# Patient Record
Sex: Female | Born: 1935 | Race: White | Hispanic: No | Smoking: Former smoker
Health system: Southern US, Community
[De-identification: ages and names within clinical notes are randomized; demographics above are authoritative.]

## PROBLEM LIST (undated history)

## (undated) DIAGNOSIS — E559 Vitamin D deficiency, unspecified: Secondary | ICD-10-CM

## (undated) DIAGNOSIS — I739 Peripheral vascular disease, unspecified: Secondary | ICD-10-CM

## (undated) DIAGNOSIS — R32 Unspecified urinary incontinence: Secondary | ICD-10-CM

## (undated) DIAGNOSIS — I350 Nonrheumatic aortic (valve) stenosis: Secondary | ICD-10-CM

## (undated) DIAGNOSIS — R233 Spontaneous ecchymoses: Secondary | ICD-10-CM

## (undated) DIAGNOSIS — I6529 Occlusion and stenosis of unspecified carotid artery: Secondary | ICD-10-CM

## (undated) DIAGNOSIS — E039 Hypothyroidism, unspecified: Secondary | ICD-10-CM

## (undated) DIAGNOSIS — C44311 Basal cell carcinoma of skin of nose: Secondary | ICD-10-CM

## (undated) DIAGNOSIS — I714 Abdominal aortic aneurysm, without rupture, unspecified: Secondary | ICD-10-CM

## (undated) DIAGNOSIS — K08109 Complete loss of teeth, unspecified cause, unspecified class: Secondary | ICD-10-CM

## (undated) DIAGNOSIS — K439 Ventral hernia without obstruction or gangrene: Secondary | ICD-10-CM

## (undated) DIAGNOSIS — R238 Other skin changes: Secondary | ICD-10-CM

## (undated) DIAGNOSIS — I219 Acute myocardial infarction, unspecified: Secondary | ICD-10-CM

## (undated) DIAGNOSIS — I701 Atherosclerosis of renal artery: Secondary | ICD-10-CM

## (undated) DIAGNOSIS — H269 Unspecified cataract: Secondary | ICD-10-CM

## (undated) DIAGNOSIS — Z972 Presence of dental prosthetic device (complete) (partial): Secondary | ICD-10-CM

## (undated) DIAGNOSIS — K219 Gastro-esophageal reflux disease without esophagitis: Secondary | ICD-10-CM

## (undated) DIAGNOSIS — E785 Hyperlipidemia, unspecified: Secondary | ICD-10-CM

## (undated) DIAGNOSIS — F329 Major depressive disorder, single episode, unspecified: Secondary | ICD-10-CM

## (undated) DIAGNOSIS — I2510 Atherosclerotic heart disease of native coronary artery without angina pectoris: Secondary | ICD-10-CM

## (undated) DIAGNOSIS — N189 Chronic kidney disease, unspecified: Secondary | ICD-10-CM

## (undated) DIAGNOSIS — I10 Essential (primary) hypertension: Secondary | ICD-10-CM

## (undated) HISTORY — DX: Unspecified urinary incontinence: R32

## (undated) HISTORY — DX: Gastro-esophageal reflux disease without esophagitis: K21.9

## (undated) HISTORY — DX: Essential (primary) hypertension: I10

## (undated) HISTORY — DX: Presence of dental prosthetic device (complete) (partial): K08.109

## (undated) HISTORY — DX: Other skin changes: R23.8

## (undated) HISTORY — DX: Abdominal aortic aneurysm, without rupture: I71.4

## (undated) HISTORY — DX: Vitamin D deficiency, unspecified: E55.9

## (undated) HISTORY — DX: Ventral hernia without obstruction or gangrene: K43.9

## (undated) HISTORY — DX: Peripheral vascular disease, unspecified: I73.9

## (undated) HISTORY — PX: BREAST SURGERY: SHX581

## (undated) HISTORY — DX: Chronic kidney disease, unspecified: N18.9

## (undated) HISTORY — PX: ANGIOPLASTY: SHX39

## (undated) HISTORY — DX: Atherosclerosis of renal artery: I70.1

## (undated) HISTORY — DX: Acute myocardial infarction, unspecified: I21.9

## (undated) HISTORY — DX: Major depressive disorder, single episode, unspecified: F32.9

## (undated) HISTORY — PX: ILIAC ARTERY STENT: SHX1786

## (undated) HISTORY — PX: CORONARY STENT PLACEMENT: SHX1402

## (undated) HISTORY — DX: Hyperlipidemia, unspecified: E78.5

## (undated) HISTORY — DX: Nonrheumatic aortic (valve) stenosis: I35.0

## (undated) HISTORY — DX: Hypothyroidism, unspecified: E03.9

## (undated) HISTORY — DX: Spontaneous ecchymoses: R23.3

## (undated) HISTORY — DX: Unspecified cataract: H26.9

## (undated) HISTORY — DX: Atherosclerotic heart disease of native coronary artery without angina pectoris: I25.10

## (undated) HISTORY — DX: Basal cell carcinoma of skin of nose: C44.311

## (undated) HISTORY — DX: Abdominal aortic aneurysm, without rupture, unspecified: I71.40

## (undated) HISTORY — PX: ABDOMINAL AORTIC ANEURYSM REPAIR: SUR1152

## (undated) HISTORY — PX: CATARACT EXTRACTION, BILATERAL: SHX1313

## (undated) HISTORY — DX: Presence of dental prosthetic device (complete) (partial): Z97.2

## (undated) HISTORY — DX: Occlusion and stenosis of unspecified carotid artery: I65.29

---
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
## 1985-07-12 HISTORY — PX: CHOLECYSTECTOMY: SHX55

## 1994-07-12 HISTORY — PX: OTHER SURGICAL HISTORY: SHX169

## 1995-07-13 HISTORY — PX: HERNIA REPAIR: SHX51

## 2004-07-12 DIAGNOSIS — F32A Depression, unspecified: Secondary | ICD-10-CM

## 2004-07-12 HISTORY — DX: Depression, unspecified: F32.A

## 2005-04-11 HISTORY — PX: OTHER SURGICAL HISTORY: SHX169

## 2005-04-28 ENCOUNTER — Emergency Department (HOSPITAL_COMMUNITY): Admission: EM | Admit: 2005-04-28 | Discharge: 2005-04-28 | Payer: Self-pay | Admitting: Emergency Medicine

## 2005-05-04 ENCOUNTER — Ambulatory Visit: Payer: Self-pay | Admitting: Internal Medicine

## 2005-05-05 ENCOUNTER — Ambulatory Visit: Payer: Self-pay | Admitting: Family Medicine

## 2005-05-05 ENCOUNTER — Inpatient Hospital Stay (HOSPITAL_COMMUNITY): Admission: EM | Admit: 2005-05-05 | Discharge: 2005-05-12 | Payer: Self-pay | Admitting: Emergency Medicine

## 2005-05-06 ENCOUNTER — Encounter (INDEPENDENT_AMBULATORY_CARE_PROVIDER_SITE_OTHER): Payer: Self-pay | Admitting: *Deleted

## 2005-05-18 ENCOUNTER — Ambulatory Visit: Payer: Self-pay | Admitting: Gastroenterology

## 2005-05-19 ENCOUNTER — Ambulatory Visit: Payer: Self-pay | Admitting: Gastroenterology

## 2005-05-19 ENCOUNTER — Encounter (INDEPENDENT_AMBULATORY_CARE_PROVIDER_SITE_OTHER): Payer: Self-pay | Admitting: *Deleted

## 2005-05-19 DIAGNOSIS — K559 Vascular disorder of intestine, unspecified: Secondary | ICD-10-CM | POA: Insufficient documentation

## 2005-05-28 ENCOUNTER — Ambulatory Visit: Payer: Self-pay | Admitting: Family Medicine

## 2005-07-01 HISTORY — PX: COLONOSCOPY W/ ENDOSCOPIC US: SHX1379

## 2005-07-01 HISTORY — PX: CHOLECYSTECTOMY: SHX55

## 2005-07-07 HISTORY — PX: COLONOSCOPY: SHX174

## 2005-07-07 HISTORY — PX: UPPER GASTROINTESTINAL ENDOSCOPY: SHX188

## 2005-07-12 DIAGNOSIS — I219 Acute myocardial infarction, unspecified: Secondary | ICD-10-CM

## 2005-07-12 HISTORY — DX: Acute myocardial infarction, unspecified: I21.9

## 2005-07-12 HISTORY — PX: CORONARY STENT PLACEMENT: SHX1402

## 2005-07-26 ENCOUNTER — Ambulatory Visit: Payer: Self-pay | Admitting: Cardiology

## 2005-07-27 ENCOUNTER — Inpatient Hospital Stay (HOSPITAL_COMMUNITY): Admission: EM | Admit: 2005-07-27 | Discharge: 2005-07-31 | Payer: Self-pay | Admitting: Emergency Medicine

## 2005-07-27 ENCOUNTER — Ambulatory Visit: Payer: Self-pay | Admitting: Sports Medicine

## 2005-08-13 ENCOUNTER — Ambulatory Visit: Payer: Self-pay | Admitting: Cardiology

## 2005-08-13 ENCOUNTER — Ambulatory Visit: Payer: Self-pay | Admitting: Family Medicine

## 2005-08-13 HISTORY — PX: OTHER SURGICAL HISTORY: SHX169

## 2005-09-07 ENCOUNTER — Encounter: Payer: Self-pay | Admitting: Cardiology

## 2005-09-09 ENCOUNTER — Encounter: Payer: Self-pay | Admitting: Cardiology

## 2005-10-10 ENCOUNTER — Encounter: Payer: Self-pay | Admitting: Cardiology

## 2005-10-21 ENCOUNTER — Ambulatory Visit: Payer: Self-pay | Admitting: Family Medicine

## 2005-11-05 ENCOUNTER — Encounter: Payer: Self-pay | Admitting: Cardiology

## 2005-11-05 ENCOUNTER — Ambulatory Visit: Payer: Self-pay

## 2005-11-09 ENCOUNTER — Encounter: Payer: Self-pay | Admitting: Cardiology

## 2005-11-12 ENCOUNTER — Ambulatory Visit: Payer: Self-pay | Admitting: Family Medicine

## 2005-11-15 ENCOUNTER — Ambulatory Visit: Payer: Self-pay | Admitting: Internal Medicine

## 2005-11-23 ENCOUNTER — Ambulatory Visit: Payer: Self-pay | Admitting: Sports Medicine

## 2005-11-23 ENCOUNTER — Encounter: Admission: RE | Admit: 2005-11-23 | Discharge: 2005-11-23 | Payer: Self-pay | Admitting: Sports Medicine

## 2005-12-08 ENCOUNTER — Ambulatory Visit: Payer: Self-pay | Admitting: Family Medicine

## 2005-12-10 ENCOUNTER — Encounter: Payer: Self-pay | Admitting: Cardiology

## 2005-12-23 ENCOUNTER — Ambulatory Visit: Payer: Self-pay | Admitting: Family Medicine

## 2006-01-09 HISTORY — PX: OTHER SURGICAL HISTORY: SHX169

## 2006-01-25 ENCOUNTER — Inpatient Hospital Stay (HOSPITAL_COMMUNITY): Admission: EM | Admit: 2006-01-25 | Discharge: 2006-01-28 | Payer: Self-pay | Admitting: Emergency Medicine

## 2006-01-25 ENCOUNTER — Ambulatory Visit: Payer: Self-pay | Admitting: Family Medicine

## 2006-02-02 ENCOUNTER — Ambulatory Visit: Payer: Self-pay | Admitting: Family Medicine

## 2006-03-15 ENCOUNTER — Ambulatory Visit: Payer: Self-pay | Admitting: Family Medicine

## 2006-04-13 ENCOUNTER — Ambulatory Visit: Payer: Self-pay | Admitting: Internal Medicine

## 2006-05-24 ENCOUNTER — Ambulatory Visit: Payer: Self-pay

## 2006-06-08 ENCOUNTER — Ambulatory Visit: Payer: Self-pay | Admitting: Family Medicine

## 2006-07-06 ENCOUNTER — Ambulatory Visit: Payer: Self-pay | Admitting: Internal Medicine

## 2006-10-12 ENCOUNTER — Ambulatory Visit: Payer: Self-pay | Admitting: Internal Medicine

## 2006-10-12 ENCOUNTER — Encounter: Payer: Self-pay | Admitting: Cardiology

## 2006-10-12 ENCOUNTER — Ambulatory Visit: Payer: Self-pay

## 2006-10-12 LAB — CONVERTED CEMR LAB
Albumin: 3.8 g/dL (ref 3.5–5.2)
Alkaline Phosphatase: 75 units/L (ref 39–117)
BUN: 26 mg/dL — ABNORMAL HIGH (ref 6–23)
CO2: 29 meq/L (ref 19–32)
Chloride: 110 meq/L (ref 96–112)
Cholesterol: 251 mg/dL (ref 0–200)
Creatinine, Ser: 1.4 mg/dL — ABNORMAL HIGH (ref 0.4–1.2)
Direct LDL: 63.8 mg/dL
GFR calc Af Amer: 47 mL/min
Glucose, Bld: 94 mg/dL (ref 70–99)
HDL: 46.9 mg/dL (ref >39.0)
Potassium: 5.3 meq/L — ABNORMAL HIGH (ref 3.5–5.1)
Sodium: 144 meq/L (ref 135–145)
Total Bilirubin: 0.9 mg/dL (ref 0.3–1.2)
Total Protein: 6.6 g/dL (ref 6.0–8.3)
VLDL: 63 mg/dL — ABNORMAL HIGH (ref 0–40)

## 2006-10-20 ENCOUNTER — Ambulatory Visit: Payer: Self-pay | Admitting: Family Medicine

## 2006-10-20 LAB — CONVERTED CEMR LAB
BUN: 26 mg/dL — ABNORMAL HIGH (ref 6–23)
CO2: 30 meq/L (ref 19–32)
Calcium: 9.6 mg/dL (ref 8.4–10.5)
Chloride: 109 meq/L (ref 96–112)
Creatinine, Ser: 1.3 mg/dL — ABNORMAL HIGH (ref 0.4–1.2)
GFR calc Af Amer: 51 mL/min
GFR calc non Af Amer: 42 mL/min
Glucose, Bld: 135 mg/dL — ABNORMAL HIGH (ref 70–99)
Potassium: 4.9 meq/L (ref 3.5–5.1)
Sodium: 144 meq/L (ref 135–145)

## 2006-11-29 ENCOUNTER — Encounter (INDEPENDENT_AMBULATORY_CARE_PROVIDER_SITE_OTHER): Payer: Self-pay | Admitting: *Deleted

## 2007-01-09 ENCOUNTER — Telehealth: Payer: Self-pay | Admitting: Family Medicine

## 2007-02-17 ENCOUNTER — Encounter: Payer: Self-pay | Admitting: Family Medicine

## 2007-02-17 DIAGNOSIS — E785 Hyperlipidemia, unspecified: Secondary | ICD-10-CM | POA: Insufficient documentation

## 2007-02-17 DIAGNOSIS — N183 Chronic kidney disease, stage 3 (moderate): Secondary | ICD-10-CM

## 2007-02-17 DIAGNOSIS — I10 Essential (primary) hypertension: Secondary | ICD-10-CM

## 2007-02-17 DIAGNOSIS — E039 Hypothyroidism, unspecified: Secondary | ICD-10-CM

## 2007-02-17 DIAGNOSIS — I739 Peripheral vascular disease, unspecified: Secondary | ICD-10-CM

## 2007-02-17 DIAGNOSIS — K219 Gastro-esophageal reflux disease without esophagitis: Secondary | ICD-10-CM

## 2007-02-20 ENCOUNTER — Ambulatory Visit: Payer: Self-pay | Admitting: Family Medicine

## 2007-02-20 DIAGNOSIS — R159 Full incontinence of feces: Secondary | ICD-10-CM | POA: Insufficient documentation

## 2007-02-20 DIAGNOSIS — N812 Incomplete uterovaginal prolapse: Secondary | ICD-10-CM

## 2007-02-21 LAB — CONVERTED CEMR LAB
ALT: 16 U/L
AST: 18 U/L
Albumin: 3.8 g/dL
Alkaline Phosphatase: 65 U/L
BUN: 26 mg/dL — ABNORMAL HIGH
Bilirubin, Direct: 0.1 mg/dL
CO2: 28 meq/L
Calcium: 9.4 mg/dL
Chloride: 106 meq/L
Creatinine, Ser: 1.4 mg/dL — ABNORMAL HIGH
GFR calc Af Amer: 47 mL/min
GFR calc non Af Amer: 39 mL/min
Glucose, Bld: 102 mg/dL — ABNORMAL HIGH
Potassium: 4.7 meq/L
Sodium: 141 meq/L
TSH: 0.92 u[IU]/mL
Total Bilirubin: 1.1 mg/dL
Total Protein: 6.7 g/dL

## 2007-03-06 ENCOUNTER — Encounter: Payer: Self-pay | Admitting: Family Medicine

## 2007-04-10 ENCOUNTER — Telehealth: Payer: Self-pay | Admitting: Family Medicine

## 2007-05-22 ENCOUNTER — Ambulatory Visit: Payer: Self-pay | Admitting: Family Medicine

## 2007-05-23 ENCOUNTER — Ambulatory Visit: Payer: Self-pay

## 2007-06-14 ENCOUNTER — Encounter: Payer: Self-pay | Admitting: Family Medicine

## 2007-06-23 ENCOUNTER — Ambulatory Visit: Payer: Self-pay | Admitting: Internal Medicine

## 2007-08-18 ENCOUNTER — Ambulatory Visit: Payer: Self-pay

## 2007-08-24 ENCOUNTER — Encounter: Payer: Self-pay | Admitting: Family Medicine

## 2007-08-25 ENCOUNTER — Ambulatory Visit: Payer: Self-pay | Admitting: Family Medicine

## 2007-08-28 LAB — CONVERTED CEMR LAB
ALT: 16 units/L (ref 0–35)
AST: 18 units/L (ref 0–37)
Albumin: 3.7 g/dL (ref 3.5–5.2)
Alkaline Phosphatase: 66 units/L (ref 39–117)
BUN: 24 mg/dL — ABNORMAL HIGH (ref 6–23)
Bilirubin, Direct: 0.1 mg/dL (ref 0.0–0.3)
CO2: 30 meq/L (ref 19–32)
Calcium: 9.5 mg/dL (ref 8.4–10.5)
Chloride: 107 meq/L (ref 96–112)
Direct LDL: 71.8 mg/dL
GFR calc Af Amer: 56 mL/min
GFR calc non Af Amer: 46 mL/min
Glucose, Bld: 99 mg/dL (ref 70–99)
HDL: 46.1 mg/dL (ref >39.0)
Hemoglobin: 13.2 g/dL (ref 12.0–15.0)
Phosphorus: 3.5 mg/dL (ref 2.3–4.6)
Potassium: 4.2 meq/L (ref 3.5–5.1)
Sodium: 143 meq/L (ref 135–145)
Total Bilirubin: 0.9 mg/dL (ref 0.3–1.2)
Total CHOL/HDL Ratio: 6
Total Protein: 6.2 g/dL (ref 6.0–8.3)
Triglycerides: 278 mg/dL (ref 0–149)
VLDL: 56 mg/dL — ABNORMAL HIGH (ref 0–40)

## 2007-11-22 ENCOUNTER — Ambulatory Visit: Payer: Self-pay

## 2007-12-20 ENCOUNTER — Ambulatory Visit: Payer: Self-pay | Admitting: Family Medicine

## 2007-12-20 DIAGNOSIS — M549 Dorsalgia, unspecified: Secondary | ICD-10-CM | POA: Insufficient documentation

## 2007-12-20 LAB — CONVERTED CEMR LAB
Bilirubin Urine: NEGATIVE
Glucose, Urine, Semiquant: NEGATIVE
Ketones, urine, test strip: NEGATIVE
Nitrite: NEGATIVE
Specific Gravity, Urine: 1.02
Urobilinogen, UA: 0.2
pH: 5

## 2007-12-21 ENCOUNTER — Encounter: Payer: Self-pay | Admitting: Family Medicine

## 2007-12-25 ENCOUNTER — Telehealth: Payer: Self-pay | Admitting: Family Medicine

## 2008-01-16 ENCOUNTER — Ambulatory Visit: Payer: Self-pay | Admitting: Family Medicine

## 2008-01-16 DIAGNOSIS — R1031 Right lower quadrant pain: Secondary | ICD-10-CM

## 2008-01-17 ENCOUNTER — Encounter: Payer: Self-pay | Admitting: Family Medicine

## 2008-01-19 ENCOUNTER — Encounter: Payer: Self-pay | Admitting: Family Medicine

## 2008-01-19 LAB — CONVERTED CEMR LAB
BUN: 24 mg/dL — ABNORMAL HIGH (ref 6–23)
Basophils Absolute: 0.1 10*3/uL (ref 0.0–0.1)
CO2: 28 meq/L (ref 19–32)
Chloride: 104 meq/L (ref 96–112)
Eosinophils Absolute: 0.1 10*3/uL (ref 0.0–0.7)
Eosinophils Relative: 1.8 % (ref 0.0–5.0)
GFR calc Af Amer: 51 mL/min
GFR calc non Af Amer: 42 mL/min
Lymphocytes Relative: 23.2 % (ref 12.0–46.0)
MCHC: 35.1 g/dL (ref 30.0–36.0)
MCV: 87.2 fL (ref 78.0–100.0)
Monocytes Absolute: 0.5 10*3/uL (ref 0.1–1.0)
Neutrophils Relative %: 66.6 % (ref 43.0–77.0)
Platelets: 146 10*3/uL — ABNORMAL LOW (ref 150–400)
Potassium: 5 meq/L (ref 3.5–5.1)
RDW: 13.9 % (ref 11.5–14.6)
Sodium: 140 meq/L (ref 135–145)

## 2008-01-25 ENCOUNTER — Telehealth: Payer: Self-pay | Admitting: Family Medicine

## 2008-01-31 ENCOUNTER — Encounter: Payer: Self-pay | Admitting: Family Medicine

## 2008-02-01 DIAGNOSIS — J984 Other disorders of lung: Secondary | ICD-10-CM | POA: Insufficient documentation

## 2008-02-02 ENCOUNTER — Ambulatory Visit: Payer: Self-pay | Admitting: Family Medicine

## 2008-02-15 ENCOUNTER — Encounter: Payer: Self-pay | Admitting: Family Medicine

## 2008-02-16 ENCOUNTER — Telehealth: Payer: Self-pay | Admitting: Family Medicine

## 2008-03-12 ENCOUNTER — Encounter: Payer: Self-pay | Admitting: Family Medicine

## 2008-03-13 ENCOUNTER — Telehealth: Payer: Self-pay | Admitting: Family Medicine

## 2008-03-29 ENCOUNTER — Encounter: Payer: Self-pay | Admitting: Family Medicine

## 2008-04-18 ENCOUNTER — Telehealth: Payer: Self-pay | Admitting: Family Medicine

## 2008-05-16 ENCOUNTER — Ambulatory Visit: Payer: Self-pay | Admitting: Family Medicine

## 2008-05-16 DIAGNOSIS — K439 Ventral hernia without obstruction or gangrene: Secondary | ICD-10-CM | POA: Insufficient documentation

## 2008-05-31 ENCOUNTER — Ambulatory Visit: Payer: Self-pay | Admitting: Cardiology

## 2008-06-03 ENCOUNTER — Ambulatory Visit: Payer: Self-pay | Admitting: Internal Medicine

## 2008-06-03 ENCOUNTER — Ambulatory Visit: Payer: Self-pay | Admitting: Cardiology

## 2008-06-11 ENCOUNTER — Ambulatory Visit (HOSPITAL_COMMUNITY): Admission: RE | Admit: 2008-06-11 | Discharge: 2008-06-11 | Payer: Self-pay | Admitting: Internal Medicine

## 2008-06-11 ENCOUNTER — Ambulatory Visit: Payer: Self-pay | Admitting: Internal Medicine

## 2008-06-17 ENCOUNTER — Ambulatory Visit: Payer: Self-pay | Admitting: Family Medicine

## 2008-06-18 ENCOUNTER — Encounter: Payer: Self-pay | Admitting: Family Medicine

## 2008-06-18 LAB — CONVERTED CEMR LAB
ALT: 18 units/L (ref 0–35)
AST: 20 units/L (ref 0–37)
Albumin: 3.8 g/dL (ref 3.5–5.2)
Alkaline Phosphatase: 61 units/L (ref 39–117)
BUN: 16 mg/dL (ref 6–23)
Chloride: 107 meq/L (ref 96–112)
Cholesterol: 285 mg/dL (ref 0–200)
Creatinine, Ser: 1.5 mg/dL — ABNORMAL HIGH (ref 0.4–1.2)
Direct LDL: 93.2 mg/dL
GFR calc Af Amer: 43 mL/min
GFR calc non Af Amer: 36 mL/min
Glucose, Bld: 104 mg/dL — ABNORMAL HIGH (ref 70–99)
HDL: 42.7 mg/dL (ref >39.0)
Sodium: 142 meq/L (ref 135–145)
Total Bilirubin: 0.9 mg/dL (ref 0.3–1.2)
Total CHOL/HDL Ratio: 6.7
Total Protein: 6.8 g/dL (ref 6.0–8.3)
Triglycerides: 437 mg/dL (ref 0–149)
VLDL: 87 mg/dL — ABNORMAL HIGH (ref 0–40)

## 2008-06-21 ENCOUNTER — Ambulatory Visit: Payer: Self-pay | Admitting: Family Medicine

## 2008-06-21 DIAGNOSIS — I2510 Atherosclerotic heart disease of native coronary artery without angina pectoris: Secondary | ICD-10-CM

## 2008-06-21 DIAGNOSIS — R1013 Epigastric pain: Secondary | ICD-10-CM

## 2008-06-21 LAB — CONVERTED CEMR LAB
Cholesterol, target level: 200 mg/dL
HDL goal, serum: 40 mg/dL
LDL Goal: 100 mg/dL

## 2008-06-21 LAB — HM COLONOSCOPY

## 2008-06-24 LAB — CONVERTED CEMR LAB: Lipase: 33 units/L (ref 11.0–59.0)

## 2008-06-25 ENCOUNTER — Ambulatory Visit: Payer: Self-pay

## 2008-06-25 ENCOUNTER — Encounter: Payer: Self-pay | Admitting: Cardiology

## 2008-06-27 ENCOUNTER — Ambulatory Visit: Payer: Self-pay | Admitting: Internal Medicine

## 2008-07-08 ENCOUNTER — Encounter: Payer: Self-pay | Admitting: Family Medicine

## 2008-07-24 ENCOUNTER — Ambulatory Visit: Payer: Self-pay | Admitting: Family Medicine

## 2008-07-24 DIAGNOSIS — M545 Low back pain: Secondary | ICD-10-CM

## 2008-09-24 ENCOUNTER — Encounter (INDEPENDENT_AMBULATORY_CARE_PROVIDER_SITE_OTHER): Payer: Self-pay | Admitting: *Deleted

## 2008-09-24 DIAGNOSIS — I714 Abdominal aortic aneurysm, without rupture: Secondary | ICD-10-CM

## 2008-09-25 ENCOUNTER — Encounter: Payer: Self-pay | Admitting: Internal Medicine

## 2008-09-25 ENCOUNTER — Ambulatory Visit: Payer: Self-pay | Admitting: Internal Medicine

## 2008-09-25 DIAGNOSIS — I350 Nonrheumatic aortic (valve) stenosis: Secondary | ICD-10-CM

## 2008-09-27 ENCOUNTER — Encounter: Payer: Self-pay | Admitting: Family Medicine

## 2008-10-07 ENCOUNTER — Telehealth: Payer: Self-pay | Admitting: Family Medicine

## 2008-10-23 ENCOUNTER — Encounter: Payer: Self-pay | Admitting: Internal Medicine

## 2008-10-23 ENCOUNTER — Encounter: Payer: Self-pay | Admitting: Family Medicine

## 2008-10-31 ENCOUNTER — Encounter: Payer: Self-pay | Admitting: Internal Medicine

## 2008-11-01 ENCOUNTER — Ambulatory Visit: Payer: Self-pay | Admitting: Internal Medicine

## 2008-11-01 DIAGNOSIS — I6529 Occlusion and stenosis of unspecified carotid artery: Secondary | ICD-10-CM | POA: Insufficient documentation

## 2008-11-09 ENCOUNTER — Encounter: Payer: Self-pay | Admitting: Internal Medicine

## 2008-12-10 ENCOUNTER — Encounter: Payer: Self-pay | Admitting: Internal Medicine

## 2008-12-10 ENCOUNTER — Ambulatory Visit: Payer: Self-pay

## 2008-12-11 ENCOUNTER — Encounter: Payer: Self-pay | Admitting: Family Medicine

## 2008-12-30 ENCOUNTER — Encounter: Payer: Self-pay | Admitting: Internal Medicine

## 2008-12-30 ENCOUNTER — Ambulatory Visit: Payer: Self-pay | Admitting: Internal Medicine

## 2008-12-31 ENCOUNTER — Ambulatory Visit: Payer: Self-pay | Admitting: Family Medicine

## 2008-12-31 LAB — CONVERTED CEMR LAB
Bacteria, UA: 0
Bilirubin Urine: NEGATIVE
Glucose, Urine, Semiquant: NEGATIVE
Ketones, urine, test strip: NEGATIVE
Nitrite: NEGATIVE
Protein, U semiquant: 30
Urobilinogen, UA: 0.2
pH: 6

## 2009-01-03 ENCOUNTER — Telehealth: Payer: Self-pay | Admitting: Internal Medicine

## 2009-01-03 ENCOUNTER — Inpatient Hospital Stay (HOSPITAL_COMMUNITY): Admission: EM | Admit: 2009-01-03 | Discharge: 2009-01-07 | Payer: Self-pay | Admitting: Emergency Medicine

## 2009-01-03 ENCOUNTER — Ambulatory Visit: Payer: Self-pay | Admitting: Cardiology

## 2009-01-03 ENCOUNTER — Encounter (INDEPENDENT_AMBULATORY_CARE_PROVIDER_SITE_OTHER): Payer: Self-pay | Admitting: *Deleted

## 2009-01-05 ENCOUNTER — Ambulatory Visit: Payer: Self-pay | Admitting: Internal Medicine

## 2009-01-06 ENCOUNTER — Encounter: Payer: Self-pay | Admitting: Internal Medicine

## 2009-01-07 ENCOUNTER — Encounter (INDEPENDENT_AMBULATORY_CARE_PROVIDER_SITE_OTHER): Payer: Self-pay | Admitting: *Deleted

## 2009-01-07 ENCOUNTER — Telehealth: Payer: Self-pay | Admitting: Internal Medicine

## 2009-01-08 ENCOUNTER — Telehealth: Payer: Self-pay | Admitting: Gastroenterology

## 2009-01-10 ENCOUNTER — Ambulatory Visit: Payer: Self-pay | Admitting: Internal Medicine

## 2009-01-10 ENCOUNTER — Encounter: Payer: Self-pay | Admitting: Internal Medicine

## 2009-01-16 ENCOUNTER — Ambulatory Visit: Payer: Self-pay | Admitting: Gastroenterology

## 2009-01-27 ENCOUNTER — Ambulatory Visit: Payer: Self-pay | Admitting: Family Medicine

## 2009-01-28 ENCOUNTER — Ambulatory Visit: Payer: Self-pay | Admitting: Family Medicine

## 2009-01-30 LAB — CONVERTED CEMR LAB
Cholesterol: 203 mg/dL — ABNORMAL HIGH (ref 0–200)
Direct LDL: 56.4 mg/dL
HDL: 38.7 mg/dL — ABNORMAL LOW (ref >39.00)
Total CHOL/HDL Ratio: 5
Triglycerides: 300 mg/dL — ABNORMAL HIGH (ref 0.0–149.0)
VLDL: 60 mg/dL — ABNORMAL HIGH (ref 0.0–40.0)

## 2009-02-13 ENCOUNTER — Ambulatory Visit: Payer: Self-pay | Admitting: Internal Medicine

## 2009-03-12 ENCOUNTER — Telehealth: Payer: Self-pay | Admitting: Family Medicine

## 2009-04-08 ENCOUNTER — Ambulatory Visit: Payer: Self-pay | Admitting: Family Medicine

## 2009-04-09 ENCOUNTER — Encounter: Payer: Self-pay | Admitting: Family Medicine

## 2009-05-06 ENCOUNTER — Encounter: Payer: Self-pay | Admitting: Family Medicine

## 2009-05-19 ENCOUNTER — Encounter: Payer: Self-pay | Admitting: Family Medicine

## 2009-05-19 ENCOUNTER — Encounter: Payer: Self-pay | Admitting: Internal Medicine

## 2009-06-11 ENCOUNTER — Ambulatory Visit: Payer: Self-pay | Admitting: Internal Medicine

## 2009-06-13 ENCOUNTER — Encounter: Payer: Self-pay | Admitting: Internal Medicine

## 2009-06-23 ENCOUNTER — Encounter: Payer: Self-pay | Admitting: Family Medicine

## 2009-06-24 ENCOUNTER — Telehealth: Payer: Self-pay | Admitting: Family Medicine

## 2009-06-24 ENCOUNTER — Ambulatory Visit: Payer: Self-pay | Admitting: Family Medicine

## 2009-06-26 LAB — CONVERTED CEMR LAB
ALT: 18 units/L (ref 0–35)
AST: 19 units/L (ref 0–37)
Albumin: 3.7 g/dL (ref 3.5–5.2)
Alkaline Phosphatase: 57 units/L (ref 39–117)
BUN: 21 mg/dL (ref 6–23)
CO2: 29 meq/L (ref 19–32)
Calcium: 9.7 mg/dL (ref 8.4–10.5)
Chloride: 106 meq/L (ref 96–112)
Cholesterol: 195 mg/dL (ref 0–200)
Creatinine, Ser: 1.4 mg/dL — ABNORMAL HIGH (ref 0.4–1.2)
Direct LDL: 57.1 mg/dL
GFR calc non Af Amer: 38.49 mL/min (ref >60)
Glucose, Bld: 94 mg/dL (ref 70–99)
Potassium: 5 meq/L (ref 3.5–5.1)
TSH: 2.52 microintl units/mL (ref 0.35–5.50)
Total Bilirubin: 0.8 mg/dL (ref 0.3–1.2)
Total CHOL/HDL Ratio: 5
Total Protein: 6.3 g/dL (ref 6.0–8.3)
Triglycerides: 277 mg/dL — ABNORMAL HIGH (ref 0.0–149.0)
VLDL: 55.4 mg/dL — ABNORMAL HIGH (ref 0.0–40.0)

## 2009-07-02 ENCOUNTER — Encounter: Payer: Self-pay | Admitting: Family Medicine

## 2009-07-07 ENCOUNTER — Encounter: Payer: Self-pay | Admitting: Family Medicine

## 2009-07-08 ENCOUNTER — Telehealth: Payer: Self-pay | Admitting: Internal Medicine

## 2009-07-08 ENCOUNTER — Ambulatory Visit: Payer: Self-pay | Admitting: Family Medicine

## 2009-07-09 ENCOUNTER — Encounter: Payer: Self-pay | Admitting: Family Medicine

## 2009-07-10 ENCOUNTER — Encounter: Payer: Self-pay | Admitting: Family Medicine

## 2009-07-29 ENCOUNTER — Ambulatory Visit: Payer: Self-pay | Admitting: Family Medicine

## 2009-10-07 ENCOUNTER — Ambulatory Visit: Payer: Self-pay | Admitting: Family Medicine

## 2009-10-10 LAB — CONVERTED CEMR LAB
Cholesterol: 175 mg/dL (ref 0–200)
Direct LDL: 50.5 mg/dL
HDL: 48.7 mg/dL (ref >39.00)
Total CHOL/HDL Ratio: 4
Triglycerides: 211 mg/dL — ABNORMAL HIGH (ref 0.0–149.0)
VLDL: 42.2 mg/dL — ABNORMAL HIGH (ref 0.0–40.0)

## 2009-10-16 ENCOUNTER — Encounter: Payer: Self-pay | Admitting: Internal Medicine

## 2009-10-16 ENCOUNTER — Ambulatory Visit: Payer: Self-pay

## 2009-11-21 ENCOUNTER — Encounter: Payer: Self-pay | Admitting: Family Medicine

## 2009-12-14 ENCOUNTER — Emergency Department (HOSPITAL_COMMUNITY): Admission: EM | Admit: 2009-12-14 | Discharge: 2009-12-14 | Payer: Self-pay | Admitting: Emergency Medicine

## 2009-12-15 ENCOUNTER — Telehealth: Payer: Self-pay | Admitting: Family Medicine

## 2009-12-19 ENCOUNTER — Ambulatory Visit: Payer: Self-pay | Admitting: Family Medicine

## 2009-12-23 ENCOUNTER — Ambulatory Visit: Payer: Self-pay | Admitting: Family Medicine

## 2010-02-18 ENCOUNTER — Encounter: Payer: Self-pay | Admitting: Family Medicine

## 2010-03-04 ENCOUNTER — Ambulatory Visit: Payer: Self-pay | Admitting: Internal Medicine

## 2010-04-13 ENCOUNTER — Encounter: Payer: Self-pay | Admitting: Gastroenterology

## 2010-04-21 ENCOUNTER — Encounter: Payer: Self-pay | Admitting: Internal Medicine

## 2010-04-21 ENCOUNTER — Ambulatory Visit: Payer: Self-pay

## 2010-04-28 ENCOUNTER — Encounter: Payer: Self-pay | Admitting: Internal Medicine

## 2010-05-01 ENCOUNTER — Ambulatory Visit: Payer: Self-pay | Admitting: Internal Medicine

## 2010-05-27 ENCOUNTER — Encounter: Payer: Self-pay | Admitting: Family Medicine

## 2010-06-12 ENCOUNTER — Ambulatory Visit: Payer: Self-pay | Admitting: Internal Medicine

## 2010-06-12 DIAGNOSIS — R0602 Shortness of breath: Secondary | ICD-10-CM | POA: Insufficient documentation

## 2010-06-22 ENCOUNTER — Encounter: Payer: Self-pay | Admitting: Internal Medicine

## 2010-06-23 ENCOUNTER — Telehealth: Payer: Self-pay | Admitting: Family Medicine

## 2010-06-29 ENCOUNTER — Ambulatory Visit: Payer: Self-pay | Admitting: Family Medicine

## 2010-06-29 ENCOUNTER — Encounter: Payer: Self-pay | Admitting: Family Medicine

## 2010-07-01 ENCOUNTER — Encounter: Payer: Self-pay | Admitting: Internal Medicine

## 2010-07-03 ENCOUNTER — Ambulatory Visit: Payer: Self-pay | Admitting: Family Medicine

## 2010-07-07 ENCOUNTER — Telehealth: Payer: Self-pay | Admitting: Internal Medicine

## 2010-07-07 LAB — CONVERTED CEMR LAB
AST: 19 units/L (ref 0–37)
Albumin: 3.8 g/dL (ref 3.5–5.2)
Alkaline Phosphatase: 58 units/L (ref 39–117)
Calcium: 9.5 mg/dL (ref 8.4–10.5)
Direct LDL: 104.7 mg/dL
Eosinophils Relative: 1.9 % (ref 0.0–5.0)
GFR calc non Af Amer: 25.73 mL/min — ABNORMAL LOW (ref >60.00)
HCT: 39.4 % (ref 36.0–46.0)
HDL: 47 mg/dL (ref >39.00)
Hemoglobin: 13.3 g/dL (ref 12.0–15.0)
Lymphs Abs: 2.9 10*3/uL (ref 0.7–4.0)
MCV: 89.2 fL (ref 78.0–100.0)
Monocytes Absolute: 0.6 10*3/uL (ref 0.1–1.0)
Monocytes Relative: 7.8 % (ref 3.0–12.0)
Neutro Abs: 4.2 10*3/uL (ref 1.4–7.7)
Platelets: 176 10*3/uL (ref 150.0–400.0)
Potassium: 4.8 meq/L (ref 3.5–5.1)
Sodium: 140 meq/L (ref 135–145)
TSH: 15.42 microintl units/mL — ABNORMAL HIGH (ref 0.35–5.50)
Total Bilirubin: 0.8 mg/dL (ref 0.3–1.2)
Total CHOL/HDL Ratio: 6
VLDL: 52.4 mg/dL — ABNORMAL HIGH (ref 0.0–40.0)
WBC: 7.9 10*3/uL (ref 4.5–10.5)

## 2010-07-09 ENCOUNTER — Encounter (INDEPENDENT_AMBULATORY_CARE_PROVIDER_SITE_OTHER): Payer: Self-pay | Admitting: *Deleted

## 2010-07-10 ENCOUNTER — Ambulatory Visit: Payer: Self-pay | Admitting: Family Medicine

## 2010-07-10 DIAGNOSIS — N179 Acute kidney failure, unspecified: Secondary | ICD-10-CM

## 2010-07-10 DIAGNOSIS — G4700 Insomnia, unspecified: Secondary | ICD-10-CM

## 2010-07-14 LAB — CONVERTED CEMR LAB
Creatinine, Ser: 1.6 mg/dL — ABNORMAL HIGH (ref 0.4–1.2)
GFR calc non Af Amer: 33.14 mL/min — ABNORMAL LOW (ref >60.00)
Glucose, Bld: 99 mg/dL (ref 70–99)
Phosphorus: 2.8 mg/dL (ref 2.3–4.6)
Potassium: 5.6 meq/L — ABNORMAL HIGH (ref 3.5–5.1)
Sodium: 141 meq/L (ref 135–145)

## 2010-07-16 ENCOUNTER — Other Ambulatory Visit: Payer: Self-pay | Admitting: Family Medicine

## 2010-07-16 ENCOUNTER — Ambulatory Visit
Admission: RE | Admit: 2010-07-16 | Discharge: 2010-07-16 | Payer: Self-pay | Source: Home / Self Care | Attending: Family Medicine | Admitting: Family Medicine

## 2010-07-17 LAB — BASIC METABOLIC PANEL
BUN: 23 mg/dL (ref 6–23)
CO2: 31 mEq/L (ref 19–32)
Calcium: 9.3 mg/dL (ref 8.4–10.5)
Chloride: 105 mEq/L (ref 96–112)
Creatinine, Ser: 1.5 mg/dL — ABNORMAL HIGH (ref 0.4–1.2)
GFR: 36 mL/min — ABNORMAL LOW (ref >60.00)
Glucose, Bld: 81 mg/dL (ref 70–99)
Potassium: 4.4 mEq/L (ref 3.5–5.1)
Sodium: 144 mEq/L (ref 135–145)

## 2010-07-28 ENCOUNTER — Encounter (INDEPENDENT_AMBULATORY_CARE_PROVIDER_SITE_OTHER): Payer: Self-pay | Admitting: *Deleted

## 2010-07-28 ENCOUNTER — Encounter: Payer: Self-pay | Admitting: Family Medicine

## 2010-07-28 LAB — CONVERTED CEMR LAB
ALT: 13 units/L
Ferritin: 64 ng/mL
Platelets: 191 10*3/uL
Saturation Ratios: 23 %
WBC: 76 10*3/uL

## 2010-07-31 ENCOUNTER — Ambulatory Visit
Admission: RE | Admit: 2010-07-31 | Discharge: 2010-07-31 | Payer: Self-pay | Source: Home / Self Care | Attending: Internal Medicine | Admitting: Internal Medicine

## 2010-08-07 ENCOUNTER — Ambulatory Visit
Admission: RE | Admit: 2010-08-07 | Discharge: 2010-08-07 | Payer: Self-pay | Source: Home / Self Care | Attending: Family Medicine | Admitting: Family Medicine

## 2010-08-07 ENCOUNTER — Other Ambulatory Visit: Payer: Self-pay | Admitting: Family Medicine

## 2010-08-07 LAB — TSH: TSH: 0.31 u[IU]/mL — ABNORMAL LOW (ref 0.35–5.50)

## 2010-08-11 NOTE — Progress Notes (Signed)
Summary: Head congestion, deep cough...  Phone Note Call from Patient   Summary of Call: Daughter [name]) called. Pt has a deep cough and head congestion. Not able to come into the office . Pt went to the ER on Sunday because of a fall, she is now in a leg brace and having problems getting around.  Called back [name], Pharmacy=[facility].Daine Gip  December 15, 2009 9:35 AM  Initial call taken by: Daine Gip,  December 15, 2009 9:35 AM  Follow-up for Phone Call        Discuss cold care  Rec plain mucinex over the counter, chloraseptic spray. Expect for symptoms to last 7-10 days with some cough symptoms lasting after this. Follow-up by: Hannah Beat MD,  December 15, 2009 9:40 AM  Additional Follow-up for Phone Call Additional follow up Details #1::        Patients daughter says that patient has a very deep cough and it she is scared that she is going to get Pneumonia not moving around as much.[name] CMA     Additional Follow-up for Phone Call Additional follow up Details #2::    It is possible - I cannot diagnose pneumonia over the phone and cannot call in antibiotics. f/u with Dr. Ermalene Searing tomorrow. Follow-up by: Hannah Beat MD,  December 15, 2009 10:17 AM  Additional Follow-up for Phone Call Additional follow up Details #3:: Details for Additional Follow-up Action Taken: daugther notified.[name] CMA  Additional Follow-up by: [name] CMA Duncan Dull),  December 15, 2009 10:23 AM

## 2010-08-11 NOTE — Letter (Signed)
Summary: [employer], [facility] at [address]   [place], [place] [address]   Phone: [phone]  Fax: [phone]      April 28, 2010 MRN: [id number]   [facility] [address] [place], [place]  [address]   Dear Ms. Lei,  Your test ordered by Selena Batten has been reviewed by your physician (or physician assistant) and was found to be normal or stable. Your physician (or physician assistant) felt no changes were needed at this time.  __x__ Echocardiogram  ____ Cardiac Stress Test  ____ Lab Work  ____ Peripheral vascular study of arms, legs or neck  ____ CT scan or X-ray  ____ Lung or Breathing test  ____ Other:   Thank you.   Benedict Needy, RN    Arvilla Meres, MD, F.A.C.C

## 2010-08-11 NOTE — Letter (Signed)
Summary: [facility]   Imported By: Maryln Gottron 06/08/2010 12:47:27  _____________________________________________________________________  External Attachment:    Type:   Image     Comment:   External Document

## 2010-08-11 NOTE — Assessment & Plan Note (Signed)
Summary: COUGH, CONGESTION   Vital Signs:  Patient profile:   77 year old female Height:      66 inches Weight:      173.2 pounds BMI:     28.06 O2 Sat:      96 % on Room air Temp:     97.9 degrees F oral Pulse rate:   72 / minute Pulse rhythm:   regular Resp:     16 per minute BP sitting:   130 / 80  (left arm) Cuff size:   regular  Vitals Entered By: [name] CMA (AAMA) (December 19, 2009 3:08 PM)  O2 Flow:  Room air  History of Present Illness: Chief complaint cough,congestion  Recent  ER vist for fall, tripped over neighboors  dog. Injured right knee..X-rays negative. Told ligamentous injury/bone bruise by ORTHO Dr. Devonne Doughty. Place in brace, wear  in not resolving in 2 weeks. Slowly improving but limited in mobility.  Scrathy throat, nasal discharge. Cough, productive white cream, congestion x 1 week. Minimal improvement. No fever.  Some SOB, no wheeze. Has been around exterminator recently. No ear pain, no face pain.  Using OTC mucinex..breasking it up some.  Using prednisone as needed for gout..using as needed.     Problems Prior to Update: 1)  Senile Osteoporosis  (ICD-733.01) 2)  Ischemic Colitis  (ICD-557.9) 3)  Abdominal Pain, Epigastric  (ICD-789.06) 4)  Uti  (ICD-599.0) 5)  Carotid Artery Stenosis  (ICD-433.10) 6)  Aortic Stenosis/ Insufficiency, Non-rheumatic  (ICD-424.1) 7)  Abdominal Aortic Aneurysm  (ICD-441.4) 8)  Low Back Pain, Mild  (ICD-724.2) 9)  Cad  (ICD-414.00) 10)  Special Screening For Osteoporosis  (ICD-V82.81) 11)  Epigastric Pain  (ICD-789.06) 12)  Ventral Hernia  (ICD-553.20) 13)  Pulmonary Nodule, Right Middle Lobe  (ICD-518.89) 14)  Rlq Pain  (ICD-789.03) 15)  Back Pain, Acute  (ICD-724.5) 16)  Symptom, Incontinence, Feces  (ICD-787.6) 17)  Cystocele With Incomplete Uterine Prolapse  (ICD-618.2) 18)  Renal Insufficiency  (ICD-588.9) 19)  Peripheral Vascular Disease  (ICD-443.9) 20)  Hypothyroidism  (ICD-244.9) 21)   Hypertension  (ICD-401.9) 22)  Hyperlipidemia  (ICD-272.4) 23)  Gerd  (ICD-530.81)  Current Medications (verified): 1)  Plavix 75 Mg Tabs (Clopidogrel Bisulfate) .... Take 1 Tablet By Mouth Once A Day 2)  Pantoprazole Sodium 40 Mg Tbec (Pantoprazole Sodium) .Marland Kitchen.. 1 Tab By Mouth Daily 3)  Lipitor 80 Mg  Tabs (Atorvastatin Calcium) .... Take 1 Tablet By Mouth Once A Day 4)  Aspirin Ec 81 Mg Tbec (Aspirin) .Marland Kitchen.. 1 Tablet By Mouth Once A Day 5)  Synthroid 100 Mcg Tabs (Levothyroxine Sodium) .... Take 1 Tablet By Mouth Once A Day 6)  Metoprolol Tartrate 25 Mg Tabs (Metoprolol Tartrate) .... Take 1/2 Tablet By Mouth Twice A Day 7)  Caltrate 600 1500 Mg Tabs (Calcium Carbonate) .... Take 1 Tablet By Mouth Once A Day 8)  Lisinopril 10 Mg  Tabs (Lisinopril) .... Take 1 Tablet By Mouth Once A Day 9)  Isosorbide Mononitrate Cr 30 Mg Xr24h-Tab (Isosorbide Mononitrate) .... Take Two Tablets By Mouth Daily 10)  Voltaren 1 % Gel (Diclofenac Sodium) .... As Needed 11)  Prednisone 5 Mg Tabs (Prednisone) .... One Tablet Daily For 1 Month 12)  Uloric 40 Mg Tabs (Febuxostat) .... Once Tablet Daily For 2 Weeks  Allergies: 1)  ! Codeine 2)  ! Sulfa 3)  ! Morphine Sulfate Cr (Morphine Sulfate) 4)  ! Naproxen (Naproxen) 5)  ! * Fish Oil  Past History:  Past  medical, surgical, family and social histories (including risk factors) reviewed, and no changes noted (except as noted below).  Past Medical History: Reviewed history from 06/11/2009 and no changes required.  1. CAD     a. s/p NSTEMI 1/07 with BMS to OM     b. cath 12/09 due to positive Myoview           -LM ok. LAD 40%.  LCX 80-90% mid. RCA chronically occluded with R->L collats  2. Moderate aortic stenosis     a. Echo 12/09. Moderate AS mean 19. AVA 0.88  3. AAA, recurrent     a. s/p repair. now 3.8 cm     b. h/o ischemic bowel 2006  4. PAD     a. s/p iliac stents  5. Carotid artery stenosis       a. R 60-79% L 0-39%  (6/10) - stable  6.  Renal artery stenosis     a. atrophic L kidney. moderate blockage on R  7. HTN  8. Hyperlipidemia     a. increased LFTs on Vytorin. Changed to Lipitor  9. Mild CRI (Cr 1.3)    10. Hyperparathyroidism 11. Basal cell CA on nose 1980s 12. Hypothyroidism 13. GERD 14. Depression with death of spouse 2006 15. Moderate-sized ventral hernia  Past Surgical History: Reviewed history from 02/17/2007 and no changes required. AAA repair 1995 - 07/01/2005, angioplasty  2005 - 07/01/2005, Appendectomy - 07/01/2005, b iliac stents 2005 - 07/01/2005, cholecystectomy - 07/01/2005, colonoscopy: colonic erosions, ulcers proctitis - 07/07/2005, endoscopy nml - 07/07/2005, NSTEMI 1/07 s/p stent in circumflex - 08/13/2005, [place] l kidney diffuse atrophy - 07/01/2005 1987    Gallbladder 1996    Breast biopsy 1954    Appendectomy 1943    Tonsillectomy 1997    Abd. hernia repair 2005     Stent placement, both legs - PVD in arteries 04/2005 Hosp. ? abd. prob. (mesenteric ischemia) 07/2005   MI  - stent placed 01/2006   Hosp for reflux (chest pain)  Family History: Reviewed history from 01/16/2009 and no changes required. Father: Died 2 MI, throat CA Mother: Died 72, MI Siblings: 1 brother, 2 sisters No MI <55 No FH of Colon Cancer: Breast Cancer: Sister   Social History: Reviewed history from 02/17/2007 and no changes required. husband deceased May 16, 2023 from multiple myeloma, lives with daughter in [place], no tob (former smoker) no ETOH, no IVDU Alcohol use-no Drug use-no  Review of Systems General:  Denies fatigue and fever. CV:  Denies chest pain or discomfort. Resp:  Complains of shortness of breath. GI:  Denies abdominal pain. GU:  Denies dysuria.  Physical Exam  General:  Elderly female in NAD  Ears:  External ear exam shows no significant lesions or deformities.  Otoscopic examination reveals clear canals, tympanic membranes are intact bilaterally without bulging, retraction, inflammation or  discharge. Hearing is grossly normal bilaterally. Nose:  nasal dischargemucosal pallor.   Mouth:  Oral mucosa and oropharynx without lesions or exudates.  Teeth in good repair. Neck:  no carotid bruit or thyromegaly no cervical or supraclavicular lymphadenopathy  Lungs:  diffuse rhonichi, no wheeze, good airmovement moist harsh cough.  Heart:  Normal rate and regular rhythm. S1 and S2 normal without gallop, murmur, click, rub or other extra sounds. Pulses:  R and L posterior tibial pulses are full and equal bilaterally  Extremities:  no edema   Impression & Recommendations:  Problem # 1:  PNEUMONIA (ICD-486) Treat with doxycycline. No clear need for hospitalization.Marland Kitchenas  no hypoxia, tolerating by mouth. Currently living with sister who is able to keep an eye on her. Close follow up. Go to ER if severe SOB.  Her updated medication list for this problem includes:    Doxycycline Hyclate 100 Mg Tabs (Doxycycline hyclate) .Marland Kitchen... 1 tab by mouth two times a day x 10 days  Complete Medication List: 1)  Plavix 75 Mg Tabs (Clopidogrel bisulfate) .... Take 1 tablet by mouth once a day 2)  Pantoprazole Sodium 40 Mg Tbec (Pantoprazole sodium) .Marland Kitchen.. 1 tab by mouth daily 3)  Lipitor 80 Mg Tabs (Atorvastatin calcium) .... Take 1 tablet by mouth once a day 4)  Aspirin Ec 81 Mg Tbec (Aspirin) .Marland Kitchen.. 1 tablet by mouth once a day 5)  Synthroid 100 Mcg Tabs (Levothyroxine sodium) .... Take 1 tablet by mouth once a day 6)  Metoprolol Tartrate 25 Mg Tabs (Metoprolol tartrate) .... Take 1/2 tablet by mouth twice a day 7)  Caltrate 600 1500 Mg Tabs (Calcium carbonate) .... Take 1 tablet by mouth once a day 8)  Lisinopril 10 Mg Tabs (Lisinopril) .... Take 1 tablet by mouth once a day 9)  Isosorbide Mononitrate Cr 30 Mg Xr24h-tab (Isosorbide mononitrate) .... Take two tablets by mouth daily 10)  Voltaren 1 % Gel (Diclofenac sodium) .... As needed 11)  Prednisone 5 Mg Tabs (Prednisone) .... One tablet daily for 1  month 12)  Uloric 40 Mg Tabs (Febuxostat) .... Once tablet daily for 2 weeks 13)  Doxycycline Hyclate 100 Mg Tabs (Doxycycline hyclate) .Marland Kitchen.. 1 tab by mouth two times a day x 10 days  Patient Instructions: 1)  Move appt for next week closer to Tuesday or Wed.  2)   Push fluids, water. 3)   Call if increasing shortnees of breath, no timproving in 48-72 hours. Go to ER if severe shortness of breath.  Prescriptions: DOXYCYCLINE HYCLATE 100 MG TABS (DOXYCYCLINE HYCLATE) 1 tab by mouth two times a day x 10 days  #20 x 0   Entered and Authorized by:   Kerby Nora MD   Signed by:   Kerby Nora MD on 12/19/2009   Method used:   [facility]        [id number]       [address]       [place], [place]  [address]       Ph: [phone] or [phone]       Fax: [phone]   RxID:   [id number]   Current Allergies (reviewed today): ! CODEINE ! SULFA ! MORPHINE SULFATE CR (MORPHINE SULFATE) ! NAPROXEN (NAPROXEN) ! * FISH OIL

## 2010-08-11 NOTE — Assessment & Plan Note (Signed)
Summary: DISCUSS BONE DENSITY RESULTS/ lb   Vital Signs:  Patient profile:   77 year old female Height:      66 inches Weight:      171.0 pounds BMI:     27.70 Temp:     97.7 degrees F oral Pulse rate:   72 / minute Pulse rhythm:   regular BP sitting:   100 / 68  (left arm) Cuff size:   regular  Vitals Entered By: [name] CMA Duncan Dull) (July 29, 2009 11:05 AM)  History of Present Illness: Chief complaint discuss bone density test  Fosamax in past caused GI discomfort and GERD severe. She is very hesitant about any new med given her one kidney.    Takes caltrate daily.  ? was supposed to restart ergocalciferol per last [place] recs...never did? 05/2009 vit D low.   Problems Prior to Update: 1)  Senile Osteoporosis  (ICD-733.01) 2)  Ischemic Colitis  (ICD-557.9) 3)  Abdominal Pain, Epigastric  (ICD-789.06) 4)  Uti  (ICD-599.0) 5)  Carotid Artery Stenosis  (ICD-433.10) 6)  Aortic Stenosis/ Insufficiency, Non-rheumatic  (ICD-424.1) 7)  Abdominal Aortic Aneurysm  (ICD-441.4) 8)  Low Back Pain, Mild  (ICD-724.2) 9)  Cad  (ICD-414.00) 10)  Special Screening For Osteoporosis  (ICD-V82.81) 11)  Epigastric Pain  (ICD-789.06) 12)  Ventral Hernia  (ICD-553.20) 13)  Pulmonary Nodule, Right Middle Lobe  (ICD-518.89) 14)  Rlq Pain  (ICD-789.03) 15)  Back Pain, Acute  (ICD-724.5) 16)  Symptom, Incontinence, Feces  (ICD-787.6) 17)  Cystocele With Incomplete Uterine Prolapse  (ICD-618.2) 18)  Renal Insufficiency  (ICD-588.9) 19)  Peripheral Vascular Disease  (ICD-443.9) 20)  Hypothyroidism  (ICD-244.9) 21)  Hypertension  (ICD-401.9) 22)  Hyperlipidemia  (ICD-272.4) 23)  Gerd  (ICD-530.81)  Current Medications (verified): 1)  Plavix 75 Mg Tabs (Clopidogrel Bisulfate) .... Take 1 Tablet By Mouth Once A Day 2)  Pantoprazole Sodium 40 Mg Tbec (Pantoprazole Sodium) .Marland Kitchen.. 1 Tab By Mouth Daily 3)  Lipitor 80 Mg  Tabs (Atorvastatin Calcium) .... Take 1 Tablet By Mouth Once A  Day 4)  Aspirin Ec 81 Mg Tbec (Aspirin) .Marland Kitchen.. 1 Tablet By Mouth Once A Day 5)  Synthroid 100 Mcg Tabs (Levothyroxine Sodium) .... Take 1 Tablet By Mouth Once A Day 6)  Metoprolol Tartrate 25 Mg Tabs (Metoprolol Tartrate) .... Take 1/2 Tablet By Mouth Twice A Day 7)  Caltrate 600 1500 Mg Tabs (Calcium Carbonate) .... Take 1 Tablet By Mouth Once A Day 8)  Lisinopril 10 Mg  Tabs (Lisinopril) .... Take 1 Tablet By Mouth Once A Day 9)  Isosorbide Mononitrate Cr 30 Mg Xr24h-Tab (Isosorbide Mononitrate) .... Take Two Tablets By Mouth Daily 10)  Voltaren 1 % Gel (Diclofenac Sodium) .... As Needed 11)  Prednisone 5 Mg Tabs (Prednisone) .... One Tablet Daily For 1 Month 12)  Uloric 40 Mg Tabs (Febuxostat) .... Once Tablet Daily For 2 Weeks  Allergies: 1)  ! Codeine 2)  ! Sulfa 3)  ! Morphine Sulfate Cr (Morphine Sulfate) 4)  ! Naproxen (Naproxen) 5)  ! * Fish Oil  Past History:  Past medical, surgical, family and social histories (including risk factors) reviewed, and no changes noted (except as noted below).  Past Medical History: Reviewed history from 06/11/2009 and no changes required.  1. CAD     a. s/p NSTEMI 1/07 with BMS to OM     b. cath 12/09 due to positive Myoview           -LM ok.  LAD 40%.  LCX 80-90% mid. RCA chronically occluded with R->L collats  2. Moderate aortic stenosis     a. Echo 12/09. Moderate AS mean 19. AVA 0.88  3. AAA, recurrent     a. s/p repair. now 3.8 cm     b. h/o ischemic bowel 2006  4. PAD     a. s/p iliac stents  5. Carotid artery stenosis       a. R 60-79% L 0-39%  (6/10) - stable  6. Renal artery stenosis     a. atrophic L kidney. moderate blockage on R  7. HTN  8. Hyperlipidemia     a. increased LFTs on Vytorin. Changed to Lipitor  9. Mild CRI (Cr 1.3)    10. Hyperparathyroidism 11. Basal cell CA on nose 1980s 12. Hypothyroidism 13. GERD 14. Depression with death of spouse 2006 15. Moderate-sized ventral hernia  Past Surgical  History: Reviewed history from 02/17/2007 and no changes required. AAA repair 1995 - 07/01/2005, angioplasty  2005 - 07/01/2005, Appendectomy - 07/01/2005, b iliac stents 2005 - 07/01/2005, cholecystectomy - 07/01/2005, colonoscopy: colonic erosions, ulcers proctitis - 07/07/2005, endoscopy nml - 07/07/2005, NSTEMI 1/07 s/p stent in circumflex - 08/13/2005, [place] l kidney diffuse atrophy - 07/01/2005 1987    Gallbladder 1996    Breast biopsy 1954    Appendectomy 1943    Tonsillectomy 1997    Abd. hernia repair 2005     Stent placement, both legs - PVD in arteries 04/2005 Hosp. ? abd. prob. (mesenteric ischemia) 07/2005   MI  - stent placed 01/2006   Hosp for reflux (chest pain)  Family History: Reviewed history from 01/16/2009 and no changes required. Father: Died 6 MI, throat CA Mother: Died 54, MI Siblings: 1 brother, 2 sisters No MI <55 No FH of Colon Cancer: Breast Cancer: Sister   Social History: Reviewed history from 02/17/2007 and no changes required. husband deceased 05/15/23 from multiple myeloma, lives with daughter in [place], no tob (former smoker) no ETOH, no IVDU Alcohol use-no Drug use-no  Review of Systems       Gotu in left foot improving General:  Denies fatigue and fever. CV:  Denies chest pain or discomfort. Resp:  Denies shortness of breath.  Physical Exam  General:  Well-developed,well-nourished,in no acute distress; alert,appropriate and cooperative throughout examination Mouth:  MMM Msk:  No deformity or scoliosis noted of thoracic or lumbar spine.     Impression & Recommendations:  Problem # 1:  SENILE OSTEOPOROSIS (ICD-733.01) Progressive. Needs to start ergocalciferol back...will call nephrologist to discuss choice of med... IV boniva vs forteo given kidney issues.  Her updated medication list for this problem includes:    Caltrate 600 1500 Mg Tabs (Calcium carbonate) .Marland Kitchen... Take 1 tablet by mouth once a day  Complete Medication List: 1)  Plavix 75 Mg  Tabs (Clopidogrel bisulfate) .... Take 1 tablet by mouth once a day 2)  Pantoprazole Sodium 40 Mg Tbec (Pantoprazole sodium) .Marland Kitchen.. 1 tab by mouth daily 3)  Lipitor 80 Mg Tabs (Atorvastatin calcium) .... Take 1 tablet by mouth once a day 4)  Aspirin Ec 81 Mg Tbec (Aspirin) .Marland Kitchen.. 1 tablet by mouth once a day 5)  Synthroid 100 Mcg Tabs (Levothyroxine sodium) .... Take 1 tablet by mouth once a day 6)  Metoprolol Tartrate 25 Mg Tabs (Metoprolol tartrate) .... Take 1/2 tablet by mouth twice a day 7)  Caltrate 600 1500 Mg Tabs (Calcium carbonate) .... Take 1 tablet by mouth once a day 8)  Lisinopril 10 Mg Tabs (Lisinopril) .... Take 1 tablet by mouth once a day 9)  Isosorbide Mononitrate Cr 30 Mg Xr24h-tab (Isosorbide mononitrate) .... Take two tablets by mouth daily 10)  Voltaren 1 % Gel (Diclofenac sodium) .... As needed 11)  Prednisone 5 Mg Tabs (Prednisone) .... One tablet daily for 1 month 12)  Uloric 40 Mg Tabs (Febuxostat) .... Once tablet daily for 2 weeks  Current Allergies (reviewed today): ! CODEINE ! SULFA ! MORPHINE SULFATE CR (MORPHINE SULFATE) ! NAPROXEN (NAPROXEN) ! * FISH OIL  Appended Document: DISCUSS BONE DENSITY RESULTS/ lb Discussed osteoporosis treatment with Dr. Tyrone Schimke. He recommended prolia 60 mg 1 inj q 6 months for her. Discussed with pt and her daughter Joyce Gross over phone.  They can look into med on website..[email or web address] for more information. They will let me know if they decide to proceed with this medication.  I also re-emphasized importance of weight bearing exercises and restarting ergocalciferol for vit D deficiency.

## 2010-08-11 NOTE — Assessment & Plan Note (Signed)
Summary: 2 month in [place] office/sl   Visit Type:  Follow-up Referring Provider:  n/a Primary Provider:  Kerby Nora MD  CC:  "Doing well.".  History of Present Illness:  [name] is a delightful 77 year old woman with history of coronary artery disease status post non-ST-elevation myocardial iinfarction in January 2007, treated with bare-metal stenting to the obtuse marginal.  She has a totally occluded right coronary with distal collateralization ad signifcant LCX disease which is being treated medically as it is not easily amenable to intervention. She has normal LV function.  Remainder of PMHx notable for: aortic stenosis, peripheral vascular disease with previous abdominal aortic aneurysm repair and bilateral iliac stenting, carotid artery stenosis, renal artery stenosis with an atrophic left kidney and a moderate blockage on the right, hypertension and hyperlipidemia.   She returns today for routine f/u. Feels very good. Remains active but not exercising regularly. No CP. Occasional dyspnea. No HF. No neruo symptoms. Doing many activities with senior citizens group.   Studies:  1) Carotids 10/11: R 60-79% L 0-39% (stable) 2) Echo 10/11: EF 55-65% Mild AS mean gradient 18 mil-mod AI 3) AAA stable 4.4. cm on CT in 02/2009   Current Medications (verified): 1)  Plavix 75 Mg Tabs (Clopidogrel Bisulfate) .... Take 1 Tablet By Mouth Once A Day 2)  Pantoprazole Sodium 40 Mg Tbec (Pantoprazole Sodium) .Marland Kitchen.. 1 Tab By Mouth Daily 3)  Lipitor 80 Mg  Tabs (Atorvastatin Calcium) .... Take 1 Tablet By Mouth Once A Day 4)  Aspirin Ec 81 Mg Tbec (Aspirin) .Marland Kitchen.. 1 Tablet By Mouth Once A Day 5)  Synthroid 100 Mcg Tabs (Levothyroxine Sodium) .... Take 1 Tablet By Mouth Once A Day 6)  Metoprolol Tartrate 25 Mg Tabs (Metoprolol Tartrate) .... Take 1/2 Tablet By Mouth Twice A Day 7)  Caltrate 600 1500 Mg Tabs (Calcium Carbonate) .... Take 2 Tablet By Mouth Two Times A Day 8)  Lisinopril 10 Mg   Tabs (Lisinopril) .... Take 1 Tablet By Mouth Once A Day 9)  Isosorbide Mononitrate Cr 30 Mg Xr24h-Tab (Isosorbide Mononitrate) .... Take Two Tablets By Mouth Daily 10)  Voltaren 1 % Gel (Diclofenac Sodium) .... As Needed 11)  Prednisone 5 Mg Tabs (Prednisone) .... As Needed For Gout 12)  Uloric 40 Mg Tabs (Febuxostat) .... Take 1 Tablet By Mouth Once A Day  Allergies (verified): 1)  ! Codeine 2)  ! Sulfa 3)  ! Morphine Sulfate Cr (Morphine Sulfate) 4)  ! Naproxen (Naproxen) 5)  ! * Fish Oil  Past History:  Past Medical History: Last updated: 06/11/2009  1. CAD     a. s/p NSTEMI 1/07 with BMS to OM     b. cath 12/09 due to positive Myoview           -LM ok. LAD 40%.  LCX 80-90% mid. RCA chronically occluded with R->L collats  2. Moderate aortic stenosis     a. Echo 12/09. Moderate AS mean 19. AVA 0.88  3. AAA, recurrent     a. s/p repair. now 3.8 cm     b. h/o ischemic bowel 2006  4. PAD     a. s/p iliac stents  5. Carotid artery stenosis       a. R 60-79% L 0-39%  (6/10) - stable  6. Renal artery stenosis     a. atrophic L kidney. moderate blockage on R  7. HTN  8. Hyperlipidemia     a. increased LFTs on Vytorin. Changed to Lipitor  9. Mild CRI (Cr 1.3)    10. Hyperparathyroidism 11. Basal cell CA on nose 1980s 12. Hypothyroidism 13. GERD 14. Depression with death of spouse 2006 15. Moderate-sized ventral hernia  Past Surgical History: Last updated: Feb 19, 2007 AAA repair 1995 - 07/01/2005, angioplasty  2005 - 07/01/2005, Appendectomy - 07/01/2005, b iliac stents 2005 - 07/01/2005, cholecystectomy - 07/01/2005, colonoscopy: colonic erosions, ulcers proctitis - 07/07/2005, endoscopy nml - 07/07/2005, NSTEMI 1/07 s/p stent in circumflex - 08/13/2005, [place] l kidney diffuse atrophy - 07/01/2005 1987    Gallbladder 1996    Breast biopsy 1954    Appendectomy 1943    Tonsillectomy 1997    Abd. hernia repair 2005     Stent placement, both legs - PVD in arteries 04/2005 Hosp. ?  abd. prob. (mesenteric ischemia) 07/2005   MI  - stent placed 01/2006   Hosp for reflux (chest pain)  Family History: Last updated: 01/18/2009 Father: Died 63 MI, throat CA Mother: Died 25, MI Siblings: 1 brother, 2 sisters No MI <55 No FH of Colon Cancer: Breast Cancer: Sister   Social History: Last updated: 02-19-07 husband deceased 04-19-2023 from multiple myeloma, lives with daughter in [place], no tob (former smoker) no ETOH, no IVDU Alcohol use-no Drug use-no  Review of Systems       As per HPI and past medical history; otherwise all systems negative.   Vital Signs:  Patient profile:   77 year old female Height:      66 inches Weight:      176 pounds BMI:     28.51 Pulse rate:   50 / minute BP sitting:   134 / 74  (left arm) Cuff size:   regular  Vitals Entered By: Bishop Dublin, CMA (May 01, 2010 10:27 AM)  Physical Exam  General:  Well appearing. no resp difficulty HEENT: normal Neck: supple. no JVD. Carotids 2+ bilat + radiated bruits. No lymphadenopathy or thryomegaly appreciated. Cor: PMI nondisplaced. Regular rate & rhythm. No rubs, gallops,3/6 AS murmur S2  mildly to moderately diminished. i do not hear AI Lungs: clear Abdomen: soft, nontender, nondistended. small ventral hernia Extremities: no cyanosis, clubbing, rash, edema. gouty nodule L 1st toe Neuro: alert & orientedx3, cranial nerves grossly intact. moves all 4 extremities w/o difficulty. affect plea   Impression & Recommendations:  Problem # 1:  CAD (ICD-414.00) Stable. No evidence of ischemia. Continue current regimen.  Problem # 2:  CAROTID ARTERY STENOSIS (ICD-433.10) Stable for long time. No neuro signs. F/u in 6 months with u/s.  Problem # 3:  AORTIC STENOSIS/ INSUFFICIENCY, NON-RHEUMATIC (ICD-424.1) Mild. Stable and asymptomatic. Due for repeat u/s in 1 year.   Problem # 4:  ABDOMINAL AORTIC ANEURYSM (ICD-441.4) Stable by recent CT. F/yu u/u in 6 months.  Appended Document: Orders  Update    Clinical Lists Changes  Orders: Added new Test order of Carotid Duplex (Carotid Duplex) - Signed Added new Test order of Abdominal Aorta Duplex (Abd Aorta Duplex) - Signed

## 2010-08-11 NOTE — Progress Notes (Signed)
Summary: call a nurse  Phone Note From Other Clinic   Caller: call a nurse Summary of Call: [facility] Triage Call Report Triage Record Num: [id number] Operator: Karenann Cai Patient Name: [name] Call Date & Time: 12/14/2009 7:31:24AM Patient Phone: [phone] PCP: Kerby Nora Patient Gender: Female PCP Fax : Patient DOB: 06/30/1928 Practice Name: [facility] Reason for Call: Daughter/[name] is calling to report that patient has injured her right leg on 12/13/2009. Patient is in pain and is unable to bare weigh t on her right knee. RN advised Daughter to have patient evaluated at [facility] and follow up with PCP in the am. Protocol(s) Used: Knee Injury Recommended Outcome per Protocol: See ED Immediately Reason for Outcome: Knee is "locked" since injury Care Advice:  ~ Another adult should drive. 12/14/2009 7:38:43AM Page 1 of 1 [id number] Initial call taken by: Lowella Petties CMA,  December 15, 2009 10:07 AM

## 2010-08-11 NOTE — Letter (Signed)
Summary: [facility]   Imported By: Lanelle Bal 07/16/2009 09:37:58  _____________________________________________________________________  External Attachment:    Type:   Image     Comment:   External Document

## 2010-08-11 NOTE — Assessment & Plan Note (Signed)
Summary: ROV   Visit Type:  Follow-up Referring Kowen Kluth:  n/a Primary Aharon Carriere:  Becky Nora MD  CC:  c/o SOB.Marland Kitchen  History of Present Illness: [name] is a delightful 77 year old woman with history of coronary artery disease status post non-ST-elevation myocardial iinfarction in January 2007, treated with bare-metal stenting to the obtuse marginal.  She has a totally occluded right coronary with distal collateralization ad signifcant LCX disease which is being treated medically as it is not easily amenable to intervention. She has normal LV function.  Remainder of PMHx notable for: aortic stenosis, peripheral vascular disease with previous abdominal aortic aneurysm repair and bilateral iliac stenting, carotid artery stenosis, renal artery stenosis with an atrophic left kidney and a moderate blockage on the right, hypertension and hyperlipidemia.   She returns today for routine f/u. Feels ok but over past week or two has had some more dyspnea. No CP. Stomach feels full and feels like it hard to get food down. Recently started prednisone due to sore L foot. No orthopnea or PND. No frank edema. Very depressed at this time of year as she remembers her husband's death.   Studies:  1) Carotids 10/11: R 60-79% L 0-39% (stable) 2) Echo 10/11: EF 55-65% Mild AS mean gradient 18 mil-mod AI 3) AAA stable 4.4. cm on CT in 02/2009   Preventive Screening-Counseling & Management  Alcohol-Tobacco     Smoking Status: never  Current Medications (verified): 1)  Plavix 75 Mg Tabs (Clopidogrel Bisulfate) .... Take 1 Tablet By Mouth Once A Day 2)  Pantoprazole Sodium 40 Mg Tbec (Pantoprazole Sodium) .Marland Kitchen.. 1 Tab By Mouth Daily 3)  Lipitor 80 Mg  Tabs (Atorvastatin Calcium) .... Take 1 Tablet By Mouth Once A Day 4)  Aspirin Ec 81 Mg Tbec (Aspirin) .Marland Kitchen.. 1 Tablet By Mouth Once A Day 5)  Synthroid 100 Mcg Tabs (Levothyroxine Sodium) .... Take 1 Tablet By Mouth Once A Day 6)  Metoprolol Tartrate 25 Mg Tabs  (Metoprolol Tartrate) .... Take 1/2 Tablet By Mouth Twice A Day 7)  Caltrate 600 1500 Mg Tabs (Calcium Carbonate) .... Take 2 Tablet By Mouth Two Times A Day 8)  Lisinopril 10 Mg  Tabs (Lisinopril) .... Take 1 Tablet By Mouth Once A Day 9)  Isosorbide Mononitrate Cr 30 Mg Xr24h-Tab (Isosorbide Mononitrate) .... Take Two Tablets By Mouth Daily 10)  Voltaren 1 % Gel (Diclofenac Sodium) .... As Needed 11)  Prednisone 5 Mg Tabs (Prednisone) .... As Needed For Gout 12)  Uloric 40 Mg Tabs (Febuxostat) .... Take 1 Tablet By Mouth Once A Day  Allergies (verified): 1)  ! Codeine 2)  ! Sulfa 3)  ! Morphine Sulfate Cr (Morphine Sulfate) 4)  ! Naproxen (Naproxen) 5)  ! * Fish Oil  Past History:  Past Medical History: Last updated: 06/11/2009  1. CAD     a. s/p NSTEMI 1/07 with BMS to OM     b. cath 12/09 due to positive Myoview           -LM ok. LAD 40%.  LCX 80-90% mid. RCA chronically occluded with R->L collats  2. Moderate aortic stenosis     a. Echo 12/09. Moderate AS mean 19. AVA 0.88  3. AAA, recurrent     a. s/p repair. now 3.8 cm     b. h/o ischemic bowel 2006  4. PAD     a. s/p iliac stents  5. Carotid artery stenosis       a. R 60-79% L 0-39%  (  6/10) - stable  6. Renal artery stenosis     a. atrophic L kidney. moderate blockage on R  7. HTN  8. Hyperlipidemia     a. increased LFTs on Vytorin. Changed to Lipitor  9. Mild CRI (Cr 1.3)    10. Hyperparathyroidism 11. Basal cell CA on nose 1980s 12. Hypothyroidism 13. GERD 14. Depression with death of spouse 2006 15. Moderate-sized ventral hernia  Past Surgical History: Last updated: 02/17/2007 AAA repair 1995 - 07/01/2005, angioplasty  2005 - 07/01/2005, Appendectomy - 07/01/2005, b iliac stents 2005 - 07/01/2005, cholecystectomy - 07/01/2005, colonoscopy: colonic erosions, ulcers proctitis - 07/07/2005, endoscopy nml - 07/07/2005, NSTEMI 1/07 s/p stent in circumflex - 08/13/2005, [place] l kidney diffuse atrophy - 07/01/2005 1987     Gallbladder 1996    Breast biopsy 1954    Appendectomy 1943    Tonsillectomy 1997    Abd. hernia repair 2005     Stent placement, both legs - PVD in arteries 04/2005 Hosp. ? abd. prob. (mesenteric ischemia) 07/2005   MI  - stent placed 01/2006   Hosp for reflux (chest pain)  Family History: Last updated: Jan 18, 2009 Father: Died 15 MI, throat CA Mother: Died 82, MI Siblings: 1 brother, 2 sisters No MI <55 No FH of Colon Cancer: Breast Cancer: Sister   Social History: Last updated: 2010-06-14 husband deceased 2023/04/19 from multiple myeloma, lives with daughter in [place], no tob (former smoker) no ETOH, no IVDU Alcohol use-no Drug use-no Tobacco Use - No.   Risk Factors: Smoking Status: never (14-Jun-2010)  Social History: husband deceased Apr 19, 2023 from multiple myeloma, lives with daughter in [place], no tob (former smoker) no ETOH, no IVDU Alcohol use-no Drug use-no Tobacco Use - No.  Smoking Status:  never  Review of Systems       As per HPI and past medical history; otherwise all systems negative.   Vital Signs:  Patient profile:   77 year old female Height:      66 inches Weight:      180 pounds BMI:     29.16 Pulse rate:   65 / minute BP sitting:   142 / 82  (left arm) Cuff size:   regular  Vitals Entered By: Lysbeth Galas CMA 06/14/10 2:34 PM)  Physical Exam  General:  Tearful. no resp difficulty HEENT: normal Neck: supple. JVP 6-7 Carotids 2+ bilat + radiated bruits. No lymphadenopathy or thryomegaly appreciated. Cor: PMI nondisplaced. Regular rate & rhythm. No rubs, gallops,3/6 AS murmur S2  mildly to moderately diminished. i do not hear AI Lungs: clear Abdomen: soft, nontender, nondistended. small ventral hernia Extremities: no cyanosis, clubbing, rash, edema. gouty nodule L 1st toe Neuro: alert & orientedx3, cranial nerves grossly intact. moves all 4 extremities w/o difficulty. affect plea   Impression & Recommendations:  Problem # 1:  DYSPNEA  (ICD-786.05) I suspect she may have a little volume overload in the setting of prednisone use as her weight is up a bit too. Will use lasix 60md/kcl 20 per day for 2 days and then as needed to see if this helps.   Problem # 2:  CAD (ICD-414.00) Stable. No evidence of ischemia. Continue current regimen.  Problem # 3:  AORTIC STENOSIS/ INSUFFICIENCY, NON-RHEUMATIC (ICD-424.1)  Stable. Continue to follow with regular echos.   Her updated medication list for this problem includes:    Metoprolol Tartrate 25 Mg Tabs (Metoprolol tartrate) .Marland Kitchen... Take 1/2 tablet by mouth twice a day    Lisinopril 10  Mg Tabs (Lisinopril) .Marland Kitchen... Take 1 tablet by mouth once a day    Isosorbide Mononitrate Cr 30 Mg Xr24h-tab (Isosorbide mononitrate) .Marland Kitchen... Take two tablets by mouth daily    Furosemide 20 Mg Tabs (Furosemide) .Marland Kitchen... Take one tablet by mouth x 2 days. then as needed for increased swelling.  Patient Instructions: 1)  Your physician recommends that you schedule a follow-up appointment in: 1 month 2)  Your physician has recommended you make the following change in your medication: Start Furosemide 20mg  for 2 days then as needed for increased swelling. Start Potassium Chloride 20 meq for 2 days then as needed along with Furosemide.  Prescriptions: POTASSIUM CHLORIDE CRYS CR 20 MEQ CR-TABS (POTASSIUM CHLORIDE CRYS CR) Take one tablet by mouth x 2 days. Then as needed with Furosemide.  #20 x 0   Entered by:   Cloyde Reams RN   Authorized by:   Dolores Patty, MD, [facility]   Signed by:   Cloyde Reams RN on 06/12/2010   Method used:   Electronically to        [facility] [phone]* (retail)       [address] [place], [place]  [address]       Ph: [phone] or [phone]       Fax: [phone]   RxID:   [id number] FUROSEMIDE 20 MG TABS (FUROSEMIDE) Take one tablet by mouth x 2 days. Then as needed for increased swelling.  #20 x 0   Entered by:   Cloyde Reams RN    Authorized by:   Dolores Patty, MD, [facility]   Signed by:   Cloyde Reams RN on 06/12/2010   Method used:   Electronically to        [facility] [phone]* (retail)       [address] [place], [place]  [address]       Ph: [phone] or [phone]       Fax: [phone]   RxID:   [id number]

## 2010-08-11 NOTE — Assessment & Plan Note (Signed)
Summary: [facility]   Visit Type:  Follow-up Referring Provider:  n/a Primary Provider:  Kerby Nora MD  CC:  no complaints.  History of Present Illness:  [name] is a delightful 77 year old woman with history of coronary artery disease status post non-ST-elevation myocardial iinfarction in January 2007, treated with bare-metal stenting to the obtuse marginal.  She has a totally occluded right coronary with distal collateralization ad signifcant LCX disease which is being treated medically as it is not easily amenable to intervention. She has normal LV function.  Remainder of PMHx notable for: aortic stenosis, peripheral vascular disease with previous abdominal aortic aneurysm repair and bilateral iliac stenting, carotid artery stenosis, renal artery stenosis with an atrophic left kidney and a moderate blockage on the right, hypertension and hyperlipidemia.   She returns today for routine f/u. Recently had CT which showed stable lung nodule. AAA mildly increased from 4.2 to 4.4. cm  Is doing great. No chest pain or dyspnea. Finished cardiac rehab but didn't sign up for maintenance program. Continues to walk on her own.  No orthopnea, PND or edema. No focal neuro symptoms. Main complaint is continued gout pain in L great toe. Follwoing with Dr. Dareen Piano.   Current Medications (verified): 1)  Plavix 75 Mg Tabs (Clopidogrel Bisulfate) .... Take 1 Tablet By Mouth Once A Day 2)  Pantoprazole Sodium 40 Mg Tbec (Pantoprazole Sodium) .Marland Kitchen.. 1 Tab By Mouth Daily 3)  Lipitor 80 Mg  Tabs (Atorvastatin Calcium) .... Take 1 Tablet By Mouth Once A Day 4)  Aspirin Ec 81 Mg Tbec (Aspirin) .Marland Kitchen.. 1 Tablet By Mouth Once A Day 5)  Synthroid 100 Mcg Tabs (Levothyroxine Sodium) .... Take 1 Tablet By Mouth Once A Day 6)  Metoprolol Tartrate 25 Mg Tabs (Metoprolol Tartrate) .... Take 1/2 Tablet By Mouth Twice A Day 7)  Caltrate 600 1500 Mg Tabs (Calcium Carbonate) .... Take 2 Tablet By Mouth Two Times A  Day 8)  Lisinopril 10 Mg  Tabs (Lisinopril) .... Take 1 Tablet By Mouth Once A Day 9)  Isosorbide Mononitrate Cr 30 Mg Xr24h-Tab (Isosorbide Mononitrate) .... Take Two Tablets By Mouth Daily 10)  Voltaren 1 % Gel (Diclofenac Sodium) .... As Needed 11)  Prednisone 5 Mg Tabs (Prednisone) .... As Needed For Gout 12)  Uloric 40 Mg Tabs (Febuxostat) .... Take 1 Tablet By Mouth Once A Day  Allergies (verified): 1)  ! Codeine 2)  ! Sulfa 3)  ! Morphine Sulfate Cr (Morphine Sulfate) 4)  ! Naproxen (Naproxen) 5)  ! * Fish Oil  Review of Systems       As per HPI and past medical history; otherwise all systems negative.   Vital Signs:  Patient profile:   77 year old female Height:      66 inches Weight:      177 pounds BMI:     28.67 Pulse rate:   49 / minute BP sitting:   128 / 68  (left arm) Cuff size:   regular  Vitals Entered By: Hardin Negus, RMA (March 04, 2010 12:04 PM)  Physical Exam  General:  Well appearing. no resp difficulty HEENT: normal Neck: supple. no JVD. Carotids 2+ bilat + radiated bruits. No lymphadenopathy or thryomegaly appreciated. Cor: PMI nondisplaced. Regular rate & rhythm. No rubs, gallops,3/6 AS murmur S2  mildly to moderately diminished Lungs: clear Abdomen: soft, nontender, nondistended. small ventral hernia Extremities: no cyanosis, clubbing, rash, edema. gouty nodule L 1st toe Neuro: alert & orientedx3, cranial nerves grossly intact.  moves all 4 extremities w/o difficulty. affect plea   Impression & Recommendations:  Problem # 1:  CAD (ICD-414.00) Stable. No evidence of ischemia. Continue current regimen.  Problem # 2:  AORTIC STENOSIS/ INSUFFICIENCY, NON-RHEUMATIC (ICD-424.1) Asymptomatic. Due for repeat u/s in 2 months.  Problem # 3:  CAROTID ARTERY STENOSIS (ICD-433.10) Need to follow closely as RICA fairly tight. no symptoms. Due for repeat u/s in 2 months.  Problem # 4:  PERIPHERAL VASCULAR DISEASE (ICD-443.9) AAA mildly enlarged but  not significantly so. F/u with u/s in 6 months.   Other Orders: EKG w/ Interpretation (93000) Echocardiogram (Echo) Carotid Duplex (Carotid Duplex)  Patient Instructions: 1)  Your physician has requested that you have a carotid duplex. This test is an ultrasound of the carotid arteries in your neck. It looks at blood flow through these arteries that supply the brain with blood. Allow one hour for this exam. There are no restrictions or special instructions.  Needs in 2 months in [place] office. 2)  Your physician has requested that you have an echocardiogram.  Echocardiography is a painless test that uses sound waves to create images of your heart. It provides your doctor with information about the size and shape of your heart and how well your heart's chambers and valves are working.  This procedure takes approximately one hour. There are no restrictions for this procedure.  Needs in 2 months in [place] office. 3)  Your physician wants you to follow-up in:  6 months.  You will receive a reminder letter in the mail two months in advance. If you don't receive a letter, please call our office to schedule the follow-up appointment.

## 2010-08-11 NOTE — Letter (Signed)
Summary: Colonoscopy-Changed to Office Visit Letter  [facility]  [address] [place], [place] [address]   Phone: [phone]  Fax: [phone]      April 13, 2010 MRN: [id number]   [facility] [address] [place], [place]  [address]   Dear Ms. Strauss,   According to our records, it is time for you to schedule a Colonoscopy. However, after reviewing your medical record, I feel that an office visit would be most appropriate to more completely evaluate you and determine your need for a repeat procedure.  Please call [phone] (option #2) at your convenience to schedule an office visit. If you have any questions, concerns, or feel that this letter is in error, we would appreciate your call.   Sincerely,  Barbette Hair. Arlyce Dice, M.D.  [facility] [phone]

## 2010-08-11 NOTE — Letter (Signed)
Summary: [facility]   Imported By: Lanelle Bal 12/10/2009 09:37:00  _____________________________________________________________________  External Attachment:    Type:   Image     Comment:   External Document

## 2010-08-11 NOTE — Assessment & Plan Note (Signed)
Summary: F/U/DLO   Vital Signs:  Patient profile:   77 year old female Height:      66 inches Weight:      171.8 pounds BMI:     27.83 O2 Sat:      97 % on Room air Temp:     97.7 degrees F oral Pulse rate:   72 / minute Pulse rhythm:   regular Resp:     18 per minute BP sitting:   90 / 58  (left arm) Cuff size:   regular  Vitals Entered By: [name] CMA (AAMA) (December 23, 2009 2:25 PM)  O2 Flow:  Room air  History of Present Illness: Chief complaint follow up  Seen 4 days ago Dx with likely pneumonia... treated with doxycycline 100 mg two times a day.  Sincce then feels like improved SOB, less cough. No fever. Feels 10 % better.   No N/V.Marland Kitchenexcept took one tab of doxy on empty stomach....threw up once.   No lightheadedness, no dizzyness.    Problems Prior to Update: 1)  Pneumonia  (ICD-486) 2)  Senile Osteoporosis  (ICD-733.01) 3)  Ischemic Colitis  (ICD-557.9) 4)  Abdominal Pain, Epigastric  (ICD-789.06) 5)  Uti  (ICD-599.0) 6)  Carotid Artery Stenosis  (ICD-433.10) 7)  Aortic Stenosis/ Insufficiency, Non-rheumatic  (ICD-424.1) 8)  Abdominal Aortic Aneurysm  (ICD-441.4) 9)  Low Back Pain, Mild  (ICD-724.2) 10)  Cad  (ICD-414.00) 11)  Special Screening For Osteoporosis  (ICD-V82.81) 12)  Epigastric Pain  (ICD-789.06) 13)  Ventral Hernia  (ICD-553.20) 14)  Pulmonary Nodule, Right Middle Lobe  (ICD-518.89) 15)  Rlq Pain  (ICD-789.03) 16)  Back Pain, Acute  (ICD-724.5) 17)  Symptom, Incontinence, Feces  (ICD-787.6) 18)  Cystocele With Incomplete Uterine Prolapse  (ICD-618.2) 19)  Renal Insufficiency  (ICD-588.9) 20)  Peripheral Vascular Disease  (ICD-443.9) 21)  Hypothyroidism  (ICD-244.9) 22)  Hypertension  (ICD-401.9) 23)  Hyperlipidemia  (ICD-272.4) 24)  Gerd  (ICD-530.81)  Current Medications (verified): 1)  Plavix 75 Mg Tabs (Clopidogrel Bisulfate) .... Take 1 Tablet By Mouth Once A Day 2)  Pantoprazole Sodium 40 Mg Tbec (Pantoprazole Sodium) .Marland Kitchen.. 1 Tab  By Mouth Daily 3)  Lipitor 80 Mg  Tabs (Atorvastatin Calcium) .... Take 1 Tablet By Mouth Once A Day 4)  Aspirin Ec 81 Mg Tbec (Aspirin) .Marland Kitchen.. 1 Tablet By Mouth Once A Day 5)  Synthroid 100 Mcg Tabs (Levothyroxine Sodium) .... Take 1 Tablet By Mouth Once A Day 6)  Metoprolol Tartrate 25 Mg Tabs (Metoprolol Tartrate) .... Take 1/2 Tablet By Mouth Twice A Day 7)  Caltrate 600 1500 Mg Tabs (Calcium Carbonate) .... Take 1 Tablet By Mouth Once A Day 8)  Lisinopril 10 Mg  Tabs (Lisinopril) .... Take 1 Tablet By Mouth Once A Day 9)  Isosorbide Mononitrate Cr 30 Mg Xr24h-Tab (Isosorbide Mononitrate) .... Take Two Tablets By Mouth Daily 10)  Voltaren 1 % Gel (Diclofenac Sodium) .... As Needed 11)  Prednisone 5 Mg Tabs (Prednisone) .... One Tablet Daily For 1 Month 12)  Uloric 40 Mg Tabs (Febuxostat) .... Once Tablet Daily For 2 Weeks 13)  Doxycycline Hyclate 100 Mg Tabs (Doxycycline Hyclate) .Marland Kitchen.. 1 Tab By Mouth Two Times A Day X 10 Days  Allergies: 1)  ! Codeine 2)  ! Sulfa 3)  ! Morphine Sulfate Cr (Morphine Sulfate) 4)  ! Naproxen (Naproxen) 5)  ! * Fish Oil  Past History:  Past medical, surgical, family and social histories (including risk factors) reviewed, and no  changes noted (except as noted below).  Past Medical History: Reviewed history from 06/11/2009 and no changes required.  1. CAD     a. s/p NSTEMI 1/07 with BMS to OM     b. cath 12/09 due to positive Myoview           -LM ok. LAD 40%.  LCX 80-90% mid. RCA chronically occluded with R->L collats  2. Moderate aortic stenosis     a. Echo 12/09. Moderate AS mean 19. AVA 0.88  3. AAA, recurrent     a. s/p repair. now 3.8 cm     b. h/o ischemic bowel 2006  4. PAD     a. s/p iliac stents  5. Carotid artery stenosis       a. R 60-79% L 0-39%  (6/10) - stable  6. Renal artery stenosis     a. atrophic L kidney. moderate blockage on R  7. HTN  8. Hyperlipidemia     a. increased LFTs on Vytorin. Changed to Lipitor  9. Mild CRI (Cr  1.3)    10. Hyperparathyroidism 11. Basal cell CA on nose 1980s 12. Hypothyroidism 13. GERD 14. Depression with death of spouse 2006 15. Moderate-sized ventral hernia  Past Surgical History: Reviewed history from 02/17/2007 and no changes required. AAA repair 1995 - 07/01/2005, angioplasty  2005 - 07/01/2005, Appendectomy - 07/01/2005, b iliac stents 2005 - 07/01/2005, cholecystectomy - 07/01/2005, colonoscopy: colonic erosions, ulcers proctitis - 07/07/2005, endoscopy nml - 07/07/2005, NSTEMI 1/07 s/p stent in circumflex - 08/13/2005, [place] l kidney diffuse atrophy - 07/01/2005 1987    Gallbladder 1996    Breast biopsy 1954    Appendectomy 1943    Tonsillectomy 1997    Abd. hernia repair 2005     Stent placement, both legs - PVD in arteries 04/2005 Hosp. ? abd. prob. (mesenteric ischemia) 07/2005   MI  - stent placed 01/2006   Hosp for reflux (chest pain)  Family History: Reviewed history from 01/16/2009 and no changes required. Father: Died 61 MI, throat CA Mother: Died 90, MI Siblings: 1 brother, 2 sisters No MI <55 No FH of Colon Cancer: Breast Cancer: Sister   Social History: Reviewed history from 02/17/2007 and no changes required. husband deceased May 12, 2023 from multiple myeloma, lives with daughter in [place], no tob (former smoker) no ETOH, no IVDU Alcohol use-no Drug use-no  Physical Exam  General:  Well-developed,well-nourished,in no acute distress; alert,appropriate and cooperative throughout examination Eyes:  No corneal or conjunctival inflammation noted. EOMI. Perrla. Funduscopic exam benign, without hemorrhages, exudates or papilledema. Vision grossly normal. Ears:  clear fluid B TMs Nose:  External nasal examination shows no deformity or inflammation. Nasal mucosa are pink and moist without lesions or exudates. Mouth:  Oral mucosa and oropharynx without lesions or exudates.  Teeth in good repair. Neck:  no carotid bruit or thyromegaly no cervical or supraclavicular  lymphadenopathy  Lungs:  Normal respiratory effort, chest expands symmetrically. Lungs are clear to auscultation, no crackles or wheezes.  MUCh improved from last OV.  Heart:  Normal rate and regular rhythm. S1 and S2 normal without gallop, murmur, click, rub or other extra sounds. Pulses:  R and L posterior tibial pulses are full and equal bilaterally  Extremities:  no edema   Impression & Recommendations:  Problem # 1:  PNEUMONIA (ICD-486) CAP. Improving on doxycycline. Push fluids, rest. COmplete full antibiotic course. Call if not resolved or not continuing to improve.  Her updated medication list for this problem includes:  Doxycycline Hyclate 100 Mg Tabs (Doxycycline hyclate) .Marland Kitchen... 1 tab by mouth two times a day x 10 days  Complete Medication List: 1)  Plavix 75 Mg Tabs (Clopidogrel bisulfate) .... Take 1 tablet by mouth once a day 2)  Pantoprazole Sodium 40 Mg Tbec (Pantoprazole sodium) .Marland Kitchen.. 1 tab by mouth daily 3)  Lipitor 80 Mg Tabs (Atorvastatin calcium) .... Take 1 tablet by mouth once a day 4)  Aspirin Ec 81 Mg Tbec (Aspirin) .Marland Kitchen.. 1 tablet by mouth once a day 5)  Synthroid 100 Mcg Tabs (Levothyroxine sodium) .... Take 1 tablet by mouth once a day 6)  Metoprolol Tartrate 25 Mg Tabs (Metoprolol tartrate) .... Take 1/2 tablet by mouth twice a day 7)  Caltrate 600 1500 Mg Tabs (Calcium carbonate) .... Take 1 tablet by mouth once a day 8)  Lisinopril 10 Mg Tabs (Lisinopril) .... Take 1 tablet by mouth once a day 9)  Isosorbide Mononitrate Cr 30 Mg Xr24h-tab (Isosorbide mononitrate) .... Take two tablets by mouth daily 10)  Voltaren 1 % Gel (Diclofenac sodium) .... As needed 11)  Prednisone 5 Mg Tabs (Prednisone) .... One tablet daily for 1 month 12)  Uloric 40 Mg Tabs (Febuxostat) .... Once tablet daily for 2 weeks 13)  Doxycycline Hyclate 100 Mg Tabs (Doxycycline hyclate) .Marland Kitchen.. 1 tab by mouth two times a day x 10 days  Patient Instructions: 1)  Continue current doxycycline and  complete 10 day course. 2)   Call if not continuing to improve or symptoms resolved at end of antibiotics. 3)   Go to ER if Shortness of breath.  4)  Follow up in 6 months for CPX, with labs prior CMET, lipids, CBC TSH Dx 272.0 , 244.9  Current Allergies (reviewed today): ! CODEINE ! SULFA ! MORPHINE SULFATE CR (MORPHINE SULFATE) ! NAPROXEN (NAPROXEN) ! * FISH OIL

## 2010-08-13 ENCOUNTER — Encounter: Payer: Self-pay | Admitting: Family Medicine

## 2010-08-13 ENCOUNTER — Encounter (INDEPENDENT_AMBULATORY_CARE_PROVIDER_SITE_OTHER): Payer: Self-pay | Admitting: *Deleted

## 2010-08-13 NOTE — Letter (Signed)
Summary: [facility]  [facility]   Imported By: Lanelle Bal 06/29/2010 13:13:01  _____________________________________________________________________  External Attachment:    Type:   Image     Comment:   External Document  Appended Document: [facility] Please fax them a note that it is ok to proceed with surgery using a local block without any further cardiac work-up. thanks  Appended Document: [facility] Letter faxed to [facility] 07/01/10 /MES

## 2010-08-13 NOTE — Letter (Signed)
Summary: Results Follow up Letter  [place] at [facility]  [address] [place], [place] [address]   Phone: [phone]  Fax: [phone]    07/09/2010 MRN: [id number]     Laportia Bonadonna [address] [place], [place]  [address]    Dear Ms. Ator,  The following are the results of your recent test(s):  Test         Result    Pap Smear:        Normal __x___  Not Normal _____ Comments:Repeat in 1 year ______________________________________________________ Cholesterol: LDL(Bad cholesterol):         Your goal is less than:         HDL (Good cholesterol):       Your goal is more than: Comments:  ______________________________________________________ Mammogram:        Normal _____  Not Normal _____ Comments:  ___________________________________________________________________ Hemoccult:        Normal _____  Not normal _______ Comments:    _____________________________________________________________________ Other Tests:    We routinely do not discuss normal results over the telephone.  If you desire a copy of the results, or you have any questions about this information we can discuss them at your next office visit.   Sincerely,  Kerby Nora MD

## 2010-08-13 NOTE — Assessment & Plan Note (Signed)
Summary: flu shot/alc  Nurse Visit   Allergies: 1)  ! Codeine 2)  ! Sulfa 3)  ! Morphine Sulfate Cr (Morphine Sulfate) 4)  ! Naproxen (Naproxen) 5)  ! * Fish Oil  Orders Added: 1)  Flu Vaccine 19yrs + MEDICARE PATIENTS [Q2039] 2)  Administration Flu vaccine - MCR [G0008]  Flu Vaccine Consent Questions     Do you have a history of severe allergic reactions to this vaccine? no    Any prior history of allergic reactions to egg and/or gelatin? no    Do you have a sensitivity to the preservative Thimersol? no    Do you have a past history of Guillan-Barre Syndrome? no    Do you currently have an acute febrile illness? no    Have you ever had a severe reaction to latex? no    Vaccine information given and explained to patient? yes    Are you currently pregnant? no    Lot Number:[id number]   Exp Date:01/09/2011   Site Given  Left Deltoid IM

## 2010-08-13 NOTE — Progress Notes (Signed)
Summary: SOB/Full stomach  Phone Note Call from Patient Call back at Home Phone [phone]   Caller: Patient Call For: nurse Summary of Call: Pt c/o fullness in top of stomach and chest. Doesn't feel like she can breathe and feels like she needs to burp. pt does have a hernia and pt seems to think it from this. Please advise. Initial call taken by: Lysbeth Galas CMA,  July 07, 2010 8:36 AM  Follow-up for Phone Call        Spoke to pt, she has had s+s of "fullness in stomach, feeling of need to burp, and incr SOB" for about 2 weeks and this has progressed. Reviewed meds with pt, no changes. She takes pantoprazole once daily for GERD. Pt had same symptoms 1 month prior at ov with Dr. Gala Romney and pt was on prednisone for gout, and had wt gain of 6 lbs, she was tx with lasix for 2 days and she states this helped s+s but not completely. Pt has been off Prednisone 1 month. Pt denies any wt gain at this time, she has lost 2lbs. Pt has a ventral hernia, and is s/p AAA repair in 90's. Please advise. Follow-up by: Lanny Hurst RN,  July 07, 2010 10:16 AM  Additional Follow-up for Phone Call Additional follow up Details #1::        Called pt to f/u with symptoms, she states it has backed off some, but is still bothering her. Pt states she will stay with her son tonight, notified pt if SOB incr and/or chest pain develops to go to ER. Also advised pt to take Maalox/Pepcid or something otc for reflux to see if resolves symptoms in the meantime. Otherwise pt will call office in am if symptoms not resolved and we will have her come in for nurse visit/EKG. Additional Follow-up by: Lanny Hurst RN,  July 07, 2010 4:52 PM    Additional Follow-up for Phone Call Additional follow up Details #2::    Pt called this am, she reports symptoms have subsided after taking 2 doses of Maalox last night. She still has slight SOB but states this is her norm. Pt states she "ate bad over holidays" and feels this  is related to her previous s+s. She has f/u appt with Dr. Ermalene Searing this Caleen Essex 07/10/10 and f/u with [name] 07/31/10. Pt will call office if any changes occur before appts. Follow-up by: Lanny Hurst RN,  July 08, 2010 8:31 AM

## 2010-08-13 NOTE — Letter (Signed)
Summary: [facility]   Imported By: Maryln Gottron 08/05/2010 12:49:07  _____________________________________________________________________  External Attachment:    Type:   Image     Comment:   External Document

## 2010-08-13 NOTE — Letter (Signed)
Summary: Clearance Letter  [facility]ologist at [facility] [address]   [place], [place] [address]   Phone: [phone]  Fax: [phone]    July 01, 2010  Re:     [name] Address:   [address]     [place], [place]  [address] DOB:     01-08-1928 MRN:     [id number]   Dear Dr. Leonides Grills  [facility]    It is ok to proceed with surgery using a local block without any further cardiac work-up.        Sincerely,  Arvilla Meres, MD

## 2010-08-13 NOTE — Letter (Signed)
Summary: [facility] Office Visit Note   [facility] Office Visit Note   Imported By: Roderic Ovens 07/07/2010 16:14:56  _____________________________________________________________________  External Attachment:    Type:   Image     Comment:   External Document

## 2010-08-13 NOTE — Assessment & Plan Note (Signed)
Summary: CPX/DLO   Vital Signs:  Patient profile:   77 year old female Height:      66 inches Weight:      177.50 pounds BMI:     28.75 Temp:     98.2 degrees F oral Pulse rate:   64 / minute Pulse rhythm:   regular BP sitting:   100 / 70  (left arm) Cuff size:   regular  Vitals Entered By: [name] CMA Duncan Dull) (July 10, 2010 11:15 AM) CC: Hypertension Management, Lipid Management  Vision Screening:      Vision Comments: patient wears glasses   Vision Entered By: [name] CMA Duncan Dull) (July 10, 2010 11:31 AM) 40db HL: Left  Right  Audiometry Comment: Patient can hear whispher from across the room    History of Present Illness: Chief complaint Annual wellness visit  I have personally reviewed the Medicare Annual Wellness questionnaire and have noted 1.   The patient's medical and social history 2.   Their use of alcohol, tobacco or illicit drugs 3.   Their current medications and supplements 4.   The patient's functional ability including ADL's, fall risks, home safety risks and hearing or visual             impairment. 5.   Diet and physical activities 6.   Evidence for depression or mood disorders The patients weight, height, BMI and visual acuity have been recorded in the chart I have made referrals, counseling and provided education to the patient based review of the above and I have provided the pt with a written personalized care plan for preventive services.  Has been having some problems with breathing in last few months. Feels pressure from abdomen where ventral hernia is... intermittant....after meals.anytime during the day. Feels like she cannot take a deep breath.  Feels like she needs to burp constantly.  Points to fullness, mild pain in epigastrum aafter eating. Took some maalox... has helped significantly. Has been drinking a lot of soda. On pantoprazole for reflux.  GI is Arlyce Dice but has not seen in years.   When saw Dr. Derl Barrow  l2/2... given lasix/ KCL x 2 days for volume overload from using prednisone. Swelling improved. eeling of pressure in stomach improved only temporarily.  Denies any PND or orthopnea.  Gout.. using prednisone as needed flares... has planned surgery for tophi removal... Dr. Lonell Face. ONly take two tab in early December.   ARF on CRF.. changes.Marland Kitchen only took lasix 2 days in early 06/2010..not on regularly. 1/17 ..has appt with Dr. Tyrone Schimke.  Hypothyroid.Marland Kitchen not well controlled..will increase thyrpid med.   Hypertension History:      She complains of dyspnea with exertion, but denies headache, chest pain, palpitations, and syncope.        Positive major cardiovascular risk factors include female age 28 years old or older, hyperlipidemia, and hypertension.  Negative major cardiovascular risk factors include non-tobacco-user status.        Positive history for target organ damage include ASHD (either angina/prior MI/prior CABG), peripheral vascular disease, and renal insufficiency.    Lipid Management History:      Positive NCEP/ATP III risk factors include female age 30 years old or older, hypertension, ASHD (either angina/prior MI/prior CABG), peripheral vascular disease, and aortic aneurysm.  Negative NCEP/ATP III risk factors include non-tobacco-user status.        Comments: trig high.. cannot tolerate fish oil. Not interested in fenofibrate.    Preventive Screening-Counseling & Management  Caffeine-Diet-Exercise  Diet Comments: moderate     Diet Counseling: to improve diet; diet is suboptimal     Type of exercise: walking      Times/week: <3     Exercise Counseling: to improve exercise regimen  Problems Prior to Update: 1)  Other Screening Mammogram  (ICD-V76.12) 2)  Dyspnea  (ICD-786.05) 3)  Dyspnea  (ICD-786.05) 4)  Pneumonia  (ICD-486) 5)  Senile Osteoporosis  (ICD-733.01) 6)  Ischemic Colitis  (ICD-557.9) 7)  Abdominal Pain, Epigastric  (ICD-789.06) 8)  Uti   (ICD-599.0) 9)  Carotid Artery Stenosis  (ICD-433.10) 10)  Aortic Stenosis/ Insufficiency, Non-rheumatic  (ICD-424.1) 11)  Abdominal Aortic Aneurysm  (ICD-441.4) 12)  Low Back Pain, Mild  (ICD-724.2) 13)  Cad  (ICD-414.00) 14)  Special Screening For Osteoporosis  (ICD-V82.81) 15)  Epigastric Pain  (ICD-789.06) 16)  Ventral Hernia  (ICD-553.20) 17)  Pulmonary Nodule, Right Middle Lobe  (ICD-518.89) 18)  Rlq Pain  (ICD-789.03) 19)  Back Pain, Acute  (ICD-724.5) 20)  Symptom, Incontinence, Feces  (ICD-787.6) 21)  Cystocele With Incomplete Uterine Prolapse  (ICD-618.2) 22)  Renal Insufficiency  (ICD-588.9) 23)  Peripheral Vascular Disease  (ICD-443.9) 24)  Hypothyroidism  (ICD-244.9) 25)  Hypertension  (ICD-401.9) 26)  Hyperlipidemia  (ICD-272.4) 27)  Gerd  (ICD-530.81)  Current Medications (verified): 1)  Plavix 75 Mg Tabs (Clopidogrel Bisulfate) .... Take 1 Tablet By Mouth Once A Day 2)  Nexium 40 Mg Cpdr (Esomeprazole Magnesium) .... Take 1 Tablet By Mouth Once A Day 3)  Lipitor 80 Mg  Tabs (Atorvastatin Calcium) .... Take 1 Tablet By Mouth Once A Day 4)  Aspirin Ec 81 Mg Tbec (Aspirin) .Marland Kitchen.. 1 Tablet By Mouth Once A Day 5)  Synthroid 112 Mcg Tabs (Levothyroxine Sodium) .Marland Kitchen.. 1 Tab By Mouth Daily 6)  Metoprolol Tartrate 25 Mg Tabs (Metoprolol Tartrate) .... Take 1/2 Tablet By Mouth Twice A Day 7)  Caltrate 600 1500 Mg Tabs (Calcium Carbonate) .... Take 2 Tablet By Mouth Two Times A Day 8)  Lisinopril 10 Mg  Tabs (Lisinopril) .... Take 1 Tablet By Mouth Once A Day 9)  Isosorbide Mononitrate Cr 30 Mg Xr24h-Tab (Isosorbide Mononitrate) .... Take Two Tablets By Mouth Daily 10)  Prednisone 5 Mg Tabs (Prednisone) .... As Needed For Gout 11)  Uloric 40 Mg Tabs (Febuxostat) .... Take 1 Tablet By Mouth Once A Day 12)  Maalox Regular Strength 200-200-20 Mg/52ml Susp (Alum & Mag Hydroxide-Simeth) .... As Needed For Indigestion  Allergies: 1)  ! Codeine 2)  ! Sulfa 3)  ! Morphine Sulfate Cr  (Morphine Sulfate) 4)  ! Naproxen (Naproxen) 5)  ! * Fish Oil  Past History:  Past medical, surgical, family and social histories (including risk factors) reviewed, and no changes noted (except as noted below).  Past Medical History: Reviewed history from 06/11/2009 and no changes required.  1. CAD     a. s/p NSTEMI 1/07 with BMS to OM     b. cath 12/09 due to positive Myoview           -LM ok. LAD 40%.  LCX 80-90% mid. RCA chronically occluded with R->L collats  2. Moderate aortic stenosis     a. Echo 12/09. Moderate AS mean 19. AVA 0.88  3. AAA, recurrent     a. s/p repair. now 3.8 cm     b. h/o ischemic bowel 2006  4. PAD     a. s/p iliac stents  5. Carotid artery stenosis       a. R  60-79% L 0-39%  (6/10) - stable  6. Renal artery stenosis     a. atrophic L kidney. moderate blockage on R  7. HTN  8. Hyperlipidemia     a. increased LFTs on Vytorin. Changed to Lipitor  9. Mild CRI (Cr 1.3)    10. Hyperparathyroidism 11. Basal cell CA on nose 1980s 12. Hypothyroidism 13. GERD 14. Depression with death of spouse 2006 15. Moderate-sized ventral hernia  Past Surgical History: Reviewed history from 02/17/2007 and no changes required. AAA repair 1995 - 07/01/2005, angioplasty  2005 - 07/01/2005, Appendectomy - 07/01/2005, b iliac stents 2005 - 07/01/2005, cholecystectomy - 07/01/2005, colonoscopy: colonic erosions, ulcers proctitis - 07/07/2005, endoscopy nml - 07/07/2005, NSTEMI 1/07 s/p stent in circumflex - 08/13/2005, [place] l kidney diffuse atrophy - 07/01/2005 1987    Gallbladder 1996    Breast biopsy 1954    Appendectomy 1943    Tonsillectomy 1997    Abd. hernia repair 2005     Stent placement, both legs - PVD in arteries 04/2005 Hosp. ? abd. prob. (mesenteric ischemia) 07/2005   MI  - stent placed 01/2006   Hosp for reflux (chest pain)  Family History: Reviewed history from 01/16/2009 and no changes required. Father: Died 24 MI, throat CA Mother: Died 24, MI Siblings:  1 brother, 2 sisters No MI <55 No FH of Colon Cancer: Breast Cancer: Sister   Social History: Reviewed history from 06/12/2010 and no changes required. husband deceased 2023/05/09 from multiple myeloma, lives with daughter in [place], no tob (former smoker) no ETOH, no IVDU Alcohol use-no Drug use-no Tobacco Use - No.   Review of Systems General:  Complains of fatigue; denies fever; tires easliy and poor sleep.. CV:  Denies chest pain or discomfort. Resp:  Denies cough, sputum productive, and wheezing. GI:  Denies bloody stools, constipation, and diarrhea. Psych:  Denies anxiety and depression.  Physical Exam  General:  Well-developed,well-nourished,in no acute distress; alert,appropriate and cooperative throughout examination Eyes:  No corneal or conjunctival inflammation noted. EOMI. Perrla. Funduscopic exam benign, without hemorrhages, exudates or papilledema. Vision grossly normal. Ears:  External ear exam shows no significant lesions or deformities.  Otoscopic examination reveals clear canals, tympanic membranes are intact bilaterally without bulging, retraction, inflammation or discharge. Hearing is grossly normal bilaterally. Nose:  External nasal examination shows no deformity or inflammation. Nasal mucosa are pink and moist without lesions or exudates. Mouth:  Oral mucosa and oropharynx without lesions or exudates.  Teeth in good repair. Neck:  no carotid bruit or thyromegaly no cervical or supraclavicular lymphadenopathy  Lungs:  Normal respiratory effort, chest expands symmetrically. Lungs are clear to auscultation, no crackles or wheezes. Heart:  Normal rate and regular rhythm. S1 and S2 normal without gallop,3/6 AS murmur, no  click, rub or other extra sounds. Abdomen:  Bowel sounds positive,abdomen soft and non-tender without masses, organomegaly , large vental hernia.Marland Kitchenno incarceration Msk:  No deformity or scoliosis noted of thoracic or lumbar spine.   Pulses:  R and L posterior  tibial pulses are full and equal bilaterally  Extremities:  no edema Skin:  Intact without suspicious lesions or rashes Psych:  Cognition and judgment appear intact. Alert and cooperative with normal attention span and concentration. No apparent delusions, illusions, hallucinations   Impression & Recommendations:  Problem # 1:  Preventive Health Care (ICD-V70.0) The patient's preventative maintenance and recommended screening tests for an annual wellness exam were reviewed in full today. Brought up to date unless services declined.  Counselled on the  importance of diet, exercise, and its role in overall health and mortality. The patient's FH and SH was reviewed, including their home life, tobacco status, and drug and alcohol status.     Problem # 2:  HYPERTENSION (ICD-401.9)  Well controlled. Continue current medication.  The following medications were removed from the medication list:    Furosemide 20 Mg Tabs (Furosemide) .Marland Kitchen... Take one tablet by mouth x 2 days. then as needed for increased swelling. Her updated medication list for this problem includes:    Metoprolol Tartrate 25 Mg Tabs (Metoprolol tartrate) .Marland Kitchen... Take 1/2 tablet by mouth twice a day    Lisinopril 10 Mg Tabs (Lisinopril) .Marland Kitchen... Take 1 tablet by mouth once a day  BP today: 100/70 Prior BP: 142/82 (06/12/2010)  Prior 10 Yr Risk Heart Disease: N/A (06/21/2008)  Labs Reviewed: K+: 4.8 (07/03/2010) Creat: : 2.0 (07/03/2010)   Chol: 276 (07/03/2010)   HDL: 47.00 (07/03/2010)   LDL: DEL (06/17/2008)   TG: 262.0 (07/03/2010)  Problem # 3:  GERD (ICD-530.81) ? causing burping, pressure in epigastrum and difficulty taking deep breaths. Maalox helped symtpoms. Change pantoprazole back to nexium... samples given. MAy also be due to ventral hernia.   No clear cardiac or pulmonary source on exam.  If not improving will need further eval of these areas.  Inadequate control of thyroid may also be causing some feeling of  difficulty filling lungs. See #6  Her updated medication list for this problem includes:    Nexium 40 Mg Cpdr (Esomeprazole magnesium) .Marland Kitchen... Take 1 tablet by mouth once a day    Maalox Regular Strength 200-200-20 Mg/30ml Susp (Alum & mag hydroxide-simeth) .Marland Kitchen... As needed for indigestion  Problem # 4:  HYPERLIPIDEMIA (ICD-272.4) Almost at goal <100. Contonue current meds. Encouraged exercise, weight loss, healthy eating habits.  Her updated medication list for this problem includes:    Lipitor 80 Mg Tabs (Atorvastatin calcium) .Marland Kitchen... Take 1 tablet by mouth once a day  Labs Reviewed: SGOT: 19 (07/03/2010)   SGPT: 16 (07/03/2010)  Lipid Goals: Chol Goal: 200 (06/21/2008)   HDL Goal: 40 (06/21/2008)   LDL Goal: 100 (06/21/2008)   TG Goal: 150 (06/21/2008)  Prior 10 Yr Risk Heart Disease: N/A (06/21/2008)   HDL:47.00 (07/03/2010), 48.70 (10/07/2009)  LDL:DEL (06/17/2008), DEL (08/25/2007)  Chol:276 (07/03/2010), 175 (10/07/2009)  Trig:262.0 (07/03/2010), 211.0 (10/07/2009)  Problem # 5:  RENAL FAILURE, ACUTE (ICD-584.9) No celar cause.. reevaluate creatinine today.  Orders: TLB-Renal Function Panel (80069-RENAL)  Problem # 6:  HYPOTHYROIDISM (ICD-244.9) Undertreated.. may be causing sensation of dyspnea/fatigue. Increase dose and recheck in 4-6 weeks.  Her updated medication list for this problem includes:    Synthroid 112 Mcg Tabs (Levothyroxine sodium) .Marland Kitchen... 1 tab by mouth daily TSH: 15.42 (07/03/2010 8:33:42 AM)   Complete Medication List: 1)  Plavix 75 Mg Tabs (Clopidogrel bisulfate) .... Take 1 tablet by mouth once a day 2)  Nexium 40 Mg Cpdr (Esomeprazole magnesium) .... Take 1 tablet by mouth once a day 3)  Lipitor 80 Mg Tabs (Atorvastatin calcium) .... Take 1 tablet by mouth once a day 4)  Aspirin Ec 81 Mg Tbec (Aspirin) .Marland Kitchen.. 1 tablet by mouth once a day 5)  Synthroid 112 Mcg Tabs (Levothyroxine sodium) .Marland Kitchen.. 1 tab by mouth daily 6)  Metoprolol Tartrate 25 Mg Tabs (Metoprolol  tartrate) .... Take 1/2 tablet by mouth twice a day 7)  Caltrate 600 1500 Mg Tabs (Calcium carbonate) .... Take 2 tablet by mouth two times a day 8)  Lisinopril 10 Mg Tabs (Lisinopril) .... Take 1 tablet by mouth once a day 9)  Isosorbide Mononitrate Cr 30 Mg Xr24h-tab (Isosorbide mononitrate) .... Take two tablets by mouth daily 10)  Prednisone 5 Mg Tabs (Prednisone) .... As needed for gout 11)  Uloric 40 Mg Tabs (Febuxostat) .... Take 1 tablet by mouth once a day 12)  Maalox Regular Strength 200-200-20 Mg/15ml Susp (Alum & mag hydroxide-simeth) .... As needed for indigestion  Other Orders: Medicare -1st Annual Wellness Visit [phone])  Hypertension Assessment/Plan:      The patient's hypertensive risk group is category C: Target organ damage and/or diabetes.  Today's blood pressure is 100/70.  Her blood pressure goal is < 140/90.  Lipid Assessment/Plan:      Based on NCEP/ATP III, the patient's risk factor category is "history of coronary disease, peripheral vascular disease, cerebrovascular disease, or aortic aneurysm".  The patient's lipid goals are as follows: Total cholesterol goal is 200; LDL cholesterol goal is 100; HDL cholesterol goal is 40; Triglyceride goal is 150.  Her LDL cholesterol goal has been met.    Patient Instructions: 1)  I have provided you with a copy of your personalized plan for preventive services. Please take the time to review along with your updated medication list.  2)  Melatonin 3 mg at bedtime for sleep. 3)   Limti napping during the day. 4)  Increase synthroid to 112 micrograms daily. 5)   Return for TSH Dx 244.9 in 4 weeks. 6)  Stop pantoprazole.. try nexium instead.  7)  Call if abdominal pain/fullness not improving.  8)  Please schedule a follow-up appointment in 6 months  routine follow up or earlier if needed.  Prescriptions: SYNTHROID 112 MCG TABS (LEVOTHYROXINE SODIUM) 1 tab by mouth daily  #30 x 0   Entered and Authorized by:   Kerby Nora MD    Signed by:   Kerby Nora MD on 07/10/2010   Method used:   Electronically to        [facility] [phone]* (retail)       [address] [place], [place]  [address]       Ph: [phone] or [phone]       Fax: [phone]   RxID:   [id number]    Orders Added: 1)  TLB-Renal Function Panel [80069-RENAL] 2)  Medicare -1st Annual Wellness Visit [G0438] 3)  Est. Patient Level III [03474]    Current Allergies (reviewed today): ! CODEINE ! SULFA ! MORPHINE SULFATE CR (MORPHINE SULFATE) ! NAPROXEN (NAPROXEN) ! * FISH OIL  Last Flu Vaccine:  Fluvax 3+ (07/03/2010 8:09:16 AM) Flu Vaccine Next Due:  1 yr   Appended Document: CPX/DLO In addition prednisone use for gout may be worsening GERD/gastritis.  Limit use as able.

## 2010-08-13 NOTE — Progress Notes (Signed)
Summary: order for mammogram  Phone Note Call from Patient Call back at Home Phone [phone]   Caller: Patient Call For: Kerby Nora MD Summary of Call: Patient says that she is due for a mammogram. She is asking for an order for this. She wants to go to the [facility]. Patient would like late morning appt.  Initial call taken by: Melody Comas,  June 23, 2010 9:27 AM  New Problems: OTHER SCREENING MAMMOGRAM (ICD-V76.12)   New Problems: OTHER SCREENING MAMMOGRAM (ICD-V76.12)

## 2010-08-13 NOTE — Letter (Signed)
Summary: MEDICARE ANNUAL WELLNESS VISIT QUESTIONNAIRE  MEDICARE ANNUAL WELLNESS VISIT QUESTIONNAIRE   Imported By: Carin Primrose 07/10/2010 15:49:19  _____________________________________________________________________  External Attachment:    Type:   Image     Comment:   External Document

## 2010-08-19 NOTE — Miscellaneous (Signed)
  Clinical Lists Changes  Observations: Added new observation of PLATELETK/UL: 191 K/uL (07/28/2010 9:25) Added new observation of RDW: 15.4 % (07/28/2010 9:25) Added new observation of MCV: 87 fL (07/28/2010 9:25) Added new observation of HCT: 38.0 % (07/28/2010 9:25) Added new observation of HGB: 12.8 g/dL (04/54/0981 1:91) Added new observation of RBC M/UL: 4.35 M/uL (07/28/2010 9:25) Added new observation of WBC COUNT: 8.1 10*3/microliter (07/28/2010 9:25)

## 2010-08-19 NOTE — Miscellaneous (Signed)
  Clinical Lists Changes  Observations: Added new observation of SGPT (ALT): 13 units/L (07/28/2010 9:31) Added new observation of SGOT (AST): 15 units/L (07/28/2010 9:31) Added new observation of CREATININE: 1.27 mg/dL (16/04/9603 5:40) Added new observation of BUN: 29 mg/dL (98/05/9146 8:29) Added new observation of FERRITIN: 64 ng/mL (07/28/2010 9:31) Added new observation of IRON SATUR %: 23 % (07/28/2010 9:31) Added new observation of IRON: 76 mcg/dL (56/21/3086 5:78) Added new observation of WBC COUNT: 76 10*3/microliter (07/28/2010 9:31)

## 2010-08-19 NOTE — Assessment & Plan Note (Signed)
Summary: F1M/AMD   Visit Type:  Follow-up Referring Provider:  n/a Primary Provider:  Kerby Nora MD  CC:  "Doing well" denies chest pain, SOB, and and palpitations..  History of Present Illness: [name] is a delightful 77 year old woman with history of coronary artery disease status post non-ST-elevation myocardial iinfarction in January 2007, treated with bare-metal stenting to the obtuse marginal.  She has a totally occluded right coronary with distal collateralization ad signifcant LCX disease which is being treated medically as it is not easily amenable to intervention. She has normal LV function.  Remainder of PMHx notable for: aortic stenosis, peripheral vascular disease with previous abdominal aortic aneurysm repair and bilateral iliac stenting, carotid artery stenosis, renal artery stenosis with an atrophic left kidney and a moderate blockage on the right, hypertension and hyperlipidemia.   She returns today for routine f/u. At her last visit we added lasik and kcl. Cr went up to 2.0 and potassium 5.6 so it was stopped. Now she feels great. No CP. Continues with chronic DOE. Not exercising regularly any more. No orthopnea, PND or edema.   1) Carotids 10/11: R 60-79% L 0-39% (stable) 2) Echo 10/11: EF 55-65% Mild AS mean gradient 18 mil-mod AI 3) AAA stable 4.4. cm on CT in 02/2009   Current Medications (verified): 1)  Plavix 75 Mg Tabs (Clopidogrel Bisulfate) .... Take 1 Tablet By Mouth Once A Day 2)  Nexium 40 Mg Cpdr (Esomeprazole Magnesium) .... Take 1 Tablet By Mouth Once A Day 3)  Lipitor 80 Mg  Tabs (Atorvastatin Calcium) .... Take 1 Tablet By Mouth Once A Day 4)  Aspirin Ec 81 Mg Tbec (Aspirin) .Marland Kitchen.. 1 Tablet By Mouth Once A Day 5)  Synthroid 112 Mcg Tabs (Levothyroxine Sodium) .Marland Kitchen.. 1 Tab By Mouth Daily 6)  Metoprolol Tartrate 25 Mg Tabs (Metoprolol Tartrate) .... Take 1/2 Tablet By Mouth Twice A Day 7)  Caltrate 600 1500 Mg Tabs (Calcium Carbonate) .... Take 2 Tablet  By Mouth Two Times A Day 8)  Lisinopril 10 Mg  Tabs (Lisinopril) .... Take 1 Tablet By Mouth Once A Day 9)  Isosorbide Mononitrate Cr 30 Mg Xr24h-Tab (Isosorbide Mononitrate) .... Take Two Tablets By Mouth Daily 10)  Prednisone 5 Mg Tabs (Prednisone) .... As Needed For Gout 11)  Uloric 40 Mg Tabs (Febuxostat) .... Take 1 Tablet By Mouth Once A Day 12)  Maalox Regular Strength 200-200-20 Mg/69ml Susp (Alum & Mag Hydroxide-Simeth) .... As Needed For Indigestion 13)  Kalexate  Powd (Sodium Polystyrene Sulfonate) .Marland Kitchen.. 15 Gm By Mouth Daily X 2 Days 14)  Colcrys 0.6 Mg Tabs (Colchicine) .Marland Kitchen.. 1 Tablet Daily For 30 Days  Allergies (verified): 1)  ! Codeine 2)  ! Sulfa 3)  ! Morphine Sulfate Cr (Morphine Sulfate) 4)  ! Naproxen (Naproxen) 5)  ! * Fish Oil  Past History:  Past Medical History: Last updated: 06/11/2009  1. CAD     a. s/p NSTEMI 1/07 with BMS to OM     b. cath 12/09 due to positive Myoview           -LM ok. LAD 40%.  LCX 80-90% mid. RCA chronically occluded with R->L collats  2. Moderate aortic stenosis     a. Echo 12/09. Moderate AS mean 19. AVA 0.88  3. AAA, recurrent     a. s/p repair. now 3.8 cm     b. h/o ischemic bowel 2006  4. PAD     a. s/p iliac stents  5. Carotid artery  stenosis       a. R 60-79% L 0-39%  (6/10) - stable  6. Renal artery stenosis     a. atrophic L kidney. moderate blockage on R  7. HTN  8. Hyperlipidemia     a. increased LFTs on Vytorin. Changed to Lipitor  9. Mild CRI (Cr 1.3)    10. Hyperparathyroidism 11. Basal cell CA on nose 1980s 12. Hypothyroidism 13. GERD 14. Depression with death of spouse 2006 15. Moderate-sized ventral hernia  Past Surgical History: Last updated: 02/17/2007 AAA repair 1995 - 07/01/2005, angioplasty  2005 - 07/01/2005, Appendectomy - 07/01/2005, b iliac stents 2005 - 07/01/2005, cholecystectomy - 07/01/2005, colonoscopy: colonic erosions, ulcers proctitis - 07/07/2005, endoscopy nml - 07/07/2005, NSTEMI 1/07 s/p  stent in circumflex - 08/13/2005, [place] l kidney diffuse atrophy - 07/01/2005 1987    Gallbladder 1996    Breast biopsy 1954    Appendectomy 1943    Tonsillectomy 1997    Abd. hernia repair 2005     Stent placement, both legs - PVD in arteries 04/2005 Hosp. ? abd. prob. (mesenteric ischemia) 07/2005   MI  - stent placed 01/2006   Hosp for reflux (chest pain)  Family History: Last updated: 2009/01/21 Father: Died 39 MI, throat CA Mother: Died 33, MI Siblings: 1 brother, 2 sisters No MI <55 No FH of Colon Cancer: Breast Cancer: Sister   Social History: Last updated: 06-17-10 husband deceased 04-22-2023 from multiple myeloma, lives with daughter in [place], no tob (former smoker) no ETOH, no IVDU Alcohol use-no Drug use-no Tobacco Use - No.   Risk Factors: Diet: moderate (07/10/2010)  Risk Factors: Smoking Status: never (06/17/10)  Vital Signs:  Patient profile:   77 year old female Height:      66 inches Weight:      176.25 pounds BMI:     28.55 Pulse rate:   60 / minute BP sitting:   108 / 68  (left arm) Cuff size:   regular  Vitals Entered By: Lysbeth Galas CMA (July 31, 2010 2:58 PM)  Physical Exam  General:  no resp difficulty HEENT: normal Neck: supple. JVP flat Carotids 2+ bilat + radiated bruits. No lymphadenopathy or thryomegaly appreciated. Cor: PMI nondisplaced. Regular rate & rhythm. No rubs, gallops,3/6 AS murmur S2  mildly to moderately diminished. i do not hear AI Lungs: clear Abdomen: soft, nontender, nondistended. small ventral hernia Extremities: no cyanosis, clubbing, rash, edema. gouty nodule L 1st toe Neuro: alert & orientedx3, cranial nerves grossly intact. moves all 4 extremities w/o difficulty. affect pleasant   Impression & Recommendations:  Problem # 1:  CAD (ICD-414.00) Stable. No evidence of ischemia. Continue current regimen.  Problem # 2:  AORTIC STENOSIS/ INSUFFICIENCY, NON-RHEUMATIC (ICD-424.1) Stable. Continue to follow with regular  echos.   Problem # 3:  CAROTID ARTERY STENOSIS (ICD-433.10) Stable for long time. No neuro signs. F/u in 4 months with u/s.

## 2010-08-20 ENCOUNTER — Encounter: Payer: Self-pay | Admitting: Family Medicine

## 2010-08-21 ENCOUNTER — Telehealth: Payer: Self-pay | Admitting: Internal Medicine

## 2010-09-02 NOTE — Progress Notes (Signed)
Summary: Stopping Plavix for surgery  Phone Note Call from Patient Call back at [phone]   Caller: Self Call For: Bradshaw Minihan Summary of Call: Pt is having surgery on her foot for gout.  Was told to d/c Plavix for 5 days prior to the surgery.  Pt wants to make sure that this is okay.  The surgery is scheduled for 2/24. Initial call taken by: Harlon Flor,  August 21, 2010 9:31 AM  Follow-up for Phone Call        notified patient of whom is doing the surgery; Dr. Leonides Grills with [facility] orthopedics please call his office @ [phone] and fax # is  [phone]. Follow-up by: Bishop Dublin, CMA,  August 21, 2010 2:59 PM  Additional Follow-up for Phone Call Additional follow up Details #1::        Pt is calling regarding this message. States that she has not heard anything yet. Additional Follow-up by: Harlon Flor,  August 24, 2010 8:41 AM    Additional Follow-up for Phone Call Additional follow up Details #2::    ok to stop, if needed. can they operate on toe while on Plavix? Dolores Patty, MD, [facility]  August 24, 2010 10:12 AM   Additional Follow-up for Phone Call Additional follow up Details #3:: Details for Additional Follow-up Action Taken: Called Dr. Ashok Norris office left msg with nurse, they will call back today. Lanny Hurst RN  August 24, 2010 11:38 AM  Dr. Ashok Norris RN states that they cannot do surgery on her foot without stopping Plavix 5 days. Notified that if this is needed Dr. Gala Romney states ok to hold. Pt notified of this as well. Additional Follow-up by: Lanny Hurst RN,  August 24, 2010 5:19 PM

## 2010-09-02 NOTE — Letter (Signed)
Summary: Request for Surgical Clearance/[facility]  Request for Surgical Clearance/ Orthopaedics   Imported By: Maryln Gottron 08/25/2010 15:07:52  _____________________________________________________________________  External Attachment:    Type:   Image     Comment:   External Document

## 2010-10-19 LAB — HEPATIC FUNCTION PANEL
ALT: 13 U/L (ref 0–35)
ALT: 20 U/L (ref 0–35)
ALT: 9 U/L (ref 0–35)
AST: 21 U/L (ref 0–37)
AST: 23 U/L (ref 0–37)
AST: 28 U/L (ref 0–37)
Albumin: 3.4 g/dL — ABNORMAL LOW (ref 3.5–5.2)
Alkaline Phosphatase: 47 U/L (ref 39–117)
Alkaline Phosphatase: 53 U/L (ref 39–117)
Bilirubin, Direct: <0.1 mg/dL (ref 0.0–0.3)
Indirect Bilirubin: 0.9 mg/dL (ref 0.3–0.9)
Total Protein: 6.5 g/dL (ref 6.0–8.3)
Total Protein: 6.7 g/dL (ref 6.0–8.3)

## 2010-10-19 LAB — CARDIAC PANEL(CRET KIN+CKTOT+MB+TROPI)
CK, MB: 3.1 ng/mL (ref 0.3–4.0)
Relative Index: 2.1 (ref 0.0–2.5)
Total CK: 151 U/L (ref 7–177)
Total CK: 160 U/L (ref 7–177)
Troponin I: 0.05 ng/mL (ref 0.00–0.06)

## 2010-10-19 LAB — PROTIME-INR
INR: 1.1 (ref 0.00–1.49)
Prothrombin Time: 14.8 seconds (ref 11.6–15.2)

## 2010-10-19 LAB — COMPREHENSIVE METABOLIC PANEL
AST: 22 U/L (ref 0–37)
Albumin: 3.5 g/dL (ref 3.5–5.2)
Chloride: 106 mEq/L (ref 96–112)
Creatinine, Ser: 1.63 mg/dL — ABNORMAL HIGH (ref 0.4–1.2)
GFR calc Af Amer: 37 mL/min — ABNORMAL LOW (ref >60)
Total Bilirubin: 0.9 mg/dL (ref 0.3–1.2)
Total Protein: 6.5 g/dL (ref 6.0–8.3)

## 2010-10-19 LAB — URINALYSIS, ROUTINE W REFLEX MICROSCOPIC
Bilirubin Urine: NEGATIVE
Bilirubin Urine: NEGATIVE
Glucose, UA: NEGATIVE mg/dL
Glucose, UA: NEGATIVE mg/dL
Hgb urine dipstick: NEGATIVE
Ketones, ur: NEGATIVE mg/dL
Ketones, ur: NEGATIVE mg/dL
Nitrite: NEGATIVE
Protein, ur: NEGATIVE mg/dL
Specific Gravity, Urine: 1.011 (ref 1.005–1.030)
Urobilinogen, UA: 0.2 mg/dL (ref 0.0–1.0)
pH: 5.5 (ref 5.0–8.0)
pH: 5.5 (ref 5.0–8.0)

## 2010-10-19 LAB — BASIC METABOLIC PANEL
BUN: 28 mg/dL — ABNORMAL HIGH (ref 6–23)
BUN: 31 mg/dL — ABNORMAL HIGH (ref 6–23)
CO2: 20 mEq/L (ref 19–32)
CO2: 24 mEq/L (ref 19–32)
Chloride: 108 mEq/L (ref 96–112)
Glucose, Bld: 102 mg/dL — ABNORMAL HIGH (ref 70–99)
Potassium: 3.7 mEq/L (ref 3.5–5.1)

## 2010-10-19 LAB — CK TOTAL AND CKMB (NOT AT ARMC): Relative Index: 2 (ref 0.0–2.5)

## 2010-10-19 LAB — CBC
HCT: 36.2 % (ref 36.0–46.0)
Hemoglobin: 12.4 g/dL (ref 12.0–15.0)
Hemoglobin: 12.7 g/dL (ref 12.0–15.0)
MCHC: 34.2 g/dL (ref 30.0–36.0)
MCV: 88.3 fL (ref 78.0–100.0)
MCV: 88.7 fL (ref 78.0–100.0)
MCV: 90 fL (ref 78.0–100.0)
Platelets: 150 10*3/uL (ref 150–400)
Platelets: 155 10*3/uL (ref 150–400)
RBC: 4.02 MIL/uL (ref 3.87–5.11)
RBC: 4.22 MIL/uL (ref 3.87–5.11)
RDW: 14.3 % (ref 11.5–15.5)
WBC: 4.9 10*3/uL (ref 4.0–10.5)
WBC: 5.3 10*3/uL (ref 4.0–10.5)
WBC: 6.4 10*3/uL (ref 4.0–10.5)

## 2010-10-19 LAB — DIFFERENTIAL
Basophils Absolute: 0 K/uL (ref 0.0–0.1)
Basophils Relative: 1 % (ref 0–1)
Eosinophils Absolute: 0.1 10*3/uL (ref 0.0–0.7)
Eosinophils Relative: 1 % (ref 0–5)
Lymphocytes Relative: 19 % (ref 12–46)
Lymphs Abs: 1.2 10*3/uL (ref 0.7–4.0)
Monocytes Absolute: 0.5 K/uL (ref 0.1–1.0)
Monocytes Relative: 7 % (ref 3–12)
Neutro Abs: 4.6 K/uL (ref 1.7–7.7)
Neutrophils Relative %: 72 % (ref 43–77)

## 2010-10-19 LAB — URINE MICROSCOPIC-ADD ON

## 2010-10-19 LAB — BASIC METABOLIC PANEL WITH GFR
Calcium: 9.2 mg/dL (ref 8.4–10.5)
Creatinine, Ser: 1.67 mg/dL — ABNORMAL HIGH (ref 0.4–1.2)
GFR calc Af Amer: 36 mL/min — ABNORMAL LOW (ref >60)
GFR calc non Af Amer: 30 mL/min — ABNORMAL LOW (ref >60)
Glucose, Bld: 134 mg/dL — ABNORMAL HIGH (ref 70–99)
Sodium: 139 meq/L (ref 135–145)

## 2010-10-19 LAB — AMYLASE: Amylase: 125 U/L (ref 27–131)

## 2010-10-19 LAB — APTT: aPTT: 28 s (ref 24–37)

## 2010-10-20 ENCOUNTER — Other Ambulatory Visit: Payer: Self-pay | Admitting: Family Medicine

## 2010-10-20 DIAGNOSIS — E039 Hypothyroidism, unspecified: Secondary | ICD-10-CM

## 2010-10-21 ENCOUNTER — Other Ambulatory Visit (INDEPENDENT_AMBULATORY_CARE_PROVIDER_SITE_OTHER): Payer: 59 | Admitting: Family Medicine

## 2010-10-21 DIAGNOSIS — E039 Hypothyroidism, unspecified: Secondary | ICD-10-CM

## 2010-11-05 ENCOUNTER — Ambulatory Visit (HOSPITAL_COMMUNITY): Payer: Medicare Other | Attending: Nephrology

## 2010-11-05 DIAGNOSIS — D509 Iron deficiency anemia, unspecified: Secondary | ICD-10-CM | POA: Insufficient documentation

## 2010-11-23 ENCOUNTER — Other Ambulatory Visit: Payer: Self-pay | Admitting: Cardiology

## 2010-11-23 DIAGNOSIS — I6529 Occlusion and stenosis of unspecified carotid artery: Secondary | ICD-10-CM

## 2010-11-24 ENCOUNTER — Encounter: Payer: Self-pay | Admitting: Family Medicine

## 2010-11-24 ENCOUNTER — Encounter (INDEPENDENT_AMBULATORY_CARE_PROVIDER_SITE_OTHER): Payer: Medicare Other | Admitting: *Deleted

## 2010-11-24 DIAGNOSIS — I6529 Occlusion and stenosis of unspecified carotid artery: Secondary | ICD-10-CM

## 2010-11-24 NOTE — Assessment & Plan Note (Signed)
[facility]                            [place] OFFICE NOTE   [name], [name]                     MRN:          [id number]  DATE:05/31/2008                            DOB:          14-Jun-1928    PRIMARY CARE PHYSICIAN:  Kerby Nora, MD   HISTORY OF PRESENT ILLNESS:  This is a 77 year old with a history of  coronary artery disease status post PCI, mild-to-moderate aortic  stenosis, and peripheral arterial disease as well as hypertension and  renal artery stenosis, who presents for evaluation of shortness of  breath and chest tightness.  The patient has not been seen in our office  for about a year.  She states that over the last couple of months she  began to notice a sense of fullness after eating.  She has also noted  that it is hard for her to take a deep breath.  She saw Dr. Patsy Lager at  [facility] and she was diagnosed with a probable ventral hernia and it  was thought that this might be contributing to these symptoms.  She has  been referred to Dr. Donell Beers with surgery and may possibly have a ventral  hernia repair.  On talking to me, along with the sense of fullness, she  also describes a history of chronic mild dyspnea on exertion.  She can  walk about 100 to 200 yards before becoming short of breath.  This has  been a pattern for several years for her.  Lately, the new sensation has  been that she cannot catch a deep breath and she attributes this to the  sense of fullness in her upper abdomen.  She does not have any  orthopnea.  She sleeps flat with 1 pillow.  She has no PND.  She does  report chest tightness in her lower chest.  She states that this is  separate from the sense of fullness that she gets with eating.  It is  quite atypical.  She cannot describe really what brings it on.  She  states it can last anywhere from a few minutes to half an hour and it is  not related to exertion.  This happens a couple of times a week, she  says, and has been going on for the last few months as well.  She states  that it feels somewhat different than the pain she had with her non-ST  elevation MI back in 2000.  She has been on an ACE inhibitor and has  been tolerating that without any trouble.  Her blood pressure is quite  good today.  She did have a CT of the abdomen and pelvis without  contrast back in July to evaluate her abdominal aortic aneurysm size.  We do not have a report on that back yet.   PAST MEDICAL HISTORY:  1. Coronary artery disease status post non-ST elevation MI in 2007.      Left heart catheterization at that time showed a 95% OM-1 stenosis      and totally occluded RCA proximally.  There were left-to-right  collaterals.  The patient did receive a bare-metal stent to the      first obtuse marginal.  2. Hypothyroidism.  3. Mild-to-moderate aortic stenosis.  Most recent echocardiogram was      in April 2008, showed an EF of 55%-60%, moderate left ventricular      hypertrophy, and there was restrictive diastolic function.  There      was mild aortic insufficiency.  There was mild-to-moderate aortic      stenosis with a mean gradient of 16 mmHg.  There was a mild mitral      regurgitation.  4. History of abdominal aortic aneurysm status post repair in 1995.      There is, per report, a residual thoracic aortic aneurysm.  5. History of carotid stenosis.  This has been asymptomatic.  In      November 2008, there was a 60%-79% right internal carotid stenosis      and a 0%-39% left internal carotid artery stenosis.  This was      stable compared to prior studies.  6. Renal artery stenosis.  The patient has an atrophic left kidney due      to left renal artery occlusion.  7. Hypertension with probable a renovascular component.  The patient      is tolerating her ACE inhibitor.  8. Chronic kidney disease, most recent creatinine was 1.2 in February      2009.  9. Hypercholesterolemia.  10.Prior smoker.   The patient quit smoking in 2006.  11.Peripheral arterial disease.  The patient does have bilateral      external iliac artery stents that were placed in 2005.   MEDICATIONS:  1. Aspirin 81 mg daily.  2. Lopressor 12.5 mg b.i.d.  3. Nexium 40 mg daily.  4. Plavix 75 mg daily.  5. Synthroid 100 mcg daily.  6. Lipitor 80 mg daily.  7. Lisinopril 10 mg daily.   EKG today shows sinus bradycardia.  It is a normal EKG.   Most recent labs in February 2009, HDL 46, LDL 71.8, triglycerides 278,  and creatinine 1.2.   SOCIAL HISTORY:  The patient is a widow.  She lives near her son.  She  did quit smoking back in 2006.   PHYSICAL EXAMINATION:  VITAL SIGNS:  Blood pressure is 112/78, heart  rate is 57 and regular.  GENERAL:  This is an elderly female in no apparent distress.  NEUROLOGIC:  Alert and oriented x3.  Normal affect.  NECK:  There is no thyromegaly or thyroid nodule.  There is no JVD.  CARDIOVASCULAR:  Heart regular.  S1 and S2.  There is a 3/6 systolic  crescendo-decrescendo murmur at the right upper sternal border.  S2 is  heard clearly.  Carotid upstrokes are difficult to assess.  There is no  peripheral edema.  There are 1+ posterior tibial pulses bilaterally.  ABDOMEN:  Soft.  There is mild epigastric tenderness.  There is a  suggestion of a ventral abdominal hernia.  There are no bowel sounds.  LUNGS:  Clear to auscultation bilaterally.   ASSESSMENT AND PLAN:  This is a 77 year old with a history of coronary  artery disease, aortic stenosis, peripheral arterial disease, and  hypertension with renal artery stenosis as well as chronic kidney  disease, who presents Cardiology Clinic for evaluation of shortness of  breath and atypical chest pain.  1. Coronary artery disease.  The patient did have a bare-metal stent      placed in her first obtuse  marginal back in 2007.  She does, at the      current time, have some atypical chest pain.  She has some      tightness in her  lower chest, it is difficult for her to describe,      but it has been going on for a couple of months.  It is separate      from the sense of abdominal fullness she has been experiencing.      She is concerned about this.  Given these symptoms and the fact      that the patient may be going possibly for a ventral hernia repair      in the future, I think it would be reasonable to obtain a Lexiscan      Myoview to assess for any evidence of ischemia.  I would worry      about in-stent restenosis in her circumflex system.  The patient      will continue on her aspirin, her beta-blocker, and her ACE      inhibitor.  2. Dyspnea on exertion.  The patient does appear to have fairly stable      dyspnea on exertion.  She also has been getting a sense of fullness      in her upper abdomen which has led her to feel like she cannot      catch a deep breath.  This may be due to the effects of her ventral      hernia.  I do think it would be reasonable to repeat her      echocardiogram.  She is due for one anyway for monitoring her      aortic stenosis.  Her aortic stenosis sounds moderate on exam to      me.  3. Carotid artery disease.  The patient is due for a followup carotid      ultrasound.  Given her moderate asymptomatic carotid disease, we      will go ahead and get that done.  4. Hypertension.  The patient's blood pressure is under excellent      control.  She does have a component of renovascular hypertension.      She is tolerating her ACE inhibitor.  Her last creatinine was 1.2.  5. Abdominal aortic aneurysm.  The patient is status post repair in      1995.  She did have a noncontrast CT of her abdomen and pelvis done      in July.  We will try to obtain the results of that study for      followup assessment of her abdominal aorta.  6. Hyperlipidemia.  The patient's LDL and HDL cholesterol were at goal      back in February 2009 with her last check on a current dose of       Lipitor.     Marca Ancona, MD  Electronically Signed    DM/MedQ  DD: 05/31/2008  DT: 06/01/2008  Job #: [id number]   cc:   Almond Lint, MD  Kerby Nora, MD  Juleen China, MD  Terrial Rhodes, M.D.

## 2010-11-24 NOTE — Discharge Summary (Signed)
NAME:  [name], FELBER              ACCOUNT NO.:  [id number]   MEDICAL RECORD NO.:  [id number]          PATIENT TYPE:  INP   LOCATION:  3713                         FACILITY:  [facility]   PHYSICIAN:  Bevelyn Buckles. [name], MDDATE OF BIRTH:  Feb 18, 1928   DATE OF ADMISSION:  01/03/2009  DATE OF DISCHARGE:  01/07/2009                               DISCHARGE SUMMARY   PROCEDURES:  1. Ultrasound of the abdomen complete.  2. CT of the abdomen and pelvis without contrast.  3. Esophagogastroduodenoscopy.   FINAL DISCHARGE DIAGNOSES:  Epigastric pain.   SECONDARY DIAGNOSES:  1. Status post non-ST segment elevation myocardial infarction in      January 2007 with a bare metal stent to the obtuse marginal, right      coronary artery totaled chronically  2. Status post cardiac catheterization in December 2009 with 80%-90%      circumflex lesion, medical therapy recommended.  3. History of aortic stenosis, moderate-to-severe with an ejection      fraction of 60% by echocardiogram, December 2009.  4. Peripheral vascular disease, status post bilateral iliac stents and      renal artery stenosis with an atrophic left kidney and moderate      stenosis to the right kidney.  5. Status post abdominal aortic aneurysm repair.  6. History of carotid artery stenosis.  7. Hypertension.  8. Diverticulosis and diverticulitis.  9. Dyslipidemia.  10.Depression.  11.Hypothyroidism.  12.Chronic kidney disease stage III with BUN of 28, creatinine 1.58,      and GFR of 32 this admission.  13.Remote history of tobacco use.  14.Family history of coronary artery disease (not premature).  15.Allergy or intolerance to CODEINE, MORPHINE, SULFA, and TORADOL.   TIME AT DISCHARGE:  42 minutes.   HOSPITAL COURSE:  [name] is a 77 year old female with known coronary  artery disease.  She had epigastric pain and came to the hospital where  she was admitted for further evaluation and treatment.   Her cardiac enzymes  were negative for MI.  She received some benefit  from pain control medications but her symptoms returned.  There was  concern for a GI source of her symptoms, so a GI consult was called.   She was seen by Dr. Leone Payor and an EGD was recommended.  This was  performed on January 06, 2009, and showed multiple polyps which were  biopsied.  Otherwise, the procedure was normal.  Surgical pathology is  pending at the time of dictation.  As part of her evaluation and amylase  and lipase were checked were negative.  An ultrasound test was performed  on her abdomen which showed status prior cholecystectomy, but the common  bile duct was normal and no stones were noted.  Left kidney was atrophic  and the right kidney was 10 cm with 2 adjacent upper pole renal cyst.  A  CT of the abdomen and pelvis without contrast had been performed which  showed stable aneurysmal dilatation of the distal thoracic aorta and  proximal abdominal aorta at 3.3 cm maximal dimension.  She also had  multiple ventral hernias containing fat.  Her pain has been predictably relieved by GI cocktails.  There is  concern for a functional cause of her symptoms but so far there is no  objective GI findings.  Since her ultrasound is negative, no further  inpatient workup is indicated and she is considered stable for discharge  with outpatient followup.   DISCHARGE INSTRUCTIONS:  Her activity level is to be increased  gradually.  She is to stick to a low-sodium heart-healthy diet.  She is  to follow up with Dr. Gala Romney on July 2, at 10 a.m., with Dr. Kerby Nora as needed.  She is to follow up with Dr. Leone Payor and call for an  appointment.   DISCHARGE MEDICATIONS:  1. Synthroid 100 mcg daily.  2. Metoprolol 25 mg 1/2 tablet b.i.d.  3. Lisinopril 10 mg a day.  4. Lipitor 80 mg a day.  5. Plavix 75 mg a day.  6. Protonix 40 mg a day.  7. Caltrate 600 plus D daily.  8. Aspirin 81 mg daily.  9. Imdur 30 mg daily  10.Omega-3  fish oil 1000 mg t.i.d.  11.Cipro 500 mg daily through January 10, 2009.  12.Maalox 15 mL and Donnatal 5 mL q.6 h. p.r.n.      Theodore Demark, PA-C      Bevelyn Buckles. [name], MD  Electronically Signed    RB/MEDQ  D:  01/07/2009  T:  01/08/2009  Job:  [id number]   cc:   Iva Boop, MD,FACG  Amy Ermalene Searing, MD

## 2010-11-24 NOTE — Cardiovascular Report (Signed)
NAME:  [name], [name]              ACCOUNT NO.:  [id number]   MEDICAL RECORD NO.:  [id number]          PATIENT TYPE:  OIB   LOCATION:  2899                         FACILITY:  [facility]   PHYSICIAN:  Bevelyn Buckles. [name], MDDATE OF BIRTH:  1927-12-07   DATE OF PROCEDURE:  06/11/2008  DATE OF DISCHARGE:  06/11/2008                            CARDIAC CATHETERIZATION   PRIMARY CARE PHYSICIAN:  Kerby Nora, MD   PATIENT IDENTIFICATION:  [name] is a very pleasant 77 year old woman  with a history of coronary artery disease status post percutaneous  intervention on an OM1 branch in 2007, mild-to-moderate aortic stenosis,  peripheral arterial disease, hypertension, and renal insufficiency with  one kidney.  She recently presented to Dr. Gaynelle Adu for evaluation.  She  was complaining of chronic dyspnea as well as some atypical chest pain.  There was some question whether she would have surgery on a ventral  hernia.  So, given her atypical chest pain and possible need for preop  evaluation, she underwent a lexiscan Myoview.  This showed a reversible  defect in the basilar inferolateral and anterolateral walls consistent  with ischemia.  This had come just about 15-20% of her myocardium.  She  was thus referred for catheterization.   PROCEDURE PERFORMED:  Selective coronary angiography.   DESCRIPTION OF THE PROCEDURE:  The risks and indications of  catheterization were explained.  Consent was signed and placed in the  chart.  Prior to catheterization, we hydrated her fairly aggressively.  We put a 5-French arterial sheath in the right femoral artery using a  modified Seldinger technique.  We used a JL-4 to cannulate the left  coronary system and attempted to cannulate the right system with a JR-4.  We did not attempt to cross the aortic valve.  There were no apparent  complications.  Central aortic pressure was 166/84 with a mean of 116.   Left main has minor luminal irregularities.   LAD was  a long vessel wrapping the apex, gave off 2 diagonals.  There  was a 40% proximal lesion and 40% mid lesion and minor luminal  irregularities throughout.   Left circumflex gave off a small ramus branch.  There was a moderate-  sized branching OM branch and distal posterolateral.  Throughout the  proximal AV groove circ, there was fairly tortuous with a hard bend.  There was a 50%-60% tubular stenosis.  In the mid AV groove circumflex,  there was an 80%-90% lesion at the mid AV groove circ at the takeoff of  the OM1.  In the OM1, the stent was widely patent.  There was a superior  branch that was jailed by the OM1 and had a 90%-95% ostial stenosis.   Right coronary artery was totally occluded ostially.  We were unable to  see it even on the flush shots.  There were left-to-right collaterals  filling the distal bed.  This was unchanged from 2007.   ASSESSMENT:  1. Three-vessel coronary artery disease as described above.  2. There is a high-grade lesion in the mid left circumflex, which is      quite tortuous.  It would be difficult to address percutaneously.   PLAN/DISCUSSION:  I have reviewed the films with Dr. Excell Seltzer.  The left  circumflex lesion certainly appears tight and likely corresponds with  her Myoview defect.  However, it is not clear to me that this was  causing her symptoms.  Given the technical challenge, I will try medical therapy for several  weeks by adding Imdur 30 mg a day.  If this fails and she has  progressive symptoms, we will have her follow up with Dr. Excell Seltzer for  possible percutaneous intervention.  She would need admission the night  before for overnight hydration.      Bevelyn Buckles. [name], MD  Electronically Signed     DRB/MEDQ  D:  06/11/2008  T:  06/12/2008  Job:  [id number]   cc:   Kerby Nora, MD

## 2010-11-24 NOTE — Consult Note (Signed)
NAME:  [name], [name]              ACCOUNT NO.:  [id number]   MEDICAL RECORD NO.:  [id number]          PATIENT TYPE:  INP   LOCATION:  3713                         FACILITY:  [facility]   PHYSICIAN:  Iva Boop, MD,FACGDATE OF BIRTH:  April 08, 1928   DATE OF CONSULTATION:  DATE OF DISCHARGE:                                 CONSULTATION   REQUESTING PHYSICIAN:  Hillis Range, MD   PRIMARY CARE PHYSICIAN:  Kerby Nora, MD   REASON FOR CONSULTATION:  Epigastric pain.   ASSESSMENT:  A 77 year old white woman admitted January 03, 2009, with  epigastric pain.  She thought that it was similar to prior angina with  indigestion, though this time she has had radiation into the back,  though it feels deep.  She has ruled out for myocardial infarction and  EKG is unrevealing.  The pain is intermittent and sharp and not  associated with food or movement or exertional triggers.  It responds to  a GI cocktail.  There is no dysphagia, odynophagia, or chronic heartburn  or reflux problem noted.   The response to GI cocktail is very suggestive of a GI cause, ulcer  disease, gastritis, or functional issue.  The back pain raises some  question of difficulty with the spine though it seems to start in  epigastrium and then there is some persistent back pain as well.  She  has had a CT of the abdomen and pelvis, which is reassuring.  Otherwise  not contrast given, but her previously repaired abdominal aortic  aneurysm is without change and there were no other identifiable  problems.  She does have left circumflex tight lesion found 06/2008,  90%, but percutaneous intervention is not really possible due to  anatomy.   RECOMMENDATIONS AND PLAN:  Upper GI endoscopy tomorrow.  My partner, Dr.  Juanda Chance will perform this as she is covering the hospital at that time.  Risks, benefits, indications, and alternatives explained.   HISTORY:  As above.  This lady has epigastric pain and heaviness that  started about  few days ago radiating to the back.  Triggers as described  above or lack thereof.  She has been ruled out for MI and we are asked  to see her for further evaluation as described above.   PAST MEDICAL HISTORY:  Coronary artery disease, NSTEMI January 2007 with  bare metal stent to the obtuse marginal.  December 2009, cardiac cath  due to positive Myoview showed 80-90% mid left circumflex, chronically  occluded right coronary artery with right-to-left collaterals, moderate  aortic stenosis, abdominal aortic aneurysm recurrent with repair and now  at 3.8 cm ischemic bowel/colitis 11/22/04 (Dr. Arlyce Dice), peripheral artery  disease with iliac stents, carotid artery stenosis, renal artery  stenosis, atrophic left kidney, hypertension, dyslipidemia, mild chronic  renal insufficiency, hyperparathyroidism, basal cell carcinoma,  hypothyroidism, GERD as listed, prior depression after her husband died  in 2004-11-22, and ventral hernia (these were seen on CT scan).   PAST SURGICAL HISTORY:  Include, cholecystectomy, ventral hernias, iliac  stents, and abdominal aortic aneurysm repair.   FAMILY HISTORY:  Coronary artery disease in  both parents, sister with  breast cancer, one sibling with pancreatic cancer.   DRUG ALLERGIES:  CODEINE, MORPHINE, SULFA, TORADOL.   MEDICATIONS:  Protonix 40 mg daily, Synthroid 100 mcg daily, metoprolol,  lisinopril, Lipitor, Plavix, Caltrate, aspirin, isosorbide, Cipro was  begun last Tuesday for UTI.   REVIEW OF SYSTEMS:  Recurrent UTIs.  No dyspnea.  There is chronic low  back pain, though she is having some back pain and interscapular pain  with this current problem.  He has some anxiety issues.  All other  systems are negative and are reviewed.   SOCIAL HISTORY:  She is widowed, lives in [place].  Her daughter is  here with her now, prior smoker.   PHYSICAL EXAMINATION:  GENERAL:  Well-developed elderly white woman in  no acute distress.  VITAL SIGNS:   Temperature 97.8, pulse 53, respirations 18, O2 sat 95%  room air.  Blood pressure 107/61.  HEENT:  The eyes are anicteric.  Mouth clear.  Pharynx clear.  NECK:  Supple.  No mass.  LUNGS:  Clear.  HEART:  S1 and S2.  There is a 2/6 systolic murmur heard best at the  left and right upper sternal border.  ABDOMEN:  Soft.  Bowel sounds present.  I detect no tenderness.  No  organomegaly or mass.  Ventral hernia easily reducible.  EXTREMITIES:  Without cyanosis, clubbing, or edema.  LYMPH NODES:  No cervical adenopathy.  NEURO:  She is alert and oriented x3.  Nonfocal.  PSYCHOLOGY:  Perhaps mildly anxious, otherwise appropriate.   LABORATORY DATA:  Radiology Data:  CT as described above.  White count  4.9, hemoglobin 12.7, platelets 144, BUN 28, creatinine 1.58.  LFTs  normal.  UA 7-10 white cells, red cells 0-2.  She has ruled out for MI  x3.   Note, I do not think this is ischemia, though was in the range of  possibilities.  Usually that pain would come with eating and not be so  intermittent or random.  The history is not all that helpful beyond the  fact that the viscous Xylocaine/GI cocktail ulcer leave things  suggesting a GI origin.      Iva Boop, MD,FACG  Electronically Signed     CEG/MEDQ  D:  01/05/2009  T:  01/06/2009  Job:  [id number]   cc:   Kerby Nora, MD

## 2010-11-24 NOTE — Assessment & Plan Note (Signed)
[facility] OFFICE NOTE   RYA, RAUSCH                     MRN:          [id number]  DATE:06/27/2008                            DOB:          1928/06/04    PRIMARY CARE PHYSICIAN:  Kerby Nora, MD   HISTORY:  [name] is a delightful 77 year old woman with history of  coronary artery disease status post non-ST-elevation myocardial  infarction in January 2007, treated with bare-metal stenting to the  obtuse marginal.  She has a totally occluded right coronary with distal  collateralization.  She has normal LV function.  She also has a history  of aortic stenosis, peripheral vascular disease with previous abdominal  aortic aneurysm repair and bilateral iliac stenting, carotid artery  stenosis, renal artery stenosis with an atrophic left kidney and a  moderate blockage on the right.  She also has hypertension and  hyperlipidemia.   She recently underwent cardiac catheterization for recurrent chest pain  that showed reversible moderate size basilar inferolateral and  anterolateral ischemia.  Catheterization showed nonobstructive disease  in the LAD.  The right coronary was chronically occluded.  In the mid AV  groove circumflex at a very tortuous part of the vessel, there was an 80-  90% lesion.  After review with the interventional team, we decided to  try medical therapy and see how she responds.  We have added Imdur to  her regimen.   She returns today for routine followup.  She feels much better.  She  continued to have occasional chest pain with moderate activity, but has  not had any in the last few days, and her breathing is much better.  She  has not had any orthopnea or PND.  She has not had palpitations or  syncope.  She did have a recent echocardiogram that showed an EF of 60%.  Her aortic valve was heavily calcified.  There was moderate-to-severe  aortic stenosis with a mean gradient of 19,  but a calculated valve area  of 0.88 cm squared.  This seems to be mildly progressed from before.   She has also had carotid ultrasound which showed stable disease.  There  was a 60-79% blockage on the right, 0-39% blockage on the left.   CURRENT MEDICATIONS:  1. Aspirin 81.  2. Metoprolol 12.5 b.i.d.  3. Plavix 75 a day.  4. Caltrate.  5. Synthroid 100 mcg a day.  6. Lipitor 80 a day.  7. Lisinopril 10 a day.  8. Imdur 30 a day.  9. Protonix 40 a day.   PHYSICAL EXAMINATION:  GENERAL:  She is a very pleasant elderly woman in  no acute distress.  She ambulates around the clinic without any  respiratory difficulty.  VITAL SIGNS:  Blood pressure is 128/72, heart rate is 55, weight is 180.  HEENT:  Normal except for some mild xanthelasmas.  NECK:  Supple.  There is no JVD.  Carotids are 1+ bilaterally with  bilateral bruits.  There is no lymphadenopathy or thyromegaly.  CARDIAC:  PMI is nondisplaced.  She is regular.  She has  a 3/6 systolic  ejection murmur at the right sternal border.  S2 is crisp, perhaps just  minimally depressed.  There is no gallop.  LUNGS:  Clear.  ABDOMEN:  Soft, nontender, nondistended.  No hepatosplenomegaly.  No  bruits.  No masses.  Good bowel sounds.  EXTREMITIES:  Warm with no cyanosis, clubbing, or edema.  NEUROLOGIC:  Alert and oriented x3.  Cranial nerves II through XII are  intact.  Moves all 4 extremities without difficulty.  Affect is  pleasant.   EKG shows sinus bradycardia at a rate of 55 with mild T-wave flattening  laterally.   ASSESSMENT AND PLAN:  1. Coronary artery disease.  She continues to have mild chronic stable      angina.  She has had improvement with Imdur.  Her stenosis in the      left coronary artery would be difficult to approach percutaneously.      I would like to continue medical therapy.  I will continue current      medications.  Beta-blocker dosing has been limited by bradycardia.      We will refer her for cardiac  rehab.  2. Hypertension, well controlled.  3. Hyperlipidemia.  This is followed by Dr. Ermalene Searing.  Goal LDL is less      than 70.  4. Peripheral vascular disease.  She will be due for repeat carotids      in 6 months to a year.  5. Aortic stenosis.  This has progressed mildly.  We will keep an eye      on this every 6 months to a year as well.   DISPOSITION:  We will see her back in 3 months to see how she is doing.     Bevelyn Buckles. [name], MD  Electronically Signed    DRB/MedQ  DD: 06/27/2008  DT: 06/28/2008  Job #: [id number]

## 2010-11-24 NOTE — Assessment & Plan Note (Signed)
[facility] OFFICE NOTE   [name], [name]                     MRN:          [id number]  DATE:06/23/2007                            DOB:          1927-07-15    PRIMARY CARE PHYSICIAN:  [name], M.D.   NEPHROLOGIST:  [name], M.D.   INTERVAL HISTORY:  [name] is a delightful 77 year old woman with a  history of coronary artery disease, status post non-ST elevation  myocardial infarction in January 2007, treated with bare metal stenting  to the obtuse marginal.  She also has a totally occluded right coronary  with distal collateralization.  Her LV function is normal.   The remainder of the past medical history is notable for:  1. Moderate aortic stenosis.  2. Severe peripheral vascular disease with previous abdominal aortic      aneurysm repair in 1995, with a residual thoracic aneurysm.  3. Status post bilateral iliac stenting in 2005.  4. Carotid stenosis.  5. Renal artery stenosis with an atrophic left kidney and about 0-60%      blockage in the right renal artery.  6. She also has hypertension.  7. Chronic renal insufficiency.  8. Hyperlipidemia.   She returns today for a routine followup.   She says she is doing great.  She denies any chest pain, shortness of  breath.  She has been following with Dr. Arrie Smith for her kidney  function and blood pressure have apparently been quite stable.  She has  not had any problems with heart failure.  She did have a carotid  ultrasound last month which showed 60-79% blockage on the right and 0-  39% blockage on the left.  This was stable from previous.   CURRENT MEDICATIONS:  1. Aspirin 81 a day.  2. Metoprolol 12.5 b.i.d.  3. Nexium 40 a day.  4. Plavix 75 a day.  5. Lipitor 80 mg a day.  6. Lisinopril 10 a day.  7. Synthroid 100 mcg a day.  8. Caltrate.   PHYSICAL EXAMINATION:  GENERAL:  She is an elderly woman in no acute  distress, ambulates around the clinic without any respiratory  difficulty.  VITAL SIGNS:  Blood pressure is 122/84, heart rate is 56, weight is 180.  HEENT:  Normal, except for xanthelasma.  NECK:  Supple.  There is no JVD.  Carotids are 2 plus bilaterally with  bilateral bruits.  There is no lymphadenopathy or thyromegaly.  CARDIAC:  PMI is nondisplaced.  She has a regular rate and rhythm with a  2/6 mid peaking systolic ejection murmur at the right sternal border.  S2 is just minimally depressed.  LUNGS:  Clear.  ABDOMEN:  Soft, nontender, nondistended.  There is no  hepatosplenomegaly.  No bruits.  No masses.  EXTREMITIES:  Warm with no cyanosis, clubbing, or edema. Distal pulses  are mildly diminished.  NEUROLOGIC:  She is alert and oriented x3.  Cranial nerves II-XII are  intact.  She moves all 4 extremities without difficulty.  Affect is  bright.   ASSESSMENT/PLAN:  1. Coronary artery disease, status  post previous myocardial      infarction.  She is doing well.  Continue current therapy.  2. Hyperlipidemia.  This is followed by Dr. Ermalene Smith.  Her Lipitor was      previously titrated.  Goal LDL less than 70.  3. Aortic stenosis.  This is stable.  She is due for a repeat      echocardiogram at the end of next year.  4. Peripheral arterial disease.  We will follow up with an abdominal      ultrasound to make sure her thoracic aneurysm is stable.  Also,      ultrasound of her kidneys to make sure her right renal artery      stenosis is not progressing as she only has one kidney.  5. Hypertension, well controlled.   DISPOSITION:  Overall, things look pretty good.  We will see her back in  9 months for a followup.  We will await the results of her tests.     [name]. [name], MD  Electronically Signed    DRB/MedQ  DD: 06/23/2007  DT: 06/23/2007  Job #: [id number]   cc:   Becky Nora, MD  [name], M.D.

## 2010-11-24 NOTE — H&P (Signed)
NAME:  [name], SCHOENFELDT NO.:  [id number]   MEDICAL RECORD NO.:  [id number]          PATIENT TYPE:  INP   LOCATION:  1829                         FACILITY:  [facility]   PHYSICIAN:  Marca Ancona, MD      DATE OF BIRTH:  [birth year]   DATE OF ADMISSION:  01/03/2009  DATE OF DISCHARGE:                              HISTORY & PHYSICAL   PRIMARY CARDIOLOGIST:  Bevelyn Buckles. [name], MD   PRIMARY CARE PHYSICIAN:  Amy Bledsoe.   [name] is a delightful 77 year old Caucasian female with multiple  medical problems.  She just saw Dr. Gala Romney in the [facility] office  on December 31, 2008, for a followup exam.  [name] was doing well at  that time.  She just finished up outpatient heart track, which is a form  of cardiac rehab at [facility] and had been feeling good.  Her only  complaint had been of some pelvic discomfort and she had seen Dr.  Pattricia Boss and was treated for UTI with Cipro.  In the last few days,  however, she has had some heaviness in the epigastric area that radiates  through to her back.  This is the same symptom she has had in the past  prior to her MI.  She called the office today complaining of the  discomfort as it felt to be worse today and was not brought on by any  change in activity or position.  She was instructed to come to the  emergency room to get further evaluation.  Here in the ER, blood  pressure was well controlled.  She was given two nitroglycerin  sublingual and 50 of fentanyl with resolution of her chest discomfort.  She states compliance with medications.  Her daughter is present with  her and is concerned that this is the same type of symptom she has had  in the past prior to her cardiac events and [name] indeed has  multiple cardiovascular diseases.   PAST MEDICAL HISTORY:  1. Coronary artery disease.      a.     Non-ST elevated MI in January 2007 with placement of a bare-       metal stent to the OM.  The patient was found to have  chronically       occluded RCA with distal collaterals.      b.     Cardiac catheterization in December 2009, which showed 80-       90% mid left circumflex lesion, which was thought to be high risk       for PCI.  Cardiac catheterization was done in the setting of       abnormal stress Myoview.  2. Aortic stenosis.  Most recent echocardiogram showed EF of 60%,      aortic valve heavily calcified, moderate-to-severe aortic stenosis,      this was in December 2009.  3. Peripheral vascular disease.      a.     Bilateral iliac stenting.      b.     Carotid artery stenosis.      c.  Renal artery stenosis with atrophic left kidney and moderate       blockages in the right kidney.      d.     Status post AAA repair, now 3.8 cm, pending repeat CT       angiogram this month actually.  4. Hypertension.  5. Diverticulitis.  6. Dyslipidemia.  7. Depression.  8. Hypothyroidism.  9. Chronic renal insufficiency.  The patient is followed by Dr.      Arrie Aran.   SOCIAL HISTORY:  She lives in [place].  She is a widow.  She has a  very attentive daughter.  The patient is a retired Catering manager.  She quit  smoking in 2007.  Prior to that, she smoked half a pack a day for 50  years.  She just completed heart track cardiac rehab.  Tries to follow a  heart healthy diet.  Denies any drug or herbal medicine use.   FAMILY HISTORY:  Father deceased at age 54 from MI.  He also had a  history of throat cancer.  Mother deceased at age 64 from MI.  Siblings  deceased from breast cancer and pancreatic cancer.   REVIEW OF SYSTEMS:  Positive for epigastric discomfort and some pelvic  discomfort.  All other systems reviewed and negative.   ALLERGIES:  1. CODEINE.  2. MORPHINE.  3. SULFA.  4. TORADOL.   MEDICATIONS:  1. Plavix 75.  2. Protonix 40.  3. Lipitor 80.  4. Aspirin 81.  5. Synthroid 100.  6. Metoprolol 12.5 b.i.d.  7. Vitamin D and calcium.  8. Lisinopril 10 mg daily.  9. Isosorbide  30 mg daily.  10.Fish oil 1000 mg t.i.d.   PHYSICAL EXAMINATION:  VITAL SIGNS:  The patient is afebrile,  temperature 96.7, heart rate 52, respirations 18, blood pressure one  117/75, sating 96% on room air.  GENERAL:  In no acute distress.  HEENT:  Unremarkable.  NECK:  Supple without JVD.  Positive bruits bilaterally.  CARDIOVASCULAR:  S1 and S2.  She has 3/6 mid-peaking systolic murmur.  LUNGS:  Slight bibasilar crackles, otherwise clear to auscultation.  SKIN:  Warm and dry.  ABDOMEN:  Soft and nontender.  No tenderness noted.  Positive bowel  sounds.  EXTREMITIES:  Lower extremities without clubbing, cyanosis, or edema.  NEUROLOGIC:  Alert and oriented x3.   Chest x-ray showing no acute findings.  EKG sinus rhythm at a rate of  61.  Point-of-care enzymes are negative.  INR is 1.1.  Creatinine 1.67,  potassium 3.7.  Hematocrit 36.2.   [name] is with no known coronary artery disease, peripheral arterial  disease, moderate aortic stenosis, and chronic kidney disease presenting  with epigastric discomfort on and off for several days.  Per patient and  daughter, this is her anginal equivalent.  The patient is now free after  nitroglycerin.  We will admit, cycle enzymes, nitroglycerin paste.  We  will reevaluate CT scan as the patient has known aneurysm and is  complaining of pelvic and chest discomfort radiating through to her  back.  Questionable repeat Myoview, we will discuss with Dr.  Gala Romney outpatient stress Myoview.  We will obtain a CT without  contrast, chronic kidney disease, mild bump in creatinine.  Gentle  hydration.  Repeat labs in the morning, also check LFTs and lipase while  here.  Dr. Marca Ancona has been in to examine and assess the patient  and agrees with plan of care.      Marcelino Duster  Bednar, ACNP      Marca Ancona, MD  Electronically Signed    MB/MEDQ  D:  01/03/2009  T:  01/04/2009  Job:  [id number]

## 2010-11-25 ENCOUNTER — Encounter: Payer: Self-pay | Admitting: Internal Medicine

## 2010-11-25 ENCOUNTER — Ambulatory Visit: Payer: Medicare Other | Admitting: Family Medicine

## 2010-11-27 ENCOUNTER — Ambulatory Visit (INDEPENDENT_AMBULATORY_CARE_PROVIDER_SITE_OTHER): Payer: Medicare Other | Admitting: Family Medicine

## 2010-11-27 ENCOUNTER — Encounter: Payer: Self-pay | Admitting: Family Medicine

## 2010-11-27 DIAGNOSIS — F3289 Other specified depressive episodes: Secondary | ICD-10-CM

## 2010-11-27 DIAGNOSIS — E039 Hypothyroidism, unspecified: Secondary | ICD-10-CM

## 2010-11-27 DIAGNOSIS — F320 Major depressive disorder, single episode, mild: Secondary | ICD-10-CM

## 2010-11-27 DIAGNOSIS — R5381 Other malaise: Secondary | ICD-10-CM

## 2010-11-27 DIAGNOSIS — F329 Major depressive disorder, single episode, unspecified: Secondary | ICD-10-CM

## 2010-11-27 DIAGNOSIS — I10 Essential (primary) hypertension: Secondary | ICD-10-CM

## 2010-11-27 DIAGNOSIS — R5383 Other fatigue: Secondary | ICD-10-CM

## 2010-11-27 DIAGNOSIS — R0602 Shortness of breath: Secondary | ICD-10-CM

## 2010-11-27 NOTE — Assessment & Plan Note (Signed)
[facility]                              CARDIOLOGY OFFICE NOTE   [name], [name]                     MRN:          [id number]  DATE:04/13/2006                            DOB:          12-Jan-1928    PRIMARY CARE PHYSICIAN:  Dr. Ermalene Searing at [facility].   PATIENT IDENTIFICATION:  [name] is a very pleasant 77 year old woman who  presents for routine followup.   PROBLEM LIST:  1. Coronary artery disease.      a.     Status post non-ST-elevation myocardial infarction January 2007       with bare-metal stenting to the obtuse marginal.      b.     Residual coronary artery disease including a totally occluded       right coronary artery with distal collateralization.      c.     Normal LV function.  2. Mild to moderate aortic stenosis by echocardiogram, mean gradient of      15.  3. Severe peripheral vascular disease.      a.     Status post abdominal aortic aneurysm repair in 1995 with a       residual lower thoracic aortic aneurysm which is stable.      b.     Status post bilateral external iliac stenting in 2005.      c.     Carotid ultrasound January 2007 with 0-39% blockage bilaterally.  4. Chronic renal insufficiency with atrophic right kidney.  5. History of tobacco use, quit in January 2007.  6. Hypertension.  7. Hyperlipidemia.  8. Hypothyroidism.   CURRENT MEDICATIONS:  1. Aspirin 81.  2. Plavix 75.  3. Metoprolol 12.5 b.i.d.  4. Synthroid 100 mcg a day.  5. Lisinopril 5 mg a day.  6. Lipitor 40 a day.  7. Nexium 40 a day.   ALLERGIES:  CODEINE and SULFA.   INTERVAL HISTORY:  [name] returns today for routine followup.  She is  doing quite well.  She is walking in the mall without any chest pain or  shortness of breath.  She denies claudication.  She did have an admission to  the hospital several months ago for chest and epigastric pain.  This was  thought to be reflux disease.  This has not recurred.   She has remained  abstinent from tobacco.   PHYSICAL EXAMINATION:  GENERAL:  She is well-appearing, no acute distress.  Ambulates around the clinic without any respiratory difficulty.  VITAL SIGNS:  Blood pressure is 118/78, her pulse is 57, her weight is 153.  HEENT:  Sclerae anicteric, EOMI.  There are scattered xanthelasmas.  Mucous  membranes are moist.  NECK:  Supple.  There is no JVD.  Carotids are 2+ bilaterally with soft  bruits bilaterally.  There is no lymphadenopathy or thyromegaly.  CARDIAC:  Has got a regular rate and rhythm, a 2/6 systolic ejection murmur  at the right upper sternal border as well as the left upper sternal border.  S2 is well preserved.  LUNGS:  Clear.  ABDOMEN:  Soft, nontender, nondistended.  There is no hepatosplenomegaly, no  bruits, no masses appreciated.  EXTREMITIES:  Warm with no cyanosis, clubbing or edema.  Dorsalis pedis are  1+ bilaterally.  There is no evidence of ischemia.  NEUROLOGIC:  She is alert and oriented x3.  Cranial nerves II-XII are  intact.  She moves all four extremities without difficulty.   ASSESSMENT AND PLAN:  1. Coronary artery disease.  She is doing quite well without any evidence      of recurrent ischemia.  We will continue her current therapy.      Unfortunately, given her bradycardia, we are unable to titrate up her      beta blocker.  2. Hyperlipidemia.  Her LDL was markedly elevated.  We have switched her      over to Lipitor 40 a day.  We will check her fasting lipids in 3      months.  I have cautioned her that she needs to be much more careful      with her diet.  I suspect we are going to need to increase her Lipitor      to 80 to get her LDL under 70.  3. Aortic stenosis is stable.  She needs a followup echocardiogram in 1      year.  4. Thoracic aortic aneurysm.  Recent CT scan showed that this was stable.      She will need yearly followup on this as well.       Becky Buckles. [name], MD      DRB/MedQ  DD:  04/13/2006  DT:  04/14/2006  Job #:  [id number]   cc:   William A. Leveda Anna, M.D.

## 2010-11-27 NOTE — Discharge Summary (Signed)
[name], [name]              ACCOUNT NO.:  [id number]   MEDICAL RECORD NO.:  [id number]          PATIENT TYPE:  INP   LOCATION:  2038                         FACILITY:  [facility]   PHYSICIAN:  Benn Moulder, M.D.      DATE OF BIRTH:  1928/03/13   DATE OF ADMISSION:  07/26/2005  DATE OF DISCHARGE:  07/31/2005                                 DISCHARGE SUMMARY   CONSULTATIONS:  1.  Arvilla Meres, MD, [facility].  2.  Charlies Constable, MD, [facility].   DISCHARGE DIAGNOSES:  1.  Non-ST-segment elevation myocardial infarction.  2.  Hypertension.  3.  Hyperlipidemia.  4.  Chronic renal insufficiency.  5.  Hypothyroidism.   DISCHARGE MEDICATIONS:  1.  Synthroid 88 mcg p.o. daily.  2.  Vytorin 10/80 one tablet p.o. daily.  3.  Plavix 75 mg p.o. daily.  4.  Aspirin 81 mg p.o. daily.  5.  Lopressor 12.5 mg p.o. q.12 h.  6.  Caltrate 600 mg p.o. daily.  7.  Lexapro 10 mg p.o. daily.  8.  Colace 1 pill b.i.d. p.r.n. constipation.  9.  Milk of magnesia p.r.n. constipation.  10. Metamucil for fiber.   PROCEDURES:  1.  Chest x-ray showed no acute cardiopulmonary findings.  There were 2      vague nodular lung densities.  Recommend follow up with chest CT or      chest x-ray in 3 to 4 weeks.  2.  Cardiac catheterization through right brachial artery on July 27, 2005, showed 70% mid stenosis in the circumflex artery and 95% stenosis      in the marginal branch with irregularities in the left anterior      descending and total occlusion of the right coronary artery.      Unsuccessful attempt at percutaneous transluminal coronary angioplasty      of the lesion due to inability to access across the lesion.  3.  Cardiac catheterization on July 29, 2005, successful percutaneous      coronary artery intervention of the lesion in the circumflex marginal      vessel using a MINI VISION bare metal stent with improvement in the      sentinel narrowing from 95% to 0%.  4.  ECG showed a normal sinus  rhythm with ST depression in leads V2-V4.   LABORATORY DATA:  Point of care enzymes, myoglobin 229 to 201, CK-MB went  from 4.6 to 4.9, troponin went from 2.16 to 2.09.  Urinalysis negative for  nitrite or leukocyte esterase.  TSH elevated at 17.59 and also elevated  again at 26.9.  Electrolyte panel on admission with sodium 138, potassium  4.0, chloride 107, bicarbonate 25, glucose 116, BUN 28, creatinine 1.3,  albumin 3.5.  D-dimer 3.40.  PT 13.5, INR 1.0, PTT 27.  White blood cell  count 11.6, hemoglobin 14.6, hematocrit 39.5, platelet count 169.  Hemoglobin at discharge was 11.7, hematocrit 33.6.  LFTs were within normal  limits.  Amylase was elevated at 178.  Lipase was elevated at 52.  Cardiac  enzymes showed troponin went from 2.32  to 1.26 and remained elevated with  last markers elevated at 1.35 on July 29, 2005.  A lipid profile showed  total cholesterol 136, HDL 52, LDL 69, triglycerides 74.   BRIEF HISTORY OF PRESENT ILLNESS:  The patient is a 77 year old female with  a history of peripheral arterial disease status post abdominal aortic  aneurysm repair in 1985 as well as mild to moderate aortic stenosis with  preserved LV ejection fraction as well as hyperlipidemia, hypertension,  COPD, and hypothyroidism but no history of coronary artery disease who  presented to the emergency department complaining of a 2-day history of  intermittent chest discomfort described similar to indigestion although  radiating to the right arm with episodes lasting up to an hour at a time.  She also felt fatigued and somewhat short of breath.  She was found to have  elevated troponin point of care markers with troponin elevated at 2.16.  ECG  showed lateral ST-segment depression.  She was admitted to family practice  teaching service and admitted to the cardiac care unit.   HOSPITAL COURSE:  1.  Non-ST-segment elevated myocardial infarction.  The patient was admitted      to the cardiac care  unit.  She was started on beta blocker, Lopressor      12.5 mg b.i.d.  She was also started on aspirin 325 mg as well as Plavix      75 mg once daily after a 300-mg load.  The patient was also started on a      heparin drip as well as a nitroglycerin drip which controlled her pain.      She was also started on Protonix for GI prophylaxis.  The patient was      taken to cardiac catheterization on July 27, 2005, which showed 70%      mid stenosis of her circumflex artery and 95% stenosis in the marginal      branch.  Cardiology was unable to place a percutaneous stent.  She was      taken back to cardiac catheterization on July 29, 2005, and this      time, using the right femoral approach, cardiology was able to place a      percutaneous coronary stent.  The patient was chest pain free at the      time of discharge.  She was discharged home on aspirin, Plavix, as well      as a beta blocker.   1.  Hypothyroidism.  The patient's TSH was elevated during this hospital      admission.  The patient's medications have recently been adjusted by her      primary care physician, Dr. Ermalene Searing, and will continue to be adjusted as      an outpatient.   1.  Hyperlipidemia.  The patient's LDL was at goal of less than 70.  The      patient will continue on her Vytorin 10/80 as an outpatient.   1.  Hypertension.  Blood pressure at goal on discharge on Lopressor.   1.  Chronic renal insufficiency.  The patient's creatinine was 1.3 on      admission, and it was 1.3 at discharge.  The patient's creatinine did      not increase with dye load from cardiac catheterization.   1.  Gastrointestinal.  The patient started Protonix for GI prophylaxis.  The      patient had vomiting early in the course as well as an elevated amylase  and lipase.  The patient's vomiting and abdominal pain subsided during      her hospital admission.  Nausea and vomiting were felt secondary to non-      ST-segment elevation  MI.  FOLLOWUP PLANS:  The patient is to follow up with Dr. Ermalene Searing at [facility], phone [phone].  She will call for an appointment.      Benn Moulder, M.D.    MR/MEDQ  D:  10/02/2005  T:  10/05/2005  Job:  [id number]

## 2010-11-27 NOTE — H&P (Signed)
NAME:  [name], [name]              ACCOUNT NO.:  [id number]   MEDICAL RECORD NO.:  [id number]          PATIENT TYPE:  INP   LOCATION:  5152                         FACILITY:  [facility]   PHYSICIAN:  Kerby Nora, MD        DATE OF BIRTH:  1928-02-17   DATE OF ADMISSION:  05/05/2005  DATE OF DISCHARGE:                                HISTORY & PHYSICAL   CHIEF COMPLAINT:  Abdominal pain.   HISTORY OF PRESENT ILLNESS:  [name] is a 77 year old female with recent  UTI, abdominal aneurysm, hypothyroidism, hyperlipidemia, hypertension,  peripheral vascular disease, and anxiety/depression who presents with  diffuse abdominal pain and constipation. Three weeks ago, the patient  presented to her primary care physician at [facility] with diffuse  lower abdominal pressure and intermittent sharp pains radiating to her lower  back. She was diagnosed with a UTI and possible kidney stones. She saw a  urologist and underwent CT scan at that time. The family is unclear of the  results.   Her abdominal pain improved over the course of one week. Then the patient  presented to [facility] on April 28, 2005 with the same pain as  she had previously. She was treated with Macrobid 100 mg x7 days. The pain  improved until today. The pain is constant, diffuse pressure especially in  the lower abdomen with intermittent sharp pain. Today, she was nauseous,  clammy, and had chills with the pain. She vomited clear mucus streaked with  red and called 9-1-1. She has had a 28 pound weight loss over the past one  month with decreased appetite, early satiety and a feeling of bloating with  eating. Her bowel movements were regular one per day until April 27, 2005  when she began to experience extremely small, hard flecked stools. No  diarrhea and no blood in stool. The patient reports fever to 100.0 last  Friday. No other fever noted, although the patient has had chills. She  denies chest pain and  shortness of breath. The patient reports feeling of  organs coming out of the vagina beginning about three weeks ago. She denies  dysuria or vaginal bleeding. She denies confusion and this is corroborated  by her daughter. She does report some dizziness and lightheadedness.   At the [facility] ED today, the patient's pain was controlled with  morphine and Dilaudid IV q.2h. On rectal exam, stool was palpated. A CT scan  showed constipation. A rectal tube was placed and a Fleets enema was given  with no relief.   PAST MEDICAL HISTORY:  1.  Hypertension.  2.  Hypothyroidism.  3.  Hypercholesterolemia.  4.  PVD.  5.  Anxiety/depression.  6.  Hemorrhoids.  7.  History of AAA.  8.  New aortic aneurysm diagnosed in October 2006 of 4 cm.  9.  Chronic renal insufficiency with a GFR of 45%. This is secondary to      peripheral vascular disease.   PAST SURGICAL HISTORY:  1.  AAA repair in 1995.  2.  Umbilical hernia repair in 1995.  3.  Revision of AAA repair in 1999.  4.  Cholecystectomy.  5.  Bilateral leg stents placed secondary to PVD in 2005.   MEDICATIONS:  1.  Synthroid 88 mcg p.o. daily.  2.  Lotensin 10/12.5 mg p.o. daily.  3.  Aspirin 81 mg p.o. daily.  4.  Vytorin 10/80 mg p.o. daily.  5.  Caltrate 600 mg p.o. daily.  6.  Macrobid 100 mg p.o. b.i.d. x7 days. She is on day 6-1/2.  7.  Xanax 0.25 mg q.h.s. p.r.n. anxiety.  8.  Sorbitol x1 day.  9.  Zantac p.r.n.  10. Lexapro prescribed but not taking.   ALLERGIES:  CODEINE causes nausea and SULFA causes nausea.   FAMILY HISTORY:  Father is deceased at age 73. He had a MI and also had  throat cancer. Mother is deceased at 90 of a MI. Sister had pancreatic  cancer and breast cancer. The patient has brothers and sisters with high  cholesterol.   SOCIAL HISTORY:  The patient is a retired Catering manager. She lost her husband  one month ago. She lives alone in her home and drives. She is independent  with her ADL's. Has  been staying with her daughter and son-in-law for three  weeks after her first episode of abdominal pain. She smokes a half pack per  day x50 years. She has no alcohol history.   REVIEW OF SYSTEMS:  CONSTITUTIONAL:  A 28 pound weight loss over the past  month. She had a fever to 100.1. Positive for chills, lightheadedness,  negative for confusion. HEENT:  She is negative for URI symptoms and no  difficulty swallowing. CARDIOVASCULAR:  No chest pain. PULMONARY:  No  shortness of breath, no cough, no wheezing. ABDOMINAL:  Pain, nausea,  vomiting, constipation per HPI. Positive for reflux symptoms. GU:  No  dysuria. MUSCULOSKELETAL:  No joint or muscle pain.   PHYSICAL EXAMINATION:  VITAL SIGNS:  Temperature maximum 97.9, pulse 69 to  91, blood pressure 113 to 125/39 to 57. Respiratory rate 16 to 30. Oxygen  saturation 94-96% on room air.  GENERAL:  Alert and oriented x3. The patient is sleeping and in no apparent  distress.  HEENT:  Pupils are equal, round, and reactive to light and accommodation.  Extraocular movements intact. Moist mucus membranes.  NECK:  No adenopathy. Thyroid within normal limits.  CARDIOVASCULAR:  Regular rate and rhythm. A 3/6 systolic murmur. No edema.  There are 2+ pulses.  LUNGS:  Clear to auscultation bilaterally.  ABDOMEN:  Tinkling bowel sounds in all four quadrants. Tender to palpation  in all four quadrants. Liver within normal limits on scratch test.  GU:  No uterine prolapse noted. No adnexal masses palpated.  RECTAL:  Positive stool in rectal vault. No hemorrhage noted.  EXTREMITIES:  No edema. Strength is 5/5 bilaterally.  NEUROLOGICAL:  Cranial nerves II through XII intact are intact.   LABORATORY DATA:  A CBC showed a white blood count of 15.8, H&H of 15.7 and  47, and a platelet count of 155,000. Sodium was 140, potassium was 3.6,  chloride is 105, bicarbonate is 22, BUN 21, creatinine elevated at 1.5. blood sugars 136, calcium 10.1, total protein  is 6.7, albumin 3.9, AST 23,  ALT 14, alkaline phosphate was 61, and bilirubin was 0.9. The patient had  hemoccult positive stool. The patient had a lipase of 47. A UA showed a  small amount of hemoglobin and a urine microscopy showed 3 to 6 RBC's and  rare bacteria.  CT of abdomen and pelvis showed diffuse colonic distension consistent with  colonic dysmotility/atonia. No evidence of mechanical obstruction. There is  air in the colon wall and a very short segment of the cecum and ascending  colon. There is bilateral renal scarring, mild intrahepatic biliary duct  scarring, 4 cm aortic aneurysm left of diaphragmatic hiatus. There is a  question of a small bladder stone.   ASSESSMENT:  This is a 77 year old female with abdominal pain, ileus, acute  renal failure, and mild dehydration.   PLAN:  1.  Abdominal pain/ileus:  It is unclear if ileus is entire cause of      abdominal pain. Also in the differential diagnosis for abdominal pain      are infectious causes such as appendicitis for diverticulitis, although      the patient has no fever and CT scan was not consistent with abscess or      diverticula. Pancreatitis not likely due to normal lipase. Malignancy is      also in the differential given recent weight loss and heme positive      stool. Mesenteric ischemic could lead to both the pain and ileus and air      in colon wall was noted on CT. On CT, ileus appears to be functional and      not obstructive but there could still be a mass hidden in the colon      causing the ileus. Hypothyroidism is another potential cause of ileus.      Medications are an unlikely cause as she has not been on any medications      that cause ileus and has had no recent course of narcotics. Viral      gastroenteritis or Clostridium difficile are unlikely as the patient has      no diarrhea. We will admit the patient, keep her n.p.o. and control her      pain with morphine 1 mg intravenously q.2h. p.r.n.  and Phenergan 12.5 mg      intravenously q.4-6h. p.r.n. We will check a thyroid-stimulating      hormones and start Dulcolax. We will get a KUB in the morning. Cardiac      cause of her abdominal pain is unlikely but we will check an      electrocardiogram in the morning. We will do serial abdominal exams.  2.  Acute renal failure:  The patient normally has chronic renal failure      with a glomerular filtration rate of about 45% due to a nonfunctional      left kidney secondary to peripheral vascular disease. Creatinine was 1.2      on April 28, 2005 and now it is 1.5. This is possibly due to      dehydration. We will hydrate with fluids at 125 mL/per hour. We will      discontinue her Lotensin and check a BMET in the morning.  3.  Hypothyroidism:  We will continue Synthroid 88 mcg daily for now and      check a thyroid-stimulating hormones.  4.  Hypertension:  Currently well controlled. We will monitor her blood     pressures and discontinue her Lotensin due to her acute renal failure.  5.  Hypercholesterolemia:  Continue Vytorin.  6.  Recent urinary tract infection:  Give final dose of Macrobid.  7.  Hematuria:  There is a questionable bladder stone on CT. Check BMET in      the morning. This is possibly  due to resolving urinary tract infection.      We will follow urinalysis.  8.  Leukocytosis:  Possibly due to resolving urinary tract infection. Follow      complete blood counts if trending up or down. Afebrile so hold      antibiotics for now.  9.  Systolic murmur:  Check 2-D echocardiogram to evaluate possible aortic      stenosis.  10. Depression and anxiety:  Continue Xanax. Start Lexapro on discharge.  11. Hemoccult positive stools concerning for gastrointestinal malignancy:      The patient will need a workup after constipation resolves.     ______________________________  Levander Campion, P.A.    ______________________________  Kerby Nora, MD    JH/MEDQ  D:   05/06/2005  T:  05/06/2005  Job:  [id number]

## 2010-11-27 NOTE — Patient Instructions (Addendum)
Work on exercise and weight loss. Stay active. Call if mood worsening. Consider B12 vitamin 1000 mcg daily. Keep appt as scheduled.

## 2010-11-27 NOTE — Cardiovascular Report (Signed)
NAME:  [name], [name] NO.:  [id number]   MEDICAL RECORD NO.:  [id number]          PATIENT TYPE:  INP   LOCATION:  2920                         FACILITY:  [facility]   PHYSICIAN:  Charlies Constable, M.D. [facility] DATE OF BIRTH:  August 04, 1927   DATE OF PROCEDURE:  07/27/2005  DATE OF DISCHARGE:                              CARDIAC CATHETERIZATION   CLINICAL HISTORY:  Ms. 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  She also is known to have mild to moderate aortic stenosis  and has one kidney.  She presented to the hospital with chest pain and  positive enzymes consistent with a non ST elevation myocardial infarction.  We planned to perform it by the break approach because of the aneurysm and  mural thrombus.   PROCEDURES:  The procedure was performed via the right brachial arteries and  arterial sheath and 6 [place] Castillia catheter number 2.  The left  ventriculogram was performed with a pigtail catheter.  The right coronary  with attempted with a JF-4 catheter, but was unable to be selectively  engaged.  After placement of __________  interventions of the circumflex  marginal vessel.   The vessel was quite tortuous and there was a quite tight lesion in the  marginal branch.  We used an AL-2 6-French guiding catheter with side holes  and we tried multiple wires and we were finally able to cross with a WHISPER  wire with the help of support from an over the wire balloon catheter.  After  crossing the with the WHISPER wire we were able to advance a 2.0 x 9.0 mm  balloon close enough to exchange for a PT moderate support wire in hopes  that we would be able to advance the balloon.  We tried multiple balloons in  attempt to advance around the multiple bands in the circumflex artery and  across the lesion, but were not able to do this.  We used a 1.5 x 12.0 mm  Maverick and a 2.0 x  9.0 mm Maverick.  We finally had to abandon further  attempts.  It was a long procedure but the patient tolerated the procedure  well and left the laboratory in stable condition.   RESULTS:  The left main coronary artery was free of disease.   The left anterior descending artery and __________ artery gave rise to a  large set of perforators and 2 diagonal branches.  Ability was irregular but  there was no major obstruction.   The __________  circumflex artery was quite tortuous and gave rise to a  large marginal branch and posterolateral branch.  Large marginal branch  bifurcated into two sub branches.  There was a 95% stenosis just before the  bifurcation and with some segmental disease extending into the inferior end.  There was also a 70% stenosis just before the deep takeoff in the marginal  branch.   The right coronary was completely occluded proximally although we did not  see this on plush injection.  This distal right coronary consisted of  posterior ascending and small posterior lateral branch which filled by  collaterals from the left coronary system.   The LEFT VENTRICULAR  and coronary artery objection showed good wall motion  with no areas of hypokinesis.  The estimated ejection fraction was 60%.   The left ventricular pressure is 153/6 and the aortic pressure was 142/71.  The peak aortic valve gradient was 11 mmHg.   CONCLUSION:  1.  Non ST elevation myocardial infarction with 70% mid stenosis in the      circumflex artery and 95% stenosis in a marginal branch, irregularities      in the LAD, and total occlusion of the right coronary artery (appears      old) with normal left ventricular function.  2.  Mild aortic stenosis with a peak aortic valve greater than 11 mmHg.  3.  Unsuccessful attempt at PTCA of the lesion in the circumflex marginal      vessel due to inability to access and cross the lesion with the balloon.   RECOMMENDATIONS:  Will plan initial medical  therapy.  We will consideration  to bypass surgery although the patient has multiple other problems.  We may  give consideration to another attempt at PCI from the leg. I will review  this with my colleagues and will decide about therapy over the next 24-48  hours.           ______________________________  Charlies Constable, M.D. [facility]     BB/MEDQ  D:  07/27/2005  T:  07/28/2005  Job:  [id number]   cc:   Arvilla Meres, M.D. [facility]  [facility]  [address]  [place]  [place] [address]   Charlies Constable, M.D. [facility]  [address]  [address]  [place]  [place] [address]   Cardiopulmonary Lab

## 2010-11-27 NOTE — Consult Note (Signed)
NAME:  [name], [name] NO.:  [id number]   MEDICAL RECORD NO.:  [id number]           PATIENT TYPE:   LOCATION:                                 FACILITY:   PHYSICIAN:  Jonelle Sidle, M.D. LHCDATE OF BIRTH:   DATE OF CONSULTATION:  DATE OF DISCHARGE:                                   CONSULTATION   REFERRING PHYSICIAN:  Leighton Roach McDiarmid, M.D.   PRIMARY CARDIOLOGIST:  Arvilla Meres, M.D. [facility]   REASON FOR CONSULTATION:  Chest pain and abnormal cardiac markers.   HISTORY OF PRESENT ILLNESS:  [name] is a 77 year old woman last seen in  the office by Dr. Gala Romney in early November with a history of peripheral  arterial disease, status post aortic abdominal aneurysm repair in 1985,  small lower thoracic aneurysm (3.8 cm) with circumferential mural thrombus  diagnosed in October 2006, episode of possible mesenteric ischemia, mild to  moderate aortic stenosis with preserved left ventricular ejection fraction,  atrophic left kidney with mild renal insufficiency, hyperlipidemia, chronic  obstructive pulmonary disease, hypothyroidism, and no clearly documented  history of coronary artery disease or myocardial infarction.  She denies any  prior stress testing or coronary angiography, and has been managed medically  without any significant symptoms of chest pain or shortness of breath.   She presented to the emergency department complaining of a two-day history  of intermittent chest discomfort described as being similar to indigestion,  although radiating to the right arm with episodes lasting up to an hour at a  time.  She felt fatigued and somewhat short of breath with these symptoms  and ultimately presented to the emergency department.  She was evaluated by  the family practice service and admitted with findings of abnormal troponin  I levels with point of care markers increased to 2.16, although standard  troponin I increased at only 0.6 with a CK-MB of 6.   The patient's  electrocardiogram shows sinus rhythm with lateral ST segment depression  which appears to be dynamic compared to prior tracings.  She is presently  symptom-free with nitroglycerin and heparin drips.  We have been consulted  to evaluate her further.   ALLERGIES:  1.  CODEINE.  2.  SULFA DRUGS.   PRESENT MEDICATIONS:  1.  Heparin and nitroglycerin drips.  2.  Protonix 40 mg IV daily.  3.  Synthroid 88 mcg p.o. daily.  4.  Vytorin 10/80 mg p.o. daily.  5.  Enteric-coated aspirin 81 mg p.o. daily.  6.  Calcium citrate 600 mg p.o. daily.  7.  Lexapro 10 mg p.o. daily.  8.  Xanax 0.25 mg p.o. b.i.d. p.r.n.  9.  She was apparently taking Lotensin/hydrochlorothiazide 5/12.5 mg p.o.      daily, although this was discontinued in December apparently due to      renal insufficiency.   PAST MEDICAL HISTORY:  As outlined in the history of present illness.  The  patient had a hospital stay from October 25 through November 1 most recently  on the family practice service during an episode of severe abdominal pain  with  ultimate findings of mesenteric ischemia.  The patient had a CT scan of  the abdomen which showed aortoiliac ectasia and significant diffuse  atherosclerosis including findings of the small lower thoracic aortic  aneurysm.  Since that time her abdominal discomfort has resolved.   SOCIAL HISTORY:  The patient lives in [place] and is recently widowed.  She is a retired [employer] manager and has a history of one quarter to one-half  pack of tobacco use for 50 years.  There is no significant alcohol use  history.   FAMILY HISTORY:  Significant for heart disease in both parents in their 104s.   REVIEW OF SYSTEMS:  As described in history of present illness.  She has not  had any fever or chills.  She denies cough, hemoptysis, melena,  hematochezia.  She apparently has a basal cell carcinoma on her left  anterior shin that has been biopsied recently.  She has some other   excoriations on her legs as well.  She has had no problems with  palpitations, claudication, lower extremity edema, or syncope.  Systems  otherwise negative.   PHYSICAL EXAMINATION:  VITAL SIGNS:  Blood pressure 120/78, heart rate 60  and regular, respirations nonlabored, the patient is not hypoxic.  GENERAL:  This is a pleasant, elderly woman in no acute distress denying  active chest pain.  NECK:  Soft left carotid bruit.  No hepatomegaly is noted.  LUNGS:  Generally clear with nonlabored breathing at rest.  CARDIAC:  Regular rate and rhythm with a 2-3/6 systolic murmur heard best at  the right base consistent with aortic valve disease.  S2 is preserved.  There is no S3, gallop, or pericardial rub.  ABDOMEN:  Soft and nontender.  No obvious hepatomegaly is noted.  EXTREMITIES:  No pitting edema.  Peripheral pulses are 1+.  MUSCULOSKELETAL:  No kyphosis is noted.  NEUROPSYCHIATRIC:  The patient is alert and oriented x3.  Affect is normal.   LABORATORY DATA:  WBC is 11.6, hemoglobin 14, hematocrit 39.5, platelets  169.  INR 1.  D-dimer 3.40.  Sodium 138, potassium 4, chloride 107, bicarb  25, BUN 28, creatinine 1.3, glucose 116.  Liver function tests are normal.  Amylase 178, lipase 52.  Standard troponin I 0.6 from a peak point of care  troponin I of 2.16.  CK-MB of 6.  Urinalysis shows trace leukocytes, many  squamous epithelial cells, and 0-2 white cells, few bacteria, moderate  blood.   Chest x-ray is pending.   IMPRESSION AND RECOMMENDATIONS:  1.  Recent symptoms concerning for unstable angina with subsequent minor      troponin I elevations and electrocardiographic changes consistent with      non-ST elevation myocardial infarction.  The patient is hemodynamically      stable and symptom-free at this point on IV nitroglycerin and heparin.      History is notable for significant peripheral arterial disease status     post abdominal aortic aneurysm repair in 1995 as well as  bilateral iliac      stents and findings of aortoiliac atherosclerosis by recent CT scan also      revealing a 3.8 cm lower thoracic aneurysm with circumferential mural      thrombus.  She denies any prior history of myocardial infarction or      cardiac testing.  I reviewed the situation with the patient and her      daughter.  Generally, cardiac catheterization would be pursued at this  point for firm diagnosis and assessment for revascularization      strategies.  I discussed the risks and benefits of this with them.  At      this point they would like to consider this further and perhaps discuss      the situation more with Dr. Gala Romney prior to proceeding. She would      have some increased risk given her baseline renal insufficiency and      thoracic aneurysm with thrombus, although these risks would likely not      be prohibitive to considering angiography.  For the timebeing would      continue medical therapy. We can certainly take this patient on our      service as well.  2.  Renal insufficiency, creatinine 1.3.  The patient was apparently taken      off of an ACE inhibitor in December.  We will plan to hydrate at this      point and observe.  Perhaps she has some element of renal artery      stenosis given her significant aortoiliac atherosclerosis as already      known.  3.  Hypertension, reasonably well-controlled.  4.  Hyperlipidemia, on statin therapy.  We will follow up fasting lipid      profile.  5.  Mild elevations of amylase and lipase are of uncertain significance.      Consider a followup abdominal ultrasound.  6.  Elevated D-dimer level of 3.4 is also of uncertain significance.  The      patient is not hypoxic or tachycardic and this may simply be      nonspecific.  The probability of a pulmonary embolus, although possible,      does not seem to be particularly high at this time.  In any event, she      is on heparin and this can be considered further if  warranted.  7.  Further plans to follow.           ______________________________  Jonelle Sidle, M.D. [facility]     SGM/MEDQ  D:  07/27/2005  T:  07/27/2005  Job:  [id number]   cc:   Arvilla Meres, M.D. [facility]  [facility]  [address]  [place]  [place] [address]   Etta Grandchild, M.D.  Fax: [phone]

## 2010-11-27 NOTE — Assessment & Plan Note (Signed)
[facility] OFFICE NOTE   RAYVON, BRANDVOLD                     MRN:          [id number]  DATE:07/06/2006                            DOB:          1928/06/21    PRIMARY CARE PHYSICIAN:  Dr. Kerby Nora   NEPHROLOGIST:  Dr. Lottie Rater   PATIENT IDENTIFICATION:  [name] is a very pleasant 77 year old woman  who presents for routine followup.   PROBLEM:  1. Coronary artery disease.      a.     Status post non ST elevation myocardial infarction January       2007 treated with a bare metal stent to the obtuse marginals.      b.     Residual coronary artery disease including a totally       occluded right coronary artery with distal collateralization.      c.     Normal LV function.  2. Mild to moderate aortic stenosis by echocardiogram, mean grade of      15.  3. Severe peripheral vascular disease.      a.     Status post abdominal aortic aneurysm repair in 1995 with a       residual lower thoracic aortic aneurysm which is stable.      b.     Status post bilateral external iliac stenting in 2005.      c.     Carotid ultrasound January 2007, 0 to 39% blockage       bilaterally.      d.     Renal ultrasound shows atrophic left kidney with the       suggestion of a 0 to a 59% blockage in the right renal artery.  4. Chronic renal insufficiency with atrophic left kidney followed by      Dr. Arrie Aran.  5. History of tobacco use, quit January 2007.  6. Hypertension.  7. Hyperlipidemia, severe.      a.     Most recent cholesterol December 2007.  Total cholesterol       180, triglycerides 142, HDL 48, and LDL 104 which is much       improved.      b.     Hypothyroidism.   CURRENT MEDICATIONS:  1. Aspirin 81.  2. Plavix 75.  3. Metoprolol 12.5 b.i.d.  4. Synthroid 100 mcg a day.  5. Cipro 5 a day.  6. __________  40 a day.  7. Nexium 40 a day.   INTERVAL HISTORY:  [name] returns today for  routine followup.  She  says she is feeling better everyday.  She denies any chest pain, no  shortness of breath.  She has not had any heart failure symptoms.  She  is able to tolerate her medications without any difficulties.  She has  remained abstinent from cigarettes.   PHYSICAL EXAMINATION:  She is an elderly woman who is well-appearing,  ambulates around the clinic without any respiratory difficulty.  Blood pressure is 130/80.  Heart rate of 62.  Her weight is 163.  HEENT:  Sclerae anicteric.  EOMI.  There is scattered xanthelasma.  Mucous membranes are moist.  Oropharynx is clear.  NECK:  Is supple.  No JVD.  Carotids are 2+ bilaterally with soft bruits  bilaterally, radiate off the aortic valve.  There is no lymphadenopathy  or thyromegaly.  CARDIAC:  Has a regular rate and rhythm, 2/6 mid peaking systolic  ejection murmur at the right upper sternal border, radiates across the  precordium.  S2 is well preserved.  LUNGS:  Are clear.  ABDOMEN:  Is soft, nontender, nondistended.  There is no  hepatosplenomegaly, no bruits, no masses appreciated.  EXTREMITIES:  Are warm with no cyanosis, clubbing or edema.  DP pulses  are 1+ bilaterally.  NEUROLOGIC:  She is alert and oriented x3.  Cranial nerves II through  XII are intact.  She moves all 4 extremities without difficulty.  Affect  is bright.   ASSESSMENT AND PLAN:  1. Coronary artery disease.  She is doing well without any evidence of      recurrent ischemia.  Continue current medical therapy.  2. Hyperlipidemia.  Low-density lipoprotein is better but still not at      goal.  We will increase her Lipitor to 80.  3. Aortic stenosis is stable.  We will repeat her echocardiogram in      April.  4. Thoracic aortic aneurysm.  Recent computerized tomography scan and      ultrasound show that this is stable.  She will need yearly follow      up.  5. Renal artery stenosis.  Once again this is stable.  We will need to      keep a  close eye on her right renal artery to make sure it is not      progressing.  Should there be any evidence of progression, we will      refer her to Dr. Samule Ohm.  6. Hypertension.  Blood pressure mildly elevated here, although it has      been well-controlled at home.  We will continue Coreg regimen.  We      can titrate her Lisinopril as needed.     [name]. [name], MD  Electronically Signed    DRB/MedQ  DD: 07/06/2006  DT: 07/06/2006  Job #: [id number]   cc:   Kerby Nora, MD  Terrial Rhodes, M.D.

## 2010-11-27 NOTE — Progress Notes (Signed)
  Subjective:    Patient ID: [name], female    DOB: 04/05/1928, 77 y.o.   MRN: [id number]  HPI  77 year old female presents today with worsening of ventral hernia. She has had this for 2 years, feels that it is getting more prominnent. Making it difficult for her to breathe. Saw Dr. Dwain Sarna..too high risk for surgery.  No shortness of breath at rest.  Hypothyroid, improved last check..she noted improvement but now feels tired a Lab Results  Component Value Date   TSH 0.31* 10/21/2010   But continued fatigue. Overwhelmed with all the following  Has been trying to eat right and exercise.  Has lost 15 lbs in the last 4 months, intentially.  Gout is causing her  Lots of problems. Seeing Dr. Dareen Piano, not responding to treatments initially. Now on doubled uloric..better now.   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 Tyrone Schimke. No improvement in fatigue.  She does note some problems with mood. No problems sleepoing at night.  Trying to stay active with family. Thinking about moving to where sister lives. Not interested in medication.   Review of Systems  Constitutional: Positive for fatigue.  HENT: Negative for ear pain and congestion.   Eyes: Negative for pain.  Respiratory: Positive for shortness of breath. Negative for cough and wheezing.   Cardiovascular: Negative for chest pain, palpitations and leg swelling.  Gastrointestinal: Positive for abdominal distention. Negative for abdominal pain, diarrhea, constipation and blood in stool.  Genitourinary: Negative for dysuria.      Objective:   Physical Exam  Constitutional: Vital signs are normal. She appears well-developed and well-nourished. She is cooperative.  Non-toxic appearance. She does not appear ill. No distress.  HENT:  Head: Normocephalic.  Right Ear: Hearing, tympanic membrane, external ear and ear canal normal. Tympanic membrane is not erythematous, not  retracted and not bulging.  Left Ear: Hearing, tympanic membrane, external ear and ear canal normal. Tympanic membrane is not erythematous, not retracted and not bulging.  Nose: No mucosal edema or rhinorrhea. Right sinus exhibits no maxillary sinus tenderness and no frontal sinus tenderness. Left sinus exhibits no maxillary sinus tenderness and no frontal sinus tenderness.  Mouth/Throat: Uvula is midline, oropharynx is clear and moist and mucous membranes are normal.  Eyes: Conjunctivae, EOM and lids are normal. Pupils are equal, round, and reactive to light. No foreign bodies found.  Neck: Trachea normal and normal range of motion. Neck supple. Carotid bruit is not present. No mass and no thyromegaly present.  Cardiovascular: Normal rate, regular rhythm, S1 normal, S2 normal, normal heart sounds, intact distal pulses and normal pulses.  Exam reveals no gallop and no friction rub.   No murmur heard. Pulmonary/Chest: Effort normal and breath sounds normal. Not tachypneic. No respiratory distress. She has no decreased breath sounds. She has no wheezes. She has no rhonchi. She has no rales.  Abdominal: Soft. Normal appearance and bowel sounds are normal. There is no tenderness.  Neurological: She is alert.  Skin: Skin is warm, dry and intact. No rash noted.  Psychiatric: Her speech is normal and behavior is normal. Judgment and thought content normal. Her mood appears not anxious. Cognition and memory are normal. She does not exhibit a depressed mood.          Assessment & Plan:

## 2010-11-27 NOTE — Cardiovascular Report (Signed)
NAME:  [name], [name]              ACCOUNT NO.:  [id number]   MEDICAL RECORD NO.:  [id number]          PATIENT TYPE:  INP   LOCATION:  2920                         FACILITY:  [facility]   PHYSICIAN:  Charlies Constable, M.D. [facility] DATE OF BIRTH:  Aug 28, 1927   DATE OF PROCEDURE:  07/29/2005  DATE OF DISCHARGE:                              CARDIAC CATHETERIZATION   PROCEDURE PERFORMED:  Percutaneous coronary intervention.   CLINICAL HISTORY:  [name] is 77 years old and was admitted with chest  pains and positive enzymes consistent with a non-ST-elevation myocardial  infarction.  She has had a previous abdominal aneurysm repair, and a CT in  October showed an aneurysm in the low thoracic area with a mural thrombus so  we performed catheterization and attempted PCI two days ago via the brachial  approach.  We were unable to complete the PCI successfully because of marked  tortuosity and inability to pass the balloon down to and across the lesion.  We brought the patient back today for an attempt from below using the right  femoral artery, thinking that we could get better guide support.  The  patient's creatinine was 1.4 and she was given a bicarb load.   PROCEDURE:  The procedure was performed via the right femoral artery using a  7-French CLS-4 guiding catheter with side holes.  We used a PT-2 moderate  support wire and we were able to advance the wire down to the lesion.  With  the help of a 2.0 x 9 mm over-the-wire Maverick balloon, we were able to  steer the wire down into the distal vessel.  We developed a small dissection  in the side branch with the wire before being able to pass the balloon down  to the proper position.  We performed approximately 4 inflations up to 10  atmospheres for 30 seconds with a 2.0 x 9-mm balloon.  We then upgraded to a  2.25 x 12-mm Maverick and performed several inflations up to 8 atmospheres  for 30 seconds, and then we upgraded to a 2.5 x 20-mm Maverick and  performed  2 inflations up to 8 atmospheres for 30 seconds.  The 20-mm balloon went  down fairly well so we felt we could get a stent down, so we chose a 2.25 x  18 mm mini-vision stent.  We deployed this in the lesion with 1 inflation of  14 atmospheres for 30 seconds and a second inflation of 16 atmospheres for  30 seconds.  There was a side branch that was crossed by the stent that was  compromised.  It had TIMI-1 flow, but this was not a very large vessel and  did not cause any pain.  Final diagnostic x-rays were then performed after  removal of the wire.  On the final pictures, we realized that there was a  dissection further up in the vessel at one of the bends.  This did not  appear obstructive and we elected not treat this with further PCI.  The  right femoral artery was closed with Angioseal at the end of the procedure.  CONCLUSION:  Successful percutaneous coronary intervention of the lesion in  the circumflex marginal vessel using a mini-vision bare metal stent with  improvement of sentinel narrowing from 95% to 0%.   DISPOSITION:  The patient is returned to __________for further observation.  In view of the edge tear proximal to the stent, we will plan to continue  Angiomax for about 3 hours.  Hopefully this will heal.  Currently it appears  to be nonobstructed.           ______________________________  Charlies Constable, M.D. [facility]     BB/MEDQ  D:  07/29/2005  T:  07/29/2005  Job:  [id number]   cc:   Arvilla Meres, M.D. [facility]  [facility]  [address]  [place]  [place] [address]   Cardiopulmonary Laboratory

## 2010-11-27 NOTE — Assessment & Plan Note (Signed)
[name]                           [facility] OFFICE NOTE   Becky, Smith                     MRN:          [id number]  DATE:06/08/2006                            DOB:          10-18-1927    CHIEF COMPLAINT:  A 77 year old white female here to establish a new  doctor.   HISTORY OF PRESENT ILLNESS:  [name] is a previous patient of mine at  [facility].  She has transferred her care to [facility] at  [facility].  She states that she has been doing very well.  Her only  complaint since I saw her last in February of 2007 at [facility] is that she was hospitalized for chest pain that was determined  to be reflux.  She has been seeing Dr. Gala Romney, her cardiologist,  regularly.  Her cholesterol is due to be checked by Dr. Gala Romney at the  end of December.  She will forward the results to me.   She also has very well controlled high blood pressure.  She continues to  take lisinopril and metoprolol without side effects.   Her gastroesophageal reflux disease is well controlled on Nexium 40 mg  daily.   Her hypothyroidism was evaluated recently at [facility].  She has continued on levothyroxine 100 mcg daily without side effects.   REVIEW OF SYSTEMS:  Otherwise negative.   PAST MEDICAL HISTORY:  1. 1980 - skin cancer, basal cell carcinoma on nose.  2. Apr 18, 2005- depression after death of spouse.  3. GERD.  4. Hypertension.  5. Hypercholesterolemia.  6. Hypothyroidism.  7. Chronic renal insufficiency secondary to only having right kidney      function; left kidney function thought to be damaged secondary to      AAA.  8. Peripheral vascular disease.   HOSPITALIZATIONS/SURGERIES/PROCEDURES:  1. 1987 - cholecystectomy.  2. 1996 - breast biopsy, negative.  3. 1954 - appendectomy.  4. 1943 - tonsillectomy.  5. 1995 - AAA repair at [facility].  6. 1997 - abdominal hernia repair.  7.  2005 - stent placement in bilateral legs for peripheral vascular      disease.  8. October 2006 - hospitalized for possible mesenteric ischemia.  9. January of 2007 - MI with stent placed.  10.July of 2007 - hospitalized for reflux.   ALLERGIES:  1. CODEINE.  2. SULFA.  3. MORPHINE.  (Causing rash, nausea and vomiting.)   MEDICATIONS:  1. Levothyroxine 100 mcg daily.  2. Nexium 40 mg daily.  3. Plavix 75 mg daily.  4. Tandem Plus 106/1 mg daily.  5. Iron and vitamin D daily.  6. Vitamin D 1.25 mg one per week x4 weeks, then one per month x6      months.  7. Lipitor 40 mg daily.  8. Lisinopril 10 mg one-half tablet daily.  9. Metoprolol 25 mg one-half tablet p.o. b.i.d.  10.Caltrate 600 mg with vitamin D plus daily.  11.EC ASA 81 mg daily.   HEALTH MAINTENANCE:  1. Last mammogram in April of 2007 was negative.  2.  Last colonoscopy in November of 2006 was negative.  3. Recent vaccines - Pneumovax and influenza in November of 2007.   FAMILY HISTORY:  Father deceased at age 21 with MI and throat cancer.  Mother deceased at age 41 with myocardial infarction.  No MI less than  age 40.  She has 1 brother and 2 sisters who are fairly healthy, as far  as she knows.  There is no cancer in the family.   SOCIAL HISTORY:  She is retired and widowed for approximately 1-1/2  years.  She lives on her own.  Her son lives behind her, and there are  multiple family members close by.  She feels safe at home.  She is a  former smoker.  She denies alcohol and drug abuse.   PHYSICAL EXAMINATION:  VITAL SIGNS:  Height not measured.  Weight 166,  blood pressure 122/80, pulse 60, temperature 97.8.  GENERAL:  A healthy-appearing female who appears appropriate for age, in  no apparent distress.  HEENT:  Pupils equal, round and reactive to light and accommodation.  Extraocular muscles intact.  Oropharynx clear.  Tympanic membranes  clear.  Nares clear.  No thyromegaly.  No lymphadenopathy,   supraclavicular or cervical.  PULMONARY:  Clear to auscultation bilaterally.  No wheezes, rales or  rhonchi.  CARDIOVASCULAR:  3/6 systolic murmur heard greatest at right upper  sternal border.  Normal PMI.  There were 2+ peripheral pulses.  No  peripheral edema.  ABDOMEN:  Soft, nontender.  Normoactive bowel sounds.  No  hepatosplenomegaly.  MUSCULOSKELETAL:  Strength 5/5 in upper and lower extremities.  NEUROLOGIC:  Cranial nerves II-XII grossly intact.  Reflexes 2+.  Sensation intact.   ASSESSMENT AND PLAN:  1. Hypertension, stable and well controlled.  Continue on lisinopril      and metoprolol.  2. Hypercholesterolemia.  Will be checked soon by Dr. Gala Romney.  The      patient will continue on 40 mg daily.  3. Gastroesophageal reflux disease, stable.  Continue Nexium 40 mg      daily.  4. Depression, resolved.  5. Hypothyroidism, stable.  Continue on levothyroxine 100 mcg daily.      Will obtain last TSH from the family practice center.  6. Prevention.  At this point in time, she is up to date with      prevention, except for possibly her last tetanus, which is unsure      of.  I will obtain records from the family practice to determine if      anything else is due.  She did state that she had a bone density      scan done in the past.  Her renal doctor does not want her to use      Boniva or Fosamax secondary to the possible kidney adverse      reactions.  She will once a day continue on calcium and vitamin D      as directed by him.     Kerby Nora, MD  Electronically Signed    AB/MedQ  DD: 06/08/2006  DT: 06/09/2006  Job #: [id number]

## 2010-11-27 NOTE — Discharge Summary (Signed)
NAME:  [name], [name]              ACCOUNT NO.:  [id number]   MEDICAL RECORD NO.:  [id number]          PATIENT TYPE:  INP   LOCATION:  3741                         FACILITY:  [facility]   PHYSICIAN:  Santiago Bumpers. Hensel, M.D.DATE OF BIRTH:  1927-08-18   DATE OF ADMISSION:  DATE OF DISCHARGE:  01/28/2006                                 DISCHARGE SUMMARY   DISCHARGE DIAGNOSES:  1.  Gastroesophageal reflux disease.  2.  Urinary tract infection.  3.  Orthostatic hypotension.  4.  Anemia.  5.  Hyperlipidemia.  6.  Hypothyroidism.  7.  Hypertension.  8.  Anxiety.  9.  Coronary artery disease.  10. Abdominal aortic aneurysm.  11. Peripheral vascular disease.   DISCHARGE MEDICATIONS:  1.  Keflex 500 mg twice daily x7 days.  2.  Aspirin 81 mg daily.  3.  Desipramine 50 mg nightly.  4.  Plavix 75 mg daily.  5.  Metoprolol 12.5 mg twice daily.  6.  Synthroid 100 mcg daily.  7.  Zocor 20 mg nightly.  8.  Protonix 40 mg twice daily.  9.  Lisinopril 5 mg daily.  10. Niferex 1 capsule twice daily.  11. Caltrate 600 mg daily.   DISCHARGE INSTRUCTIONS:  The patient is follow up with Dr. Seleta Rhymes at the  [facility] on 02/02/2006 at 10:15 am.   CONSULTATIONS:  None.   PROCEDURE:  CT scan showed a triple-A that was stable with no leakage or  dissection.   HOSPITAL COURSE:  Briefly, [name] is a 77 year old female with a history  of coronary artery disease, status post MI in January 2007, abdominal aortic  aneurysm, hypertension, hyperlipidemia, anxiety, hypothyroidism, and chronic  renal failure who presented with epigastric pain for several days.  Her  daughter brought her to the ED for evaluation.  She denies fever, shortness  of breath, chest pain, dyspnea on exertion, dysuria, frequently, or urgency.  She did admit to nausea and vomiting x1.  She also reports feeling weak upon  standing and sweating upon standing.    1.  Epigastric pain.  The patient gave a  history of increased pain after      eating, for several days.  This actually coincided with the start of      iron therapy.  We suspected that the pain was secondary to GERD      exacerbation as it was relieved by a GI cocktail in the emergency      department.  However, given her history of coronary artery disease and      triple-A, we ruled out cardiac causes by getting cardiac enzymes, which      were negative x3.  A CT scan showed no leakage of her triple-A and no      dissection.  And EKG showed no changes.  Amylase was increased to 210,      but this is nonspecific for pancreatitis and is chronically elevated in      the patient.  We started Protonix 40 mg b.i.d. for a GERD exacerbation      and her symptoms resolved.  We had changed her iron to Niferex to be      more gentle on her stomach.  2.  Cardiovascular disease.  Ruled for cardiac cause of epigastric pain.      She was continued on her Lopressor.  Her Lisinopril was decreased to 5      mg daily.  Continued on her aspirin.  She was continued on her Plavix      and Zocor.  3.  Urinary tract infection.  The patient was recently diagnosed with a UTI      at her nephrologist, Dr. Hadley Pen office, and finished a 5-day      course of Cipro.  In the ED she was found to have a UTI on urinalysis.      It was unclear if this was recurrent versus unsuccessfully treated.  The      patient was on iron and calcium during treatment with Cipro and this can      decrease the effectiveness of Cipro.  We got a urine culture and began      another course of Cipro 250 mg b.i.d.  She also received Rocephin x1 in      case her infection was resistant to Cipro.  Her urine culture showed      greater than 100,000 colonies of gram-negative rods that were sensitive      to Cipro and Keflex.  Keflex was given to finish the course of treatment      for 7 days.  The patient remained asymptomatic.  4.  Orthostatic hypotension.  The patient reported  being weak when she      stands up and would sweat when she would stand up and we suspected that      these were vagal signs.  She was found to be orthostatic by blood      pressure.  We increased her IV fluids and gave her a 500 mL bolus.  We      also decreased her Lisinopril to 5 mg daily and discontinued her      Norvasc, as her blood pressure was within normal limits during the      hospitalization.  Her orthostatic hypotension resolved and her blood      pressure remained within normal limits.  5.  Chronic renal failure.  The patient had a creatinine of 1.5 on      admission.  Her baseline is 1.4.  We suspect that she was dehydrated.      Fluids were started for 24 hours and her creatinine decreased to her      baseline of 1.4.  6.  Hypothyroidism.  Her TSH was within normal limits and Synthroid was      continued at her home dose.  7.  Hypertension.  Her blood pressure was within normal limits during the      hospital stay and tolerated her change in medication.     ______________________________  Levander Campion, M.D.    ______________________________  Santiago Bumpers. Leveda Anna, M.D.    JH/MEDQ  D:  01/28/2006  T:  01/28/2006  Job:  [id number]   cc:   Benn Moulder, M.D.  Fax: [phone]   Arvilla Meres, M.D. [facility]  [employer]  [address]  [place]  [place] [address]   Terrial Rhodes, M.D.  Fax: [phone]

## 2010-11-27 NOTE — Discharge Summary (Signed)
NAME:  [name], LOJA NO.:  [id number]   MEDICAL RECORD NO.:  [id number]          PATIENT TYPE:  INP   LOCATION:  5034                         FACILITY:  [facility]   PHYSICIAN:  Asencion Partridge, M.D.     DATE OF BIRTH:  [birth year]   DATE OF ADMISSION:  05/05/2005  DATE OF DISCHARGE:  05/12/2005                                 DISCHARGE SUMMARY   SERVICE:  Family practice teaching service.   RESIDENT:  Kerby Nora, MD   ATTENDING PHYSICIAN:  Asencion Partridge, M.D.   DISCHARGE DIAGNOSES:  1.  Diverticulitis.  2.  Mesenteric ischemia.  3.  Hypertension.  4.  Hypothyroidism.  5.  Acute renal failure.  6.  Chronic renal insufficiency.  7.  Aortic stenosis.  8.  Thoracic aortic aneurysm.  9.  Hyperlipidemia.  10. Hemoccult-positive stool.  11. Anemia of chronic disease.  12. Bladder stone.  13. Anxiety.  14. Depression.  15. Tobacco abuse.   DISCHARGE MEDICATIONS:  1.  Synthroid 88 mcg 1 p.o. daily.  2.  Aspirin 81 mg 1 p.o. daily.  3.  Vytorin 10/80 one p.o. daily.  4.  Caltrate 600 mg 1 p.o. daily.  5.  Xanax 0.25 one p.o. q.h.s. p.r.n. sleep anxiety.  6.  Lexapro 10 mg 1 p.o. daily.  7.  Lotensin 5/12.5 mg 1 p.o. daily.  8.  Nicoderm CQ patch 14 mg apply one patch daily.  9.  Augmentin 875/125 mg 1 p.o. b.i.d. x4 days.   CONSULTS:  1.  Physical therapy -- no services needed.  2.  Occupational therapy -- no services needed.   HISTORY OF PRESENT ILLNESS:  See dictated H&P for details, but briefly, Ms.  [name] is a 26 history of female with a history of peripheral vascular  disease, hyperlipidemia, chronic renal insufficiency, hypertension, anxiety,  depression, and tobacco abuse, who presented with three weeks of worsening  diffuse abdominal pain and constipation.  The patient reported subjected  fever and chills and a 16-pound weight loss over 4 weeks.  The patient  reported some nausea and 1 episode of vomiting on the day of admission.  She  denied any  confusion, or blood in her stool, but does report  lightheadedness.  At the emergency department her pain was a pain of a 7/10.  A CT scan without contrast showed a diffuse colonic distention consistent  with a functional ileus.  On rectal exam she was found to have a large  amount of stool in the rectal vault.  A rectal tube was placed and an enema  was given in the emergency department without relief.   Her labs on admission include a white blood count of 15.8, an H&H of 15.7/47  and platelets 155.  Sodium 140, potassium 3.6, chloride 105, bicarb 22, BUN  21, creatinine 1.5, calcium 10.1.  Total protein was 6.7, albumin was 3.9,  AST 23, ALT 14, alkaline phosphatase 61, total bilirubin was 0.9, lipase was  47.  She was found to have heme-positive stool and on microscopic urine 3-6  red blood cells.   HOSPITAL COURSE BY PROBLEMS:  Problem #1:  ABDOMINAL PAIN.  The differential  diagnosis for [name]'s abdominal pain included:  Infectious causes such  as appendicitis or diverticulitis, although the patient has no fever on  admission and CT scan was not consistent with an abscess or diverticula,  although the CT scan was not performed with contrast.  The patient did have  an elevated white blood count.  Pancreatitis was not likely given her normal  lipase.  Malignancy was also on the differential given her recent weight  loss and heme-positive stool.  Mesenteric ischemia was also high on the  differential due to both her abdominal pain, ileus, and air seen in the  colon wall on CT scan.  Hypothyroidism was another potential cause of ileus  and we checked a TSH which was within normal limits.  Medications were an  unlikely cause as she had not been on any course of narcotics or medications  that would cause ileus. Viral gastroenteritis or C. difficile were unlikely  as she did not complain of diarrhea.  We admitted the patient and kept her  NPO and controlled her pain with morphine 1 mg  IV q.2 h. p.r.n. and  Phenergan 12.5 mg IV q.4-6 h. p.r.n. We started Dulcolax to relieve her  constipation and to rule out a cardiac cause of the abdominal pain we  checked an EKG which was normal.  Overnight on the first night of  hospitalization the patient spiked a fever to 102.4 making diverticulitis  high on our differential.  Because of the diffuse nature of the abdominal  pain and the patient's history of peripheral vascular disease it is likely  that mesenteric ischemia also played a role in the patient's abdominal pain  and ileus.  We started the patient on IV Zosyn for diverticulitis. We  followed serial KUBs each morning to evaluate her colonic distention and  checked an LDH with the result of 153 and an amylase which was within normal  limits at 57 to follow in case KUBs did not show increasing distention to  rule out perforation.  Dulcolax p.o. did not relieve Ms. Lamour's  constipation so we gave p.r.n. Dulcolax suppositories which resulted in  numerous bowel movements and relief of the constipation.  KUBs showed  decreasing colonic distention over the course of Ms. Pizzo's  hospitalization.  On hospital day #4 we began a clear liquid diet which she  tolerate.  On day #5 we advanced her diet to a regular diet and discontinued  her IV Zosyn and changed her antibiotic to Augmentin 875/125 mg one p.o.  b.i.d. x4 days for a total course of 10 days.  Ms. Medine abdominal pain  improved significantly after a Dulcolax suppository and her antibiotics and  we changed her pain medicines from narcotics to Tylenol 650 mg q.4-6 h.  p.r.n. for pain and her pain was well tolerated on this regimen.  The  patient remained afebrile after her course of antibiotics was started.  On  hospital day #2 her white blood count was noted to be elevated at 20.4 and  this trended down over the remaining hospital course to a discharge value of 8.9.  The patient was discharged with instructions to finish  her course of  antibiotics and to followup for her diverticulitis and mesenteric ischemia  with Dr. Kerby Nora at the [facility] on May 28, 2005.   Problem #2:  ACUTE RENAL FAILURE.  The patient has a history of chronic  renal insufficiency with a GFR of about 45% due to longstanding peripheral  vascular disease.  Her baseline creatinine is 1.2.  On admission her  creatinine had risen to 1.5.  We obtained a urine creatinine and urine  sodium and __________ calculated and it was less than 1 and consistent with  prerenal etiology.  We hydrated the patient aggressively with several fluid  boluses and ran her fluids at 200 mL/h.  After several days her creatinine  dropped back to its baseline at 1.2 and discontinued the fluids when she had  adequate p.o. intake.  Her creatinine remained stable until discharge at a  value of 1.2.   Problem #3:  HYPOTENSION.  On hospital day #2 the patient became hypotensive  with several blood pressures in the 70s/40s.  The patient was asymptomatic  with these blood pressures, but considering her acute renal failure we were  concerned with her having decreased perfusion to her kidneys with these low  blood pressures.  We aggressively hydrated the patient as described above  and her blood pressures increased back to her baseline in the 120s-140s/50s-  60s.  We discharged the patient on a lower dose of her home blood pressure  medicine, Lotensin.  We decreased it from 10/12.5 to 5/12.5 daily.   Problem #4:  AORTIC STENOSIS.  On admission the patient was noted to have a  3/6 systolic ejection murmur.  On 2-D echo the patient was found to have  moderate aortic stenosis with an ejection fraction of 55-65%.  Over the  course of the hospitalization.  The patient remained asymptomatic with no  increased lightheadedness or shortness of breath with exertion.  The patient  is to followup with her cardiologist Dr. Gala Romney at [facility].   Problem #5:  HYPERLIPIDEMIA.  The patient remained on Vytorin 10/80 p.o.  daily.   Problem #6:  HEME-POSITIVE STOOL.  On admission exam the patient was found  to have heme-positive stools.  The patient had a colonoscopy two years ago  with several benign polyps removed. The patient is advised to have a repeat  colonoscopy on discharge as an outpatient to rule out malignancy.  The  patient has an appointment with Dr. Teena Irani. Arlyce Dice with [facility] for  May 18, 2005.   Problem #7:  ANXIETY/DEPRESSION.  The patient has recently lost her husband,  a month ago, and had been neglecting her own health care.  Over the course  of hospitalization the patient became much brighter and more motivated to  care for herself.  She remained on her Xanax 0.25 mg p.o. q.h.s. for anxiety  and was started on Lexapro 10 mg daily. On discharge.   Problem #8:  TOBACCO ABUSE.  The patient expressed interest in smoking cessation and was discharged with Nicoderm 14 mg patch.   DISCHARGE CONDITION:  Good.   DISCHARGE INSTRUCTIONS:  The patient is to follow a heart healthy diet and  complete her course of antibiotics for diverticulitis and mesenteric  ischemia.   FOLLOWUP:  The patient has a followup appointment with Dr. Ermalene Searing at the  [facility] on November 17 at 10 a.m. The patient has  a followup appointment with Dr. Arlyce Dice at [facility] for a colonoscopy on  November 7 at 1 p.m.   Mitchel Honour dictated for Kerby Nora, MD      Kerby Nora, MD    ______________________________  Asencion Partridge, M.D.    AB/MEDQ  D:  05/12/2005  T:  05/13/2005  Job:  [id number]   cc:   [facility] FAX -- Dr. Vanita Ingles [phone]   [facility] FAX --  Dr. Autumn Patty  [phone]   [facility] FAX -- Dr. Asher Muir [phone]

## 2010-12-08 ENCOUNTER — Telehealth: Payer: Self-pay

## 2010-12-08 DIAGNOSIS — I6529 Occlusion and stenosis of unspecified carotid artery: Secondary | ICD-10-CM

## 2010-12-08 NOTE — Telephone Encounter (Signed)
Patient needs a carotid doppler in 6 months per Dr. Gala Romney.

## 2010-12-15 DIAGNOSIS — F320 Major depressive disorder, single episode, mild: Secondary | ICD-10-CM | POA: Insufficient documentation

## 2010-12-15 DIAGNOSIS — R5383 Other fatigue: Secondary | ICD-10-CM | POA: Insufficient documentation

## 2010-12-15 NOTE — Assessment & Plan Note (Signed)
No true shortness of breath, just feel pressure of centrl obesity and difficulty drwing in deep breath. Deconditioned. Encouraged exercise, weight loss as tolerated.

## 2010-12-15 NOTE — Assessment & Plan Note (Signed)
Likely multifactorial. Start b12 supplement. Regular exercise, consider silver sneakers program.

## 2010-12-15 NOTE — Assessment & Plan Note (Signed)
Well controlled. Continue current medication.  

## 2010-12-15 NOTE — Assessment & Plan Note (Signed)
Likely contributing to fatigue. Pt not interested in med to treat. Work on staying active, regular exercise and 8 hours of sleep at night.

## 2010-12-24 ENCOUNTER — Encounter: Payer: Self-pay | Admitting: Internal Medicine

## 2010-12-30 ENCOUNTER — Ambulatory Visit (INDEPENDENT_AMBULATORY_CARE_PROVIDER_SITE_OTHER): Payer: Medicare Other | Admitting: Internal Medicine

## 2010-12-30 ENCOUNTER — Encounter: Payer: Self-pay | Admitting: Internal Medicine

## 2010-12-30 VITALS — BP 119/73 | HR 64 | Ht 67.0 in | Wt 173.0 lb

## 2010-12-30 DIAGNOSIS — K439 Ventral hernia without obstruction or gangrene: Secondary | ICD-10-CM

## 2010-12-30 DIAGNOSIS — I359 Nonrheumatic aortic valve disorder, unspecified: Secondary | ICD-10-CM

## 2010-12-30 DIAGNOSIS — I714 Abdominal aortic aneurysm, without rupture: Secondary | ICD-10-CM

## 2010-12-30 DIAGNOSIS — I2510 Atherosclerotic heart disease of native coronary artery without angina pectoris: Secondary | ICD-10-CM

## 2010-12-30 DIAGNOSIS — I6529 Occlusion and stenosis of unspecified carotid artery: Secondary | ICD-10-CM

## 2010-12-30 NOTE — Assessment & Plan Note (Signed)
Doing well. Will repeat u/s in 6 months.

## 2010-12-30 NOTE — Progress Notes (Signed)
HPI:  [name] is a delightful 77 year old woman with history of coronary artery disease status post non-ST-elevation myocardial iinfarction in January 2007, treated with bare-metal stenting to the obtuse marginal.  She has a totally occluded right coronary with distal collateralization ad signifcant LCX disease which is being treated medically as it is not easily amenable to intervention. She has normal LV function.  Remainder of PMHx notable for: aortic stenosis, peripheral vascular disease with previous abdominal aortic aneurysm repair and bilateral iliac stenting, carotid artery stenosis, renal artery stenosis with an atrophic left kidney and a moderate blockage on the right, hypertension and hyperlipidemia.  She returns today for routine. Now she feels good. Only complaint is large ventral hernia - feels bloated and cuts off her air. No CP. Looking after great grandkids Continues with chronic DOE. Not exercising regularly any more. No orthopnea, PND or edema. No neuro sx.  Following with Dr. Arrie Aran and Dr. Azzie Roup. Was anemic and got 1 u PRBCs. Dr. Dareen Piano treating gout.   1) Carotids 12-09-2022: R 60-79% L 0-39% (stable)  2) Echo 10/11: EF 55-65% Mild AS mean gradient 18 mil-mod AI 3) AAA stable 4.4. cm on CT in 02/2009  ROS: All systems negative except as listed in HPI, PMH and Problem List.  Past Medical History  Diagnosis Date  . Aortic stenosis     Moderate; Echo 12/09. moderate AS mean 19. AVA 0.88  . PAD (peripheral artery disease)     s/p iliac stents  . HTN (hypertension)   . Hyperparathyroidism   . Hypothyroidism   . GERD (gastroesophageal reflux disease)   . Depression Dec 08, 2004    With death of spouse  . Ventral hernia     Moderate sized  . CAD (coronary artery disease)     s/p NSTEMI 1/07 with BMS to OM; cath 12/09 due to positive Myoview, LM ok. LAD 40%. LCX 80-90% mid. RCA chronically occluded with R --> L collats  . AAA (abdominal aortic aneurysm)    Recurrent; s/p repair, now 3.8 cam; h/o ischemic bowel 12/08/04  . Carotid artery stenosis     R 60-79% L 0-39% (6/10) - stable  . Hyperlipidemia     Increased LFTs on Vytorin. Changed to Lipitor.  Marland Kitchen CRI (chronic renal insufficiency)     Mild, Cr 1.3  . Renal artery stenosis     Atrophic L kidney. Moderate blockage on R  . Basal cell carcinoma of nose 1980s    Current Outpatient Prescriptions  Medication Sig Dispense Refill  . aluminum-magnesium hydroxide-simethicone (MAALOX) 200-200-20 MG/5ML SUSP Take 30 mLs by mouth as needed.        Marland Kitchen aspirin 81 MG tablet Take 81 mg by mouth daily.        Marland Kitchen atorvastatin (LIPITOR) 80 MG tablet Take 80 mg by mouth daily.        . Calcium Carbonate (CALTRATE 600) 1500 MG TABS Take 1 tablet by mouth daily.        . clopidogrel (PLAVIX) 75 MG tablet Take 75 mg by mouth daily.        . febuxostat (ULORIC) 40 MG tablet Take 80 mg by mouth daily.        . isosorbide mononitrate (IMDUR) 30 MG 24 hr tablet Take 30 mg by mouth daily.        Marland Kitchen levothyroxine (SYNTHROID, LEVOTHROID) 112 MCG tablet Take 112 mcg by mouth daily.        Marland Kitchen lisinopril (PRINIVIL,ZESTRIL) 10 MG tablet       .  metoprolol tartrate (LOPRESSOR) 25 MG tablet Take 12.5 mg by mouth 2 (two) times daily.        . predniSONE (DELTASONE) 5 MG tablet Take 5 mg by mouth. Prn for gout       . Sodium Polystyrene Sulfonate (KALEXATE PO) Take 15 g by mouth daily.        . colchicine 0.6 MG tablet Take 0.6 mg by mouth daily. For 30 days       . esomeprazole (NEXIUM) 40 MG capsule Take 40 mg by mouth daily before breakfast.           PHYSICAL EXAM: Filed Vitals:   12/30/10 1407  BP: 119/73  Pulse: 64   General: elderly.  no resp difficulty HEENT: normal Neck: supple. JVP flat Carotids 2+ bilat + radiated bruits. No lymphadenopathy or thryomegaly appreciated. Cor: PMI nondisplaced. Regular rate & rhythm. No rubs, gallops,3/6 AS murmur S2  mildly to moderately diminished. i do not hear AI Lungs:  clear Abdomen: soft, nontender, nondistended. small ventral hernia Extremities: no cyanosis, clubbing, rash, edema. gouty nodule L 1st toe Neuro: alert & orientedx3, cranial nerves grossly intact. moves all 4 extremities w/o difficulty. affect pleasant    ECG: Sinus brady 59 No ST-T wave abnormalities.     ASSESSMENT & PLAN:

## 2010-12-30 NOTE — Assessment & Plan Note (Signed)
No evidence of ischemia. Continue current regimen.   

## 2010-12-30 NOTE — Patient Instructions (Signed)
IN 6 MONTHS: Your physician has requested that you have an echocardiogram. Echocardiography is a painless test that uses sound waves to create images of your heart. It provides your doctor with information about the size and shape of your heart and how well your heart's chambers and valves are working. This procedure takes approximately one hour. There are no restrictions for this procedure.  IN 6 MONTHS: Your physician has requested that you have an abdominal aorta duplex. During this test, an ultrasound is used to evaluate the aorta. Allow 30 minutes for this exam. Do not eat after midnight the day before and avoid carbonated beverages  Your physician recommends that you schedule a follow-up appointment in: 6 MONTHS

## 2010-12-30 NOTE — Assessment & Plan Note (Signed)
Stable. Asymptomatic. Due for repeat u/s in 6 months.

## 2010-12-30 NOTE — Assessment & Plan Note (Signed)
Long talk about risks of surgery. Although she would be at moderate to high risk for peri-op complications, I do not think risk is prohibitive and if she is really miserable have encouraged to re-discuss her options with Dr. Dwain Sarna.

## 2010-12-30 NOTE — Assessment & Plan Note (Signed)
Due for repeat u/s in 6 months.

## 2010-12-31 ENCOUNTER — Other Ambulatory Visit: Payer: Self-pay | Admitting: Family Medicine

## 2011-01-08 ENCOUNTER — Encounter: Payer: Self-pay | Admitting: Family Medicine

## 2011-01-08 ENCOUNTER — Ambulatory Visit (INDEPENDENT_AMBULATORY_CARE_PROVIDER_SITE_OTHER): Payer: Medicare Other | Admitting: Family Medicine

## 2011-01-08 DIAGNOSIS — F329 Major depressive disorder, single episode, unspecified: Secondary | ICD-10-CM

## 2011-01-08 DIAGNOSIS — E785 Hyperlipidemia, unspecified: Secondary | ICD-10-CM

## 2011-01-08 DIAGNOSIS — R5381 Other malaise: Secondary | ICD-10-CM

## 2011-01-08 DIAGNOSIS — E039 Hypothyroidism, unspecified: Secondary | ICD-10-CM

## 2011-01-08 DIAGNOSIS — I10 Essential (primary) hypertension: Secondary | ICD-10-CM

## 2011-01-08 DIAGNOSIS — R5383 Other fatigue: Secondary | ICD-10-CM

## 2011-01-08 DIAGNOSIS — F320 Major depressive disorder, single episode, mild: Secondary | ICD-10-CM

## 2011-01-08 MED ORDER — LEVOTHYROXINE SODIUM 112 MCG PO TABS: 112.0000 ug | ORAL_TABLET | Freq: Every day | ORAL | Status: DC

## 2011-01-08 NOTE — Progress Notes (Signed)
  Subjective:    Patient ID: [name], female    DOB: [birth year], 77 y.o.   MRN: [id number]  HPI 77 year old female here for 6 month follow up.  She feels better overall. She has been getting out more. Has changed her outlook on life.  She is less fatigued.  Hypothyroid  Lab Results  Component Value Date   TSH 0.31* 10/21/2010     Review of Systems  Constitutional: Negative for fever and fatigue.  HENT: Negative for ear pain.   Eyes: Negative for pain.  Respiratory: Negative for chest tightness and shortness of breath.   Cardiovascular: Negative for chest pain, palpitations and leg swelling.  Gastrointestinal: Negative for abdominal pain.  Genitourinary: Negative for dysuria.       Objective:   Physical Exam  Constitutional: Vital signs are normal. She appears well-developed and well-nourished. She is cooperative.  Non-toxic appearance. She does not appear ill. No distress.  HENT:  Head: Normocephalic.  Right Ear: Hearing, tympanic membrane, external ear and ear canal normal. Tympanic membrane is not erythematous, not retracted and not bulging.  Left Ear: Hearing, tympanic membrane, external ear and ear canal normal. Tympanic membrane is not erythematous, not retracted and not bulging.  Nose: No mucosal edema or rhinorrhea. Right sinus exhibits no maxillary sinus tenderness and no frontal sinus tenderness. Left sinus exhibits no maxillary sinus tenderness and no frontal sinus tenderness.  Mouth/Throat: Uvula is midline, oropharynx is clear and moist and mucous membranes are normal.  Eyes: Conjunctivae, EOM and lids are normal. Pupils are equal, round, and reactive to light. No foreign bodies found.  Neck: Trachea normal and normal range of motion. Neck supple. Carotid bruit is not present. No mass and no thyromegaly present.  Cardiovascular: Normal rate, regular rhythm, S1 normal, S2 normal, normal heart sounds, intact distal pulses and normal pulses.  Exam reveals no  gallop and no friction rub.   No murmur heard. Pulmonary/Chest: Effort normal and breath sounds normal. Not tachypneic. No respiratory distress. She has no decreased breath sounds. She has no wheezes. She has no rhonchi. She has no rales.  Abdominal: Soft. Normal appearance and bowel sounds are normal. There is no tenderness.  Neurological: She is alert.  Skin: Skin is warm, dry and intact. No rash noted.  Psychiatric: Her speech is normal and behavior is normal. Judgment and thought content normal. Her mood appears not anxious. Cognition and memory are normal. She does not exhibit a depressed mood.          Assessment & Plan:

## 2011-01-08 NOTE — Assessment & Plan Note (Signed)
Improved

## 2011-01-08 NOTE — Assessment & Plan Note (Signed)
Well controlled. Continue current medication.  

## 2011-01-08 NOTE — Assessment & Plan Note (Signed)
I hesitate to lower dose of thyroid medicaiton because on lower dose it was very poorly controlled and shae is only minimally overtreated and feeling better on this dose.

## 2011-01-28 ENCOUNTER — Ambulatory Visit (INDEPENDENT_AMBULATORY_CARE_PROVIDER_SITE_OTHER): Payer: Self-pay | Admitting: General Surgery

## 2011-03-02 ENCOUNTER — Ambulatory Visit (INDEPENDENT_AMBULATORY_CARE_PROVIDER_SITE_OTHER): Payer: Medicare Other | Admitting: General Surgery

## 2011-03-02 ENCOUNTER — Encounter (INDEPENDENT_AMBULATORY_CARE_PROVIDER_SITE_OTHER): Payer: Self-pay | Admitting: General Surgery

## 2011-03-02 VITALS — BP 146/78 | HR 58 | Temp 97.0°F | Ht 67.0 in | Wt 178.1 lb

## 2011-03-02 DIAGNOSIS — K439 Ventral hernia without obstruction or gangrene: Secondary | ICD-10-CM

## 2011-03-02 NOTE — Progress Notes (Signed)
Subjective:     Patient ID: [name], female   DOB: Oct 11, 1927, 77 y.o.   MRN: [id number]  HPI This is an 77 year old female who I initially saw in 2009. She had a ventral hernia at that time. On a CT scan she had an anterior ventral hernia measuring approximately 3 cm in diameter containing mesenteric fat with a second smaller one containing fat as well. At that point in time these were fairly asymptomatic and she has a variety of other medical problems I think were per his to repairing this. She has a history of an open cholecystectomy as well as a AAA repair and incisional hernia repair in for a portion of her prior incision. She is referred back this time to reevaluate her for this hernia. Since I last saw her she has some increased shortness of breath with activity that I don't think is  related to her hernia. She feels like the hernia has mildly increased in size. She feels bloated whenever she eats and eats frequent, small meals. This hernia never hurts and it always goes away when she lies down. Gen. nausea or vomiting or H. room with her bowel movements. She wanted to come in just to see if she should have his operative wound now or if it would be reasonable just to follow this.  Review of Systems     Objective:   Physical Exam  Constitutional: She appears well-developed and well-nourished.  Abdominal: Soft. Bowel sounds are normal. She exhibits no distension and no mass. There is no tenderness. A hernia is present. Hernia confirmed positive in the ventral area.         Assessment:     Reducible incisional ventral hernia    Plan:     The patient and her daughter and I discussed her ventral hernia again today. Her functional status is fair. I've seen Dr. Prescott Smith note and surgery is not prohibitive. I'm still however very concerned with her comorbidities. I'm concerned specifically about taking her to the operating room and certainly change in her quality of life as well as  the possibility of a more serious cardiac complication. Her hernia is fairly stable on my examination and really does not have any symptoms. I told her the standard treatment would be to repair her hernia in the operating room. I think however for her the best plan would be to just continue watching this for now. We discussed there is a possibility of needing an emergency operation and what the morbidity and mortality of that would be. We also discussed an elective repair and with the morbidity and mortality would be of that. I discussed with him a laparoscopic repair with the risks associated with that. After a long discussion all of [place] are in agreement that we should just continue following this for now. I asked her she starts having symptoms or begins having any other problems with this to please call me sooner I think for now we'll just follow this.

## 2011-03-05 LAB — CBC AND DIFFERENTIAL
Hemoglobin: 13.7 g/dL (ref 12.0–16.0)
Neutrophils Absolute: 5 /uL
Platelets: 169 10*3/uL (ref 150–399)
WBC: 7.6 10^3/mL

## 2011-03-05 LAB — BASIC METABOLIC PANEL
BUN: 26 mg/dL — AB (ref 4–21)
Creatinine: 1.3 mg/dL — AB (ref 0.5–1.1)
Creatinine: 1.3 mg/dL — AB (ref 0.5–1.1)

## 2011-03-05 LAB — HEPATIC FUNCTION PANEL
Alkaline Phosphatase: 82 U/L (ref 25–125)
Bilirubin, Total: 0.5 mg/dL

## 2011-03-07 ENCOUNTER — Other Ambulatory Visit: Payer: Self-pay | Admitting: Cardiology

## 2011-04-12 ENCOUNTER — Encounter: Payer: Self-pay | Admitting: Internal Medicine

## 2011-04-16 LAB — CBC
HCT: 38 % (ref 36.0–46.0)
Hemoglobin: 12.7 g/dL (ref 12.0–15.0)
MCHC: 33.3 g/dL (ref 30.0–36.0)
MCV: 90.1 fL (ref 78.0–100.0)
RBC: 4.23 MIL/uL (ref 3.87–5.11)
RDW: 15.4 % (ref 11.5–15.5)

## 2011-04-16 LAB — DIFFERENTIAL
Basophils Absolute: 0 10*3/uL (ref 0.0–0.1)
Basophils Relative: 1 % (ref 0–1)
Eosinophils Absolute: 0.1 10*3/uL (ref 0.0–0.7)
Eosinophils Relative: 2 % (ref 0–5)
Monocytes Absolute: 0.4 10*3/uL (ref 0.1–1.0)
Monocytes Relative: 7 % (ref 3–12)

## 2011-04-16 LAB — BASIC METABOLIC PANEL
CO2: 25 mEq/L (ref 19–32)
Chloride: 110 mEq/L (ref 96–112)
Creatinine, Ser: 1.33 mg/dL — ABNORMAL HIGH (ref 0.4–1.2)
GFR calc Af Amer: 47 mL/min — ABNORMAL LOW (ref >60)

## 2011-04-20 ENCOUNTER — Ambulatory Visit (INDEPENDENT_AMBULATORY_CARE_PROVIDER_SITE_OTHER): Payer: Medicare Other | Admitting: Family Medicine

## 2011-04-20 ENCOUNTER — Encounter: Payer: Self-pay | Admitting: Family Medicine

## 2011-04-20 VITALS — BP 134/80 | HR 60 | Temp 98.4°F | Wt 167.5 lb

## 2011-04-20 DIAGNOSIS — J209 Acute bronchitis, unspecified: Secondary | ICD-10-CM

## 2011-04-20 MED ORDER — BENZONATATE 100 MG PO CAPS: 100.0000 mg | ORAL_CAPSULE | Freq: Three times a day (TID) | ORAL | Status: AC | PRN

## 2011-04-20 MED ORDER — AZITHROMYCIN 250 MG PO TABS: ORAL_TABLET | ORAL | Status: AC

## 2011-04-20 MED ORDER — BENZONATATE 100 MG PO CAPS: 100.0000 mg | ORAL_CAPSULE | Freq: Three times a day (TID) | ORAL | Status: DC | PRN

## 2011-04-20 NOTE — Progress Notes (Signed)
  Subjective:    Patient ID: [name], female    DOB: 01/28/1928, 77 y.o.   MRN: [id number]  HPI  [name], a 77 y.o. female presents today in the office for the following:    Multiple med probs, AS, CAD, 1 kidney, CKD, h/o tobacco abuse. Bad coughing. Started out in her sinuses. Throat has been sore. Coughing a lot. Started about Friday - 5 days. 07/1995 quit smoking - smoked for a long time.   Only 1 kidney.   AF, but generally feels bad. Cough is the worst - nothing helping  The PMH, PSH, Social History, Family History, Medications, and allergies have been reviewed in [facility], and have been updated if relevant.   Review of Systems ROS: GEN: Acute illness details above GI: Tolerating PO intake GU: maintaining adequate hydration and urination Pulm: No SOB Interactive and getting along well at home.  Otherwise, ROS is as per the HPI.     Objective:   Physical Exam   Physical Exam  Blood pressure 134/80, pulse 60, temperature 98.4 F (36.9 C), temperature source Oral, weight 167 lb 8 oz (75.978 kg), SpO2 99.00%.  GEN: A and O x 3. WDWN. NAD.    ENT: Nose clear, ext NML.  No LAD.  No JVD.  TM's clear. Oropharynx clear.  PULM: Normal WOB, no distress. No crackles, wheezes, rhonchi. CV: RRR, no M/G/R, No rubs, No JVD.   ABD: S, NT, ND, + BS. No rebound. No guarding. No HSM.   EXT: warm and well-perfused, No c/c/e. PSYCH: Pleasant and conversant.      Assessment & Plan:   1. Bronchitis, acute  azithromycin (ZITHROMAX Z-PAK) 250 MG tablet, benzonatate (TESSALON) 100 MG capsule, DISCONTINUED: benzonatate (TESSALON) 100 MG capsule    A/P: Acute bronchitis: discussed plan of care. Given length of symptoms and overall history, will treat with ABX in this case. Continue with additional supportive care, cough medications, liquids, sleep, steam / vaporizer.

## 2011-04-20 NOTE — Patient Instructions (Signed)
BRONCHITIS -Viral or baterial infections of the lung. Fever, cough, chest pain, shortness of breath, phlegm production, fatigue are symptoms.  Treatment: 1. Take all medicines 2. Antibiotics  3. Cough suppressant (TESSALON TABLETS)  Fluids and Moisture help: drink lots of fluids Vaporizier or humidifier in room, shower steam --help loosen secretions and sooth breathing passages  Elevate head slightly when trying to sleep.

## 2011-05-14 ENCOUNTER — Other Ambulatory Visit: Payer: Self-pay | Admitting: Internal Medicine

## 2011-05-20 ENCOUNTER — Encounter: Payer: Self-pay | Admitting: Family Medicine

## 2011-06-08 ENCOUNTER — Encounter: Payer: Medicare Other | Admitting: *Deleted

## 2011-06-10 ENCOUNTER — Telehealth: Payer: Self-pay | Admitting: Family Medicine

## 2011-06-10 DIAGNOSIS — N259 Disorder resulting from impaired renal tubular function, unspecified: Secondary | ICD-10-CM

## 2011-06-10 DIAGNOSIS — E785 Hyperlipidemia, unspecified: Secondary | ICD-10-CM

## 2011-06-10 DIAGNOSIS — E039 Hypothyroidism, unspecified: Secondary | ICD-10-CM

## 2011-06-10 NOTE — Telephone Encounter (Signed)
Message copied by Excell Seltzer on Thu Jun 10, 2011  5:32 PM ------      Message from: Alvina Chou      Created: Mon Jun 07, 2011 12:30 PM      Regarding: labs for Friday 11-30       Patient is scheduled for CPX labs, please order future labs, Thanks , Camelia Eng

## 2011-06-11 ENCOUNTER — Other Ambulatory Visit (INDEPENDENT_AMBULATORY_CARE_PROVIDER_SITE_OTHER): Payer: Medicare Other

## 2011-06-11 DIAGNOSIS — N259 Disorder resulting from impaired renal tubular function, unspecified: Secondary | ICD-10-CM

## 2011-06-11 DIAGNOSIS — E785 Hyperlipidemia, unspecified: Secondary | ICD-10-CM

## 2011-06-11 DIAGNOSIS — E039 Hypothyroidism, unspecified: Secondary | ICD-10-CM

## 2011-06-11 LAB — LDL CHOLESTEROL, DIRECT: Direct LDL: 134.5 mg/dL

## 2011-06-11 LAB — COMPREHENSIVE METABOLIC PANEL
ALT: 13 U/L (ref 0–35)
Albumin: 3.7 g/dL (ref 3.5–5.2)
Alkaline Phosphatase: 66 U/L (ref 39–117)
CO2: 26 mEq/L (ref 19–32)
GFR: 43.25 mL/min — ABNORMAL LOW (ref >60.00)
Glucose, Bld: 91 mg/dL (ref 70–99)
Potassium: 4.7 mEq/L (ref 3.5–5.1)
Sodium: 139 mEq/L (ref 135–145)
Total Bilirubin: 0.5 mg/dL (ref 0.3–1.2)
Total Protein: 6.8 g/dL (ref 6.0–8.3)

## 2011-06-11 LAB — LIPID PANEL
HDL: 48.9 mg/dL (ref >39.00)
VLDL: 50.8 mg/dL — ABNORMAL HIGH (ref 0.0–40.0)

## 2011-06-11 LAB — TSH: TSH: 0.38 u[IU]/mL (ref 0.35–5.50)

## 2011-06-17 ENCOUNTER — Encounter: Payer: Self-pay | Admitting: Cardiovascular Disease

## 2011-06-18 ENCOUNTER — Encounter: Payer: Self-pay | Admitting: Family Medicine

## 2011-06-18 ENCOUNTER — Ambulatory Visit (INDEPENDENT_AMBULATORY_CARE_PROVIDER_SITE_OTHER): Payer: Medicare Other | Admitting: Family Medicine

## 2011-06-18 VITALS — BP 110/70 | HR 49 | Temp 97.7°F | Ht 67.0 in | Wt 168.8 lb

## 2011-06-18 DIAGNOSIS — Z0000 Encounter for general adult medical examination without abnormal findings: Secondary | ICD-10-CM

## 2011-06-18 DIAGNOSIS — I10 Essential (primary) hypertension: Secondary | ICD-10-CM

## 2011-06-18 DIAGNOSIS — Z23 Encounter for immunization: Secondary | ICD-10-CM

## 2011-06-18 DIAGNOSIS — E039 Hypothyroidism, unspecified: Secondary | ICD-10-CM

## 2011-06-18 DIAGNOSIS — E785 Hyperlipidemia, unspecified: Secondary | ICD-10-CM

## 2011-06-18 NOTE — Assessment & Plan Note (Signed)
INadequate control.. Worsened since last check despite fish oila ns statin. Will work to get back on track given CAD, and stenosis risk factors.

## 2011-06-18 NOTE — Progress Notes (Signed)
Subjective:    Patient ID: [name], female    DOB: 01-16-1928, 77 y.o.   MRN: [id number]  HPI I have personally reviewed the Medicare Annual Wellness questionnaire and have noted 1. The patient's medical and social history 2. Their use of alcohol, tobacco or illicit drugs 3. Their current medications and supplements 4. The patient's functional ability including ADL's, fall risks, home safety risks and hearing or visual             impairment. 5. Diet and physical activities 6. Evidence for depression or mood disorders The patients weight, height, BMI and visual acuity have been recorded in the chart I have made referrals, counseling and provided education to the patient based review of the above and I have provided the pt with a written personalized care plan for preventive services.  Since last OV.Marland Kitchen She has sold her house.. Moved into townhouse.Iris Pert sister. More active. She is much happier.  CRF: followed by Dr. Tyrone Schimke, appt coming up.   Hypertension:  Well controlled on current meds.  Using medication without problems or lightheadedness: None Chest pain with exertion:None Edema:None Short of breath:stable Average home BPs:occ Other issues:  Exercise: walking daily.  Diet: poor recently due to move.  Elevated Cholesterol: Not sticking to diet well... occ missing lipitor daily.. Forgets given more busy. Using medications without problems:None Muscle aches: None No weight gain.   CAD: Will now be followed by Dr. Mariah Milling.  Has ECHO, carotid dopplers and aortic eval next week.      Review of Systems  Constitutional: Negative for fever, fatigue and unexpected weight change.  HENT: Negative for ear pain, congestion, sore throat, sneezing, trouble swallowing and sinus pressure.   Eyes: Negative for pain and itching.  Respiratory: Negative for cough, shortness of breath and wheezing.   Cardiovascular: Negative for chest pain, palpitations and leg swelling.    Gastrointestinal: Negative for nausea, abdominal pain, diarrhea, constipation and blood in stool.  Genitourinary: Negative for dysuria, hematuria, vaginal discharge, difficulty urinating and menstrual problem.  Skin: Negative for rash.  Neurological: Negative for syncope, weakness, light-headedness, numbness and headaches.  Psychiatric/Behavioral: Negative for confusion and dysphoric mood. The patient is not nervous/anxious.        Objective:   Physical Exam  Constitutional: Vital signs are normal. She appears well-developed and well-nourished. She is cooperative.  Non-toxic appearance. She does not appear ill. No distress.       Elderly female   HENT:  Head: Normocephalic.  Right Ear: Hearing, tympanic membrane, external ear and ear canal normal.  Left Ear: Hearing, tympanic membrane, external ear and ear canal normal.  Nose: Nose normal.  Eyes: Conjunctivae, EOM and lids are normal. Pupils are equal, round, and reactive to light. No foreign bodies found.  Neck: Trachea normal and normal range of motion. Neck supple. Carotid bruit is not present. No mass and no thyromegaly present.  Cardiovascular: Normal rate, regular rhythm, S1 normal, S2 normal and intact distal pulses.  Exam reveals no gallop.   Murmur heard.  Crescendo systolic murmur is present with a grade of 3/6       Greatest at RUSB  Pulmonary/Chest: Effort normal and breath sounds normal. No respiratory distress. She has no wheezes. She has no rhonchi. She has no rales.  Abdominal: Soft. Normal appearance and bowel sounds are normal. She exhibits no distension, no fluid wave, no abdominal bruit and no mass. There is no hepatosplenomegaly. There is no tenderness. There is no rebound, no guarding  and no CVA tenderness. No hernia.  Lymphadenopathy:    She has no cervical adenopathy.    She has no axillary adenopathy.  Neurological: She is alert. She has normal strength. No cranial nerve deficit or sensory deficit.  Skin: Skin  is warm, dry and intact. Rash noted.       Dry flaky patches on legs  Psychiatric: She has a normal mood and affect. Her speech is normal and behavior is normal. Judgment and thought content normal. Her mood appears not anxious. Cognition and memory are normal. She does not exhibit a depressed mood.          Assessment & Plan:  AMW: The patient's preventative maintenance and recommended screening tests for an annual wellness exam were reviewed in full today. Brought up to date unless services declined.  Counselled on the importance of diet, exercise, and its role in overall health and mortality. The patient's FH and SH was reviewed, including their home life, tobacco status, and drug and alcohol status.   Vaccines: flu given. Uptodate with PNA, shingles or  No indication for mammogram. No indication for DVE/pap smear. Colonoscopy: last in 2009.. No bowel issues.. Does not wish to repeat. DXA: last 2010.Marland Kitchen Showed osteopenia in hip and spine... Refuses treatement so does not want to eval.  Taking CA, on no vit D per nephrologist.

## 2011-06-18 NOTE — Assessment & Plan Note (Signed)
Well controlled. Continue current medication.  

## 2011-06-18 NOTE — Patient Instructions (Signed)
On track with healthy eating, and taking medication every day.

## 2011-06-21 ENCOUNTER — Other Ambulatory Visit: Payer: Self-pay | Admitting: Cardiology

## 2011-06-21 DIAGNOSIS — R011 Cardiac murmur, unspecified: Secondary | ICD-10-CM

## 2011-06-22 ENCOUNTER — Encounter (INDEPENDENT_AMBULATORY_CARE_PROVIDER_SITE_OTHER): Payer: Medicare Other | Admitting: *Deleted

## 2011-06-22 ENCOUNTER — Other Ambulatory Visit (INDEPENDENT_AMBULATORY_CARE_PROVIDER_SITE_OTHER): Payer: Medicare Other | Admitting: *Deleted

## 2011-06-22 DIAGNOSIS — R011 Cardiac murmur, unspecified: Secondary | ICD-10-CM

## 2011-06-22 DIAGNOSIS — I714 Abdominal aortic aneurysm, without rupture, unspecified: Secondary | ICD-10-CM

## 2011-06-22 DIAGNOSIS — I6529 Occlusion and stenosis of unspecified carotid artery: Secondary | ICD-10-CM

## 2011-06-22 DIAGNOSIS — I359 Nonrheumatic aortic valve disorder, unspecified: Secondary | ICD-10-CM

## 2011-06-22 DIAGNOSIS — I700 Atherosclerosis of aorta: Secondary | ICD-10-CM

## 2011-06-22 DIAGNOSIS — I723 Aneurysm of iliac artery: Secondary | ICD-10-CM

## 2011-06-29 ENCOUNTER — Ambulatory Visit (INDEPENDENT_AMBULATORY_CARE_PROVIDER_SITE_OTHER): Payer: Medicare Other | Admitting: Cardiovascular Disease

## 2011-06-29 ENCOUNTER — Encounter: Payer: Self-pay | Admitting: Cardiovascular Disease

## 2011-06-29 VITALS — BP 132/68 | HR 54 | Ht 66.0 in | Wt 168.0 lb

## 2011-06-29 DIAGNOSIS — I714 Abdominal aortic aneurysm, without rupture, unspecified: Secondary | ICD-10-CM

## 2011-06-29 DIAGNOSIS — E785 Hyperlipidemia, unspecified: Secondary | ICD-10-CM

## 2011-06-29 DIAGNOSIS — I359 Nonrheumatic aortic valve disorder, unspecified: Secondary | ICD-10-CM

## 2011-06-29 DIAGNOSIS — I10 Essential (primary) hypertension: Secondary | ICD-10-CM

## 2011-06-29 DIAGNOSIS — I6529 Occlusion and stenosis of unspecified carotid artery: Secondary | ICD-10-CM

## 2011-06-29 DIAGNOSIS — I2510 Atherosclerotic heart disease of native coronary artery without angina pectoris: Secondary | ICD-10-CM

## 2011-06-29 NOTE — Assessment & Plan Note (Signed)
Poorly controlled at this time. We have encouraged her to restart her Lipitor.

## 2011-06-29 NOTE — Assessment & Plan Note (Signed)
We will refer her to vascular surgery for carotid evaluation of the right. There has been progression over the past year, currently appears severe stenosis. She is asymptomatic. We have encouraged her to restart her statin.

## 2011-06-29 NOTE — Progress Notes (Signed)
Patient ID: [name], female    DOB: March 26, 1928, 77 y.o.   MRN: [id number]  HPI Comments: [name] is an 77 year old woman with history of coronary artery disease, status post non-ST-elevation myocardial infarction in January 2007, treated with bare-metal stenting to the obtuse marginal.  She has a totally occluded right coronary with distal collateralization ad signifcant LCX disease which is being treated medically as it is not easily amenable to intervention. She has normal LV function, Peripheral vascular disease  with previous abdominal aortic aneurysm repair and bilateral iliac stenting, carotid artery stenosis, renal artery stenosis with an atrophic left kidney and a moderate blockage on the right, hypertension and hyperlipidemia. Also with mild aortic stenosis.   She returns today for routine. Now she feels good.  Recent carotid ultrasound showed progression of the stenosis on the right. Estimate is 80-99%.  Aortic ultrasound showed tortuous ectatic aorta biiliac bypass graft. Maximum dimensions 3.5 x 3.3 cm, dilated left common iliac artery 2 cm Echo 10/11: EF 55-65% Mild AS mean gradient 18 mil-mod AI  She reports that she is not taking her Lipitor Most recent lipids showed total cholesterol are elevated  EKG shows sinus bradycardia with rate 54 beats per minute with no significant ST or T wave changes       Outpatient Encounter Prescriptions as of 06/29/2011  Medication Sig Dispense Refill  . aspirin 81 MG tablet Take 81 mg by mouth daily.        Marland Kitchen atorvastatin (LIPITOR) 80 MG tablet Take 80 mg by mouth daily.        . Calcium Carbonate (CALTRATE 600) 1500 MG TABS Take 1 tablet by mouth daily.        . clopidogrel (PLAVIX) 75 MG tablet TAKE 1 TABLET DAILY  90 tablet  2  . febuxostat (ULORIC) 40 MG tablet Take 80 mg by mouth daily.        . isosorbide mononitrate (IMDUR) 30 MG 24 hr tablet TAKE 2 TABLETS DAILY  180 tablet  1  . levothyroxine (SYNTHROID, LEVOTHROID)  112 MCG tablet Take 1 tablet (112 mcg total) by mouth daily.  90 tablet  3  . lisinopril (PRINIVIL,ZESTRIL) 10 MG tablet Take 10 mg by mouth daily.       . metoprolol tartrate (LOPRESSOR) 25 MG tablet TAKE ONE-HALF (1/2) TABLET TWICE A DAY  90 tablet  1  . DISCONTD: aluminum-magnesium hydroxide-simethicone (MAALOX) 200-200-20 MG/5ML SUSP Take 30 mLs by mouth as needed.          Review of Systems  Constitutional: Negative.   HENT: Negative.   Eyes: Negative.   Respiratory: Negative.   Cardiovascular: Negative.   Gastrointestinal: Negative.        Pain periodically from ventral hernia  Musculoskeletal: Negative.   Skin: Negative.   Neurological: Negative.   Hematological: Negative.   Psychiatric/Behavioral: Negative.   All other systems reviewed and are negative.    BP 132/68  Pulse 54  Ht 5\' 6"  (1.676 m)  Wt 168 lb (76.204 kg)  BMI 27.12 kg/m2  Physical Exam  Nursing note and vitals reviewed. Constitutional: She is oriented to person, place, and time. She appears well-developed and well-nourished.  HENT:  Head: Normocephalic.  Nose: Nose normal.  Mouth/Throat: Oropharynx is clear and moist.  Eyes: Conjunctivae are normal. Pupils are equal, round, and reactive to light.  Neck: Normal range of motion. Neck supple. No JVD present. Carotid bruit is present.  Cardiovascular: Normal rate, regular rhythm, S1 normal, S2  normal and intact distal pulses.  Exam reveals no gallop and no friction rub.   Murmur heard.  Systolic murmur is present with a grade of 2/6  Pulmonary/Chest: Effort normal and breath sounds normal. No respiratory distress. She has no wheezes. She has no rales. She exhibits no tenderness.  Abdominal: Soft. Bowel sounds are normal. She exhibits no distension. There is no tenderness.  Musculoskeletal: Normal range of motion. She exhibits no edema and no tenderness.  Lymphadenopathy:    She has no cervical adenopathy.  Neurological: She is alert and oriented to  person, place, and time. Coordination normal.  Skin: Skin is warm and dry. No rash noted. No erythema.  Psychiatric: She has a normal mood and affect. Her behavior is normal. Judgment and thought content normal.         Assessment and Plan

## 2011-06-29 NOTE — Assessment & Plan Note (Signed)
Mild aortic valve stenosis by echocardiogram last year.

## 2011-06-29 NOTE — Assessment & Plan Note (Signed)
Currently with no symptoms of angina. No further workup at this time. Continue current medication regimen. She will likely not need any preoperative testing prior to carotid surgery

## 2011-06-29 NOTE — Assessment & Plan Note (Signed)
Blood pressure is well controlled on today's visit. No changes made to the medications. 

## 2011-06-29 NOTE — Patient Instructions (Signed)
You are doing well. PLEASE RESTART THE LIPITOR  We will refer you to a surgeon for your carotid on the right.  Please call us if you have new issues that need to be addressed before your next appt.  The office will contact you for a follow up Appt. In 6 months

## 2011-06-29 NOTE — Assessment & Plan Note (Signed)
Aortic aneurysm appears to be 3.5 cm, previous aorto by iliac bypass grafting, patent but ectatic

## 2011-07-09 ENCOUNTER — Other Ambulatory Visit: Payer: Self-pay | Admitting: Internal Medicine

## 2011-07-09 MED ORDER — ATORVASTATIN CALCIUM 80 MG PO TABS: 80.0000 mg | ORAL_TABLET | Freq: Every day | ORAL | Status: DC

## 2011-07-09 NOTE — Telephone Encounter (Signed)
Faxed to pharmacy

## 2011-08-01 ENCOUNTER — Other Ambulatory Visit: Payer: Self-pay | Admitting: Internal Medicine

## 2011-08-10 ENCOUNTER — Encounter: Payer: Self-pay | Admitting: Vascular Surgery

## 2011-08-11 ENCOUNTER — Other Ambulatory Visit: Payer: Medicare Other

## 2011-08-11 ENCOUNTER — Encounter: Payer: Self-pay | Admitting: Vascular Surgery

## 2011-08-11 ENCOUNTER — Ambulatory Visit (INDEPENDENT_AMBULATORY_CARE_PROVIDER_SITE_OTHER): Payer: Medicare Other | Admitting: Vascular Surgery

## 2011-08-11 VITALS — BP 123/70 | HR 58 | Resp 16 | Ht 66.0 in | Wt 170.0 lb

## 2011-08-11 DIAGNOSIS — I6529 Occlusion and stenosis of unspecified carotid artery: Secondary | ICD-10-CM

## 2011-08-11 NOTE — Progress Notes (Signed)
[facility] [place]  Patient name: [name] MRN: [id number] DOB: 08/13/1927 Sex: female  REASON FOR CONSULT: evaluate critical right carotid stenosis. Referred by Dr. Dossie Arbour  HPI: [name] is a 78 y.o. female with a history of carotid disease. She has been followed by Dr. Gala Romney with bilateral carotid stenoses. She has been asymptomatic. She denies any history of stroke, TIAs, expressive or receptive aphasia, or amaurosis fugax.  She has undergone previous abdominal aortic aneurysm repair at [facility]. She's had previous percutaneous intervention of both lower extremities in  for peripheral vascular disease. She previously had claudication in the symptoms have resolved since her intervention.  She does have a history of coronary artery disease and had a myocardial infarction in January of 2007. She was treated with bare-metal stents of the obtuse marginal. She has an occluded right coronary artery and significant left circumflex disease. This is being treated medically as this was not amenable to percutaneous intervention. She has normal LV function. He has a history of mild aortic stenosis.  Also has a history of mild renal insufficiency and is followed by Dr.Coladonato. She is actually scheduled to see him Monday.  Past Medical History  Diagnosis Date  . Aortic stenosis     Moderate; Echo 12/09. moderate AS mean 19. AVA 0.88  . PAD (peripheral artery disease)     s/p iliac stents  . HTN (hypertension)   . Hyperparathyroidism   . Hypothyroidism   . GERD (gastroesophageal reflux disease)   . Depression 2004/12/16    With death of spouse  . Ventral hernia     Moderate sized  . CAD (coronary artery disease)     s/p NSTEMI 1/07 with BMS to OM; cath 12/09 due to positive Myoview, LM ok. LAD 40%. LCX 80-90% mid. RCA chronically occluded with R --> L collats  . AAA (abdominal aortic aneurysm)     Recurrent; s/p repair, now 3.8 cam; h/o ischemic  bowel December 16, 2004  . Carotid artery stenosis     R 60-79% L 0-39% (6/10) - stable  . Hyperlipidemia     Increased LFTs on Vytorin. Changed to Lipitor.  Marland Kitchen CRI (chronic renal insufficiency)     Mild, Cr 1.3  . Renal artery stenosis     Atrophic L kidney. Moderate blockage on R  . Basal cell carcinoma of nose 1980s  . Incontinence   . Full dentures   . Bruises easily   . Myocardial infarction 12/16/05    Family History  Problem Relation Age of Onset  . Heart attack Mother     MI  . Cancer Mother   . Heart disease Mother     heart attack  . Stroke Mother   . Cancer Father     throat  . Heart attack Father     MI  . Throat cancer Father   . Heart disease Father     heart attack   . Stroke Father   . Cancer Sister     breast  . Breast cancer Sister   . Hyperlipidemia Sister   . Colon cancer Neg Hx   . Hyperlipidemia Sister   . Hypertension Sister   . Hyperlipidemia Son     SOCIAL HISTORY: History  Substance Use Topics  . Smoking status: Former Games developer  . Smokeless tobacco: Never Used   Comment: quit in 1995/12/17  . Alcohol Use: No    Allergies  Allergen Reactions  . Codeine  REACTION: Rash, N \\T \ V  . Fish Oil     REACTION: chest pains  . Morphine Sulfate     REACTION: Rash, N \\T \ V  . Naproxen     REACTION: N \\T \ V  . Sulfonamide Derivatives     REACTION: Rash, N \\T \ V  . Tramadol     Current Outpatient Prescriptions  Medication Sig Dispense Refill  . aluminum-magnesium hydroxide-simethicone (MAALOX) 200-200-20 MG/5ML SUSP Take 30 mLs by mouth as needed.      Marland Kitchen aspirin 81 MG tablet Take 81 mg by mouth daily.        Marland Kitchen atorvastatin (LIPITOR) 80 MG tablet Take 1 tablet (80 mg total) by mouth daily.  90 tablet  3  . Calcium Carbonate (CALTRATE 600) 1500 MG TABS Take 1 tablet by mouth daily.        . clopidogrel (PLAVIX) 75 MG tablet TAKE 1 TABLET DAILY  90 tablet  2  . febuxostat (ULORIC) 40 MG tablet Take 80 mg by mouth daily.        . isosorbide mononitrate  (IMDUR) 30 MG 24 hr tablet TAKE 2 TABLETS DAILY  180 tablet  1  . levothyroxine (SYNTHROID, LEVOTHROID) 112 MCG tablet Take 1 tablet (112 mcg total) by mouth daily.  90 tablet  3  . lisinopril (PRINIVIL,ZESTRIL) 10 MG tablet TAKE 1 TABLET DAILY  90 tablet  8  . metoprolol tartrate (LOPRESSOR) 25 MG tablet TAKE ONE-HALF (1/2) TABLET TWICE A DAY  90 tablet  1    REVIEW OF SYSTEMS: Arly.Keller ] denotes positive finding; [  ] denotes negative finding  CARDIOVASCULAR:  [ ]  chest pain   [ ]  chest pressure   [ ]  palpitations   [ ]  orthopnea   Arly.Keller ] dyspnea on exertion- mild   [ ]  claudication   [ ]  rest pain   [ ]  DVT   [ ]  phlebitis PULMONARY:   [ ]  productive cough   [ ]  asthma   [ ]  wheezing NEUROLOGIC:   [ ]  weakness  [ ]  paresthesias  [ ]  aphasia  [ ]  amaurosis  [ ]  dizziness HEMATOLOGIC:   [ ]  bleeding problems   [ ]  clotting disorders MUSCULOSKELETAL:  [ ]  joint pain   [ ]  joint swelling [ ]  leg swelling GASTROINTESTINAL: [ ]   blood in stool  [ ]   hematemesis GENITOURINARY:  [ ]   dysuria  [ ]   hematuria PSYCHIATRIC:  [ ]  history of major depression INTEGUMENTARY:  [ ]  rashes  [ ]  ulcers CONSTITUTIONAL:  [ ]  fever   [ ]  chills  PHYSICAL EXAM: Filed Vitals:   08/11/11 1021 08/11/11 1022  BP: 114/74 123/70  Pulse: 60 58  Resp: 16   Height: 5\' 6"  (1.676 m)   Weight: 170 lb (77.111 kg)   SpO2: 99% 99%   Body mass index is 27.44 kg/(m^2). GENERAL: The patient is a well-nourished female, in no acute distress. The vital signs are documented above. CARDIOVASCULAR: There is a regular rate and rhythm without significant murmur appreciated. She has a right carotid bruit. She has palpable femoral pulses. She has a right dorsalis pedis pulse. I cannot palpate a left dorsalis pedis pulse. PULMONARY: There is good air exchange bilaterally without wheezing or rales. ABDOMEN: Soft and non-tender with normal pitched bowel sounds.  MUSCULOSKELETAL: There are no major deformities or cyanosis. NEUROLOGIC: No  focal weakness or paresthesias are detected. SKIN: There are no ulcers or rashes noted. PSYCHIATRIC: The patient  has a normal affect.  DATA:   Lab Results  Component Value Date   CREATININE 1.3* 06/11/2011   I have reviewed her records from [facility] cardiology's office. Her hyperlipidemia hypertension appear to be under good control. I have reviewed her carotid duplex scan done at the [facility] office which shows a greater than 80% right carotid stenosis. End diastolic velocity is 138 cm/s. There is a 40-59% left carotid stenosis.  MEDICAL ISSUES:  CAROTID ARTERY STENOSIS This patient was referred by Dr. Dossie Arbour with a greater than 80% right carotid stenosis. She is asymptomatic. She has a history of significant coronary artery disease. She had a myocardial infarction in 2007 which was treated with a bare-metal stent of the obtuse marginal. She has an occluded right coronary artery and significant left circumflex disease which is been treated medically as this was not amenable to percutaneous intervention. In addition she has a history of mild aortic stenosis. In the past and has been felt that she is at high risk for general anesthetic. This reason I think she should be considered for carotid stenting. In order to help determine if she is a candidate from an anatomic standpoint, I will order a CT scan of the chest and carotids to evaluate her anatomy. She does have a history of mild renal insufficiency. Her most recent creatinine was 1.3. She is followed by Dr Jeanella Craze. If he feels it is safe to use contrast we'll do the study with contrast otherwise we'll do this without contrast. I will then have her see Dr. Darrick Penna to be evaluated for carotid stenting on the right. If she is not a candidate for carotid stenting then consideration could be given to carotid endarterectomy if it was felt to be reasonably safe from a cardiac standpoint. Her risk of stroke without intervention is approximately 4% per  year.   With respect to her mild chronic renal insufficiency, I have ordered her CT scan of the chest to evaluate her anatomy for possible carotid stenting without contrast. She is scheduled to see Dr. Arrie Aran on Monday. If it is felt it would be safe to use contrast in this study can be done with contrast.  Vianca Bracher S [facility] [place] [facility]: [phone]

## 2011-08-11 NOTE — Assessment & Plan Note (Signed)
This patient was referred by Dr. Dossie Arbour with a greater than 80% right carotid stenosis. She is asymptomatic. She has a history of significant coronary artery disease. She had a myocardial infarction in 2007 which was treated with a bare-metal stent of the obtuse marginal. She has an occluded right coronary artery and significant left circumflex disease which is been treated medically as this was not amenable to percutaneous intervention. In addition she has a history of mild aortic stenosis. In the past and has been felt that she is at high risk for general anesthetic. This reason I think she should be considered for carotid stenting. In order to help determine if she is a candidate from an anatomic standpoint, I will order a CT scan of the chest and carotids to evaluate her anatomy. She does have a history of mild renal insufficiency. Her most recent creatinine was 1.3. She is followed by Dr Jeanella Craze. If he feels it is safe to use contrast we'll do the study with contrast otherwise we'll do this without contrast. I will then have her see Dr. Darrick Penna to be evaluated for carotid stenting on the right. If she is not a candidate for carotid stenting then consideration could be given to carotid endarterectomy if it was felt to be reasonably safe from a cardiac standpoint. Her risk of stroke without intervention is approximately 4% per year.

## 2011-08-13 ENCOUNTER — Other Ambulatory Visit: Payer: Self-pay | Admitting: Cardiology

## 2011-08-18 ENCOUNTER — Other Ambulatory Visit: Payer: Self-pay | Admitting: *Deleted

## 2011-08-18 ENCOUNTER — Other Ambulatory Visit: Payer: Medicare Other

## 2011-08-18 DIAGNOSIS — I6529 Occlusion and stenosis of unspecified carotid artery: Secondary | ICD-10-CM

## 2011-08-23 ENCOUNTER — Ambulatory Visit
Admission: RE | Admit: 2011-08-23 | Discharge: 2011-08-23 | Disposition: A | Discharge status: Home / Self Care | Payer: Medicare Other | Source: Ambulatory Visit | Attending: Vascular Surgery | Admitting: Vascular Surgery

## 2011-08-23 DIAGNOSIS — I6529 Occlusion and stenosis of unspecified carotid artery: Secondary | ICD-10-CM

## 2011-09-01 ENCOUNTER — Encounter: Payer: Self-pay | Admitting: Vascular Surgery

## 2011-09-02 ENCOUNTER — Ambulatory Visit (INDEPENDENT_AMBULATORY_CARE_PROVIDER_SITE_OTHER): Payer: Medicare Other | Admitting: Vascular Surgery

## 2011-09-02 VITALS — BP 101/58 | HR 67 | Resp 16 | Ht 66.0 in | Wt 168.0 lb

## 2011-09-02 DIAGNOSIS — I6529 Occlusion and stenosis of unspecified carotid artery: Secondary | ICD-10-CM

## 2011-09-02 NOTE — Progress Notes (Signed)
[facility] [place] HISTORY AND PHYSICAL   History of Present Illness:  Patient is a 78 y.o. year old female who presents for follow-up evaluation for carotid stenosis and possible stenting.   She has a known high grade right ICA stenosis.  She is on Plavix/Aspirin for antiplatelet therapy.  Her atherosclerotic risk factors remain diabetes, elevated cholesterol, hypertension, coronary artery disease.  These are all currently stable and followed by her primary care physician.  She denies any new neurologic events including amaurosis, numbness, or weakness.  Past Medical History  Diagnosis Date  . Aortic stenosis     Moderate; Echo 12/09. moderate AS mean 19. AVA 0.88  . PAD (peripheral artery disease)     s/p iliac stents  . HTN (hypertension)   . Hyperparathyroidism   . Hypothyroidism   . GERD (gastroesophageal reflux disease)   . Depression January 02, 2005    With death of spouse  . Ventral hernia     Moderate sized  . CAD (coronary artery disease)     s/p NSTEMI 1/07 with BMS to OM; cath 12/09 due to positive Myoview, LM ok. LAD 40%. LCX 80-90% mid. RCA chronically occluded with R --> L collats  . AAA (abdominal aortic aneurysm)     Recurrent; s/p repair, now 3.8 cam; h/o ischemic bowel 2005-01-02  . Carotid artery stenosis     R 60-79% L 0-39% (6/10) - stable  . Hyperlipidemia     Increased LFTs on Vytorin. Changed to Lipitor.  Marland Kitchen CRI (chronic renal insufficiency)     Mild, Cr 1.3  . Renal artery stenosis     Atrophic L kidney. Moderate blockage on R  . Basal cell carcinoma of nose 1980s  . Incontinence   . Full dentures   . Bruises easily   . Myocardial infarction Jan 02, 2006    Past Surgical History  Procedure Date  . Cholecystectomy 07/01/2005  . Breast biospy 1996  . Appendectomy 1954  . Tonsillectomy 1943  . Hernia repair 1997  . Coronary stent placement   . Breast surgery   . Abdominal aortic aneurysm repair 1995, 07/01/2005  . Angioplasty 03-Jan-2004 - 07/01/2005  .  Iliac artery stent 2004/01/03 - 07/01/2005    Stent placement, both legs - PVD in arteries  . Colonoscopy 07/07/2005    Colonic erosions, ulcers proctitis  . Upper gastrointestinal endoscopy 07/07/2005    Nml  . Nstemi 08/13/2005    1/07 s/p stent in circumflex  . Colonoscopy w/ endoscopic [place] 07/01/2005    [place] I kidney diffuse atrophy  . Cholecystectomy 1987  . Hosp 04/2005    ? abd. prob. mesenteric ischemia  . Coronary stent placement 07/2005    MI   . Hosp 01/2006    Hosp for reflux (chest pain)    Review of Systems:  Neurologic: as above Cardiac:denies shortness of breath or chest pain Pulmonary: denies cough or wheeze  Social History History  Substance Use Topics  . Smoking status: Former Games developer  . Smokeless tobacco: Never Used   Comment: quit in 01/03/1996  . Alcohol Use: No    Allergies  Allergies  Allergen Reactions  . Codeine     REACTION: Rash, N \\T \ V  . Fish Oil     REACTION: chest pains  . Morphine Sulfate     REACTION: Rash, N \\T \ V  . Naproxen     REACTION: N \\T \ V  . Sulfonamide Derivatives     REACTION: Rash, N \\T \ V  .  Tramadol      Current Outpatient Prescriptions  Medication Sig Dispense Refill  . aluminum-magnesium hydroxide-simethicone (MAALOX) 200-200-20 MG/5ML SUSP Take 30 mLs by mouth as needed.      Marland Kitchen aspirin 81 MG tablet Take 81 mg by mouth daily.        Marland Kitchen atorvastatin (LIPITOR) 80 MG tablet Take 1 tablet (80 mg total) by mouth daily.  90 tablet  3  . Calcium Carbonate (CALTRATE 600) 1500 MG TABS Take 1 tablet by mouth daily.        . clopidogrel (PLAVIX) 75 MG tablet TAKE 1 TABLET DAILY  90 tablet  2  . febuxostat (ULORIC) 40 MG tablet Take 80 mg by mouth daily.        . isosorbide mononitrate (IMDUR) 30 MG 24 hr tablet TAKE 2 TABLETS DAILY  180 tablet  1  . levothyroxine (SYNTHROID, LEVOTHROID) 112 MCG tablet Take 1 tablet (112 mcg total) by mouth daily.  90 tablet  3  . lisinopril (PRINIVIL,ZESTRIL) 10 MG tablet TAKE 1 TABLET DAILY  90  tablet  8  . metoprolol tartrate (LOPRESSOR) 25 MG tablet TAKE ONE-HALF (1/2) TABLET TWICE A DAY  90 tablet  1    Physical Examination  Filed Vitals:   09/02/11 1433 09/02/11 1435  BP: 94/61 101/58  Pulse: 74 67  Resp: 16   Height: 5\' 6"  (1.676 m)   Weight: 168 lb (76.204 kg)     Body mass index is 27.12 kg/(m^2).  General:  Alert and oriented, no acute distress HEENT: Normal Neck: No bruit or JVD Pulmonary: Clear to auscultation bilaterally Cardiac: Regular Rate and Rhythm without murmur Neurologic: Upper and lower extremity motor 5/5 and symmetric  DATA: The patient had a CT scan of the chest without contrast with reconstruction of the aortic arch. The aortic arch does seem amenable to possible carotid stenting. The level of stenosis cannot be precisely determine since contrast was not given   ASSESSMENT:   Difficult situation, the patient has known coronary disease with a mid LAD lesion that could not previously be stented. She currently does not really experience any symptoms from this. She has been told in the past by Dr. Gala Romney that she would be at high risk for general anesthesia. She also has known renal dysfunction with only a solitary functioning kidney. She would be at risk for contrast nephropathy in placement of a carotid stent. She currently is asymptomatic from her high-grade carotid stenosis. I discussed with the patient and her daughter several options today including the possibility of performing a carotid angiogram and a carotid stenting procedure as a staged procedure to try to avoid contrast nephropathy. We could also admitted to the hospital the night prior for IV hydration. This would lower her risk of contrast nephropathy. I also discussed with him the possibility of an open carotid endarterectomy that would have some myocardial risk associated with it. I also discussed with him continued medical management with aspirin and Plavix especially since she is  asymptomatic. They are going to weigh these options and get back with me if they wish to schedule a carotid angiogram in the near future.   Fabienne Bruns, MD [facility] [place] Office: [phone] Pager: [phone]

## 2011-09-03 ENCOUNTER — Telehealth: Payer: Self-pay | Admitting: Cardiovascular Disease

## 2011-09-03 ENCOUNTER — Telehealth: Payer: Self-pay | Admitting: *Deleted

## 2011-09-03 NOTE — Telephone Encounter (Signed)
If it continues to be low, could hold one of the imdur tabs.

## 2011-09-03 NOTE — Telephone Encounter (Signed)
Pt daughter calling stating that her BP is running lower than normal. She was seen at [facility]. Was seen for her carotid. Wants to know if there is something they need to change with her meds.  101/58 right 94/61 left

## 2011-09-03 NOTE — Telephone Encounter (Signed)
09/03/11--received instructions back from dr Mariah Milling about ms Mairena's BP--CALLED AND lm FOR DAUGHTER TO PHONE ME BACK--NT

## 2011-09-03 NOTE — Telephone Encounter (Signed)
09/03/11--pt's daughter  calling stating mom's  BP  Is down and when they saw dr fields about endarterectomy in future, dr fields was  also concerned about lower BP--advised i would forward message to dr Mariah Milling, but in meantime try to cut lisinopril to 5mg  qd and take lisinopril in am and metoprolol in pm--also advised to keep BP record and if any problems please go to nearest ED--PT'S DAUGHTER AGREES--NT

## 2011-09-03 NOTE — Telephone Encounter (Signed)
09/03/11--again spoke with ms Caetano's daughter and advised about message i received from dr Mariah Milling --may decrease imdur to once daily instead of cutting lisinopril --per daughter, she already set up mom's pill box with the decreased lisinopril and would rather stick with that than try to change again--advised that's OK but after 1 week, if BP not better, please try cutting back on imdur--pt's daughter agrees--nt

## 2011-10-05 ENCOUNTER — Telehealth: Payer: Self-pay | Admitting: *Deleted

## 2011-10-05 NOTE — Telephone Encounter (Signed)
Pt's daughter [name] said they had discussed it and decided to hold off surgery.

## 2011-10-18 ENCOUNTER — Other Ambulatory Visit (HOSPITAL_COMMUNITY): Payer: Self-pay | Admitting: Internal Medicine

## 2011-12-05 ENCOUNTER — Other Ambulatory Visit: Payer: Self-pay | Admitting: Family Medicine

## 2011-12-07 ENCOUNTER — Telehealth: Payer: Self-pay | Admitting: Family Medicine

## 2011-12-07 DIAGNOSIS — E785 Hyperlipidemia, unspecified: Secondary | ICD-10-CM

## 2011-12-07 NOTE — Telephone Encounter (Signed)
Message copied by Excell Seltzer on Tue Dec 07, 2011 10:39 AM ------      Message from: Baldomero Lamy      Created: Wed Dec 01, 2011 10:23 AM      Regarding: 6 mo f/u labsFri 5/31       Please order  future 6 mo f/u labs for pt's upcomming lab appt.      Thanks      Rodney Booze

## 2011-12-10 ENCOUNTER — Other Ambulatory Visit (INDEPENDENT_AMBULATORY_CARE_PROVIDER_SITE_OTHER): Payer: Medicare Other

## 2011-12-10 DIAGNOSIS — E785 Hyperlipidemia, unspecified: Secondary | ICD-10-CM

## 2011-12-10 LAB — COMPREHENSIVE METABOLIC PANEL
ALT: 14 U/L (ref 0–35)
AST: 19 U/L (ref 0–37)
Alkaline Phosphatase: 70 U/L (ref 39–117)
BUN: 31 mg/dL — ABNORMAL HIGH (ref 6–23)
Chloride: 108 mEq/L (ref 96–112)
Creatinine, Ser: 1.4 mg/dL — ABNORMAL HIGH (ref 0.4–1.2)
Total Bilirubin: 0.6 mg/dL (ref 0.3–1.2)

## 2011-12-10 LAB — LIPID PANEL
HDL: 46.8 mg/dL (ref >39.00)
Total CHOL/HDL Ratio: 4
Triglycerides: 232 mg/dL — ABNORMAL HIGH (ref 0.0–149.0)
VLDL: 46.4 mg/dL — ABNORMAL HIGH (ref 0.0–40.0)

## 2011-12-17 ENCOUNTER — Ambulatory Visit: Payer: Medicare Other | Admitting: Family Medicine

## 2011-12-24 ENCOUNTER — Ambulatory Visit: Payer: Medicare Other | Admitting: Family Medicine

## 2011-12-28 ENCOUNTER — Ambulatory Visit (INDEPENDENT_AMBULATORY_CARE_PROVIDER_SITE_OTHER): Payer: Medicare Other | Admitting: Family Medicine

## 2011-12-28 ENCOUNTER — Encounter: Payer: Self-pay | Admitting: Family Medicine

## 2011-12-28 VITALS — BP 130/78 | HR 60 | Temp 98.3°F | Wt 175.0 lb

## 2011-12-28 DIAGNOSIS — E039 Hypothyroidism, unspecified: Secondary | ICD-10-CM

## 2011-12-28 DIAGNOSIS — I6529 Occlusion and stenosis of unspecified carotid artery: Secondary | ICD-10-CM

## 2011-12-28 DIAGNOSIS — E785 Hyperlipidemia, unspecified: Secondary | ICD-10-CM

## 2011-12-28 DIAGNOSIS — F320 Major depressive disorder, single episode, mild: Secondary | ICD-10-CM

## 2011-12-28 DIAGNOSIS — F329 Major depressive disorder, single episode, unspecified: Secondary | ICD-10-CM

## 2011-12-28 DIAGNOSIS — I10 Essential (primary) hypertension: Secondary | ICD-10-CM

## 2011-12-28 NOTE — Patient Instructions (Addendum)
Continue working on regular exercise and healthy eating. Follow up for medicare wellness , subsequent in 6 months.

## 2011-12-28 NOTE — Assessment & Plan Note (Signed)
LDL now at goal taking lipitor daily.  Cannot tolerate fish oil to lower triglycerides. Encouraged exercise, weight loss, healthy eating habits.

## 2011-12-28 NOTE — Assessment & Plan Note (Signed)
Resolved with move to area where there are more people. Living near her sister. Staying active.

## 2011-12-28 NOTE — Assessment & Plan Note (Signed)
Well controlled. Continue current medication.  

## 2011-12-28 NOTE — Progress Notes (Signed)
  Subjective:    Patient ID: [name], female    DOB: 1928-05-11, 78 y.o.   MRN: [id number]  HPI  78 year old female here for 6 month follow up.. Doing well.  CRF: followed by Dr. Tyrone Schimke.   Hypertension: Well controlled on current meds.   On lisinopril and lopressor Using medication without problems or lightheadedness: None  Chest pain with exertion:None  Edema:None  Short of breath: stable  Average home BPs:occ  Other issues:   Exercise: walking daily.  Diet: poor recently due to move.   Elevated Cholesterol:Well controlled now taking liptior every day. Walking daily. Using medications without problems:None  Muscle aches: None  No weight gain.   Gout: on uloric for prevention. Followed by Dr. Dareen Piano.  CAD: Will now be followed by Dr. Mariah Milling.  Has ECHO, carotid dopplers and aortic eval.  Dopplers showed blockage 80 % on right... Saw vascular MD. Plan is to follow it.      Review of Systems  Constitutional: Negative for fever and fatigue.  HENT: Negative for ear pain.   Eyes: Negative for pain.  Respiratory: Negative for chest tightness and shortness of breath.   Cardiovascular: Negative for chest pain, palpitations and leg swelling.  Gastrointestinal: Negative for abdominal pain.  Genitourinary: Negative for dysuria.       Objective:   Physical Exam  Constitutional: Vital signs are normal. She appears well-developed and well-nourished. She is cooperative.  Non-toxic appearance. She does not appear ill. No distress.       Elderly female   HENT:  Head: Normocephalic.  Right Ear: Hearing, tympanic membrane, external ear and ear canal normal.  Left Ear: Hearing, tympanic membrane, external ear and ear canal normal.  Nose: Nose normal.  Eyes: Conjunctivae, EOM and lids are normal. Pupils are equal, round, and reactive to light. No foreign bodies found.  Neck: Trachea normal and normal range of motion. Neck supple. Carotid bruit is not present. No mass  and no thyromegaly present.  Cardiovascular: Normal rate, regular rhythm, S1 normal, S2 normal and intact distal pulses.  Exam reveals no gallop.   Murmur heard.  Crescendo systolic murmur is present with a grade of 3/6       Greatest at RUSB  Pulmonary/Chest: Effort normal and breath sounds normal. No respiratory distress. She has no wheezes. She has no rhonchi. She has no rales.  Abdominal: Soft. Normal appearance and bowel sounds are normal. She exhibits no distension, no fluid wave, no abdominal bruit and no mass. There is no hepatosplenomegaly. There is no tenderness. There is no rebound, no guarding and no CVA tenderness. No hernia.  Lymphadenopathy:    She has no cervical adenopathy.    She has no axillary adenopathy.  Neurological: She is alert. She has normal strength. No cranial nerve deficit or sensory deficit.  Skin: Skin is warm, dry and intact. Rash noted.       Dry flaky patches on legs  Psychiatric: She has a normal mood and affect. Her speech is normal and behavior is normal. Judgment and thought content normal. Her mood appears not anxious. Cognition and memory are normal. She does not exhibit a depressed mood.          Assessment & Plan:

## 2011-12-28 NOTE — Assessment & Plan Note (Signed)
Lab Results  Component Value Date   TSH 0.38 06/11/2011

## 2011-12-30 ENCOUNTER — Encounter: Payer: Self-pay | Admitting: Cardiovascular Disease

## 2011-12-30 ENCOUNTER — Ambulatory Visit (INDEPENDENT_AMBULATORY_CARE_PROVIDER_SITE_OTHER): Payer: Medicare Other | Admitting: Cardiovascular Disease

## 2011-12-30 VITALS — BP 120/64 | HR 57 | Ht 66.0 in | Wt 176.0 lb

## 2011-12-30 DIAGNOSIS — I6529 Occlusion and stenosis of unspecified carotid artery: Secondary | ICD-10-CM

## 2011-12-30 DIAGNOSIS — I739 Peripheral vascular disease, unspecified: Secondary | ICD-10-CM

## 2011-12-30 DIAGNOSIS — N259 Disorder resulting from impaired renal tubular function, unspecified: Secondary | ICD-10-CM

## 2011-12-30 DIAGNOSIS — I2510 Atherosclerotic heart disease of native coronary artery without angina pectoris: Secondary | ICD-10-CM

## 2011-12-30 DIAGNOSIS — E785 Hyperlipidemia, unspecified: Secondary | ICD-10-CM

## 2011-12-30 DIAGNOSIS — I10 Essential (primary) hypertension: Secondary | ICD-10-CM

## 2011-12-30 NOTE — Assessment & Plan Note (Signed)
Currently with no symptoms of angina. No further workup at this time. Continue current medication regimen. 

## 2011-12-30 NOTE — Assessment & Plan Note (Signed)
Continue aggressive cholesterol management. Goal LDL less than 70. We have encouraged aggressive diet and exercise.

## 2011-12-30 NOTE — Patient Instructions (Addendum)
You are doing well. No medication changes were made.  We will schedule you for a carotid ultrasound for stenosis  Please call us if you have new issues that need to be addressed before your next appt.  Your physician wants you to follow-up in: 6 months after carotid in 06/2012. You will receive a reminder letter in the mail two months in advance. If you don't receive a letter, please call our office to schedule the follow-up appointment.

## 2011-12-30 NOTE — Assessment & Plan Note (Signed)
We will repeat carotid ultrasound in closely monitor for any progression. If there is significant progression, she would pursue staged carotid stenting in [place].

## 2011-12-30 NOTE — Progress Notes (Signed)
Patient ID: [name], female    DOB: 02/04/1928, 78 y.o.   MRN: [id number]  HPI Comments: [name] is an 78 year old woman with history of coronary artery disease, status post non-ST-elevation myocardial infarction in January 2007, treated with bare-metal stenting to the obtuse marginal.  She has a totally occluded right coronary with distal collateralization and signifcant LCX disease which is being treated medically as it is not easily amenable to intervention. She has normal LV function, Peripheral vascular disease  with previous abdominal aortic aneurysm repair and bilateral iliac stenting, carotid artery stenosis, renal artery stenosis with an atrophic left kidney and a moderate blockage on the right, hypertension and hyperlipidemia. Also with mild aortic stenosis.  She returns today for routine. She has no new complaints   carotid ultrasound December 2012 showed progression of the stenosis on the right. Estimate is 80-99%.  Aortic ultrasound showed tortuous ectatic aorta biiliac bypass graft. Maximum dimensions 3.5 x 3.3 cm, dilated left common iliac artery 2 cm Recent echocardiogram EF 55-65% Mild to moderate AS   EKG shows sinus bradycardia with rate 57 beats per minute with no significant ST or T wave changes       Outpatient Encounter Prescriptions as of 12/30/2011  Medication Sig Dispense Refill  . aluminum-magnesium hydroxide-simethicone (MAALOX) 200-200-20 MG/5ML SUSP Take 30 mLs by mouth as needed.      Marland Kitchen aspirin 81 MG tablet Take 81 mg by mouth daily.        Marland Kitchen atorvastatin (LIPITOR) 80 MG tablet Take 1 tablet (80 mg total) by mouth daily.  90 tablet  3  . Calcium Carbonate (CALTRATE 600) 1500 MG TABS Take 1 tablet by mouth daily.        . clopidogrel (PLAVIX) 75 MG tablet TAKE 1 TABLET DAILY  90 tablet  2  . febuxostat (ULORIC) 40 MG tablet Take 80 mg by mouth daily.        . isosorbide mononitrate (IMDUR) 30 MG 24 hr tablet TAKE 2 TABLETS DAILY  180 tablet  0  .  levothyroxine (SYNTHROID, LEVOTHROID) 112 MCG tablet TAKE 1 TABLET DAILY  90 tablet  2  . lisinopril (PRINIVIL,ZESTRIL) 10 MG tablet       . metoprolol tartrate (LOPRESSOR) 25 MG tablet TAKE ONE-HALF (1/2) TABLET TWICE A DAY  90 tablet  1   Review of Systems  Constitutional: Negative.   HENT: Negative.   Eyes: Negative.   Respiratory: Negative.   Cardiovascular: Negative.   Gastrointestinal: Negative.        Pain periodically from ventral hernia  Musculoskeletal: Negative.   Skin: Negative.   Neurological: Negative.   Hematological: Negative.   Psychiatric/Behavioral: Negative.   All other systems reviewed and are negative.    BP 120/64  Pulse 57  Ht 5\' 6"  (1.676 m)  Wt 176 lb (79.833 kg)  BMI 28.41 kg/m2  Physical Exam  Nursing note and vitals reviewed. Constitutional: She is oriented to person, place, and time. She appears well-developed and well-nourished.  HENT:  Head: Normocephalic.  Nose: Nose normal.  Mouth/Throat: Oropharynx is clear and moist.  Eyes: Conjunctivae are normal. Pupils are equal, round, and reactive to light.  Neck: Normal range of motion. Neck supple. No JVD present. Carotid bruit is present.  Cardiovascular: Normal rate, regular rhythm, S1 normal, S2 normal and intact distal pulses.  Exam reveals no gallop and no friction rub.   Murmur heard.  Systolic murmur is present with a grade of 2/6  Pulmonary/Chest: Effort  normal and breath sounds normal. No respiratory distress. She has no wheezes. She has no rales. She exhibits no tenderness.  Abdominal: Soft. Bowel sounds are normal. She exhibits no distension. There is no tenderness.  Musculoskeletal: Normal range of motion. She exhibits no edema and no tenderness.  Lymphadenopathy:    She has no cervical adenopathy.  Neurological: She is alert and oriented to person, place, and time. Coordination normal.  Skin: Skin is warm and dry. No rash noted. No erythema.  Psychiatric: She has a normal mood and  affect. Her behavior is normal. Judgment and thought content normal.         Assessment and Plan

## 2011-12-30 NOTE — Assessment & Plan Note (Signed)
Will repeat abdominal aortic ultrasound December 2013, annual basis.

## 2011-12-30 NOTE — Assessment & Plan Note (Signed)
Blood pressure is well controlled on today's visit. No changes made to the medications. 

## 2011-12-30 NOTE — Assessment & Plan Note (Signed)
Stable function, followed by renal service in [place].

## 2012-01-06 ENCOUNTER — Encounter (INDEPENDENT_AMBULATORY_CARE_PROVIDER_SITE_OTHER): Payer: Medicare Other

## 2012-01-06 DIAGNOSIS — R0989 Other specified symptoms and signs involving the circulatory and respiratory systems: Secondary | ICD-10-CM

## 2012-01-06 DIAGNOSIS — I6529 Occlusion and stenosis of unspecified carotid artery: Secondary | ICD-10-CM

## 2012-01-16 ENCOUNTER — Other Ambulatory Visit (HOSPITAL_COMMUNITY): Payer: Self-pay | Admitting: Internal Medicine

## 2012-01-17 ENCOUNTER — Other Ambulatory Visit: Payer: Self-pay

## 2012-01-17 DIAGNOSIS — I6529 Occlusion and stenosis of unspecified carotid artery: Secondary | ICD-10-CM

## 2012-01-18 ENCOUNTER — Other Ambulatory Visit: Payer: Self-pay | Admitting: Family Medicine

## 2012-02-07 ENCOUNTER — Other Ambulatory Visit: Payer: Self-pay | Admitting: Cardiology

## 2012-02-09 ENCOUNTER — Encounter: Payer: Self-pay | Admitting: Vascular Surgery

## 2012-02-10 ENCOUNTER — Encounter: Payer: Self-pay | Admitting: Vascular Surgery

## 2012-02-10 ENCOUNTER — Ambulatory Visit (INDEPENDENT_AMBULATORY_CARE_PROVIDER_SITE_OTHER): Payer: Medicare Other | Admitting: Vascular Surgery

## 2012-02-10 ENCOUNTER — Other Ambulatory Visit (INDEPENDENT_AMBULATORY_CARE_PROVIDER_SITE_OTHER): Payer: Medicare Other | Admitting: *Deleted

## 2012-02-10 VITALS — BP 113/68 | HR 53 | Resp 18 | Ht 66.0 in | Wt 178.4 lb

## 2012-02-10 DIAGNOSIS — I6529 Occlusion and stenosis of unspecified carotid artery: Secondary | ICD-10-CM

## 2012-02-10 NOTE — Patient Instructions (Signed)
Stroke Prevention Some medical conditions and some behaviors are associated with an increased chance of having a stroke. You may prevent a stroke by making healthy choices and managing medical conditions. You can reduce your risk of having a stroke by:  Staying physically active. It is recommended that you get at least 30 minutes of activity on most or all days.   Stop smoking.   Limiting alcohol use. Moderate alcohol use is considered to be: No more than 2 drinks per day for men. No more than 1 drink per day for non-pregnant women.   DIET.  A low fat, low cholesterol, low salt and high fiber diet with servings of fruits and vegetables daily will help .   Managing your cholesterol levels.. Take any prescribed medications to control cholesterol as directed by your caregiver.   Managing your diabetes. Diabetic diet as recommended by the ADA. Take any prescribed medications to control diabetes as directed by your caregiver.   Controlling your high blood pressure (hypertension). It is very important to take any prescribed medications to control hypertension .   Aspirin: Take Aspirin daily as prescribed by your physician. Do not take aspirin with blood thinners unless prescribed by your Physician  You may be on "blood thinners" (anticoagulants) Coumadin, Plavix, Agrennox, Effient. All these medications are helpful in reducing the risk of forming abnormal blood clots. If you have the irregular heart rhythm of atrial fibrillation, you may be on a blood thinner.  Be sure you understand all your medication instructions.   SIGNS OF STROKE: SEEK IMMEDIATE MEDICAL CARE IF: You have sudden weakness or numbness of the face, arm, or leg, especially on 1 side of the body.  You have sudden confusion.  You have trouble speaking (aphasia) or understanding.  You have sudden trouble seeing in 1 or both eyes.  You have sudden trouble walking.  You have dizziness.  You have a loss of balance or coordination.   You have a sudden severe headache with no known cause.    You have new chest pain, angina, or an irregular heartbeat.   ANY OF THESE SYMPTOMS MAY REPRESENT A SERIOUS PROBLEM THAT IS AN EMERGENCY. Do not wait to see if the symptoms will go away. Get medical help right away. Call your local emergency services (911 in [place] DO NOT drive yourself to the hospital. Stroke Prevention Some medical conditions and some behaviors are associated with an increased chance of having a stroke. You may prevent a stroke by making healthy choices and managing medical conditions. You can reduce your risk of having a stroke by:  Staying physically active. It is recommended that you get at least 30 minutes of activity on most or all days.   Not smoking.   Limiting alcohol use. Moderate alcohol use is considered to be:   No more than 2 drinks per day for men.   No more than 1 drink per day for nonpregnant women.   Eating healthy foods. Certain diets may be prescribed to address high blood pressure, high cholesterol, diabetes, or obesity. A diet that includes 5 or more servings of fruits and vegetables a day may reduce the risk of stroke.   Managing your cholesterol levels. A low saturated fat, low trans fat, low cholesterol, and high fiber diet may control cholesterol levels. Take any prescribed medications to control cholesterol as directed by your caregiver.   Managing your diabetes. A controlled carbohydrate, controlled sugar diet is recommended to manage diabetes. Take any prescribed medications to   control diabetes as directed by your caregiver.   Controlling your high blood pressure (hypertension). A low salt (sodium), low saturated fat, low trans fat, and low cholesterol diet is recommended to manage high blood pressure. Take any prescribed medications to control hypertension as directed by your caregiver.   Maintaining a healthy weight. A reduced calorie, low sodium, low saturated fat, low trans fat, low  cholesterol diet is recommended to manage weight.   Stopping drug abuse.   Taking medications as directed by your caregiver. For some individuals, aspirin or "blood thinners" (anticoagulants) are helpful in reducing the risk of forming abnormal blood clots that can lead to stroke. If you have the irregular heart rhythm of atrial fibrillation, you should be on a blood thinner unless there is a good reason you cannot take them. Be sure you understand all your medication instructions.  SEEK IMMEDIATE MEDICAL CARE IF:  You have sudden weakness or numbness of the face, arm, or leg, especially on 1 side of the body.   You have sudden confusion.   You have trouble speaking (aphasia) or understanding.   You have sudden trouble seeing in 1 or both eyes.   You have sudden trouble walking.   You have a loss of balance or coordination.   You have a sudden severe headache with no known cause.    ANY OF THESE SYMPTOMS MAY REPRESENT A SERIOUS PROBLEM THAT IS AN EMERGENCY. Do not wait to see if the symptoms will go away. Get medical help right away. Call your local emergency services (911 in [place] DO NOT drive yourself to the hospital. Document Released: 08/05/2004 Document Re-Released: 12/16/2009 [employer] Patient Information 2011 [employer].  

## 2012-02-10 NOTE — Progress Notes (Addendum)
Established Carotid Patient CC: Severe asymptomatic R ICA stenosis Referring physician: Dr. Mariah Milling  History of Present Illness  [name] is a 78 y.o. female who has known carotid stenosis. She has severe CAD as well as solitary functioning kidney. Pt returns today as right carotid velocities continue to climb. Pt continues to deny symptoms, her cholesterol is now WNL and she is on an exercise program  Patient has not had previous Right CEA.  Patient has Negative history of TIA or stroke symptom.  The patient denies amaurosis fugax or monocular blindness.  The patient  denies facial drooping.  Pt. denies hemiplegia.  The patient denies receptive or expressive aphasia.  Pt. denies weakness; BUE;  Non-Invasive Vascular Imaging CAROTID DUPLEX 02/10/2012   Right ICA 80 - 99 % stenosis - PSV/EDV - 561/259 Left ICA 40 - 59 % stenosis  Previous carotid studies demonstrated: RICA 80 - 99 % stenosis, LICA 40 - 59 % stenosis.  Previous angiogram: No: sec to CRI  Past Medical History  Diagnosis Date  . Aortic stenosis     Moderate; Echo 12/09. moderate AS mean 19. AVA 0.88  . PAD (peripheral artery disease)     s/p iliac stents  . HTN (hypertension)   . Hyperparathyroidism   . Hypothyroidism   . GERD (gastroesophageal reflux disease)   . Depression 01/04/05    With death of spouse  . Ventral hernia     Moderate sized  . CAD (coronary artery disease)     s/p NSTEMI 1/07 with BMS to OM; cath 12/09 due to positive Myoview, LM ok. LAD 40%. LCX 80-90% mid. RCA chronically occluded with R --> L collats  . AAA (abdominal aortic aneurysm)     Recurrent; s/p repair, now 3.8 cam; h/o ischemic bowel 01-04-2005  . Carotid artery stenosis     R 60-79% L 0-39% (6/10) - stable  . Hyperlipidemia     Increased LFTs on Vytorin. Changed to Lipitor.  Marland Kitchen CRI (chronic renal insufficiency)     Mild, Cr 1.3  . Renal artery stenosis     Atrophic L kidney. Moderate blockage on R  . Basal cell carcinoma of nose  1980s  . Incontinence   . Full dentures   . Bruises easily   . Myocardial infarction January 04, 2006    Social History History  Substance Use Topics  . Smoking status: Former Smoker    Quit date: 07/12/2005  . Smokeless tobacco: Never Used   Comment: quit in 1996/01/05  . Alcohol Use: No    Family History Family History  Problem Relation Age of Onset  . Heart attack Mother     MI  . Cancer Mother   . Heart disease Mother     heart attack  . Stroke Mother   . Cancer Father     throat  . Heart attack Father     MI  . Throat cancer Father   . Heart disease Father     heart attack   . Stroke Father   . Cancer Sister     breast  . Breast cancer Sister   . Hyperlipidemia Sister   . Colon cancer Neg Hx   . Hyperlipidemia Sister   . Hypertension Sister   . Hyperlipidemia Son     Review of Systems : [x]  Positive   [ ]  Denies  General:[ ]  Weight loss,  [ ]  Weight gain, [ ]  Loss of appetite, [ ]  Fever, [ ]  chills  Neurologic: [ ]   Dizziness, [ ]  Blackouts, [ ]  Headaches, [ ]  Seizure [ ]  Stroke, [ ]  "Mini stroke", [ ]  Slurred speech, [ ]  Temporary blindness;  [ ] weakness,  Ear/Nose/Throat: [ ]  Change in hearing, [ ]  Nose bleeds, [ ]  Hoarseness  Vascular:[ ]  Pain in legs with walking, [ ]  Pain in feet while lying flat , [ ]   Non-healing ulcer, [ ]  Blood clot in vein,    Pulmonary: [ ]  Home oxygen, [ ]   Productive cough, [ ]  Bronchitis, [ ]  Coughing up blood,  [ ]  Asthma, [ ]  Wheezing  Musculoskeletal:  [ ]  Arthritis, [ ]  Joint pain, [ ]  low back pain  Cardiac: [ ]  Chest pain, [ ]  Shortness of breath when lying flat, [ ]  Shortness of breath with exertion, [ ]  Palpitations, [ ]  Heart murmur, [ ]   Atrial fibrillation  Hematologic:[ ]  Easy Bruising, [ ]  Anemia; [ ]  Hepatitis  Psychiatric: [ ]   Depression, [ ]  Anxiety   Gastrointestinal: [ ]  Black stool, [ ]  Blood in stool, [ ]  Peptic ulcer disease,  [ ]  Gastroesophageal Reflux, [ ]  Trouble swallowing, [ ]  Diarrhea, [ ]   Constipation  Urinary: [ ]  chronic Kidney disease, [ ]  on HD, [ ]  Burning with urination, [ ]  Frequent urination, [ ]  Difficulty urinating;   Skin: [ ]  Rashes, [ ]  Wounds   Allergies  Allergen Reactions  . Codeine     REACTION: Rash, N \\T \ V  . Fish Oil     REACTION: chest pains  . Morphine Sulfate     REACTION: Rash, N \\T \ V  . Naproxen     REACTION: N \\T \ V  . Sulfonamide Derivatives     REACTION: Rash, N \\T \ V  . Tramadol     Current Outpatient Prescriptions  Medication Sig Dispense Refill  . aluminum-magnesium hydroxide-simethicone (MAALOX) 200-200-20 MG/5ML SUSP Take 30 mLs by mouth as needed.      Marland Kitchen aspirin 81 MG tablet Take 81 mg by mouth daily.        Marland Kitchen atorvastatin (LIPITOR) 80 MG tablet Take 1 tablet (80 mg total) by mouth daily.  90 tablet  3  . Calcium Carbonate (CALTRATE 600) 1500 MG TABS Take 1 tablet by mouth daily.        . clopidogrel (PLAVIX) 75 MG tablet TAKE 1 TABLET DAILY  90 tablet  1  . febuxostat (ULORIC) 40 MG tablet Take 80 mg by mouth daily.       . isosorbide mononitrate (IMDUR) 30 MG 24 hr tablet TAKE 2 TABLETS DAILY  180 tablet  1  . levothyroxine (SYNTHROID, LEVOTHROID) 112 MCG tablet TAKE 1 TABLET DAILY  90 tablet  2  . lisinopril (PRINIVIL,ZESTRIL) 10 MG tablet 10 mg. Takes 1/2 tablet twice daily.      . metoprolol tartrate (LOPRESSOR) 25 MG tablet TAKE ONE-HALF (1/2) TABLET TWICE A DAY  90 tablet  2    Physical Examination  Filed Vitals:   02/10/12 1457  BP: 113/68  Pulse: 53  Resp: 18    General: WDWN female in NAD GAIT: normal Eyes: PERRLA Pulmonary:  CTAB, Negative  Rales, Negative rhonchi, & Negative wheezing,  Cardiac: regular Rhythm ,  Positive SEM heard best at RSB  Vascular:     RIGHT LEFT CAROTID BRUITS Negative Negative   Musculoskeletal: good and equal strength in BUE/BLE  Neurologic: A&O X 3; Appropriate Affect ; SENSATION ;normal; MOTOR FUNCTION: normal 5/5 strength in all tested muscle groups Speech is  normal  Assessment: [name] is a 78 y.o. female who presents with:Asymptomatic80 - 99 % Right ICA  stenosis The  ICA stenosis is  unchanged but the velocities are significantly higher from previous exam EDV 199 - 256 Dr. Darrick Penna has had extensive discussion with Pt and her daughter regarding risk of stroke. Pt is not a candidate for an elective Carotid Endarterectomy sec to severe CAD. If pt develops symptoms she may be a candidate for Carotid stent with pre -op admission for hydration to protect her kidney function  Plan: Follow-up in 6 months with Carotid Duplex scan and MD visit She will call if she exhibits any signs of TIA or stroke  Pt was given information regarding stroke symptoms and prevention Continue medication and exercise to reduce cholesterol and triglycerides  Clinic MD: [facility]  History and exam details as above   Pt with high grade asymptomatic right ICA stenosis.  She is very high cardiac risk for open CEA.  She is at risk for contrast nephropathy with carotid stenting.  She is on Plavix/ASA and her cholesterol was less than 200 on her most recent labwork.  Homero Fellers and lengthy discussion held with pt and her daughter regarding CEA, carotid stent, or medical management.  Risk of stroke with this stenosis is on the order of 5 % per year.  Risk of morbidity/mortality/stroke from CEA or carotid stenting probably exceeds this.  We will repeat carotid duplex in 6 months.  If she develops symptoms may need to reconsider possible carotid stent.  Fabienne Bruns, MD [facility] Office: [phone] Pager: [phone]

## 2012-02-24 NOTE — Procedures (Unsigned)
CAROTID DUPLEX EXAM  INDICATION:  Verify critical stenosis found on outside facility's exam. Limited evaluation of the bilateral carotid arteries.  HISTORY: Diabetes: Cardiac:  Yes Hypertension:  Yes Smoking:  Yes Previous Surgery: CV History: Amaurosis Fugax No, Paresthesias No, Hemiparesis No                                      RIGHT                 LEFT Brachial systolic pressure: Brachial Doppler waveforms: Vertebral direction of flow: DUPLEX VELOCITIES (cm/sec) CCA peak systolic                   31                    43 ECA peak systolic ICA peak systolic                   561                   112 ICA end diastolic                   259                   40 PLAQUE MORPHOLOGY:                  Heterogeneous/irregular                 Heterogeneous PLAQUE AMOUNT:                      Severe                Moderate PLAQUE LOCATION:                    ICA/ECA               CCA/ECA/ICA  IMPRESSION: 1. Greater than 80% stenosis of the right ICA.  The lesion begins at     the ICA origin and is approximately 1.8 cm in length, with the     tightest area being at the distal edge. 2. 40 to 59% left ICA stenosis.  ___________________________________________ Janetta Hora Fields, MD  LT/MEDQ  D:  02/11/2012  T:  02/11/2012  Job:  [id number]

## 2012-03-15 ENCOUNTER — Encounter: Payer: Self-pay | Admitting: Gastroenterology

## 2012-05-17 ENCOUNTER — Ambulatory Visit: Payer: Medicare Other

## 2012-06-06 ENCOUNTER — Telehealth: Payer: Self-pay | Admitting: Family Medicine

## 2012-06-06 DIAGNOSIS — E785 Hyperlipidemia, unspecified: Secondary | ICD-10-CM

## 2012-06-06 DIAGNOSIS — M109 Gout, unspecified: Secondary | ICD-10-CM | POA: Insufficient documentation

## 2012-06-06 DIAGNOSIS — M810 Age-related osteoporosis without current pathological fracture: Secondary | ICD-10-CM

## 2012-06-06 DIAGNOSIS — E039 Hypothyroidism, unspecified: Secondary | ICD-10-CM

## 2012-06-06 NOTE — Telephone Encounter (Signed)
Message copied by Excell Seltzer on Tue Jun 06, 2012 11:09 PM ------      Message from: [name]      Created: Thu Jun 01, 2012  1:50 PM      Regarding: Cpx labs 12/4 Wed       Please order  future cpx labs for pt's upcomming lab appt.      Thanks      Rodney Booze

## 2012-06-14 ENCOUNTER — Ambulatory Visit (INDEPENDENT_AMBULATORY_CARE_PROVIDER_SITE_OTHER): Payer: Medicare Other

## 2012-06-14 ENCOUNTER — Other Ambulatory Visit: Payer: Medicare Other

## 2012-06-14 ENCOUNTER — Other Ambulatory Visit (INDEPENDENT_AMBULATORY_CARE_PROVIDER_SITE_OTHER): Payer: Medicare Other

## 2012-06-14 DIAGNOSIS — E039 Hypothyroidism, unspecified: Secondary | ICD-10-CM

## 2012-06-14 DIAGNOSIS — Z23 Encounter for immunization: Secondary | ICD-10-CM

## 2012-06-14 DIAGNOSIS — M109 Gout, unspecified: Secondary | ICD-10-CM

## 2012-06-14 DIAGNOSIS — M810 Age-related osteoporosis without current pathological fracture: Secondary | ICD-10-CM

## 2012-06-14 DIAGNOSIS — E785 Hyperlipidemia, unspecified: Secondary | ICD-10-CM

## 2012-06-14 LAB — LIPID PANEL
HDL: 44 mg/dL (ref >39.00)
Triglycerides: 273 mg/dL — ABNORMAL HIGH (ref 0.0–149.0)
VLDL: 54.6 mg/dL — ABNORMAL HIGH (ref 0.0–40.0)

## 2012-06-14 LAB — COMPREHENSIVE METABOLIC PANEL
Alkaline Phosphatase: 50 U/L (ref 39–117)
BUN: 21 mg/dL (ref 6–23)
Glucose, Bld: 102 mg/dL — ABNORMAL HIGH (ref 70–99)
Sodium: 139 mEq/L (ref 135–145)
Total Bilirubin: 0.7 mg/dL (ref 0.3–1.2)
Total Protein: 6.9 g/dL (ref 6.0–8.3)

## 2012-06-14 LAB — URIC ACID: Uric Acid, Serum: 3.8 mg/dL (ref 2.4–7.0)

## 2012-06-14 LAB — LDL CHOLESTEROL, DIRECT: Direct LDL: 77.9 mg/dL

## 2012-06-14 LAB — TSH: TSH: 5.08 u[IU]/mL (ref 0.35–5.50)

## 2012-06-21 ENCOUNTER — Other Ambulatory Visit: Payer: Self-pay | Admitting: Family Medicine

## 2012-06-23 ENCOUNTER — Ambulatory Visit (INDEPENDENT_AMBULATORY_CARE_PROVIDER_SITE_OTHER): Payer: Medicare Other | Admitting: Family Medicine

## 2012-06-23 ENCOUNTER — Encounter: Payer: Self-pay | Admitting: Family Medicine

## 2012-06-23 VITALS — BP 130/86 | HR 49 | Temp 98.5°F | Ht 66.0 in | Wt 183.5 lb

## 2012-06-23 DIAGNOSIS — Z0000 Encounter for general adult medical examination without abnormal findings: Secondary | ICD-10-CM

## 2012-06-23 DIAGNOSIS — I10 Essential (primary) hypertension: Secondary | ICD-10-CM

## 2012-06-23 DIAGNOSIS — E785 Hyperlipidemia, unspecified: Secondary | ICD-10-CM

## 2012-06-23 DIAGNOSIS — N814 Uterovaginal prolapse, unspecified: Secondary | ICD-10-CM | POA: Insufficient documentation

## 2012-06-23 DIAGNOSIS — E039 Hypothyroidism, unspecified: Secondary | ICD-10-CM

## 2012-06-23 NOTE — Assessment & Plan Note (Signed)
Well controlled. Continue current medication.  

## 2012-06-23 NOTE — Progress Notes (Signed)
HPI  I have personally reviewed the Medicare Annual Wellness questionnaire and have noted  1. The patient's medical and social history  2. Their use of alcohol, tobacco or illicit drugs  3. Their current medications and supplements  4. The patient's functional ability including ADL's, fall risks, home safety risks and hearing or visual  impairment.  5. Diet and physical activities  6. Evidence for depression or mood disorders  The patients weight, height, BMI and visual acuity have been recorded in the chart  I have made referrals, counseling and provided education to the patient based review of the above and I have provided the pt with a written personalized care plan for preventive services.    She has sold her house.. Moved into townhouse.. nearer sister. More active. She is much happier.   CRF: followed by Dr. Tyrone Schimke, last appt 03/2012 reviewed.   Hypertension: Well controlled on current meds.  Using medication without problems or lightheadedness: None , no vision changes Chest pain with exertion:None  Edema:None  Short of breath:stable  Average home BPs:occ  Other issues:  Exercise: walking daily.  Diet: good  Elevated Cholesterol:  LDL almost at goal <70 on lipitor. Trig elevated. Lab Results  Component Value Date   CHOL 221* 06/14/2012   HDL 44.00 06/14/2012   LDLDIRECT 77.9 06/14/2012   TRIG 273.0* 06/14/2012   CHOLHDL 5 06/14/2012  Using medications without problems:None  Muscle aches: None  5 lb weight gain.   Wt Readings from Last 3 Encounters:  06/23/12 183 lb 8 oz (83.235 kg)  02/10/12 178 lb 6.4 oz (80.922 kg)  12/30/11 176 lb (79.833 kg)    Healthy eating. Walking daily at track.  CAD: Will now be followed by Dr. Mariah Milling.   Carotid dopplers 02/2012 critical stenosis: Followed by vascular. Holding on CEA given risks.  Asymptomatic.  Seeing  Dr. Unknown Foley for worsening pelvic organ prolapse.. Has pessary which is helping.  Possibly causing recurrent  UTI. Uncomfortable with prolapse but not pan.  Hypothyroid:  Lab Results  Component Value Date   TSH 5.08 06/14/2012    Review of Systems  Constitutional: Negative for fever, fatigue and unexpected weight change.  HENT: Negative for ear pain, congestion, sore throat, sneezing, trouble swallowing and sinus pressure.  Eyes: Negative for pain and itching.  Respiratory: Negative for cough, shortness of breath and wheezing.  Cardiovascular: Negative for chest pain, palpitations and leg swelling.  Gastrointestinal: Negative for nausea, abdominal pain, diarrhea, constipation and blood in stool.  Genitourinary: Negative for dysuria, hematuria, vaginal discharge, difficulty urinating and menstrual problem.  Skin: Negative for rash.  Neurological: Negative for syncope, weakness, light-headedness, numbness and headaches.  Psychiatric/Behavioral: Negative for confusion and dysphoric mood. The patient is not nervous/anxious.    Objective:   Physical Exam  Constitutional: Vital signs are normal. She appears well-developed and well-nourished. She is cooperative. Non-toxic appearance. She does not appear ill. No distress.  Elderly female  HENT:  Head: Normocephalic.  Right Ear: Hearing, tympanic membrane, external ear and ear canal normal.  Left Ear: Hearing, tympanic membrane, external ear and ear canal normal.  Nose: Nose normal.  Eyes: Conjunctivae, EOM and lids are normal. Pupils are equal, round, and reactive to light. No foreign bodies found.  Neck: Trachea normal and normal range of motion. Neck supple. Carotid bruit is not present. No mass and no thyromegaly present.  Cardiovascular: Normal rate, regular rhythm, S1 normal, S2 normal and intact distal pulses. Exam reveals no gallop.  Murmur heard.  Crescendo systolic murmur is present with a grade of 3/6  Greatest at RUSB  Pulmonary/Chest: Effort normal and breath sounds normal. No respiratory distress. She has no wheezes. She has no  rhonchi. She has no rales.  Abdominal: Soft. Normal appearance and bowel sounds are normal. She exhibits no distension, no fluid wave, no abdominal bruit and no mass. There is no hepatosplenomegaly. There is no tenderness. There is no rebound, no guarding and no CVA tenderness. No hernia.  Lymphadenopathy:  She has no cervical adenopathy.  She has no axillary adenopathy.  Neurological: She is alert. She has normal strength. No cranial nerve deficit or sensory deficit.  Skin: Skin is warm, dry and intact.  Psychiatric: She has a normal mood and affect. Her speech is normal and behavior is normal. Judgment and thought content normal. Her mood appears not anxious. Cognition and memory are normal. She does not exhibit a depressed mood.    Assessment & Plan:   AMW: The patient's preventative maintenance and recommended screening tests for an annual wellness exam were reviewed in full today.  Brought up to date unless services declined.  Counselled on the importance of diet, exercise, and its role in overall health and mortality.  The patient's FH and SH was reviewed, including their home life, tobacco status, and drug and alcohol status.   Vaccines: Flu uptodate . Uptodate with PNA, shingles. No indication for mammogram.  No indication for DVE/pap smear.  Colonoscopy: last in 2009.. No bowel issues.. Does not wish to repeat.  DXA: last 2010.Marland Kitchen Showed osteopenia in hip and spine... Refuses treatement so does not want to eval.  Taking CA, on no vit D per nephrologist.

## 2012-06-23 NOTE — Assessment & Plan Note (Signed)
Lab Results  Component Value Date   TSH 5.08 06/14/2012   Well controlled.

## 2012-06-23 NOTE — Patient Instructions (Addendum)
Continue to stay active. Work on healthy eating and weight loss.  Follow up in 6 months with fasting labs prior.

## 2012-06-23 NOTE — Assessment & Plan Note (Signed)
LDl almost at goal. Trig high. Encouraged exercise, weight loss, healthy eating habits.

## 2012-06-26 ENCOUNTER — Encounter: Payer: Self-pay | Admitting: Cardiovascular Disease

## 2012-06-26 ENCOUNTER — Ambulatory Visit (INDEPENDENT_AMBULATORY_CARE_PROVIDER_SITE_OTHER): Payer: Medicare Other | Admitting: Cardiovascular Disease

## 2012-06-26 ENCOUNTER — Other Ambulatory Visit: Payer: Self-pay

## 2012-06-26 VITALS — BP 112/72 | HR 66 | Ht 66.0 in | Wt 183.5 lb

## 2012-06-26 DIAGNOSIS — E785 Hyperlipidemia, unspecified: Secondary | ICD-10-CM

## 2012-06-26 DIAGNOSIS — I2510 Atherosclerotic heart disease of native coronary artery without angina pectoris: Secondary | ICD-10-CM

## 2012-06-26 DIAGNOSIS — I6529 Occlusion and stenosis of unspecified carotid artery: Secondary | ICD-10-CM

## 2012-06-26 MED ORDER — COLESEVELAM HCL 625 MG PO TABS: 1875.0000 mg | ORAL_TABLET | Freq: Two times a day (BID) | ORAL | Status: DC

## 2012-06-26 NOTE — Assessment & Plan Note (Signed)
She will see Dr. Darrick Penna in several months time with repeat ultrasound at that time. Increase in her diastolic velocities on last ultrasound. Possible candidate for stent.

## 2012-06-26 NOTE — Progress Notes (Signed)
Patient ID: [name], female    DOB: June 12, 1928, 78 y.o.   MRN: [id number]  HPI Comments: [name] is an 78 year old woman with history of coronary artery disease, status post non-ST-elevation myocardial infarction in January 2007, treated with bare-metal stenting to the obtuse marginal,  totally occluded right coronary with distal collateralization and signifcant LCX disease which is being treated medically as it is not easily amenable to intervention. She has normal LV function, Peripheral vascular disease  with previous abdominal aortic aneurysm repair and bilateral iliac stenting, carotid artery stenosis (critical on the right), renal artery stenosis with an atrophic left kidney and a moderate blockage on the right, hypertension and hyperlipidemia. Also with mild to moderate aortic stenosis.  She returns today for routine. She has no new complaints  She has recently sold her house and moved into a townhouse near family. She is very happy, active with no symptoms. She does report having pelvic prolapse. She is taking Lipitor 80 mg daily, has had recent weight gain and has been more "lazy". Cholesterol has increased greater than 200.   carotid ultrasound December 2012 showed progression of the stenosis on the right. Estimate is 80-99%.  She has seen Dr. Darrick Smith with repeat followup and ultrasound in 2014. Plan was made to watch her closely with possible stenting with any symptoms of TIA or stroke   Aortic ultrasound showed tortuous ectatic aorta biiliac bypass graft. Maximum dimensions 3.5 x 3.3 cm, dilated left common iliac artery 2 cm Recent echocardiogram EF 55-65% Mild to moderate AS   EKG shows sinus bradycardia with rate 64 beats per minute with no significant ST or T wave changes      Outpatient Encounter Prescriptions as of 06/26/2012  Medication Sig Dispense Refill  . aluminum-magnesium hydroxide-simethicone (MAALOX) 200-200-20 MG/5ML SUSP Take 30 mLs by mouth as needed.       Marland Kitchen aspirin 81 MG tablet Take 81 mg by mouth daily.        Marland Kitchen atorvastatin (LIPITOR) 80 MG tablet TAKE 1 TABLET DAILY  90 tablet  2  . Calcium Carbonate (CALTRATE 600) 1500 MG TABS Take 1 tablet by mouth daily.        . clopidogrel (PLAVIX) 75 MG tablet TAKE 1 TABLET DAILY  90 tablet  1  . estradiol (ESTRACE VAGINAL) 0.1 MG/GM vaginal cream Place 2 g vaginally 2 (two) times daily. Weekly      . Febuxostat (ULORIC) 80 MG TABS Take 80 mg by mouth daily.      . isosorbide mononitrate (IMDUR) 30 MG 24 hr tablet TAKE 2 TABLETS DAILY  180 tablet  1  . levothyroxine (SYNTHROID, LEVOTHROID) 112 MCG tablet TAKE 1 TABLET DAILY  90 tablet  2  . lisinopril (PRINIVIL,ZESTRIL) 10 MG tablet Take 1/2 tablet daily.      . metoprolol tartrate (LOPRESSOR) 25 MG tablet TAKE ONE-HALF (1/2) TABLET TWICE A DAY  90 tablet  2  . colesevelam (WELCHOL) 625 MG tablet Take 3 tablets (1,875 mg total) by mouth 2 (two) times daily with a meal.  180 tablet  6    Review of Systems  Constitutional: Negative.   HENT: Negative.   Eyes: Negative.   Respiratory: Negative.   Cardiovascular: Negative.   Gastrointestinal: Negative.        Pain periodically from ventral hernia  Musculoskeletal: Negative.   Skin: Negative.   Neurological: Negative.   Hematological: Negative.   Psychiatric/Behavioral: Negative.   All other systems reviewed and are negative.  BP 112/72  Pulse 66  Ht 5\' 6"  (1.676 m)  Wt 183 lb 8 oz (83.235 kg)  BMI 29.62 kg/m2  Physical Exam  Nursing note and vitals reviewed. Constitutional: She is oriented to person, place, and time. She appears well-developed and well-nourished.  HENT:  Head: Normocephalic.  Nose: Nose normal.  Mouth/Throat: Oropharynx is clear and moist.  Eyes: Conjunctivae normal are normal. Pupils are equal, round, and reactive to light.  Neck: Normal range of motion. Neck supple. No JVD present. Carotid bruit is present.  Cardiovascular: Normal rate, regular rhythm, S1 normal,  S2 normal and intact distal pulses.  Exam reveals no gallop and no friction rub.   Murmur heard.  Systolic murmur is present with a grade of 2/6  Pulmonary/Chest: Effort normal and breath sounds normal. No respiratory distress. She has no wheezes. She has no rales. She exhibits no tenderness.  Abdominal: Soft. Bowel sounds are normal. She exhibits no distension. There is no tenderness.  Musculoskeletal: Normal range of motion. She exhibits no edema and no tenderness.  Lymphadenopathy:    She has no cervical adenopathy.  Neurological: She is alert and oriented to person, place, and time. Coordination normal.  Skin: Skin is warm and dry. No rash noted. No erythema.  Psychiatric: She has a normal mood and affect. Her behavior is normal. Judgment and thought content normal.         Assessment and Plan

## 2012-06-26 NOTE — Assessment & Plan Note (Signed)
Cholesterol is well above goal. She is taking Lipitor 80 mg daily. We have suggested she start WelChol one pill twice a day titrating up to 3 tabs twice a day. I suggested she call since it is not cost effective for her. Goal total cholesterol less than 150. Additional options include adding zetia but she is reluctant to start a high co-pay medication.

## 2012-06-26 NOTE — Assessment & Plan Note (Signed)
Currently with no symptoms of angina. No further workup at this time. Continue current medication regimen. 

## 2012-06-26 NOTE — Patient Instructions (Addendum)
You are doing well. Please start welchol three times in the morning twice a day for cholesterol Continue atorvastatin  Ask gout doctor about allopurinol (instead of uloric), cheaper  Please call us if you have new issues that need to be addressed before your next appt.  Your physician wants you to follow-up in: 6 months.  You will receive a reminder letter in the mail two months in advance. If you don't receive a letter, please call our office to schedule the follow-up appointment.

## 2012-06-26 NOTE — Telephone Encounter (Signed)
New Rx for welchol.

## 2012-06-27 ENCOUNTER — Telehealth: Payer: Self-pay | Admitting: Cardiovascular Disease

## 2012-06-27 NOTE — Telephone Encounter (Signed)
Should be okay to take WelChol twice a day. Would just not take WelChol at the same time that she takes thyroid medication. Would make sure there is a gap of 1-2 hours between each medication then it should be just fine. WelChol is to prevent heart blockage as well as PAD. Very important we get her cholesterol down

## 2012-06-27 NOTE — Telephone Encounter (Signed)
Pt wants Dr. Mariah Milling to know that she picked up [facility] and was reading RX. She is concerned about taking 3 tabs BID, says this is "too many mg". I explained Dr. Mariah Milling wanted her to try 1 tablet BID first then titrate up to 3 tabs BID. She understands but also says she read warnings that mention welchol possibly interfering with her thyroid med. She wants [place] to know she is going to "hold off" on starting this med. I told her I would let Dr. Mariah Milling know.

## 2012-06-27 NOTE — Telephone Encounter (Signed)
Pt has questions concerning her new medication Welchol. Thinks that she is "putting to many mg into her body". She also states that the [facility] affects all her other meds.

## 2012-06-28 NOTE — Telephone Encounter (Signed)
Pt informed.  She verb. Understanding and is willing to try this

## 2012-07-14 ENCOUNTER — Other Ambulatory Visit (HOSPITAL_COMMUNITY): Payer: Self-pay | Admitting: Cardiovascular Disease

## 2012-07-14 ENCOUNTER — Other Ambulatory Visit: Payer: Self-pay | Admitting: *Deleted

## 2012-07-14 MED ORDER — ISOSORBIDE MONONITRATE ER 30 MG PO TB24: 30.0000 mg | ORAL_TABLET | Freq: Every day | ORAL | Status: DC

## 2012-07-14 NOTE — Telephone Encounter (Signed)
Refilled Isosorbide. 

## 2012-07-16 ENCOUNTER — Other Ambulatory Visit: Payer: Self-pay | Admitting: Family Medicine

## 2012-07-17 ENCOUNTER — Other Ambulatory Visit: Payer: Self-pay | Admitting: *Deleted

## 2012-07-17 MED ORDER — ATORVASTATIN CALCIUM 80 MG PO TABS: 80.0000 mg | ORAL_TABLET | Freq: Every day | ORAL | Status: DC

## 2012-07-17 NOTE — Telephone Encounter (Signed)
Refilled At[facility]vastatin [facility]

## 2012-07-24 ENCOUNTER — Telehealth: Payer: Self-pay | Admitting: *Deleted

## 2012-07-24 MED ORDER — ISOSORBIDE MONONITRATE ER 30 MG PO TB24: 30.0000 mg | ORAL_TABLET | Freq: Two times a day (BID) | ORAL | Status: DC

## 2012-07-24 NOTE — Telephone Encounter (Signed)
Pt states that she received her medication from mail order pharmacy today and her isosorbide was wrong. Pt states that she is taking 1 bid. New script says 1 daily. Last ov says 1 bid in med list?

## 2012-07-24 NOTE — Telephone Encounter (Signed)
New RX sent to pharmacy.

## 2012-08-10 ENCOUNTER — Other Ambulatory Visit: Payer: Self-pay | Admitting: Family Medicine

## 2012-08-16 ENCOUNTER — Encounter: Payer: Self-pay | Admitting: Vascular Surgery

## 2012-08-17 ENCOUNTER — Encounter: Payer: Self-pay | Admitting: Vascular Surgery

## 2012-08-17 ENCOUNTER — Ambulatory Visit (INDEPENDENT_AMBULATORY_CARE_PROVIDER_SITE_OTHER): Payer: Medicare Other | Admitting: Vascular Surgery

## 2012-08-17 ENCOUNTER — Other Ambulatory Visit (INDEPENDENT_AMBULATORY_CARE_PROVIDER_SITE_OTHER): Payer: Medicare Other | Admitting: *Deleted

## 2012-08-17 VITALS — BP 135/79 | HR 55 | Ht 66.0 in | Wt 184.5 lb

## 2012-08-17 DIAGNOSIS — L989 Disorder of the skin and subcutaneous tissue, unspecified: Secondary | ICD-10-CM

## 2012-08-17 DIAGNOSIS — I6529 Occlusion and stenosis of unspecified carotid artery: Secondary | ICD-10-CM

## 2012-08-17 NOTE — Progress Notes (Addendum)
Patient is an 79 year old female with a known high-grade right internal carotid artery stenosis. In the past she was thought to be high risk for right carotid endarterectomy due to coronary artery disease. She was not a candidate for carotid stenting due to to marginal renal function. She continues to have no symptoms of TIA amaurosis or stroke. She is on aspirin and Plavix. Other chronic medical problems include hyperlipidemia hypertension both of which are currently controlled.  Her cholesterol lowering agent was recently changed to WelChol.  Past Medical History  Diagnosis Date  . Aortic stenosis     Moderate; Echo 12/09. moderate AS mean 19. AVA 0.88  . PAD (peripheral artery disease)     s/p iliac stents  . HTN (hypertension)   . Hyperparathyroidism   . Hypothyroidism   . GERD (gastroesophageal reflux disease)   . Depression Dec 05, 2004    With death of spouse  . Ventral hernia     Moderate sized  . CAD (coronary artery disease)     s/p NSTEMI 1/07 with BMS to OM; cath 12/09 due to positive Myoview, LM ok. LAD 40%. LCX 80-90% mid. RCA chronically occluded with R --> L collats  . AAA (abdominal aortic aneurysm)     Recurrent; s/p repair, now 3.8 cam; h/o ischemic bowel 05-Dec-2004  . Carotid artery stenosis     R 60-79% L 0-39% (6/10) - stable  . Hyperlipidemia     Increased LFTs on Vytorin. Changed to Lipitor.  Marland Kitchen CRI (chronic renal insufficiency)     Mild, Cr 1.3  . Renal artery stenosis     Atrophic L kidney. Moderate blockage on R  . Basal cell carcinoma of nose 1980s  . Incontinence   . Full dentures   . Bruises easily   . Myocardial infarction 12-05-05   Review of systems: She denies shortness of breath. She denies chest pain.  Physical exam:  Filed Vitals:   08/17/12 1058 08/17/12 1103  BP: 149/78 135/79  Pulse: 55   Height: 5\' 6"  (1.676 m)   Weight: 184 lb 8 oz (83.689 kg)   SpO2: 97%    Skin: 2 cm nodule base of left neck which is subcutaneous in character nontender to  palpation non-erythematous Neck: No carotid bruit 2+ carotid pulses Extremities: 2+ brachial radial pulse right arm absent brachial radial pulse left arm Chest: Clear to auscultation bilaterally Cardiac: Regular rate and rhythm without murmur  Data: Patient had a carotid duplex exam today which I reviewed and interpreted. This shows a greater than 80% right internal carotid artery stenosis with a high bifurcation. These systolic velocities are slightly increased from her previous duplex in August of 2013. However, the end-diastolic velocity is similar  Assessment: High-grade right internal carotid artery stenosis, asymptomatic in a patient with multiple medical factors including severe coronary disease and renal insufficiency.  New nodule base left neck  Plan: We will continue to follow her clinically. She will have a repeat carotid duplex exam in one year. She will continue Plavix and aspirin. If she has any symptoms related to her carotid disease we would again reconsider at that point whether or not carotid endarterectomy her carotid stenting would be in her interest.   Since she has a history of prior multiple basal cell cancers as well as history of melanoma we will have a dermatology evaluation by Dr. Terri Piedra of her left neck nodule.  Fabienne Bruns, MD [facility] Office: [phone] Pager: [phone]

## 2012-08-18 NOTE — Addendum Note (Signed)
Addended by: Sharee Pimple on: 08/18/2012 01:37 PM   Modules accepted: Orders

## 2012-09-07 ENCOUNTER — Other Ambulatory Visit: Payer: Self-pay | Admitting: *Deleted

## 2012-09-07 MED ORDER — METOPROLOL TARTRATE 25 MG PO TABS: ORAL_TABLET | ORAL | Status: DC

## 2012-09-07 NOTE — Telephone Encounter (Signed)
[facility]

## 2012-09-26 ENCOUNTER — Other Ambulatory Visit: Payer: Self-pay | Admitting: Internal Medicine

## 2012-09-29 ENCOUNTER — Telehealth: Payer: Self-pay

## 2012-09-29 NOTE — Telephone Encounter (Signed)
lmtcb concerning welchol.

## 2012-09-29 NOTE — Telephone Encounter (Signed)
Please see below.

## 2012-09-29 NOTE — Telephone Encounter (Signed)
Will switch to colestipol It should be cheaper I hope Tabs, 4 g twice a day Max is 16 g per day Would start with 2 g daily for several days then 2 g twice a day, titrate up slowly

## 2012-09-29 NOTE — Telephone Encounter (Signed)
Pt is questioning if there is a alternaitve for [employer], she states she went to pick it up at the pharmacy and it was $80. Please call

## 2012-09-29 NOTE — Telephone Encounter (Signed)
Spoke with pt today she mentioned that she paid around $35 the first time she purchased welchol and this time that she went to pharmacy she said that they wanted her to pay $87.50 due to deductible. Pt mentioned that she can't afford medication but she will if she has to take it.  She is wondering if she needs to even continue medication or if there is alternate medication that she can take because she can't afford the medication at this time. Please advise.

## 2012-10-02 NOTE — Telephone Encounter (Signed)
Pt says she went ahead and picked up original rx Trying to figure out what is wrong with deductible

## 2012-12-14 ENCOUNTER — Telehealth: Payer: Self-pay | Admitting: Family Medicine

## 2012-12-14 DIAGNOSIS — E785 Hyperlipidemia, unspecified: Secondary | ICD-10-CM

## 2012-12-14 DIAGNOSIS — E039 Hypothyroidism, unspecified: Secondary | ICD-10-CM

## 2012-12-14 DIAGNOSIS — M810 Age-related osteoporosis without current pathological fracture: Secondary | ICD-10-CM

## 2012-12-14 DIAGNOSIS — M109 Gout, unspecified: Secondary | ICD-10-CM

## 2012-12-14 NOTE — Telephone Encounter (Signed)
Message copied by Excell Seltzer on Thu Dec 14, 2012 12:48 PM ------      Message from: Alvina Chou      Created: Wed Nov 29, 2012  4:54 PM      Regarding: Lab orders for Friday, 6.6.14       Labs for a 6 month f/u ------

## 2012-12-15 ENCOUNTER — Other Ambulatory Visit (INDEPENDENT_AMBULATORY_CARE_PROVIDER_SITE_OTHER): Payer: Medicare Other

## 2012-12-15 DIAGNOSIS — I10 Essential (primary) hypertension: Secondary | ICD-10-CM

## 2012-12-15 DIAGNOSIS — E039 Hypothyroidism, unspecified: Secondary | ICD-10-CM

## 2012-12-15 DIAGNOSIS — M810 Age-related osteoporosis without current pathological fracture: Secondary | ICD-10-CM

## 2012-12-15 DIAGNOSIS — E559 Vitamin D deficiency, unspecified: Secondary | ICD-10-CM

## 2012-12-15 DIAGNOSIS — E785 Hyperlipidemia, unspecified: Secondary | ICD-10-CM

## 2012-12-15 LAB — COMPREHENSIVE METABOLIC PANEL
ALT: 15 U/L (ref 0–35)
Alkaline Phosphatase: 55 U/L (ref 39–117)
Sodium: 143 mEq/L (ref 135–145)
Total Bilirubin: 0.7 mg/dL (ref 0.3–1.2)
Total Protein: 6.9 g/dL (ref 6.0–8.3)

## 2012-12-15 LAB — LIPID PANEL
HDL: 41.2 mg/dL (ref >39.00)
Total CHOL/HDL Ratio: 5
VLDL: 45.6 mg/dL — ABNORMAL HIGH (ref 0.0–40.0)

## 2012-12-15 LAB — LDL CHOLESTEROL, DIRECT: Direct LDL: 55.7 mg/dL

## 2012-12-22 ENCOUNTER — Ambulatory Visit: Payer: Medicare Other | Admitting: Family Medicine

## 2012-12-26 ENCOUNTER — Ambulatory Visit (INDEPENDENT_AMBULATORY_CARE_PROVIDER_SITE_OTHER): Payer: Medicare Other | Admitting: Family Medicine

## 2012-12-26 ENCOUNTER — Encounter: Payer: Self-pay | Admitting: Family Medicine

## 2012-12-26 VITALS — BP 120/72 | HR 61 | Temp 98.1°F | Ht 66.0 in | Wt 180.2 lb

## 2012-12-26 DIAGNOSIS — N259 Disorder resulting from impaired renal tubular function, unspecified: Secondary | ICD-10-CM

## 2012-12-26 DIAGNOSIS — E559 Vitamin D deficiency, unspecified: Secondary | ICD-10-CM | POA: Insufficient documentation

## 2012-12-26 DIAGNOSIS — I10 Essential (primary) hypertension: Secondary | ICD-10-CM

## 2012-12-26 DIAGNOSIS — E785 Hyperlipidemia, unspecified: Secondary | ICD-10-CM

## 2012-12-26 MED ORDER — ERGOCALCIFEROL 1.25 MG (50000 UT) PO CAPS: 50000.0000 [IU] | ORAL_CAPSULE | ORAL | Status: DC

## 2012-12-26 NOTE — Progress Notes (Signed)
Subjective:    Patient ID: [name], female    DOB: 01/04/1928, 79 y.o.   MRN: [id number]  HPI Very pleasant 79 year old female presents for 6 month follow up.  CRF: followed by Dr. Tyrone Schimke, last appt 2013 reviewed.   Hypertension: Well controlled on current meds.  Using medication without problems or lightheadedness: None , no vision changes  Chest pain with exertion:None  Edema:None  Short of breath:stable  Average home BPs:occ  Other issues:  Exercise: walking daily.  Diet: good   Elevated Cholesterol: LDL  at goal <70 on lipitor, better with addition of Welchol 1 twice daily. Trig elevated.  Lab Results  Component Value Date   CHOL 187 12/15/2012   HDL 41.20 12/15/2012   LDLDIRECT 55.7 12/15/2012   TRIG 228.0* 12/15/2012   CHOLHDL 5 12/15/2012  Using medications without problems:None  Muscle aches: None   Wt Readings from Last 3 Encounters:  12/26/12 180 lb 4 oz (81.761 kg)  08/17/12 184 lb 8 oz (83.689 kg)  06/26/12 183 lb 8 oz (83.235 kg)  Healthy eating. Walking when she can   CAD: 06/2012 now  followed by Dr. Mariah Milling.  Stable.  Carotid dopplers 08/2012 critical stenosis greater than 80% right internal carotid artery stenosis with a high bifurcation. These systolic velocities are slightly increased from her previous duplex in August of 2013. However, the end-diastolic velocity is similar  Followed by vascular. Holding on CEA given risks.  Asymptomatic.   Seeing Dr. Unknown Foley for worsening pelvic organ prolapse.. Has pessary which is helping.  Possibly causing recurrent UTI.  Uncomfortable with prolapse but not pain.      Review of Systems  Constitutional: Negative for fever and fatigue.  HENT: Negative for ear pain.   Eyes: Negative for pain.  Respiratory: Negative for chest tightness and shortness of breath.   Cardiovascular: Negative for chest pain, palpitations and leg swelling.  Gastrointestinal: Negative for abdominal pain.  Genitourinary: Negative for  dysuria.       Objective:   Physical Exam  Constitutional: Vital signs are normal. She appears well-developed and well-nourished. She is cooperative.  Non-toxic appearance. She does not appear ill. No distress.  Elderly female   HENT:  Head: Normocephalic.  Right Ear: Hearing, tympanic membrane, external ear and ear canal normal.  Left Ear: Hearing, tympanic membrane, external ear and ear canal normal.  Nose: Nose normal.  Eyes: Conjunctivae, EOM and lids are normal. Pupils are equal, round, and reactive to light. No foreign bodies found.  Neck: Trachea normal and normal range of motion. Neck supple. Carotid bruit is not present. No mass and no thyromegaly present.  Cardiovascular: Normal rate, regular rhythm, S1 normal, S2 normal and intact distal pulses.  Exam reveals no gallop.   Murmur heard.  Crescendo systolic murmur is present with a grade of 3/6  Greatest at RUSB  Pulmonary/Chest: Effort normal and breath sounds normal. No respiratory distress. She has no wheezes. She has no rhonchi. She has no rales.  Abdominal: Soft. Normal appearance and bowel sounds are normal. She exhibits no distension, no fluid wave, no abdominal bruit and no mass. There is no hepatosplenomegaly. There is no tenderness. There is no rebound, no guarding and no CVA tenderness. No hernia.  Lymphadenopathy:    She has no cervical adenopathy.    She has no axillary adenopathy.  Neurological: She is alert. She has normal strength. No cranial nerve deficit or sensory deficit.  Skin: Skin is warm, dry and intact. Rash  noted.  Dry flaky patches on legs  Psychiatric: She has a normal mood and affect. Her speech is normal and behavior is normal. Judgment and thought content normal. Her mood appears not anxious. Cognition and memory are normal. She does not exhibit a depressed mood.          Assessment & Plan:

## 2012-12-26 NOTE — Patient Instructions (Signed)
Start back on vit D supplement prescription. When completed start OTC vit D supplement. Keep working on healthy eating. Schedule medicare wellness in 6 months with labs prior.

## 2012-12-26 NOTE — Assessment & Plan Note (Signed)
Stable

## 2012-12-26 NOTE — Assessment & Plan Note (Signed)
Improved control now on welchol and lipitor. Total chol is not at goal per Dr. Mariah Milling <150... He may want to increase to 2 tabs BID, but she would prefer to wait until she sees him.Encouraged exercise, weight loss, healthy eating habits.

## 2012-12-26 NOTE — Assessment & Plan Note (Signed)
Well controlled. Continue current medication.  

## 2012-12-26 NOTE — Assessment & Plan Note (Signed)
Replace vit D. 

## 2013-01-03 ENCOUNTER — Encounter: Payer: Self-pay | Admitting: Cardiovascular Disease

## 2013-01-03 ENCOUNTER — Ambulatory Visit (INDEPENDENT_AMBULATORY_CARE_PROVIDER_SITE_OTHER): Payer: Medicare Other | Admitting: Cardiovascular Disease

## 2013-01-03 VITALS — BP 118/70 | HR 60 | Ht 66.0 in | Wt 182.8 lb

## 2013-01-03 DIAGNOSIS — I2510 Atherosclerotic heart disease of native coronary artery without angina pectoris: Secondary | ICD-10-CM

## 2013-01-03 DIAGNOSIS — I739 Peripheral vascular disease, unspecified: Secondary | ICD-10-CM

## 2013-01-03 DIAGNOSIS — I10 Essential (primary) hypertension: Secondary | ICD-10-CM

## 2013-01-03 DIAGNOSIS — E785 Hyperlipidemia, unspecified: Secondary | ICD-10-CM

## 2013-01-03 NOTE — Assessment & Plan Note (Signed)
Currently with no symptoms of angina. No further workup at this time. Continue current medication regimen. 

## 2013-01-03 NOTE — Patient Instructions (Addendum)
You are doing well. Please consider increasing the welchol to 2 pills twice a day  Please call us if you have new issues that need to be addressed before your next appt.  Your physician wants you to follow-up in: 6 months.  You will receive a reminder letter in the mail two months in advance. If you don't receive a letter, please call our office to schedule the follow-up appointment.

## 2013-01-03 NOTE — Assessment & Plan Note (Signed)
She will try WelChol 2 tabs twice a day in an effort to push her cholesterol closer to 150. Much improved from her peak of 360.

## 2013-01-03 NOTE — Assessment & Plan Note (Signed)
Blood pressure is well controlled on today's visit. No changes made to the medications. 

## 2013-01-03 NOTE — Assessment & Plan Note (Signed)
Followed by Dr. Darrick Penna. She reports having followup for her carotid early 2015

## 2013-01-03 NOTE — Progress Notes (Signed)
Patient ID: FLOYE [name], female    DOB: [birth year], 79 y.o.   MRN: [id number]  HPI Comments: [name] is an 79 year old woman with history of coronary artery disease, status post non-ST-elevation myocardial infarction in January 2007, treated with bare-metal stenting to the obtuse marginal,  totally occluded right coronary with distal collateralization and signifcant LCX disease which is being treated medically as it is not easily amenable to intervention. She has normal LV function, Peripheral vascular disease  with previous abdominal aortic aneurysm repair and bilateral iliac stenting, carotid artery stenosis (critical on the right) followed by Dr. Darrick Penna, renal artery stenosis with an atrophic left kidney and a moderate blockage on the right, hypertension and hyperlipidemia. Also with mild to moderate aortic valve  stenosis.  She returns today for routine. She has no new complaints  She is trying to get back into her walking but not currently doing regular exercise.  She tolerated WelChol one pill twice a day. Cholesterol dropped from 221 down to 187 . She is also started on vitamin D supplementation . Otherwise she feels well with no complaints .   carotid ultrasound showed progression of the stenosis on the right. Estimate is 80-99%.  She has seen Dr. Darrick Penna with repeat followup and ultrasound in 2014. Plan was made to watch her closely with possible stenting with any symptoms of TIA or stroke   Aortic ultrasound showed tortuous ectatic aorta biiliac bypass graft. Maximum dimensions 3.5 x 3.3 cm, dilated left common iliac artery 2 cm Recent echocardiogram EF 55-65% Mild to moderate AS   EKG shows  normal sinus rhythm with rate 60 beats per minute with no significant ST or T wave changes      Outpatient Encounter Prescriptions as of 01/03/2013  Medication Sig Dispense Refill  . aspirin 81 MG tablet Take 81 mg by mouth daily.        Marland Kitchen atorvastatin (LIPITOR) 80 MG tablet Take 1  tablet (80 mg total) by mouth daily.  90 tablet  3  . Calcium Carbonate (CALTRATE 600) 1500 MG TABS Take 1 tablet by mouth daily.        . clopidogrel (PLAVIX) 75 MG tablet TAKE 1 TABLET DAILY  90 tablet  0  . colesevelam (WELCHOL) 625 MG tablet Take 1,875 mg by mouth 2 (two) times daily with a meal. 1 tablet      . ergocalciferol (VITAMIN D2) 50000 UNITS capsule Take 1 capsule (50,000 Units total) by mouth once a week.  12 capsule  0  . estradiol (ESTRACE VAGINAL) 0.1 MG/GM vaginal cream Place 2 g vaginally 2 (two) times daily. Weekly      . Febuxostat (ULORIC) 80 MG TABS Take 80 mg by mouth daily.      . isosorbide mononitrate (IMDUR) 30 MG 24 hr tablet Take 30 mg by mouth daily. 2 tablets      . levothyroxine (SYNTHROID, LEVOTHROID) 112 MCG tablet TAKE 1 TABLET DAILY  90 tablet  1  . lisinopril (PRINIVIL,ZESTRIL) 10 MG tablet TAKE 1 TABLET DAILY  90 tablet  7  . metoprolol tartrate (LOPRESSOR) 25 MG tablet TAKE ONE-HALF (1/2) TABLET TWICE A DAY  90 tablet  3   No facility-administered encounter medications on file as of 01/03/2013.     Review of Systems  Constitutional: Negative.   HENT: Negative.   Eyes: Negative.   Respiratory: Negative.   Cardiovascular: Negative.   Gastrointestinal: Negative.        Pain periodically from ventral hernia  Musculoskeletal: Negative.   Skin: Negative.   Neurological: Negative.   Psychiatric/Behavioral: Negative.   All other systems reviewed and are negative.    BP 118/70  Pulse 60  Ht 5\' 6"  (1.676 m)  Wt 182 lb 12 oz (82.895 kg)  BMI 29.51 kg/m2  Physical Exam  Nursing note and vitals reviewed. Constitutional: She is oriented to person, place, and time. She appears well-developed and well-nourished.  HENT:  Head: Normocephalic.  Nose: Nose normal.  Mouth/Throat: Oropharynx is clear and moist.  Eyes: Conjunctivae are normal. Pupils are equal, round, and reactive to light.  Neck: Normal range of motion. Neck supple. No JVD present.  Carotid bruit is present.  Cardiovascular: Normal rate, regular rhythm, S1 normal, S2 normal and intact distal pulses.  Exam reveals no gallop and no friction rub.   Murmur heard.  Systolic murmur is present with a grade of 2/6  Pulmonary/Chest: Effort normal and breath sounds normal. No respiratory distress. She has no wheezes. She has no rales. She exhibits no tenderness.  Abdominal: Soft. Bowel sounds are normal. She exhibits no distension. There is no tenderness.  Musculoskeletal: Normal range of motion. She exhibits no edema and no tenderness.  Lymphadenopathy:    She has no cervical adenopathy.  Neurological: She is alert and oriented to person, place, and time. Coordination normal.  Skin: Skin is warm and dry. No rash noted. No erythema.  Psychiatric: She has a normal mood and affect. Her behavior is normal. Judgment and thought content normal.    Assessment and Plan

## 2013-01-04 ENCOUNTER — Ambulatory Visit: Payer: Medicare Other | Admitting: Cardiovascular Disease

## 2013-01-07 ENCOUNTER — Other Ambulatory Visit: Payer: Self-pay | Admitting: Family Medicine

## 2013-01-08 ENCOUNTER — Ambulatory Visit: Payer: Medicare Other | Admitting: Cardiovascular Disease

## 2013-01-30 ENCOUNTER — Other Ambulatory Visit: Payer: Self-pay

## 2013-01-30 MED ORDER — ISOSORBIDE MONONITRATE ER 30 MG PO TB24: 30.0000 mg | ORAL_TABLET | Freq: Every day | ORAL | Status: DC

## 2013-01-30 NOTE — Telephone Encounter (Signed)
Pt needs 3 months rx 2 per day

## 2013-01-30 NOTE — Telephone Encounter (Signed)
Refilled Isosorbide 90 day supply sent to [facility].

## 2013-01-31 ENCOUNTER — Other Ambulatory Visit: Payer: Self-pay | Admitting: *Deleted

## 2013-01-31 MED ORDER — ISOSORBIDE MONONITRATE ER 30 MG PO TB24: 30.0000 mg | ORAL_TABLET | Freq: Two times a day (BID) | ORAL | Status: DC

## 2013-01-31 NOTE — Telephone Encounter (Signed)
Refilled Isosorbide sent to [facility].

## 2013-02-02 ENCOUNTER — Telehealth: Payer: Self-pay

## 2013-02-02 MED ORDER — ISOSORBIDE MONONITRATE ER 30 MG PO TB24: 30.0000 mg | ORAL_TABLET | Freq: Two times a day (BID) | ORAL | Status: DC

## 2013-02-02 NOTE — Telephone Encounter (Signed)
[facility] on [facility]ide. Please call.

## 2013-02-02 NOTE — Telephone Encounter (Signed)
I called [facility]. Pt needed new prescription for Isosorbide. E-scribed. Mylo Red RN

## 2013-03-20 ENCOUNTER — Other Ambulatory Visit: Payer: Self-pay

## 2013-03-20 MED ORDER — COLESEVELAM HCL 625 MG PO TABS: 1875.0000 mg | ORAL_TABLET | ORAL | Status: DC

## 2013-03-20 MED ORDER — LEVOTHYROXINE SODIUM 112 MCG PO TABS: ORAL_TABLET | ORAL | Status: DC

## 2013-03-20 NOTE — Telephone Encounter (Signed)
Pt left v/m requesting refill on levothyroxine [facility] mcg [facility]. Left v/m refill done.

## 2013-03-20 NOTE — Telephone Encounter (Signed)
Pt would like 3 months supply

## 2013-03-20 NOTE — Telephone Encounter (Signed)
Pt called back and pt will cb to schedule check up in Dec.

## 2013-03-20 NOTE — Telephone Encounter (Signed)
Refilled Welchol #[id number] R#3 sent to [facility] pharmacy. Pt mentioned that she takes 2 tablets in the am and 1 tablet pm.

## 2013-03-30 ENCOUNTER — Other Ambulatory Visit: Payer: Self-pay | Admitting: Family Medicine

## 2013-04-12 ENCOUNTER — Encounter: Payer: Self-pay | Admitting: Family Medicine

## 2013-04-12 ENCOUNTER — Ambulatory Visit (INDEPENDENT_AMBULATORY_CARE_PROVIDER_SITE_OTHER): Payer: Medicare Other | Admitting: Family Medicine

## 2013-04-12 VITALS — BP 130/80 | HR 69 | Temp 98.3°F | Ht 66.0 in | Wt 179.0 lb

## 2013-04-12 DIAGNOSIS — J208 Acute bronchitis due to other specified organisms: Secondary | ICD-10-CM

## 2013-04-12 DIAGNOSIS — J209 Acute bronchitis, unspecified: Secondary | ICD-10-CM

## 2013-04-12 MED ORDER — BENZONATATE 200 MG PO CAPS: 200.0000 mg | ORAL_CAPSULE | Freq: Three times a day (TID) | ORAL | Status: DC | PRN

## 2013-04-12 NOTE — Progress Notes (Signed)
Nature conservation officer at [facility] [address] [place] [address] Phone: [phone] Fax: [phone]  Date:  04/12/2013   Name:  [name]   DOB:  1927-08-06   MRN:  [id number] Gender: female Age: 79 y.o.  Primary Physician:  Kerby Nora, MD  Evaluating MD: Hannah Beat, MD   Chief Complaint: Cough   History of Present Illness:  [name] is a 79 y.o. pleasant patient who presents with the following:  Cough and sick all week, mostly in her chest and feels really deep. Coughing her head off. Neighbor is a Engineer, civil (consulting).   vvery pleasant patient with multiple medical problems detailed below who presents with one week of coughing, production of mild sputum, with some minimal nasal congestion. No earache, sore throat, nausea, vomiting, or diarrhea. She is afebrile throughout. She is eating and drinking normally.  Patient Active Problem List   Diagnosis Date Noted  . Unspecified vitamin D deficiency 12/26/2012  . Uterine prolapse 06/23/2012  . Gout 06/06/2012  . INSOMNIA, CHRONIC, MILD 07/10/2010  . SENILE OSTEOPOROSIS 07/08/2009  . CAROTID ARTERY STENOSIS 11/01/2008  . AORTIC STENOSIS/ INSUFFICIENCY, NON-RHEUMATIC 09/25/2008  . ABDOMINAL AORTIC ANEURYSM 09/24/2008  . LOW BACK PAIN, MILD 07/24/2008  . CAD 06/21/2008  . VENTRAL HERNIA 05/16/2008  . PULMONARY NODULE, RIGHT MIDDLE LOBE 02/01/2008  . CYSTOCELE WITH INCOMPLETE UTERINE PROLAPSE 02/20/2007  . HYPOTHYROIDISM 02/17/2007  . HYPERLIPIDEMIA 02/17/2007  . HYPERTENSION 02/17/2007  . PERIPHERAL VASCULAR DISEASE 02/17/2007  . GERD 02/17/2007  . RENAL INSUFFICIENCY 02/17/2007  . ISCHEMIC COLITIS 05/19/2005    Past Medical History  Diagnosis Date  . Aortic stenosis     Moderate; Echo 12/09. moderate AS mean 19. AVA 0.88  . PAD (peripheral artery disease)     s/p iliac stents  . HTN (hypertension)   . Hyperparathyroidism   . Hypothyroidism   . GERD (gastroesophageal reflux disease)   .  Depression 11-Dec-2004    With death of spouse  . Ventral hernia     Moderate sized  . CAD (coronary artery disease)     s/p NSTEMI 1/07 with BMS to OM; cath 12/09 due to positive Myoview, LM ok. LAD 40%. LCX 80-90% mid. RCA chronically occluded with R --> L collats  . AAA (abdominal aortic aneurysm)     Recurrent; s/p repair, now 3.8 cam; h/o ischemic bowel 2004-12-11  . Carotid artery stenosis     R 60-79% L 0-39% (6/10) - stable  . Hyperlipidemia     Increased LFTs on Vytorin. Changed to Lipitor.  Marland Kitchen CRI (chronic renal insufficiency)     Mild, Cr 1.3  . Renal artery stenosis     Atrophic L kidney. Moderate blockage on R  . Basal cell carcinoma of nose 1980s  . Incontinence   . Full dentures   . Bruises easily   . Myocardial infarction 12-11-2005  . Vitamin D deficiency     Past Surgical History  Procedure Laterality Date  . Cholecystectomy  07/01/2005  . Breast biospy  1996  . Appendectomy  1954  . Tonsillectomy  1943  . Hernia repair  1997  . Coronary stent placement    . Breast surgery    . Abdominal aortic aneurysm repair  1995, 07/01/2005  . Angioplasty  12-12-2003 - 07/01/2005  . Iliac artery stent  2003-12-12 - 07/01/2005    Stent placement, both legs - PVD in arteries  . Colonoscopy  07/07/2005    Colonic erosions, ulcers proctitis  .  Upper gastrointestinal endoscopy  07/07/2005    Nml  . Nstemi  08/13/2005    1/07 s/p stent in circumflex  . Colonoscopy w/ endoscopic [place]  07/01/2005    [place] I kidney diffuse atrophy  . Cholecystectomy  1987  . Hosp  2005/05/14    ? abd. prob. mesenteric ischemia  . Coronary stent placement  07/2005    MI   . Hosp  01/2006    Hosp for reflux (chest pain)    History   Social History  . Marital Status: Widowed    Spouse Name: N/A    Number of Children: N/A  . Years of Education: N/A   Occupational History  . Not on file.   Social History Main Topics  . Smoking status: Former Smoker    Quit date: 07/12/2005  . Smokeless tobacco: Never Used      Comment: quit in 1997  . Alcohol Use: No  . Drug Use: No  . Sexual Activity: Not on file   Other Topics Concern  . Not on file   Social History Narrative   Husband deceased 05-14-2005 from multiple myeloma, lives alone near sister..    Has living will, HCPOA: daughter: [employer]   Full code. Has lifealert. (Reviewed 2013)          Family History  Problem Relation Age of Onset  . Heart attack Mother     MI  . Cancer Mother   . Heart disease Mother     heart attack  . Stroke Mother   . Cancer Father     throat  . Heart attack Father     MI  . Throat cancer Father   . Heart disease Father     heart attack   . Stroke Father   . Cancer Sister     breast  . Breast cancer Sister   . Hyperlipidemia Sister   . Colon cancer Neg Hx   . Hyperlipidemia Sister   . Hypertension Sister   . Hyperlipidemia Son     Allergies  Allergen Reactions  . Codeine     REACTION: Rash, N \\T \ V  . Fish Oil     REACTION: chest pains  . Morphine Sulfate     REACTION: Rash, N \\T \ V  . Naproxen     REACTION: N \\T \ V  . Sulfonamide Derivatives     REACTION: Rash, N \\T \ V  . Tramadol     Medication list has been reviewed and updated.  Outpatient Prescriptions Prior to Visit  Medication Sig Dispense Refill  . aspirin 81 MG tablet Take 81 mg by mouth daily.        Marland Kitchen atorvastatin (LIPITOR) 80 MG tablet TAKE 1 TABLET DAILY  90 tablet  1  . Calcium Carbonate (CALTRATE 600) 1500 MG TABS Take 1 tablet by mouth daily.        . clopidogrel (PLAVIX) 75 MG tablet TAKE 1 TABLET DAILY  90 tablet  1  . colesevelam (WELCHOL) 625 MG tablet Take 3 tablets (1,875 mg total) by mouth as directed.  270 tablet  3  . estradiol (ESTRACE VAGINAL) 0.1 MG/GM vaginal cream Place 2 g vaginally 2 (two) times daily. Weekly      . Febuxostat (ULORIC) 80 MG TABS Take 80 mg by mouth daily.      . isosorbide mononitrate (IMDUR) 30 MG 24 hr tablet Take 1 tablet (30 mg total) by mouth 2 (two) times daily.  180 tablet  3  .  levothyroxine (SYNTHROID, LEVOTHROID) 112 MCG tablet TAKE 1 TABLET DAILY  90 tablet  0  . lisinopril (PRINIVIL,ZESTRIL) 10 MG tablet TAKE 1 TABLET DAILY  90 tablet  7  . metoprolol tartrate (LOPRESSOR) 25 MG tablet TAKE ONE-HALF (1/2) TABLET TWICE A DAY  90 tablet  3  . ergocalciferol (VITAMIN D2) 50000 UNITS capsule Take 1 capsule (50,000 Units total) by mouth once a week.  12 capsule  0   No facility-administered medications prior to visit.    Review of Systems:  ROS: GEN: Acute illness details above GI: Tolerating PO intake GU: maintaining adequate hydration and urination Pulm: No SOB Interactive and getting along well at home.  Otherwise, ROS is as per the HPI.   Physical Examination: BP 130/80  Pulse 69  Temp(Src) 98.3 F (36.8 C) (Oral)  Ht 5\' 6"  (1.676 m)  Wt 179 lb (81.194 kg)  BMI 28.91 kg/m2  SpO2 96%  Ideal Body Weight: Weight in (lb) to have BMI = 25: 154.6   GEN: A and O x 3. WDWN. NAD.    ENT: Nose clear, ext NML.  No LAD.  No JVD.  TM's clear. Oropharynx clear.  PULM: Normal WOB, no distress. No crackles, wheezes, rhonchi. CV: RRR, no M/G/R, No rubs, No JVD.   EXT: warm and well-perfused, No c/c/e. PSYCH: Pleasant and conversant.  Assessment and Plan:  Viral bronchitis  Supportive care. Tessalon prn  Orders Today:  No orders of the defined types were placed in this encounter.    Updated Medication List: (Includes new medications, updates to list, dose adjustments) Meds ordered this encounter  Medications  . Cholecalciferol 1000 UNITS capsule    Sig: Take 3,000 Units by mouth daily.  . benzonatate (TESSALON) 200 MG capsule    Sig: Take 1 capsule (200 mg total) by mouth 3 (three) times daily as needed for cough.    Dispense:  40 capsule    Refill:  0    Medications Discontinued: Medications Discontinued During This Encounter  Medication Reason  . ergocalciferol (VITAMIN D2) 50000 UNITS capsule Completed Course      Signed, Yunior Jain T.  Medrith Veillon, MD 04/12/2013 12:28 PM

## 2013-04-16 ENCOUNTER — Emergency Department (HOSPITAL_COMMUNITY): Payer: Medicare Other

## 2013-04-16 ENCOUNTER — Telehealth: Payer: Self-pay | Admitting: Family Medicine

## 2013-04-16 ENCOUNTER — Emergency Department (HOSPITAL_COMMUNITY)
Admission: EM | Admit: 2013-04-16 | Discharge: 2013-04-16 | Disposition: A | Discharge status: Home / Self Care | Payer: Medicare Other | Attending: Emergency Medicine | Admitting: Emergency Medicine

## 2013-04-16 ENCOUNTER — Encounter (HOSPITAL_COMMUNITY): Payer: Self-pay | Admitting: Emergency Medicine

## 2013-04-16 DIAGNOSIS — K219 Gastro-esophageal reflux disease without esophagitis: Secondary | ICD-10-CM | POA: Insufficient documentation

## 2013-04-16 DIAGNOSIS — J209 Acute bronchitis, unspecified: Secondary | ICD-10-CM | POA: Diagnosis not present

## 2013-04-16 DIAGNOSIS — Z87891 Personal history of nicotine dependence: Secondary | ICD-10-CM | POA: Insufficient documentation

## 2013-04-16 DIAGNOSIS — I359 Nonrheumatic aortic valve disorder, unspecified: Secondary | ICD-10-CM | POA: Diagnosis not present

## 2013-04-16 DIAGNOSIS — F329 Major depressive disorder, single episode, unspecified: Secondary | ICD-10-CM | POA: Insufficient documentation

## 2013-04-16 DIAGNOSIS — Z7902 Long term (current) use of antithrombotics/antiplatelets: Secondary | ICD-10-CM | POA: Insufficient documentation

## 2013-04-16 DIAGNOSIS — I252 Old myocardial infarction: Secondary | ICD-10-CM | POA: Diagnosis not present

## 2013-04-16 DIAGNOSIS — Z79899 Other long term (current) drug therapy: Secondary | ICD-10-CM | POA: Insufficient documentation

## 2013-04-16 DIAGNOSIS — I714 Abdominal aortic aneurysm, without rupture, unspecified: Secondary | ICD-10-CM | POA: Insufficient documentation

## 2013-04-16 DIAGNOSIS — R32 Unspecified urinary incontinence: Secondary | ICD-10-CM | POA: Diagnosis not present

## 2013-04-16 DIAGNOSIS — Z882 Allergy status to sulfonamides status: Secondary | ICD-10-CM | POA: Diagnosis not present

## 2013-04-16 DIAGNOSIS — Z85828 Personal history of other malignant neoplasm of skin: Secondary | ICD-10-CM | POA: Insufficient documentation

## 2013-04-16 DIAGNOSIS — I10 Essential (primary) hypertension: Secondary | ICD-10-CM | POA: Diagnosis not present

## 2013-04-16 DIAGNOSIS — I6529 Occlusion and stenosis of unspecified carotid artery: Secondary | ICD-10-CM | POA: Diagnosis not present

## 2013-04-16 DIAGNOSIS — Z8719 Personal history of other diseases of the digestive system: Secondary | ICD-10-CM | POA: Diagnosis not present

## 2013-04-16 DIAGNOSIS — Z885 Allergy status to narcotic agent status: Secondary | ICD-10-CM | POA: Insufficient documentation

## 2013-04-16 DIAGNOSIS — Z98811 Dental restoration status: Secondary | ICD-10-CM | POA: Insufficient documentation

## 2013-04-16 DIAGNOSIS — E559 Vitamin D deficiency, unspecified: Secondary | ICD-10-CM | POA: Diagnosis not present

## 2013-04-16 DIAGNOSIS — I739 Peripheral vascular disease, unspecified: Secondary | ICD-10-CM | POA: Diagnosis not present

## 2013-04-16 DIAGNOSIS — E039 Hypothyroidism, unspecified: Secondary | ICD-10-CM | POA: Insufficient documentation

## 2013-04-16 DIAGNOSIS — N189 Chronic kidney disease, unspecified: Secondary | ICD-10-CM | POA: Insufficient documentation

## 2013-04-16 DIAGNOSIS — I2510 Atherosclerotic heart disease of native coronary artery without angina pectoris: Secondary | ICD-10-CM | POA: Diagnosis not present

## 2013-04-16 DIAGNOSIS — Z7982 Long term (current) use of aspirin: Secondary | ICD-10-CM | POA: Diagnosis not present

## 2013-04-16 DIAGNOSIS — R062 Wheezing: Secondary | ICD-10-CM | POA: Insufficient documentation

## 2013-04-16 DIAGNOSIS — Z888 Allergy status to other drugs, medicaments and biological substances status: Secondary | ICD-10-CM | POA: Diagnosis not present

## 2013-04-16 DIAGNOSIS — I701 Atherosclerosis of renal artery: Secondary | ICD-10-CM | POA: Insufficient documentation

## 2013-04-16 DIAGNOSIS — E785 Hyperlipidemia, unspecified: Secondary | ICD-10-CM | POA: Diagnosis not present

## 2013-04-16 DIAGNOSIS — J40 Bronchitis, not specified as acute or chronic: Secondary | ICD-10-CM

## 2013-04-16 DIAGNOSIS — F3289 Other specified depressive episodes: Secondary | ICD-10-CM | POA: Insufficient documentation

## 2013-04-16 DIAGNOSIS — R0602 Shortness of breath: Secondary | ICD-10-CM | POA: Diagnosis present

## 2013-04-16 LAB — POCT I-STAT TROPONIN I: Troponin i, poc: 0.02 ng/mL (ref 0.00–0.08)

## 2013-04-16 LAB — CBC
HCT: 41 % (ref 36.0–46.0)
Hemoglobin: 14.1 g/dL (ref 12.0–15.0)
MCH: 30.3 pg (ref 26.0–34.0)
Platelets: 173 10*3/uL (ref 150–400)
RBC: 4.66 MIL/uL (ref 3.87–5.11)
WBC: 9.8 10*3/uL (ref 4.0–10.5)

## 2013-04-16 LAB — BASIC METABOLIC PANEL
CO2: 24 mEq/L (ref 19–32)
Calcium: 9.6 mg/dL (ref 8.4–10.5)
Chloride: 106 mEq/L (ref 96–112)
Creatinine, Ser: 1.31 mg/dL — ABNORMAL HIGH (ref 0.50–1.10)
GFR calc Af Amer: 42 mL/min — ABNORMAL LOW (ref >90)
Glucose, Bld: 98 mg/dL (ref 70–99)
Sodium: 142 mEq/L (ref 135–145)

## 2013-04-16 LAB — PRO B NATRIURETIC PEPTIDE: Pro B Natriuretic peptide (BNP): 504.9 pg/mL — ABNORMAL HIGH (ref 0–450)

## 2013-04-16 MED ORDER — ALBUTEROL SULFATE HFA 108 (90 BASE) MCG/ACT IN AERS
2.0000 | INHALATION_SPRAY | Freq: Once | RESPIRATORY_TRACT | Status: AC
  Administered 2013-04-16: 2 via RESPIRATORY_TRACT
  Filled 2013-04-16: qty 6.7

## 2013-04-16 MED ORDER — IPRATROPIUM BROMIDE 0.02 % IN SOLN
0.5000 mg | RESPIRATORY_TRACT | Status: DC
  Administered 2013-04-16: 0.5 mg via RESPIRATORY_TRACT
  Filled 2013-04-16: qty 2.5

## 2013-04-16 MED ORDER — ALBUTEROL SULFATE (5 MG/ML) 0.5% IN NEBU
2.5000 mg | INHALATION_SOLUTION | RESPIRATORY_TRACT | Status: DC
  Administered 2013-04-16: 2.5 mg via RESPIRATORY_TRACT
  Filled 2013-04-16: qty 0.5

## 2013-04-16 MED ORDER — AZITHROMYCIN 250 MG PO TABS: ORAL_TABLET | ORAL | Status: DC

## 2013-04-16 MED ORDER — FUROSEMIDE 10 MG/ML IJ SOLN: 40.0000 mg | Freq: Once | INTRAMUSCULAR | Status: DC

## 2013-04-16 NOTE — ED Notes (Signed)
Pt. Given inhaler to go home with. Instructed on how to use and return demonstration by patient.

## 2013-04-16 NOTE — ED Notes (Signed)
Pt c/o cough with SOB x 1 week; pt sts productive cough with clear sputum; pt sts only SOB with cough; pt denies fever

## 2013-04-16 NOTE — ED Provider Notes (Signed)
CSN: [id number]     Arrival date & time 04/16/13  1646 History   First MD Initiated Contact with Patient 04/16/13 Dec 18, 2019     Chief Complaint  Patient presents with  . Shortness of Breath  . Cough   (Consider location/radiation/quality/duration/timing/severity/associated sxs/prior Treatment) HPI Comments: Seen by PCP 4 days ago - given tessalon at that time.   Patient is a 79 y.o. female presenting with shortness of breath and cough. The history is provided by the patient.  Shortness of Breath Severity:  Mild Onset quality:  Gradual Duration:  7 days Timing:  Intermittent Progression:  Worsening Chronicity:  New Context comment:  Severe cough for past week Relieved by: some relief with tessalon from PCP. Worsened by:  Nothing tried Associated symptoms: cough   Associated symptoms: no abdominal pain, no chest pain, no fever, no rash and no wheezing   Cough:    Cough characteristics:  Non-productive   Sputum characteristics:  Clear   Severity:  Mild   Duration:  1 week   Timing:  Constant   Progression:  Worsening   Chronicity:  New Cough Associated symptoms: shortness of breath   Associated symptoms: no chest pain, no fever, no rash and no wheezing     Past Medical History  Diagnosis Date  . Aortic stenosis     Moderate; Echo 12/09. moderate AS mean 19. AVA 0.88  . PAD (peripheral artery disease)     s/p iliac stents  . HTN (hypertension)   . Hyperparathyroidism   . Hypothyroidism   . GERD (gastroesophageal reflux disease)   . Depression December 17, 2004    With death of spouse  . Ventral hernia     Moderate sized  . CAD (coronary artery disease)     s/p NSTEMI 1/07 with BMS to OM; cath 12/09 due to positive Myoview, LM ok. LAD 40%. LCX 80-90% mid. RCA chronically occluded with R --> L collats  . AAA (abdominal aortic aneurysm)     Recurrent; s/p repair, now 3.8 cam; h/o ischemic bowel Dec 17, 2004  . Carotid artery stenosis     R 60-79% L 0-39% (6/10) - stable  . Hyperlipidemia      Increased LFTs on Vytorin. Changed to Lipitor.  Marland Kitchen CRI (chronic renal insufficiency)     Mild, Cr 1.3  . Renal artery stenosis     Atrophic L kidney. Moderate blockage on R  . Basal cell carcinoma of nose 1980s  . Incontinence   . Full dentures   . Bruises easily   . Myocardial infarction December 17, 2005  . Vitamin D deficiency    Past Surgical History  Procedure Laterality Date  . Cholecystectomy  07/01/2005  . Breast biospy  1996  . Appendectomy  1954  . Tonsillectomy  1943  . Hernia repair  1997  . Coronary stent placement    . Breast surgery    . Abdominal aortic aneurysm repair  1995, 07/01/2005  . Angioplasty  December 18, 2003 - 07/01/2005  . Iliac artery stent  December 18, 2003 - 07/01/2005    Stent placement, both legs - PVD in arteries  . Colonoscopy  07/07/2005    Colonic erosions, ulcers proctitis  . Upper gastrointestinal endoscopy  07/07/2005    Nml  . Nstemi  08/13/2005    1/07 s/p stent in circumflex  . Colonoscopy w/ endoscopic [place]  07/01/2005    [place] I kidney diffuse atrophy  . Cholecystectomy  1987  . Hosp  04/2005    ? abd. prob. mesenteric ischemia  .  Coronary stent placement  07/2005    MI   . Hosp  01/2006    Hosp for reflux (chest pain)   Family History  Problem Relation Age of Onset  . Heart attack Mother     MI  . Cancer Mother   . Heart disease Mother     heart attack  . Stroke Mother   . Cancer Father     throat  . Heart attack Father     MI  . Throat cancer Father   . Heart disease Father     heart attack   . Stroke Father   . Cancer Sister     breast  . Breast cancer Sister   . Hyperlipidemia Sister   . Colon cancer Neg Hx   . Hyperlipidemia Sister   . Hypertension Sister   . Hyperlipidemia Son    History  Substance Use Topics  . Smoking status: Former Smoker    Quit date: 07/12/2005  . Smokeless tobacco: Never Used     Comment: quit in 1997  . Alcohol Use: No   OB History   Grav Para Term Preterm Abortions TAB SAB Ect Mult Living                  Review of Systems  Constitutional: Negative for fever.  Respiratory: Positive for cough and shortness of breath. Negative for wheezing.   Cardiovascular: Negative for chest pain.  Gastrointestinal: Negative for abdominal pain.  Skin: Negative for rash.  All other systems reviewed and are negative.    Allergies  Codeine; Fish oil; Morphine sulfate; Naproxen; Sulfonamide derivatives; and Tramadol  Home Medications   Current Outpatient Rx  Name  Route  Sig  Dispense  Refill  . aspirin 81 MG tablet   Oral   Take 81 mg by mouth daily.           Marland Kitchen atorvastatin (LIPITOR) 80 MG tablet      TAKE 1 TABLET DAILY   90 tablet   1   . benzonatate (TESSALON) 200 MG capsule   Oral   Take 1 capsule (200 mg total) by mouth 3 (three) times daily as needed for cough.   40 capsule   0   . Calcium Carbonate (CALTRATE 600) 1500 MG TABS   Oral   Take 1 tablet by mouth daily.           . Cholecalciferol 1000 UNITS capsule   Oral   Take 3,000 Units by mouth daily.         . clopidogrel (PLAVIX) 75 MG tablet      TAKE 1 TABLET DAILY   90 tablet   1   . colesevelam (WELCHOL) 625 MG tablet   Oral   Take 3 tablets (1,875 mg total) by mouth as directed.   270 tablet   3     Takes 2 tablets am and 1 tablet pm daily.   Marland Kitchen estradiol (ESTRACE VAGINAL) 0.1 MG/GM vaginal cream   Vaginal   Place 2 g vaginally 2 (two) times daily. Weekly         . Febuxostat (ULORIC) 80 MG TABS   Oral   Take 80 mg by mouth daily.         . isosorbide mononitrate (IMDUR) 30 MG 24 hr tablet   Oral   Take 1 tablet (30 mg total) by mouth 2 (two) times daily.   180 tablet   3   . levothyroxine (SYNTHROID, LEVOTHROID) 112  MCG tablet      TAKE 1 TABLET DAILY   90 tablet   0   . lisinopril (PRINIVIL,ZESTRIL) 10 MG tablet      TAKE 1 TABLET DAILY   90 tablet   7   . metoprolol tartrate (LOPRESSOR) 25 MG tablet      TAKE ONE-HALF (1/2) TABLET TWICE A DAY   90 tablet   3    BP  136/91  Pulse 73  Temp(Src) 97.9 F (36.6 C) (Oral)  Resp 18  SpO2 98% Physical Exam  Nursing note and vitals reviewed. Constitutional: She is oriented to person, place, and time. She appears well-developed and well-nourished. No distress.  HENT:  Head: Normocephalic and atraumatic.  Eyes: EOM are normal. Pupils are equal, round, and reactive to light.  Neck: Normal range of motion. Neck supple.  Cardiovascular: Normal rate and regular rhythm.  Exam reveals no friction rub.   No murmur heard. Pulmonary/Chest: Effort normal. No respiratory distress. She has wheezes (very mild, L middle lobe). She has no rales.  Abdominal: Soft. She exhibits no distension. There is no tenderness. There is no rebound.  Musculoskeletal: Normal range of motion. She exhibits no edema.  Neurological: She is alert and oriented to person, place, and time.  Skin: She is not diaphoretic.    ED Course  Procedures (including critical care time) Labs Review Labs Reviewed  CBC  BASIC METABOLIC PANEL  PRO B NATRIURETIC PEPTIDE   Imaging Review Dg Chest 2 View  04/16/2013   *RADIOLOGY REPORT*  Clinical Data: Initial encounter for current episode of shortness of breath and cough for approximately 1 week, along with intermittent chest pain.  Former smoker who quit approximately 8 years ago.  Prior history of MI in January, 2007.  CHEST - 2 VIEW  Comparison: Portable chest x-ray 01/03/2009.  Two-view chest x-ray 01/25/2006, 07/26/2005.  CT chest 08/23/2011.  Findings: Cardiac silhouette normal in size, unchanged.  Aortic annular calcification.  Thoracic aorta tortuous, atherosclerotic, and ectatic, unchanged.  Hilar and mediastinal contours otherwise unremarkable.  Lungs hyperinflated with emphysematous changes in the upper lobes, unchanged.  Moderate central peribronchial thickening, unchanged.  Lungs otherwise clear.  No pleural effusions.  No pneumothorax.  Pulmonary vascularity normal.  Mild degenerative changes  involving the thoracic spine.  No significant interval change.  IMPRESSION: COPD/emphysema.  No acute cardiopulmonary disease.  Stable examination.   Original Report Authenticated By: Hulan Saas, M.D.    MDM   1. Bronchitis    31F with hx of CAD, smoking without definite diagnosis of COPD, presents with cough. Seen by PCP recently last week, given tessalon to help with cough. They called PCP today were given recommendations to come here. Denies fevers, chest pain. States weakness with this cough. No hemoptysis. Unable to get mucus up she feels in her chest.  Here, AFVSS, no respiratory distress. Satting 99% on room air, normal respiratory rate. Sinus rhythm with rate in the 60s on my exam. Lungs clear except for one mild wheeze in LML. Hx of 60 pack years of smoking, possible component of COPD. CXR negative. Will give breathing treatment and albuterol inhaler to go home here. Will check basic labs with her extensive cardiac history, will likely be normal as patient appears very well.  If labs ok, will discharge with azithromycin.  Labs show mild elevation in BNP at 505. I spoke with the moonlighter, Dr. Terressa Koyanagi, for [facility] who stated patient is safe for discharge. I agree with this plan. She  is on beta blockers, ACEIs, and does not need any new medication recommendations. Instructed to f/u with her Cardiologist ASAP. Given Azithro for possible bronchitis.     Dagmar Hait, MD 04/17/13 0040

## 2013-04-16 NOTE — Telephone Encounter (Signed)
Patient Information:  Caller Name: Purnell Shoemaker  Phone: [phone]  Patient: [name], [name]  Gender: Female  DOB: [birth year]  Age: 79 Years  PCP: Kerby Nora [facility])  Office Follow Up:  Does the office need to follow up with this patient?: Yes  Instructions For The Office: Sent to ER for Care  RN Note:  Advised ER for evaluation. No appt available in office. Protocol to ED.  Understanding expressed. [place] advised  Symptoms  Reason For Call & Symptoms: Daughter states her mother came to the office on Thursday 04/12/13 and saw Dr. Dallas Schimke and was diagnosed with Viral Bronchitis and given Tessalon Pear[place].  Daughter states she is constantly coughing and is not better. She now describing moderate shortness of breath and difficulty breathing. sweating and clammy  Reviewed Health History In EMR: N/A  Reviewed Medications In EMR: N/A  Reviewed Allergies In EMR: N/A  Reviewed Surgeries / Procedures: N/A  Date of Onset of Symptoms: 04/12/2013  Treatments Tried: Tesselon Pearls  Treatments Tried Worked: No  Guideline(s) Used:  Breathing Difficulty  Disposition Per Guideline:   Go to ED Now  Reason For Disposition Reached:   Moderate difficulty breathing (e.g., speaks in phrases, SOB even at rest, pulse 100-120) of new onset or worse than normal  Advice Given:  General Care Advice for Breathing Difficulty:  Find position of greatest comfort. For most patients the best position is semi-upright (e.g., sitting up in a comfortable chair or lying back against pillows).  Elevate head of bed (e.g., use pillows or place blocks under bed).  Keep room temperature slightly on the cool side.  Call Back If:  Severe difficulty breathing occurs  You become worse.  RN Overrode Recommendation:  Go To ED  Patient sent to [facility] ED

## 2013-04-16 NOTE — ED Notes (Signed)
Lasix held per Dr. Gwendolyn Grant

## 2013-04-17 NOTE — Telephone Encounter (Signed)
Called pt to discuss. She states worsening shortness of breath from congestion. No chest pain. Seen at ER yesterday... Dx bronchitis. Started on z pack She reports she feels better today.  BNP was elevated.. Recommended [place] of heart.  She has appt with Dr. Mariah Milling 10 AM 10/8.

## 2013-04-18 ENCOUNTER — Encounter: Payer: Self-pay | Admitting: Cardiovascular Disease

## 2013-04-18 ENCOUNTER — Ambulatory Visit (INDEPENDENT_AMBULATORY_CARE_PROVIDER_SITE_OTHER): Payer: Medicare Other | Admitting: Cardiovascular Disease

## 2013-04-18 VITALS — BP 122/82 | HR 68 | Ht 67.0 in | Wt 175.5 lb

## 2013-04-18 DIAGNOSIS — R0789 Other chest pain: Secondary | ICD-10-CM

## 2013-04-18 DIAGNOSIS — J40 Bronchitis, not specified as acute or chronic: Secondary | ICD-10-CM

## 2013-04-18 DIAGNOSIS — E785 Hyperlipidemia, unspecified: Secondary | ICD-10-CM

## 2013-04-18 DIAGNOSIS — I359 Nonrheumatic aortic valve disorder, unspecified: Secondary | ICD-10-CM

## 2013-04-18 DIAGNOSIS — R0602 Shortness of breath: Secondary | ICD-10-CM

## 2013-04-18 DIAGNOSIS — I10 Essential (primary) hypertension: Secondary | ICD-10-CM

## 2013-04-18 DIAGNOSIS — I739 Peripheral vascular disease, unspecified: Secondary | ICD-10-CM

## 2013-04-18 MED ORDER — ALBUTEROL SULFATE HFA 108 (90 BASE) MCG/ACT IN AERS: 2.0000 | INHALATION_SPRAY | RESPIRATORY_TRACT | Status: DC | PRN

## 2013-04-18 MED ORDER — PREDNISONE 20 MG PO TABS: 20.0000 mg | ORAL_TABLET | Freq: Every day | ORAL | Status: DC

## 2013-04-18 NOTE — Progress Notes (Signed)
Patient ID: [name], female    DOB: 08-May-1928, 79 y.o.   MRN: [id number]  HPI Comments: Ms. Wainright is an 79 year old woman with history of coronary artery disease, status post non-ST-elevation myocardial infarction in January 2007, treated with bare-metal stenting to the obtuse marginal,  totally occluded right coronary with distal collateralization and signifcant LCX disease which is being treated medically as it is not easily amenable to intervention. She has normal LV function, Peripheral vascular disease  with previous abdominal aortic aneurysm repair and bilateral iliac stenting, carotid artery stenosis (critical on the right) followed by Dr. Darrick Penna, renal artery stenosis with an atrophic left kidney and a moderate blockage on the right, hypertension and hyperlipidemia. Also with mild to moderate aortic valve  Stenosis in 2012.  She stopped smoking approximately 8 years ago, smoked for 20-30 years but the exact number of years is vague  She returns today for routine. Overall she does not feel well. She had a flu shot September 26. Since then she has had a significant cough that has been progressive, productive. She went to the emergency room and was started on azithromycin in the past several days. Cough is getting worse, now with wheezing. She has difficulty sleeping. She denies any lower extremity edema, abdominal swelling, or shortness of breath with exertion. In the emergency room she was told that her BNP was elevated. She tolerated WelChol one pill twice a day. Cholesterol dropped from 221 down to 187 . She is also started on vitamin D supplementation . Otherwise she feels well with no complaints .   carotid ultrasound showed progression of the stenosis on the right. Estimate is 80-99%.  She has seen Dr. Darrick Penna with repeat followup and ultrasound in 2014. Plan was made to watch her closely with possible stenting with any symptoms of TIA or stroke   Aortic ultrasound showed tortuous  ectatic aorta biiliac bypass graft. Maximum dimensions 3.5 x 3.3 cm, dilated left common iliac artery 2 cm Recent echocardiogram EF 55-65% Mild to moderate AS   EKG shows  normal sinus rhythm with rate 68 beats per minute with no significant ST or T wave changes      Outpatient Encounter Prescriptions as of 04/18/2013  Medication Sig Dispense Refill  . acetaminophen (TYLENOL) 500 MG tablet Take 1,000 mg by mouth 2 (two) times daily as needed for pain.      Marland Kitchen albuterol (PROVENTIL HFA;VENTOLIN HFA) 108 (90 BASE) MCG/ACT inhaler Inhale 2 puffs into the lungs as needed for wheezing.      Marland Kitchen aspirin EC 81 MG tablet Take 81 mg by mouth at bedtime.      Marland Kitchen atorvastatin (LIPITOR) 80 MG tablet Take 80 mg by mouth daily after supper.      Marland Kitchen azithromycin (ZITHROMAX Z-PAK) 250 MG tablet 2 po day one, then 1 daily x 4 days  6 tablet  0  . benzonatate (TESSALON) 200 MG capsule Take 1 capsule (200 mg total) by mouth 3 (three) times daily as needed for cough.  40 capsule  0  . Calcium Carbonate (CALTRATE 600) 1500 MG TABS Take 1 tablet by mouth daily.       . Cholecalciferol 1000 UNITS capsule Take 3,000 Units by mouth daily.      . clopidogrel (PLAVIX) 75 MG tablet Take 75 mg by mouth daily.      . colesevelam (WELCHOL) 625 MG tablet Take 625-1,250 mg by mouth 2 (two) times daily with a meal. 2 tablets (1250 mg)  at lunch, 1 tablet (625 mg) at bedtime      . estradiol (ESTRACE VAGINAL) 0.1 MG/GM vaginal cream Place 2 g vaginally 2 (two) times a week. Thursday and Sunday      . Febuxostat (ULORIC) 80 MG TABS Take 80 mg by mouth daily.      . isosorbide mononitrate (IMDUR) 30 MG 24 hr tablet Take 1 tablet (30 mg total) by mouth 2 (two) times daily.  180 tablet  3  . levothyroxine (SYNTHROID, LEVOTHROID) 112 MCG tablet Take 112 mcg by mouth daily before breakfast.      . lisinopril (PRINIVIL,ZESTRIL) 10 MG tablet Take 10 mg by mouth daily.      . metoprolol tartrate (LOPRESSOR) 25 MG tablet Take 12.5 mg by mouth  2 (two) times daily.       No facility-administered encounter medications on file as of 04/18/2013.     Review of Systems  Constitutional: Negative.   HENT: Negative.   Eyes: Negative.   Respiratory: Positive for cough, shortness of breath and wheezing.   Cardiovascular: Negative.   Gastrointestinal: Negative.   Musculoskeletal: Negative.   Skin: Negative.   Neurological: Negative.   Psychiatric/Behavioral: Negative.   All other systems reviewed and are negative.    BP 122/82  Pulse 68  Ht 5\' 7"  (1.702 m)  Wt 175 lb 8 oz (79.606 kg)  BMI 27.48 kg/m2  Physical Exam  Nursing note and vitals reviewed. Constitutional: She is oriented to person, place, and time. She appears well-developed and well-nourished.  HENT:  Head: Normocephalic.  Nose: Nose normal.  Mouth/Throat: Oropharynx is clear and moist.  Eyes: Conjunctivae are normal. Pupils are equal, round, and reactive to light.  Neck: Normal range of motion. Neck supple. No JVD present. Carotid bruit is present.  Cardiovascular: Normal rate, regular rhythm, S1 normal, S2 normal and intact distal pulses.  Exam reveals no gallop and no friction rub.   Murmur heard.  Systolic murmur is present with a grade of 2/6  Pulmonary/Chest: Effort normal and breath sounds normal. No respiratory distress. She has no wheezes. She has no rales. She exhibits no tenderness.  Abdominal: Soft. Bowel sounds are normal. She exhibits no distension. There is no tenderness.  Musculoskeletal: Normal range of motion. She exhibits no edema and no tenderness.  Lymphadenopathy:    She has no cervical adenopathy.  Neurological: She is alert and oriented to person, place, and time. Coordination normal.  Skin: Skin is warm and dry. No rash noted. No erythema.  Psychiatric: She has a normal mood and affect. Her behavior is normal. Judgment and thought content normal.    Assessment and Plan

## 2013-04-18 NOTE — Assessment & Plan Note (Signed)
Cholesterol is elevated. We'll discuss this with her on her next visit 

## 2013-04-18 NOTE — Assessment & Plan Note (Signed)
Mild to moderate aortic valve stenosis and 2012. Consider repeat echocardiogram next year   

## 2013-04-18 NOTE — Patient Instructions (Addendum)
Please start prednisone today Three pills daily for three days Then 2 pills daily for three days Then 1 pill daily for three days Then 1/2 pill daily until you run out of medication  Continue on the antibiotic Robitussin and mucinex may help the cough  Please call us if you have new issues that need to be addressed before your next appt.  Your physician wants you to follow-up in: 6 months.  You will receive a reminder letter in the mail two months in advance. If you don't receive a letter, please call our office to schedule the follow-up appointment.

## 2013-04-18 NOTE — Assessment & Plan Note (Signed)
She reports continued symptoms of cough, shortness of breath and wheezing despite being on azithromycin for several days. As she is miserable and requesting assistance, we'll start her on a prednisone taper, encouraged her to use her albuterol inhaler several times a day and finish her antibiotics. She can use Robitussin for cough. Suggested she call the office if symptoms do not improve in the next several days

## 2013-04-18 NOTE — Assessment & Plan Note (Addendum)
PAD  likely from long history of smoking, hyperlipidemia. We'll continue to work on her cholesterol. Goal LDL less than 70.

## 2013-04-18 NOTE — Assessment & Plan Note (Signed)
Blood pressure is well controlled on today's visit. No changes made to the medications. 

## 2013-05-26 ENCOUNTER — Other Ambulatory Visit: Payer: Self-pay | Admitting: Family Medicine

## 2013-06-11 ENCOUNTER — Telehealth: Payer: Self-pay

## 2013-06-11 NOTE — Telephone Encounter (Signed)
Pt daughter, States heavy coughing, but nothing coming up, lethargic, started Saturday. States Dr Mariah Milling tx her before for bronchitis, told pt if she had these symptoms again to call him.

## 2013-06-11 NOTE — Telephone Encounter (Signed)
Pt's daughter called back wanting to let us know that Dr. Ermalene Searing cannot see her today, but has appt at 9:30. Daughter does not feel comfortable waiting that long, as pt is lethargic and wearing herself out coughing. States that the last time pt coughed this much, she was told that it was putting too much strain on her heart.  Daughter is taking pt to a walk-in clinic and wanted to make [place] aware.

## 2013-06-11 NOTE — Telephone Encounter (Signed)
Spoke w/ Purnell Shoemaker, pt's daughter, who reports that pt was given z-pack and inhalers on her last visit. W/in 24 hrs of starting these, pt felt better, but symptoms returned on Saturday with a nonproductive cough. States that pt is lethargic, trying to cough, but "cant' get anything up." Reports that pt's sister was recently hospitalized and pt "sat in that cold room with her" and has felt bad since. States that Dr. Mariah Milling instructed her to call our office instead of PCP if sx did not resolve. Daughter would like to know if something can be called in. Please advise.  Thank you!

## 2013-06-11 NOTE — Telephone Encounter (Signed)
Spoke w/ pt's daughter.  Informed her that, per Dr. Mariah Milling, pt should contact her PCP, as since her last ov was almost 2 mos ago, sx could be from new/different infection. She is agreeable to this and will contact Dr. Daphine Deutscher office.

## 2013-06-12 ENCOUNTER — Ambulatory Visit: Payer: Medicare Other | Admitting: Family Medicine

## 2013-06-19 ENCOUNTER — Telehealth: Payer: Self-pay | Admitting: Family Medicine

## 2013-06-19 ENCOUNTER — Other Ambulatory Visit (INDEPENDENT_AMBULATORY_CARE_PROVIDER_SITE_OTHER): Payer: Medicare Other

## 2013-06-19 ENCOUNTER — Other Ambulatory Visit: Payer: Medicare Other

## 2013-06-19 DIAGNOSIS — E785 Hyperlipidemia, unspecified: Secondary | ICD-10-CM

## 2013-06-19 DIAGNOSIS — M109 Gout, unspecified: Secondary | ICD-10-CM

## 2013-06-19 DIAGNOSIS — E039 Hypothyroidism, unspecified: Secondary | ICD-10-CM

## 2013-06-19 DIAGNOSIS — E559 Vitamin D deficiency, unspecified: Secondary | ICD-10-CM

## 2013-06-19 LAB — COMPREHENSIVE METABOLIC PANEL
ALT: 16 U/L (ref 0–35)
AST: 24 U/L (ref 0–37)
Albumin: 3.8 g/dL (ref 3.5–5.2)
Alkaline Phosphatase: 50 U/L (ref 39–117)
BUN: 18 mg/dL (ref 6–23)
Calcium: 9.4 mg/dL (ref 8.4–10.5)
Chloride: 103 mEq/L (ref 96–112)
Creatinine, Ser: 1.5 mg/dL — ABNORMAL HIGH (ref 0.4–1.2)
GFR: 35.19 mL/min — ABNORMAL LOW (ref >60.00)
Potassium: 4.1 mEq/L (ref 3.5–5.1)
Sodium: 136 mEq/L (ref 135–145)
Total Bilirubin: 0.8 mg/dL (ref 0.3–1.2)
Total Protein: 7.1 g/dL (ref 6.0–8.3)

## 2013-06-19 LAB — TSH: TSH: 0.92 u[IU]/mL (ref 0.35–5.50)

## 2013-06-19 LAB — LIPID PANEL
Cholesterol: 134 mg/dL (ref 0–200)
HDL: 39 mg/dL — ABNORMAL LOW (ref >39.00)
LDL Cholesterol: 55 mg/dL (ref 0–99)
Total CHOL/HDL Ratio: 3

## 2013-06-19 LAB — URIC ACID: Uric Acid, Serum: 5.3 mg/dL (ref 2.4–7.0)

## 2013-06-19 NOTE — Telephone Encounter (Signed)
Message copied by Excell Seltzer on Tue Jun 19, 2013  7:54 AM ------      Message from: Alvina Chou      Created: Fri Jun 08, 2013 11:38 AM      Regarding: Lab orders for Tuesday, 12.9.14       Patient is scheduled for CPX labs, please order future labs, Thanks , Terri       ------

## 2013-06-20 LAB — VITAMIN D 25 HYDROXY (VIT D DEFICIENCY, FRACTURES): Vit D, 25-Hydroxy: 49 ng/mL (ref 30–89)

## 2013-06-26 ENCOUNTER — Encounter: Payer: Self-pay | Admitting: Family Medicine

## 2013-06-26 ENCOUNTER — Ambulatory Visit (INDEPENDENT_AMBULATORY_CARE_PROVIDER_SITE_OTHER): Payer: Medicare Other | Admitting: Family Medicine

## 2013-06-26 VITALS — BP 120/80 | HR 58 | Temp 97.6°F | Ht 66.0 in | Wt 172.5 lb

## 2013-06-26 DIAGNOSIS — Z0000 Encounter for general adult medical examination without abnormal findings: Secondary | ICD-10-CM

## 2013-06-26 DIAGNOSIS — E039 Hypothyroidism, unspecified: Secondary | ICD-10-CM

## 2013-06-26 DIAGNOSIS — E785 Hyperlipidemia, unspecified: Secondary | ICD-10-CM

## 2013-06-26 DIAGNOSIS — I10 Essential (primary) hypertension: Secondary | ICD-10-CM

## 2013-06-26 NOTE — Assessment & Plan Note (Signed)
Well controlled. Continue current medication.  

## 2013-06-26 NOTE — Patient Instructions (Addendum)
Increase welchol further to 2 twice a day if possible. Work on walking as you are able. Follow up in 6 months with fasting labs prior.

## 2013-06-26 NOTE — Assessment & Plan Note (Signed)
Stable control. 

## 2013-06-26 NOTE — Progress Notes (Signed)
HPI  I have personally reviewed the Medicare Annual Wellness questionnaire and have noted  1. The patient's medical and social history  2. Their use of alcohol, tobacco or illicit drugs  3. Their current medications and supplements  4. The patient's functional ability including ADL's, fall risks, home safety risks and hearing or visual  impairment.  5. Diet and physical activities  6. Evidence for depression or mood disorders  The patients weight, height, BMI and visual acuity have been recorded in the chart  I have made referrals, counseling and provided education to the patient based review of the above and I have provided the pt with a written personalized care plan for preventive services.    Seen in early Dec at Urgent Care for bronchitis.  Treated with Z-pack.. She is better now.  Stable SOB.   She has sold her house.. Moved into townhouse.. nearer sister. More active. She is much happier.   CRF: followed by Dr. Tyrone Schimke, Cr stable 1.3-1.5  last appt 03/2013 reviewed.   Hypertension: Well controlled on current meds.  BP Readings from Last 3 Encounters:  06/26/13 120/80  04/18/13 122/82  04/16/13 151/90  Using medication without problems or lightheadedness: None , no vision changes  Chest pain with exertion:None  Edema:None  Short of breath:stable  Average home BPs:occ  Other issues:  Exercise: walking daily... Goes to mall. Diet: good   Elevated Cholesterol: LDL NOW at goal <70 on lipitor, and WelChol two pill  In AM and one at afternoon  . Trig elevated.  Lab Results  Component Value Date   CHOL 134 06/19/2013   HDL 39.00* 06/19/2013   LDLCALC 55 06/19/2013   LDLDIRECT 55.7 12/15/2012   TRIG 200.0* 06/19/2013   CHOLHDL 3 06/19/2013    Using medications without problems:None  Muscle aches: None   Wt Readings from Last 3 Encounters:  06/26/13 172 lb 8 oz (78.245 kg)  04/18/13 175 lb 8 oz (79.606 kg)  04/12/13 179 lb (81.194 kg)    Healthy eating. Walking daily  at track.   CAD: Followed by Dr. Mariah Milling.  Last OV 04/18/2013 reviewed. Had bronchitis at that time on Z-pack/prednsione.  Carotid dopplers 02/2012 critical stenosis: Followed by vascular. Holding on CEA given risks. Asymptomatic.   New DX of PVD.  Seeing Dr. Unknown Foley for worsening pelvic organ prolapse.. Has pessary which is helping.  Possibly causing recurrent UTI.  Uncomfortable with prolapse but not pan.   Hypothyroid:  Lab Results  Component Value Date   TSH 0.92 06/19/2013     Review of Systems  Constitutional: Negative for fever, fatigue and unexpected weight change.  HENT: Negative for ear pain, congestion, sore throat, sneezing, trouble swallowing and sinus pressure.  Eyes: Negative for pain and itching.  Respiratory: Negative for cough, shortness of breath and wheezing.  Cardiovascular: Negative for chest pain, palpitations and leg swelling.  Gastrointestinal: Negative for nausea, abdominal pain, diarrhea, constipation and blood in stool.  Genitourinary: Negative for dysuria, hematuria, vaginal discharge, difficulty urinating and menstrual problem.  Skin: Negative for rash.  Neurological: Negative for syncope, weakness, light-headedness, numbness and headaches.  Psychiatric/Behavioral: Negative for confusion and dysphoric mood. The patient is not nervous/anxious.  Objective:   Physical Exam  Constitutional: Vital signs are normal. She appears well-developed and well-nourished. She is cooperative. Non-toxic appearance. She does not appear ill. No distress.  Elderly female  HENT:  Head: Normocephalic.  Right Ear: Hearing, tympanic membrane, external ear and ear canal normal.  Left Ear: Hearing, tympanic  membrane, external ear and ear canal normal.  Nose: Nose normal.  Eyes: Conjunctivae, EOM and lids are normal. Pupils are equal, round, and reactive to light. No foreign bodies found.  Neck: Trachea normal and normal range of motion. Neck supple. Carotid bruit is not present.  No mass and no thyromegaly present.  Cardiovascular: Normal rate, regular rhythm, S1 normal, S2 normal and intact distal pulses. Exam reveals no gallop.  Murmur heard.  Crescendo systolic murmur is present with a grade of 3/6  Greatest at RUSB  Pulmonary/Chest: Effort normal and breath sounds normal. No respiratory distress. She has no wheezes. She has no rhonchi. She has no rales.  Abdominal: Soft. Normal appearance and bowel sounds are normal. She exhibits no distension, no fluid wave, no abdominal bruit and no mass. There is no hepatosplenomegaly. There is no tenderness. There is no rebound, no guarding and no CVA tenderness. No hernia.  Lymphadenopathy:  She has no cervical adenopathy.  She has no axillary adenopathy.  Neurological: She is alert. She has normal strength. No cranial nerve deficit or sensory deficit.  Skin: Skin is warm, dry and intact.  Psychiatric: She has a normal mood and affect. Her speech is normal and behavior is normal. Judgment and thought content normal. Her mood appears not anxious. Cognition and memory are normal. She does not exhibit a depressed mood.  Assessment & Plan:   AMW: The patient's preventative maintenance and recommended screening tests for an annual wellness exam were reviewed in full today.  Brought up to date unless services declined.  Counselled on the importance of diet, exercise, and its role in overall health and mortality.  The patient's FH and SH was reviewed, including their home life, tobacco status, and drug and alcohol status.   Vaccines: Flu uptodate . Uptodate with PNA, shingles Td.  No indication for mammogram.  No indication for DVE/pap smear.  Colonoscopy: last in 2009.. No bowel issues.. Does not wish to repeat.  DXA: last 2010.Marland Kitchen Showed osteopenia in hip and spine... Refuses treatement so does not want to eval.  Taking CA,  vit D per nephrologist.

## 2013-06-26 NOTE — Progress Notes (Signed)
Pre-visit discussion using our clinic review tool. No additional management support is needed unless otherwise documented below in the visit note.  

## 2013-06-26 NOTE — Assessment & Plan Note (Signed)
LDL improved control.. Now at goal < 70. with addition of welchol. She wil continue to titrate up as trigs are still not at goal.

## 2013-07-06 ENCOUNTER — Other Ambulatory Visit: Payer: Self-pay | Admitting: Family Medicine

## 2013-07-09 ENCOUNTER — Ambulatory Visit: Payer: Medicare Other | Admitting: Cardiovascular Disease

## 2013-08-22 ENCOUNTER — Encounter: Payer: Self-pay | Admitting: Vascular Surgery

## 2013-08-23 ENCOUNTER — Encounter: Payer: Self-pay | Admitting: Vascular Surgery

## 2013-08-23 ENCOUNTER — Ambulatory Visit (INDEPENDENT_AMBULATORY_CARE_PROVIDER_SITE_OTHER): Payer: Medicare Other | Admitting: Vascular Surgery

## 2013-08-23 ENCOUNTER — Ambulatory Visit (HOSPITAL_COMMUNITY)
Admission: RE | Admit: 2013-08-23 | Discharge: 2013-08-23 | Disposition: A | Discharge status: Home / Self Care | Payer: Medicare Other | Source: Ambulatory Visit | Attending: Vascular Surgery | Admitting: Vascular Surgery

## 2013-08-23 VITALS — BP 144/91 | HR 67 | Ht 66.0 in | Wt 176.0 lb

## 2013-08-23 DIAGNOSIS — I6529 Occlusion and stenosis of unspecified carotid artery: Secondary | ICD-10-CM

## 2013-08-23 DIAGNOSIS — Z48812 Encounter for surgical aftercare following surgery on the circulatory system: Secondary | ICD-10-CM

## 2013-08-23 NOTE — Progress Notes (Signed)
[facility] Progress Note  08/23/2013 3:25 PM  Subjective: Patient is an 80 year old female with a known high-grade right internal carotid artery stenosis. Her carotid duplex results today are similar to results from February 2014. In the past she was thought to be high risk for right carotid endarterectomy due to coronary artery disease. She was not a candidate for carotid stenting due to to marginal renal function. She continues to have no symptoms of TIA amaurosis or stroke. She is on aspirin and Plavix. Other chronic medical problems include hyperlipidemia hypertension both of which are currently controlled. Her cholesterol lowering agent was recently changed to WelChol.   Past Medical History   Diagnosis  Date   .  Aortic stenosis      Moderate; Echo 12/09. moderate AS mean 19. AVA 0.88   .  PAD (peripheral artery disease)      s/p iliac stents   .  HTN (hypertension)    .  Hyperparathyroidism    .  Hypothyroidism    .  GERD (gastroesophageal reflux disease)    .  Depression  10/26/2004     With death of spouse   .  Ventral hernia      Moderate sized   .  CAD (coronary artery disease)      s/p NSTEMI 1/07 with BMS to OM; cath 12/09 due to positive Myoview, LM ok. LAD 40%. LCX 80-90% mid. RCA chronically occluded with R --> L collats   .  AAA (abdominal aortic aneurysm)      Recurrent; s/p repair, now 3.8 cam; h/o ischemic bowel 10/26/04   .  Carotid artery stenosis      R 60-79% L 0-39% (6/10) - stable   .  Hyperlipidemia      Increased LFTs on Vytorin. Changed to Lipitor.   Marland Kitchen  CRI (chronic renal insufficiency)      Mild, Cr 1.3   .  Renal artery stenosis      Atrophic L kidney. Moderate blockage on R   .  Basal cell carcinoma of nose  1980s   .  Incontinence    .  Full dentures    .  Bruises easily    .  Myocardial infarction  2005-10-26    Review of systems: She denies shortness of breath. She denies chest pain.   Physical exam:   Filed Vitals:   08/23/13 1458  08/23/13 1500  BP: 153/79 144/91  Pulse: 67   Height: 5\' 6"  (1.676 m)   Weight: 176 lb (79.833 kg)   SpO2: 99%    Neck: Soft right carotid bruit  Extremities: 2+ radial pulses equal bilaterally Neuro: No focal deficits. Sensation intact. Upper and lower extremities 5/5 strength bilaterally.   Data: Patient had a carotid duplex exam today which I reviewed and interpreted. This shows a greater than 80% right internal carotid artery stenosis with a high bifurcation.   Assessment: High-grade right internal carotid artery stenosis, asymptomatic in a patient with multiple medical factors including severe coronary disease and renal insufficiency.   Plan: We will continue to follow her clinically. She will have a repeat carotid duplex exam in one year. She will continue Plavix and aspirin. If she has any symptoms related to her carotid disease we would again reconsider at that point whether or not carotid endarterectomy her carotid stenting would be in her interest.    Virgina Jock, Student PA [facility] [place]: [phone]  Pager: [phone]  History and exam details as above. We'll continue to monitor her if she develops signs or symptoms of TIA amaurosis or stroke would consider carotid intervention at that point. She is currently asymptomatic otherwise I believe she is very high risk for cardiac or renal complications. Followup carotid duplex exam in one year continue aspirin and Plavix  [name], MD [facility] [place] Office: [phone] Pager: [phone]

## 2013-08-24 ENCOUNTER — Other Ambulatory Visit: Payer: Self-pay | Admitting: Family Medicine

## 2013-08-24 NOTE — Addendum Note (Signed)
Addended by: Dorthula Rue L on: 08/24/2013 03:47 PM   Modules accepted: Orders

## 2013-09-09 ENCOUNTER — Telehealth: Payer: Self-pay | Admitting: Family Medicine

## 2013-09-10 NOTE — Telephone Encounter (Signed)
Pt left v/m; pt is getting overlapping of refills for her meds from [facility]; pt said she got call from [facility] that atorvastatin will be sent out. Pt spoke with [facility] but pt was told Dr Diona Browner office prescribed atorvastatin to be sent out to pt. Pt said do not fill any refills unless pt calls and ask for med refill.

## 2013-09-10 NOTE — Telephone Encounter (Signed)
Noted  

## 2013-09-20 ENCOUNTER — Emergency Department: Payer: Self-pay | Admitting: Emergency Medicine

## 2013-09-22 ENCOUNTER — Other Ambulatory Visit: Payer: Self-pay | Admitting: Cardiovascular Disease

## 2013-10-15 ENCOUNTER — Encounter: Payer: Self-pay | Admitting: Cardiovascular Disease

## 2013-10-15 ENCOUNTER — Ambulatory Visit (INDEPENDENT_AMBULATORY_CARE_PROVIDER_SITE_OTHER): Payer: Medicare Other | Admitting: Cardiovascular Disease

## 2013-10-15 VITALS — BP 118/82 | HR 63 | Ht 67.0 in | Wt 171.0 lb

## 2013-10-15 DIAGNOSIS — I2510 Atherosclerotic heart disease of native coronary artery without angina pectoris: Secondary | ICD-10-CM

## 2013-10-15 DIAGNOSIS — I739 Peripheral vascular disease, unspecified: Secondary | ICD-10-CM

## 2013-10-15 DIAGNOSIS — I10 Essential (primary) hypertension: Secondary | ICD-10-CM

## 2013-10-15 DIAGNOSIS — E785 Hyperlipidemia, unspecified: Secondary | ICD-10-CM

## 2013-10-15 NOTE — Assessment & Plan Note (Signed)
Blood pressure is well controlled on today's visit. No changes made to the medications. 

## 2013-10-15 NOTE — Assessment & Plan Note (Addendum)
Cholesterol is at goal on the current lipid regimen. No changes to the medications were made. We have asked her to talk with the pharmacist to see if colestipol might be cheaper than WelChol

## 2013-10-15 NOTE — Assessment & Plan Note (Signed)
Carotid disease, critical him a followed by vascular in [place]. Continue aggressive cholesterol management and surveillance

## 2013-10-15 NOTE — Assessment & Plan Note (Signed)
Currently with no symptoms of angina. No further workup at this time. Continue current medication regimen. 

## 2013-10-15 NOTE — Patient Instructions (Signed)
You are doing well. No medication changes were made.  Ask the pharmacist about colestipol (instead of welchol) to save money  Please call us if you have new issues that need to be addressed before your next appt.  Your physician wants you to follow-up in: 6 months.  You will receive a reminder letter in the mail two months in advance. If you don't receive a letter, please call our office to schedule the follow-up appointment.

## 2013-10-15 NOTE — Progress Notes (Signed)
Patient ID: [name], female    DOB: 1928-06-24, 80 y.o.   MRN: [id number]  HPI Comments: [name] is an 80 year old woman with history of coronary artery disease, status post non-ST-elevation myocardial infarction in January 2007, treated with bare-metal stenting to the obtuse marginal,  totally occluded right coronary with distal collateralization and signifcant LCX disease which is being treated medically as it is not easily amenable to intervention. She has normal LV function, Peripheral vascular disease  with previous abdominal aortic aneurysm repair and bilateral iliac stenting, carotid artery stenosis (critical on the right) followed by Dr. Oneida Alar, renal artery stenosis with an atrophic left kidney and a moderate blockage on the right, hypertension and hyperlipidemia. Also with mild to moderate aortic valve  Stenosis in 2012.  She stopped smoking approximately 8 years ago, smoked for 20-30 years but the exact number of years is vague In followup today, she reports that she is doing well. She suffered an accident and fractured her fifth finger, currently in a splint. No surgery needed  Tolerating her medications well, total cholesterol 134 in December 2014 She tolerated WelChol one pill twice a day.   she feels well with no complaints .   carotid ultrasound showed progression of the stenosis on the right. Estimate is 80-99%.  She has seen Dr. Oneida Alar with repeat followup and ultrasound in 2014. Plan was made to watch her closely with possible stenting with any symptoms of TIA or stroke   Aortic ultrasound showed tortuous ectatic aorta biiliac bypass graft. Maximum dimensions 3.5 x 3.3 cm, dilated left common iliac artery 2 cm Recent echocardiogram EF 55-65% Mild to moderate AS   EKG shows  normal sinus rhythm with rate 63 beats per minute with no significant ST or T wave changes      Outpatient Encounter Prescriptions as of 10/15/2013  Medication Sig  . acetaminophen (TYLENOL)  500 MG tablet Take 1,000 mg by mouth 2 (two) times daily as needed for pain.  Marland Kitchen albuterol (PROVENTIL HFA;VENTOLIN HFA) 108 (90 BASE) MCG/ACT inhaler Inhale 2 puffs into the lungs as needed for wheezing.  Marland Kitchen aspirin EC 81 MG tablet Take 81 mg by mouth at bedtime.  Marland Kitchen atorvastatin (LIPITOR) 80 MG tablet TAKE 1 TABLET DAILY  . benzonatate (TESSALON) 200 MG capsule Take 1 capsule (200 mg total) by mouth 3 (three) times daily as needed for cough.  . Calcium Carbonate (CALTRATE 600) 1500 MG TABS Take 1 tablet by mouth daily.   . Cholecalciferol 1000 UNITS capsule Take 3,000 Units by mouth daily.  . clopidogrel (PLAVIX) 75 MG tablet TAKE 1 TABLET DAILY  . colesevelam (WELCHOL) 625 MG tablet Take 625-1,250 mg by mouth 2 (two) times daily with a meal. 2 tablets (1250 mg) at lunch, 1 tablet (625 mg) at bedtime  . estradiol (ESTRACE VAGINAL) 0.1 MG/GM vaginal cream Place 2 g vaginally 2 (two) times a week. Thursday and Sunday  . Febuxostat (ULORIC) 80 MG TABS Take 80 mg by mouth daily.  . isosorbide mononitrate (IMDUR) 30 MG 24 hr tablet Take 1 tablet (30 mg total) by mouth 2 (two) times daily.  Marland Kitchen levothyroxine (SYNTHROID, LEVOTHROID) 112 MCG tablet TAKE 1 TABLET DAILY  . lisinopril (PRINIVIL,ZESTRIL) 10 MG tablet Take 10 mg by mouth daily.  . metoprolol tartrate (LOPRESSOR) 25 MG tablet TAKE ONE-HALF (1/2) TABLET TWICE A DAY     Review of Systems  Constitutional: Negative.   HENT: Negative.   Eyes: Negative.   Cardiovascular: Negative.  Gastrointestinal: Negative.   Endocrine: Negative.   Musculoskeletal:       Hand in a splint from recent fracture  Skin: Negative.   Allergic/Immunologic: Negative.   Neurological: Negative.   Hematological: Negative.   Psychiatric/Behavioral: Negative.   All other systems reviewed and are negative.    BP 118/82  Pulse 63  Ht 5\' 7"  (1.702 m)  Wt 171 lb (77.565 kg)  BMI 26.78 kg/m2  Physical Exam  Nursing note and vitals reviewed. Constitutional: She  is oriented to person, place, and time. She appears well-developed and well-nourished.  HENT:  Head: Normocephalic.  Nose: Nose normal.  Mouth/Throat: Oropharynx is clear and moist.  Eyes: Conjunctivae are normal. Pupils are equal, round, and reactive to light.  Neck: Normal range of motion. Neck supple. No JVD present. Carotid bruit is present.  Cardiovascular: Normal rate, regular rhythm, S1 normal, S2 normal and intact distal pulses.  Exam reveals no gallop and no friction rub.   Murmur heard.  Systolic murmur is present with a grade of 2/6  Pulmonary/Chest: Effort normal and breath sounds normal. No respiratory distress. She has no wheezes. She has no rales. She exhibits no tenderness.  Abdominal: Soft. Bowel sounds are normal. She exhibits no distension. There is no tenderness.  Musculoskeletal: Normal range of motion. She exhibits no edema and no tenderness.  Lymphadenopathy:    She has no cervical adenopathy.  Neurological: She is alert and oriented to person, place, and time. Coordination normal.  Skin: Skin is warm and dry. No rash noted. No erythema.  Psychiatric: She has a normal mood and affect. Her behavior is normal. Judgment and thought content normal.    Assessment and Plan

## 2013-10-24 ENCOUNTER — Ambulatory Visit (INDEPENDENT_AMBULATORY_CARE_PROVIDER_SITE_OTHER): Payer: Medicare Other | Admitting: Podiatry

## 2013-10-24 ENCOUNTER — Encounter: Payer: Self-pay | Admitting: Podiatry

## 2013-10-24 VITALS — BP 116/82 | HR 62 | Resp 16 | Ht 66.0 in | Wt 171.0 lb

## 2013-10-24 DIAGNOSIS — S90229A Contusion of unspecified lesser toe(s) with damage to nail, initial encounter: Secondary | ICD-10-CM

## 2013-10-24 DIAGNOSIS — S90129A Contusion of unspecified lesser toe(s) without damage to nail, initial encounter: Secondary | ICD-10-CM

## 2013-10-24 NOTE — Progress Notes (Signed)
   Subjective:    Patient ID: [name], female    DOB: 12/27/1927, 80 y.o.   MRN: [id number]  HPI Comments: dont know when i injured my toe, but i took off nail polish and there is blood under the toenail. Left great toenail      Review of Systems  All other systems reviewed and are negative.      Objective:   Physical Exam: I have reviewed her past medical history medications allergies surgeries social history. Pulses are palpable bilateral. Neurologic sensorium is intact. Cutaneous evaluation does demonstrate subungual hematoma to the hallux nails bilateral the nails are not listed do not demonstrate any signs of infection.        Assessment & Plan:  Assessment: Subungual hematoma hallux bilateral.  Plan: Followup with me as needed.

## 2013-11-04 ENCOUNTER — Telehealth: Payer: Self-pay | Admitting: Family Medicine

## 2013-11-04 DIAGNOSIS — E785 Hyperlipidemia, unspecified: Secondary | ICD-10-CM

## 2013-11-04 DIAGNOSIS — M109 Gout, unspecified: Secondary | ICD-10-CM

## 2013-11-04 DIAGNOSIS — E039 Hypothyroidism, unspecified: Secondary | ICD-10-CM

## 2013-11-04 NOTE — Telephone Encounter (Signed)
Message copied by Jinny Sanders on Sun Nov 04, 2013 11:47 PM ------      Message from: Ellamae Sia      Created: Tue Oct 30, 2013  8:29 AM      Regarding: Lab orders for Tuesday, 4.28.15       Lab orders for f/u ------

## 2013-11-06 ENCOUNTER — Other Ambulatory Visit (INDEPENDENT_AMBULATORY_CARE_PROVIDER_SITE_OTHER): Payer: Medicare Other

## 2013-11-06 DIAGNOSIS — E785 Hyperlipidemia, unspecified: Secondary | ICD-10-CM

## 2013-11-06 DIAGNOSIS — E039 Hypothyroidism, unspecified: Secondary | ICD-10-CM

## 2013-11-06 LAB — COMPREHENSIVE METABOLIC PANEL
ALBUMIN: 3.9 g/dL (ref 3.5–5.2)
ALK PHOS: 53 U/L (ref 39–117)
ALT: 15 U/L (ref 0–35)
AST: 22 U/L (ref 0–37)
BILIRUBIN TOTAL: 0.6 mg/dL (ref 0.3–1.2)
BUN: 32 mg/dL — ABNORMAL HIGH (ref 6–23)
CO2: 27 mEq/L (ref 19–32)
Calcium: 9.7 mg/dL (ref 8.4–10.5)
Chloride: 109 mEq/L (ref 96–112)
Creatinine, Ser: 1.5 mg/dL — ABNORMAL HIGH (ref 0.4–1.2)
GFR: 35.43 mL/min — ABNORMAL LOW (ref >60.00)
Glucose, Bld: 95 mg/dL (ref 70–99)
Potassium: 4.4 mEq/L (ref 3.5–5.1)
SODIUM: 143 meq/L (ref 135–145)
TOTAL PROTEIN: 6.7 g/dL (ref 6.0–8.3)

## 2013-11-06 LAB — LIPID PANEL
CHOL/HDL RATIO: 4
Cholesterol: 166 mg/dL (ref 0–200)
HDL: 45.5 mg/dL (ref >39.00)
LDL Cholesterol: 78 mg/dL (ref 0–99)
Triglycerides: 211 mg/dL — ABNORMAL HIGH (ref 0.0–149.0)
VLDL: 42.2 mg/dL — AB (ref 0.0–40.0)

## 2013-11-13 ENCOUNTER — Ambulatory Visit: Payer: Medicare Other | Admitting: Family Medicine

## 2013-11-27 ENCOUNTER — Telehealth: Payer: Self-pay | Admitting: Family Medicine

## 2013-11-27 ENCOUNTER — Ambulatory Visit (INDEPENDENT_AMBULATORY_CARE_PROVIDER_SITE_OTHER): Payer: Medicare Other | Admitting: Family Medicine

## 2013-11-27 ENCOUNTER — Encounter: Payer: Self-pay | Admitting: Family Medicine

## 2013-11-27 VITALS — BP 100/66 | HR 53 | Temp 98.2°F | Ht 67.0 in | Wt 168.8 lb

## 2013-11-27 DIAGNOSIS — M109 Gout, unspecified: Secondary | ICD-10-CM

## 2013-11-27 DIAGNOSIS — E785 Hyperlipidemia, unspecified: Secondary | ICD-10-CM

## 2013-11-27 DIAGNOSIS — I10 Essential (primary) hypertension: Secondary | ICD-10-CM

## 2013-11-27 DIAGNOSIS — N259 Disorder resulting from impaired renal tubular function, unspecified: Secondary | ICD-10-CM

## 2013-11-27 NOTE — Progress Notes (Signed)
80 year old female presents for 6 month follow up.  CRF: followed by Dr. Zara Chess, Cr stable 1.3-1.5  last appt 03/2013 reviewed.   Hypertension: Well controlled on current meds.  BP Readings from Last 3 Encounters:  11/27/13 100/66  10/24/13 116/82  10/15/13 118/82  Using medication without problems or lightheadedness: None , no vision changes  Chest pain with exertion:None  Edema:None  Short of breath:stable  Average home BPs:occ  Other issues:  Exercise: walking daily... Goes to mall.  Diet: good   Elevated Cholesterol: LDL almost at goal <70 on lipitor, and WelChol two pill In AM and one at afternoon  . Trig elevated.  Lab Results  Component Value Date   CHOL 166 11/06/2013   HDL 45.50 11/06/2013   LDLCALC 78 11/06/2013   LDLDIRECT 55.7 12/15/2012   TRIG 211.0* 11/06/2013   CHOLHDL 4 11/06/2013  Using medications without problems:None  Muscle aches: None   Wt Readings from Last 3 Encounters:  11/27/13 168 lb 12 oz (76.544 kg)  10/24/13 171 lb (77.565 kg)  10/15/13 171 lb (77.565 kg)  Healthy eating. Walking daily at track.   CAD: Followed by Dr. Rockey Situ.  Carotid dopplers 02/2012 critical stenosis: Followed by vascular. Holding on CEA given risks. Asymptomatic.  New DX of PVD.  Seeing Dr. Carrie Mew for worsening pelvic organ prolapse.. Has pessary which is helping.  Possibly causing recurrent UTI.  Uncomfortable with prolapse but not pain.   Review of Systems  Constitutional: Negative for fever, fatigue and unexpected weight change.  HENT: Negative for ear pain, congestion, sore throat, sneezing, trouble swallowing and sinus pressure.  Eyes: Negative for pain and itching.  Respiratory: Negative for cough, shortness of breath and wheezing.  Cardiovascular: Negative for chest pain, palpitations and leg swelling.  Gastrointestinal: Negative for nausea, abdominal pain, diarrhea, constipation and blood in stool.  Genitourinary: Negative for dysuria, hematuria, vaginal  discharge, difficulty urinating and menstrual problem.  Skin: Negative for rash.  Neurological: Negative for syncope, weakness, light-headedness, numbness and headaches.  Psychiatric/Behavioral: Negative for confusion and dysphoric mood. The patient is not nervous/anxious.  Objective:   Physical Exam  Constitutional: Vital signs are normal. She appears well-developed and well-nourished. She is cooperative. Non-toxic appearance. She does not appear ill. No distress.  Elderly female  HENT:  Head: Normocephalic.  Right Ear: Hearing, tympanic membrane, external ear and ear canal normal.  Left Ear: Hearing, tympanic membrane, external ear and ear canal normal.  Nose: Nose normal.  Eyes: Conjunctivae, EOM and lids are normal. Pupils are equal, round, and reactive to light. No foreign bodies found.  Neck: Trachea normal and normal range of motion. Neck supple. Carotid bruit is not present. No mass and no thyromegaly present.  Cardiovascular: Normal rate, regular rhythm, S1 normal, S2 normal and intact distal pulses. Exam reveals no gallop.  Murmur heard.  Crescendo systolic murmur is present with a grade of 3/6  Greatest at RUSB  Pulmonary/Chest: Effort normal and breath sounds normal. No respiratory distress. She has no wheezes. She has no rhonchi. She has no rales.  Abdominal: Soft. Normal appearance and bowel sounds are normal. She exhibits no distension, no fluid wave, no abdominal bruit and no mass. There is no hepatosplenomegaly. There is no tenderness. There is no rebound, no guarding and no CVA tenderness. No hernia.  Lymphadenopathy:  She has no cervical adenopathy.  She has no axillary adenopathy.  Neurological: She is alert. She has normal strength. No cranial nerve deficit or sensory deficit.  Skin:  Skin is warm, dry and intact.  Psychiatric: She has a normal mood and affect. Her speech is normal and behavior is normal. Judgment and thought content normal. Her mood appears not anxious.  Cognition and memory are normal. She does not exhibit a depressed mood.

## 2013-11-27 NOTE — Telephone Encounter (Signed)
Relevant patient education assigned to patient using Emmi. ° °

## 2013-11-27 NOTE — Assessment & Plan Note (Signed)
Great control on welchol and lipitor. Encouraged exercise, weight loss, healthy eating habits.

## 2013-11-27 NOTE — Addendum Note (Signed)
Addended by: Eliezer Lofts E on: 11/27/2013 10:42 AM   Modules accepted: Level of Service

## 2013-11-27 NOTE — Assessment & Plan Note (Signed)
Well controlled. Continue current medication.  

## 2013-11-27 NOTE — Patient Instructions (Addendum)
Call if interested in trying colestipol instead of welchol. Ask pharmacist if it is cheaper.  Scheduled medicare wellness in 6 months.

## 2013-11-27 NOTE — Assessment & Plan Note (Signed)
No flares in last 1-2 years. Followed by Dr. Ouida Sills.

## 2013-11-27 NOTE — Progress Notes (Signed)
Pre visit review using our clinic review tool, if applicable. No additional management support is needed unless otherwise documented below in the visit note. 

## 2013-11-27 NOTE — Assessment & Plan Note (Signed)
Stable

## 2013-12-10 ENCOUNTER — Telehealth: Payer: Self-pay | Admitting: Family Medicine

## 2013-12-10 NOTE — Telephone Encounter (Signed)
Pt's daughter, Ulis Rias called and says patient has had a major behavioral change over the weekend.  Pt was verbally abusive w/Kaye, pt seems "mad at the world" and is just angry at everyone/everything. Ulis Rias would like to speak w/you in reference to this behavior.  I offered her an appointment and also offered to transfer her to a nurse but she says she would rather speak w/you. Please call pt's daughter.  I did advise her that you would not be in until tomorrow. Thank you.

## 2013-12-11 ENCOUNTER — Other Ambulatory Visit: Payer: Self-pay | Admitting: Cardiovascular Disease

## 2013-12-11 NOTE — Telephone Encounter (Signed)
Spoke to daughter in detail. Mother is angry and depressed. Anxiety is not well controlled, anxious about her neighbor. Stress ongoing in neighborhood.  Has had 2 episodes of "funk" in month, worst one in last weekend.  Not going out, sitting in dark at times.   No fever, no recent falls, no confusion. No neuro changes. No new medications.  Daughter feels pt is safe and feels safe.  Recommended having mother set up with church counselor. If not doing better, daughter will have pt call for appt to discuss.

## 2013-12-12 ENCOUNTER — Other Ambulatory Visit: Payer: Self-pay | Admitting: Family Medicine

## 2014-03-26 ENCOUNTER — Other Ambulatory Visit: Payer: Self-pay | Admitting: Cardiovascular Disease

## 2014-04-02 ENCOUNTER — Ambulatory Visit (INDEPENDENT_AMBULATORY_CARE_PROVIDER_SITE_OTHER): Payer: Medicare Other

## 2014-04-02 ENCOUNTER — Telehealth: Payer: Self-pay | Admitting: Family Medicine

## 2014-04-02 DIAGNOSIS — Z23 Encounter for immunization: Secondary | ICD-10-CM

## 2014-04-02 NOTE — Telephone Encounter (Signed)
Handicap Placard Application completed and placed in Dr. Bedsole's in box for signature. 

## 2014-04-02 NOTE — Telephone Encounter (Signed)
Pt dropped off handicapp form to be filled out On donna's desk  Pt would like this mailed back to her   Attached is self address stamped envelope

## 2014-04-22 ENCOUNTER — Other Ambulatory Visit: Payer: Self-pay | Admitting: Family Medicine

## 2014-04-24 ENCOUNTER — Other Ambulatory Visit: Payer: Self-pay | Admitting: *Deleted

## 2014-04-24 MED ORDER — LISINOPRIL 10 MG PO TABS: 10.0000 mg | ORAL_TABLET | Freq: Every day | ORAL | Status: DC

## 2014-05-07 ENCOUNTER — Other Ambulatory Visit: Payer: Self-pay

## 2014-05-14 ENCOUNTER — Other Ambulatory Visit: Payer: Self-pay

## 2014-05-14 MED ORDER — METOPROLOL TARTRATE 25 MG PO TABS: ORAL_TABLET | ORAL | Status: DC

## 2014-05-14 MED ORDER — COLESEVELAM HCL 625 MG PO TABS: 625.0000 mg | ORAL_TABLET | Freq: Three times a day (TID) | ORAL | Status: DC

## 2014-05-18 ENCOUNTER — Other Ambulatory Visit: Payer: Self-pay | Admitting: Family Medicine

## 2014-05-27 ENCOUNTER — Ambulatory Visit (INDEPENDENT_AMBULATORY_CARE_PROVIDER_SITE_OTHER): Payer: Medicare Other | Admitting: Family Medicine

## 2014-05-27 ENCOUNTER — Encounter: Payer: Self-pay | Admitting: Family Medicine

## 2014-05-27 VITALS — BP 100/66 | HR 73 | Temp 98.1°F | Ht 67.0 in | Wt 171.5 lb

## 2014-05-27 DIAGNOSIS — Z87891 Personal history of nicotine dependence: Secondary | ICD-10-CM

## 2014-05-27 DIAGNOSIS — J209 Acute bronchitis, unspecified: Secondary | ICD-10-CM

## 2014-05-27 MED ORDER — AZITHROMYCIN 250 MG PO TABS: ORAL_TABLET | ORAL | Status: DC

## 2014-05-27 MED ORDER — ALBUTEROL SULFATE HFA 108 (90 BASE) MCG/ACT IN AERS: 2.0000 | INHALATION_SPRAY | Freq: Four times a day (QID) | RESPIRATORY_TRACT | Status: DC | PRN

## 2014-05-27 NOTE — Progress Notes (Signed)
Pre visit review using our clinic review tool, if applicable. No additional management support is needed unless otherwise documented below in the visit note. 

## 2014-05-27 NOTE — Progress Notes (Signed)
Dr. Frederico Hamman T. Caia Lofaro, MD, [facility] [place], [address] Phone: [phone] Fax: [phone]  05/27/2014  Patient: [name], MRN: [id number], DOB: 11-Sep-1927, 80 y.o.  Primary Physician:  Eliezer Lofts, MD  Chief Complaint: No chief complaint on file.  Subjective:   MAYDELIN DEMING is a 80 y.o. very pleasant female patient who presents with the following:  Cold and sinuses have got her. Did some work outside in the sunny days - wind it will get in her sinuses. Last week about a week, throat started to get sore some. Using some Hall's.   Sinuses and full mucous. Cannot get it up.   The patient has a 30-pack-year history has multiple medical problems and is 80 years old. She is coughing with significant production of sputum, and she also has significant sinus pain. She quit smoking approximately 10 years ago. She is not having any systemic fever  Past Medical History, Surgical History, Social History, Family History, Problem List, Medications, and Allergies have been reviewed and updated if relevant.  ROS: GEN: Acute illness details above GI: Tolerating PO intake GU: maintaining adequate hydration and urination Pulm: No SOB Interactive and getting along well at home.  Otherwise, ROS is as per the HPI.   Objective:   Blood pressure 100/66, pulse 73, temperature 98.1 F (36.7 C), temperature source Oral, height 5\' 7"  (1.702 m), weight 171 lb 8 oz (77.792 kg).    GEN: A and O x 3. WDWN. NAD.    ENT: Nose clear, ext NML.  No LAD.  No JVD.  TM's clear. Oropharynx clear.  PULM: Normal WOB, no distress. No crackles, scattered wheeezes b. CV: RRR, 3/6 SEM, No rubs, No JVD.   EXT: warm and well-perfused, No c/c/e. PSYCH: Pleasant and conversant.    Laboratory and Imaging Data:  Assessment and Plan:   Acute bronchitis with bronchospasm  Stopped smoking with greater than 30 pack year history  Given  active bronchospasm and history of smoking, suspected the patient may have some underlying COPD. I will give her an albuterol inhaler and Zithromax.  If she is not improving in a few days, we can add some oral steroids  Follow-up: No Follow-up on file.  New Prescriptions   ALBUTEROL (PROVENTIL HFA;VENTOLIN HFA) 108 (90 BASE) MCG/ACT INHALER    Inhale 2 puffs into the lungs every 6 (six) hours as needed for wheezing or shortness of breath.   AZITHROMYCIN (ZITHROMAX) 250 MG TABLET    2 tabs po on day 1, then 1 tab po for 4 days   No orders of the defined types were placed in this encounter.    Signed,  Maud Deed. Martasia Talamante, MD   Patient's Medications  New Prescriptions   ALBUTEROL (PROVENTIL HFA;VENTOLIN HFA) 108 (90 BASE) MCG/ACT INHALER    Inhale 2 puffs into the lungs every 6 (six) hours as needed for wheezing or shortness of breath.   AZITHROMYCIN (ZITHROMAX) 250 MG TABLET    2 tabs po on day 1, then 1 tab po for 4 days  Previous Medications   ACETAMINOPHEN (TYLENOL) 500 MG TABLET    Take 1,000 mg by mouth 2 (two) times daily as needed for pain.   ASPIRIN EC 81 MG TABLET    Take 81 mg by mouth at bedtime.   ATORVASTATIN (LIPITOR) 80 MG TABLET    TAKE 1 TABLET DAILY   CALCIUM CARBONATE (CALTRATE 600) 1500 MG TABS  Take 1 tablet by mouth daily.    CHOLECALCIFEROL 1000 UNITS CAPSULE    Take 3,000 Units by mouth daily.   CLOPIDOGREL (PLAVIX) 75 MG TABLET    TAKE 1 TABLET DAILY   COLESEVELAM (WELCHOL) 625 MG TABLET    Take 1 tablet (625 mg total) by mouth 3 (three) times daily.   ESTRADIOL (ESTRACE VAGINAL) 0.1 MG/GM VAGINAL CREAM    Place 2 g vaginally 2 (two) times a week. Thursday and Sunday   FEBUXOSTAT (ULORIC) 80 MG TABS    Take 80 mg by mouth daily.   ISOSORBIDE MONONITRATE (IMDUR) 30 MG 24 HR TABLET    Take 1 tablet (30 mg total) by mouth 2 (two) times daily.   LEVOTHYROXINE (SYNTHROID, LEVOTHROID) 112 MCG TABLET    TAKE 1 TABLET DAILY   LISINOPRIL (PRINIVIL,ZESTRIL) 10 MG  TABLET    Take 1 tablet (10 mg total) by mouth daily.   METOPROLOL TARTRATE (LOPRESSOR) 25 MG TABLET    Take one-half (1/2) tablet twice a day.  Modified Medications   No medications on file  Discontinued Medications   ISOSORBIDE MONONITRATE (IMDUR) 30 MG 24 HR TABLET    TAKE 1 TABLET TWICE A DAY

## 2014-05-29 ENCOUNTER — Emergency Department: Payer: Self-pay | Admitting: Emergency Medicine

## 2014-05-29 LAB — CBC WITH DIFFERENTIAL/PLATELET
BASOS ABS: 0.1 10*3/uL (ref 0.0–0.1)
BASOS PCT: 0.5 %
EOS ABS: 0.2 10*3/uL (ref 0.0–0.7)
Eosinophil %: 1.4 %
HCT: 35.5 % (ref 35.0–47.0)
HGB: 11.7 g/dL — ABNORMAL LOW (ref 12.0–16.0)
Lymphocyte #: 1.9 10*3/uL (ref 1.0–3.6)
Lymphocyte %: 15 %
MCH: 29.5 pg (ref 26.0–34.0)
MCHC: 33 g/dL (ref 32.0–36.0)
MCV: 89 fL (ref 80–100)
MONO ABS: 1.3 x10 3/mm — AB (ref 0.2–0.9)
MONOS PCT: 10.2 %
NEUTROS PCT: 72.9 %
Neutrophil #: 9 10*3/uL — ABNORMAL HIGH (ref 1.4–6.5)
PLATELETS: 201 10*3/uL (ref 150–440)
RBC: 3.97 10*6/uL (ref 3.80–5.20)
RDW: 14.7 % — AB (ref 11.5–14.5)
WBC: 12.3 10*3/uL — AB (ref 3.6–11.0)

## 2014-05-30 ENCOUNTER — Inpatient Hospital Stay (HOSPITAL_COMMUNITY)
Admission: AD | Admit: 2014-05-30 | Discharge: 2014-06-02 | DRG: 194 | Disposition: A | Discharge status: Home / Self Care | Payer: Medicare Other | Source: Other Acute Inpatient Hospital | Attending: Internal Medicine | Admitting: Internal Medicine

## 2014-05-30 DIAGNOSIS — E039 Hypothyroidism, unspecified: Secondary | ICD-10-CM | POA: Diagnosis present

## 2014-05-30 DIAGNOSIS — Z955 Presence of coronary angioplasty implant and graft: Secondary | ICD-10-CM | POA: Diagnosis not present

## 2014-05-30 DIAGNOSIS — I712 Thoracic aortic aneurysm, without rupture: Secondary | ICD-10-CM | POA: Diagnosis present

## 2014-05-30 DIAGNOSIS — Q600 Renal agenesis, unilateral: Secondary | ICD-10-CM

## 2014-05-30 DIAGNOSIS — Z85828 Personal history of other malignant neoplasm of skin: Secondary | ICD-10-CM | POA: Diagnosis not present

## 2014-05-30 DIAGNOSIS — J189 Pneumonia, unspecified organism: Principal | ICD-10-CM | POA: Diagnosis present

## 2014-05-30 DIAGNOSIS — F329 Major depressive disorder, single episode, unspecified: Secondary | ICD-10-CM | POA: Diagnosis present

## 2014-05-30 DIAGNOSIS — I959 Hypotension, unspecified: Secondary | ICD-10-CM | POA: Diagnosis present

## 2014-05-30 DIAGNOSIS — E785 Hyperlipidemia, unspecified: Secondary | ICD-10-CM | POA: Diagnosis present

## 2014-05-30 DIAGNOSIS — Z7982 Long term (current) use of aspirin: Secondary | ICD-10-CM | POA: Diagnosis not present

## 2014-05-30 DIAGNOSIS — I7123 Aneurysm of the descending thoracic aorta, without rupture: Secondary | ICD-10-CM | POA: Diagnosis present

## 2014-05-30 DIAGNOSIS — I2510 Atherosclerotic heart disease of native coronary artery without angina pectoris: Secondary | ICD-10-CM | POA: Diagnosis present

## 2014-05-30 DIAGNOSIS — E780 Pure hypercholesterolemia: Secondary | ICD-10-CM | POA: Diagnosis present

## 2014-05-30 DIAGNOSIS — I739 Peripheral vascular disease, unspecified: Secondary | ICD-10-CM | POA: Diagnosis present

## 2014-05-30 DIAGNOSIS — I350 Nonrheumatic aortic (valve) stenosis: Secondary | ICD-10-CM | POA: Diagnosis present

## 2014-05-30 DIAGNOSIS — I6521 Occlusion and stenosis of right carotid artery: Secondary | ICD-10-CM | POA: Diagnosis present

## 2014-05-30 DIAGNOSIS — K219 Gastro-esophageal reflux disease without esophagitis: Secondary | ICD-10-CM | POA: Diagnosis present

## 2014-05-30 DIAGNOSIS — Z79899 Other long term (current) drug therapy: Secondary | ICD-10-CM | POA: Diagnosis not present

## 2014-05-30 DIAGNOSIS — I701 Atherosclerosis of renal artery: Secondary | ICD-10-CM | POA: Diagnosis present

## 2014-05-30 DIAGNOSIS — R0602 Shortness of breath: Secondary | ICD-10-CM | POA: Diagnosis present

## 2014-05-30 DIAGNOSIS — Z7902 Long term (current) use of antithrombotics/antiplatelets: Secondary | ICD-10-CM

## 2014-05-30 DIAGNOSIS — I6522 Occlusion and stenosis of left carotid artery: Secondary | ICD-10-CM

## 2014-05-30 DIAGNOSIS — I714 Abdominal aortic aneurysm, without rupture, unspecified: Secondary | ICD-10-CM | POA: Diagnosis present

## 2014-05-30 DIAGNOSIS — I252 Old myocardial infarction: Secondary | ICD-10-CM

## 2014-05-30 DIAGNOSIS — Z87891 Personal history of nicotine dependence: Secondary | ICD-10-CM

## 2014-05-30 DIAGNOSIS — N182 Chronic kidney disease, stage 2 (mild): Secondary | ICD-10-CM | POA: Diagnosis present

## 2014-05-30 DIAGNOSIS — I129 Hypertensive chronic kidney disease with stage 1 through stage 4 chronic kidney disease, or unspecified chronic kidney disease: Secondary | ICD-10-CM | POA: Diagnosis present

## 2014-05-30 DIAGNOSIS — E213 Hyperparathyroidism, unspecified: Secondary | ICD-10-CM | POA: Diagnosis present

## 2014-05-30 DIAGNOSIS — I6529 Occlusion and stenosis of unspecified carotid artery: Secondary | ICD-10-CM | POA: Diagnosis present

## 2014-05-30 DIAGNOSIS — I10 Essential (primary) hypertension: Secondary | ICD-10-CM | POA: Diagnosis present

## 2014-05-30 LAB — BASIC METABOLIC PANEL
ANION GAP: 16 — AB (ref 5–15)
Anion Gap: 9 (ref 7–16)
BUN: 24 mg/dL — ABNORMAL HIGH (ref 6–23)
BUN: 26 mg/dL — AB (ref 7–18)
CO2: 22 mEq/L (ref 19–32)
Calcium, Total: 8.5 mg/dL (ref 8.5–10.1)
Calcium: 9.2 mg/dL (ref 8.4–10.5)
Chloride: 105 mEq/L (ref 96–112)
Chloride: 109 mmol/L — ABNORMAL HIGH (ref 98–107)
Co2: 24 mmol/L (ref 21–32)
Creatinine, Ser: 1.43 mg/dL — ABNORMAL HIGH (ref 0.50–1.10)
Creatinine: 1.73 mg/dL — ABNORMAL HIGH (ref 0.60–1.30)
EGFR (African American): 36 — ABNORMAL LOW
GFR CALC NON AF AMER: 30 — AB
GFR calc Af Amer: 38 mL/min — ABNORMAL LOW (ref >90)
GFR, EST NON AFRICAN AMERICAN: 33 mL/min — AB (ref >90)
GLUCOSE: 110 mg/dL — AB (ref 65–99)
Glucose, Bld: 105 mg/dL — ABNORMAL HIGH (ref 70–99)
OSMOLALITY: 289 (ref 275–301)
POTASSIUM: 4.3 meq/L (ref 3.7–5.3)
Potassium: 3.7 mmol/L (ref 3.5–5.1)
SODIUM: 143 meq/L (ref 137–147)
Sodium: 142 mmol/L (ref 136–145)

## 2014-05-30 LAB — CBC
HCT: 31.3 % — ABNORMAL LOW (ref 36.0–46.0)
Hemoglobin: 10.2 g/dL — ABNORMAL LOW (ref 12.0–15.0)
MCH: 29.1 pg (ref 26.0–34.0)
MCHC: 32.6 g/dL (ref 30.0–36.0)
MCV: 89.4 fL (ref 78.0–100.0)
PLATELETS: 199 10*3/uL (ref 150–400)
RBC: 3.5 MIL/uL — ABNORMAL LOW (ref 3.87–5.11)
RDW: 14.5 % (ref 11.5–15.5)
WBC: 9.2 10*3/uL (ref 4.0–10.5)

## 2014-05-30 LAB — TROPONIN I: TROPONIN-I: 0.28 ng/mL — AB

## 2014-05-30 LAB — GLUCOSE, CAPILLARY: GLUCOSE-CAPILLARY: 109 mg/dL — AB (ref 70–99)

## 2014-05-30 LAB — STREP PNEUMONIAE URINARY ANTIGEN: Strep Pneumo Urinary Antigen: NEGATIVE

## 2014-05-30 LAB — MRSA PCR SCREENING: MRSA by PCR: NEGATIVE

## 2014-05-30 LAB — PRO B NATRIURETIC PEPTIDE: B-TYPE NATIURETIC PEPTID: 739 pg/mL — AB (ref 0–450)

## 2014-05-30 MED ORDER — COLESEVELAM HCL 625 MG PO TABS
625.0000 mg | ORAL_TABLET | Freq: Three times a day (TID) | ORAL | Status: DC
  Administered 2014-05-30 (×3): 625 mg via ORAL
  Filled 2014-05-30 (×7): qty 1

## 2014-05-30 MED ORDER — METOPROLOL TARTRATE 12.5 MG HALF TABLET
12.5000 mg | ORAL_TABLET | Freq: Two times a day (BID) | ORAL | Status: DC
  Administered 2014-05-30 (×2): 12.5 mg via ORAL
  Filled 2014-05-30 (×4): qty 1

## 2014-05-30 MED ORDER — FEBUXOSTAT 40 MG PO TABS
80.0000 mg | ORAL_TABLET | Freq: Every day | ORAL | Status: DC
  Administered 2014-05-30 – 2014-06-02 (×4): 80 mg via ORAL
  Filled 2014-05-30 (×4): qty 2

## 2014-05-30 MED ORDER — ALBUTEROL SULFATE HFA 108 (90 BASE) MCG/ACT IN AERS: 2.0000 | INHALATION_SPRAY | Freq: Four times a day (QID) | RESPIRATORY_TRACT | Status: DC | PRN

## 2014-05-30 MED ORDER — LEVOTHYROXINE SODIUM 112 MCG PO TABS
112.0000 ug | ORAL_TABLET | Freq: Every day | ORAL | Status: DC
  Administered 2014-05-30 – 2014-05-31 (×2): 112 ug via ORAL
  Filled 2014-05-30 (×3): qty 1

## 2014-05-30 MED ORDER — ACETAMINOPHEN 325 MG PO TABS
650.0000 mg | ORAL_TABLET | Freq: Four times a day (QID) | ORAL | Status: DC | PRN
  Administered 2014-05-31 – 2014-06-01 (×2): 650 mg via ORAL
  Filled 2014-05-30 (×2): qty 2

## 2014-05-30 MED ORDER — ACETAMINOPHEN 650 MG RE SUPP: 650.0000 mg | Freq: Four times a day (QID) | RECTAL | Status: DC | PRN

## 2014-05-30 MED ORDER — AZITHROMYCIN 500 MG PO TABS
500.0000 mg | ORAL_TABLET | Freq: Every day | ORAL | Status: DC
  Administered 2014-05-30 – 2014-06-02 (×4): 500 mg via ORAL
  Filled 2014-05-30 (×4): qty 1

## 2014-05-30 MED ORDER — CALCIUM CARBONATE 1250 (500 CA) MG PO TABS
1.0000 | ORAL_TABLET | Freq: Every day | ORAL | Status: DC
  Administered 2014-05-31 – 2014-06-02 (×3): 500 mg via ORAL
  Filled 2014-05-30 (×5): qty 1

## 2014-05-30 MED ORDER — SODIUM CHLORIDE 0.9 % IV SOLN
INTRAVENOUS | Status: DC
  Administered 2014-05-30: 75 mL/h via INTRAVENOUS
  Administered 2014-05-30 – 2014-06-01 (×4): via INTRAVENOUS

## 2014-05-30 MED ORDER — CLOPIDOGREL BISULFATE 75 MG PO TABS
75.0000 mg | ORAL_TABLET | Freq: Every day | ORAL | Status: DC
  Administered 2014-05-30 – 2014-06-02 (×4): 75 mg via ORAL
  Filled 2014-05-30 (×4): qty 1

## 2014-05-30 MED ORDER — LEVOTHYROXINE SODIUM 112 MCG PO TABS
112.0000 ug | ORAL_TABLET | Freq: Every day | ORAL | Status: DC
  Filled 2014-05-30 (×2): qty 1

## 2014-05-30 MED ORDER — ONDANSETRON HCL 4 MG PO TABS: 4.0000 mg | ORAL_TABLET | Freq: Four times a day (QID) | ORAL | Status: DC | PRN

## 2014-05-30 MED ORDER — VITAMIN D3 25 MCG (1000 UNIT) PO TABS
3000.0000 [IU] | ORAL_TABLET | Freq: Every day | ORAL | Status: DC
  Administered 2014-05-30 – 2014-06-02 (×4): 3000 [IU] via ORAL
  Filled 2014-05-30 (×4): qty 3

## 2014-05-30 MED ORDER — ATORVASTATIN CALCIUM 80 MG PO TABS
80.0000 mg | ORAL_TABLET | Freq: Every day | ORAL | Status: DC
  Administered 2014-05-30 – 2014-06-02 (×4): 80 mg via ORAL
  Filled 2014-05-30 (×4): qty 1

## 2014-05-30 MED ORDER — CEFTRIAXONE SODIUM IN DEXTROSE 20 MG/ML IV SOLN
1.0000 g | INTRAVENOUS | Status: DC
  Administered 2014-05-30 – 2014-06-02 (×4): 1 g via INTRAVENOUS
  Filled 2014-05-30 (×5): qty 50

## 2014-05-30 MED ORDER — ONDANSETRON HCL 4 MG/2ML IJ SOLN: 4.0000 mg | Freq: Four times a day (QID) | INTRAMUSCULAR | Status: DC | PRN

## 2014-05-30 MED ORDER — LISINOPRIL 10 MG PO TABS
10.0000 mg | ORAL_TABLET | Freq: Every day | ORAL | Status: DC
  Administered 2014-05-30 – 2014-06-02 (×4): 10 mg via ORAL
  Filled 2014-05-30 (×4): qty 1

## 2014-05-30 MED ORDER — ESTRADIOL 0.1 MG/GM VA CREA
2.0000 g | TOPICAL_CREAM | VAGINAL | Status: DC
  Administered 2014-05-30: 0.25 via VAGINAL
  Filled 2014-05-30: qty 42.5

## 2014-05-30 MED ORDER — ALBUTEROL SULFATE (2.5 MG/3ML) 0.083% IN NEBU
2.5000 mg | INHALATION_SOLUTION | Freq: Four times a day (QID) | RESPIRATORY_TRACT | Status: DC | PRN
  Administered 2014-06-01: 2.5 mg via RESPIRATORY_TRACT
  Filled 2014-05-30: qty 3

## 2014-05-30 MED ORDER — GUAIFENESIN ER 600 MG PO TB12
600.0000 mg | ORAL_TABLET | Freq: Two times a day (BID) | ORAL | Status: DC
  Administered 2014-05-30 – 2014-06-02 (×7): 600 mg via ORAL
  Filled 2014-05-30 (×8): qty 1

## 2014-05-30 MED ORDER — ISOSORBIDE MONONITRATE ER 30 MG PO TB24
30.0000 mg | ORAL_TABLET | Freq: Two times a day (BID) | ORAL | Status: DC
  Administered 2014-05-30 – 2014-06-02 (×7): 30 mg via ORAL
  Filled 2014-05-30 (×9): qty 1

## 2014-05-30 MED ORDER — ASPIRIN EC 81 MG PO TBEC
81.0000 mg | DELAYED_RELEASE_TABLET | Freq: Every day | ORAL | Status: DC
  Administered 2014-05-30 – 2014-06-01 (×3): 81 mg via ORAL
  Filled 2014-05-30 (×4): qty 1

## 2014-05-30 MED ORDER — GUAIFENESIN-DM 100-10 MG/5ML PO SYRP
5.0000 mL | ORAL_SOLUTION | ORAL | Status: DC | PRN
  Administered 2014-06-01: 5 mL via ORAL
  Filled 2014-05-30 (×2): qty 5

## 2014-05-30 MED ORDER — HEPARIN SODIUM (PORCINE) 5000 UNIT/ML IJ SOLN
5000.0000 [IU] | Freq: Three times a day (TID) | INTRAMUSCULAR | Status: DC
  Administered 2014-05-30 – 2014-06-02 (×10): 5000 [IU] via SUBCUTANEOUS
  Filled 2014-05-30 (×11): qty 1

## 2014-05-30 NOTE — Consult Note (Signed)
Hospital Consult    Reason for Consult:  Known descending thoracic aneurysm Referring Physician:   MRN #:  [id number]  History of Present Illness: This is a 80 y.o. female who was transferred to [facility] [place] from [facility].  She has had SOB for about a week, which was greenish in color.  She was given a z-pak on 05/27/14 by PCP and did not get any relief from this.  She went to the ED and underwent workup.  At that time, she was found to have a dilated descending thoracic aorta with max AP diameter of 4.5cm.  She was also found to have a left PNA.    She was seen in our office in February 2015 by Dr. Oneida Alar for a known high grade right ICA stenosis.  At that time, her results were similar to the results a year prior.  In the past she was thought to be high risk for right CEA due to coronary artery disease.  She was not a candidate for carotid stenting due to marginal renal function.  At that time, she was asymptomatic.  She is on ASA and Plavix.  She is at very high risk for cardiac and renal complications.  A carotid intervention would only be considered if she becomes symptomatic.  She is to f/u in 1 year with repeat carotid duplex.  She also has PAD with hx of AAA at [facility], stenting of iliac artery and angioplasty of bilateral legs.  She lost kidney function on the right after her AAA repair.  Dr. Marval Regal follows her kidney function.  She has a hx of NSTEMI stent to OM in 1/07.  She was also found to hve 95% stenosis in a marginal branches and irregularities in teh LAD and a total occlusion of the RCA with normal EF.   Also was found to have mild AS.  She also has known chronic renal insufficiency and her creatinine has been around 1.5 for the past couple of years.  She has a hx of tobacco use, but quit in 2005-08-16.  She is on a beta blocker and ACEI for her hypertension.  She is on Welchol and Lipitor for her hypercholesterolemia.  She also has hypothyroidism and is on levothyroxine.    Past Medical  History  Diagnosis Date  . Aortic stenosis     Moderate; Echo 12/09. moderate AS mean 19. AVA 0.88  . PAD (peripheral artery disease)     s/p iliac stents  . HTN (hypertension)   . Hyperparathyroidism   . Hypothyroidism   . GERD (gastroesophageal reflux disease)   . Depression 08/16/04    With death of spouse  . Ventral hernia     Moderate sized  . CAD (coronary artery disease)     s/p NSTEMI 1/07 with BMS to OM; cath 12/09 due to positive Myoview, LM ok. LAD 40%. LCX 80-90% mid. RCA chronically occluded with R --> L collats  . AAA (abdominal aortic aneurysm)     Recurrent; s/p repair, now 3.8 cam; h/o ischemic bowel August 16, 2004  . Carotid artery stenosis     R 60-79% L 0-39% (6/10) - stable  . Hyperlipidemia     Increased LFTs on Vytorin. Changed to Lipitor.  Marland Kitchen CRI (chronic renal insufficiency)     Mild, Cr 1.3  . Renal artery stenosis     Atrophic L kidney. Moderate blockage on R  . Basal cell carcinoma of nose 1980s  . Incontinence   . Full dentures   .  Bruises easily   . Myocardial infarction 2007  . Vitamin D deficiency     Past Surgical History  Procedure Laterality Date  . Cholecystectomy  07/01/2005  . Breast biospy  1996  . Appendectomy  1954  . Tonsillectomy  1943  . Hernia repair  1997  . Coronary stent placement    . Breast surgery    . Abdominal aortic aneurysm repair  1995, 07/01/2005  . Angioplasty  2005 - 07/01/2005  . Iliac artery stent  2005 - 07/01/2005    Stent placement, both legs - PVD in arteries  . Colonoscopy  07/07/2005    Colonic erosions, ulcers proctitis  . Upper gastrointestinal endoscopy  07/07/2005    Nml  . Nstemi  08/13/2005    1/07 s/p stent in circumflex  . Colonoscopy w/ endoscopic [place]  07/01/2005    [place] kidney diffuse atrophy  . Cholecystectomy  1987  . Hosp  04/2005    ? abd. prob. mesenteric ischemia  . Coronary stent placement  07/2005    MI   . Hosp  01/2006    Hosp for reflux (chest pain)    Allergies  Allergen Reactions   . Fish Oil Other (See Comments)    Severe indigestion  . Naproxen Nausea And Vomiting  . Tramadol Nausea And Vomiting  . Codeine Nausea And Vomiting and Rash  . Morphine Sulfate Nausea And Vomiting and Rash  . Sulfonamide Derivatives Nausea And Vomiting and Rash    Prior to Admission medications   Medication Sig Start Date End Date Taking? Authorizing Provider  acetaminophen (TYLENOL) 500 MG tablet Take 1,000 mg by mouth 2 (two) times daily as needed for pain.    Historical Provider, MD  albuterol (PROVENTIL HFA;VENTOLIN HFA) 108 (90 BASE) MCG/ACT inhaler Inhale 2 puffs into the lungs every 6 (six) hours as needed for wheezing or shortness of breath. 05/27/14   Owens Loffler, MD  aspirin EC 81 MG tablet Take 81 mg by mouth at bedtime.    Historical Provider, MD  atorvastatin (LIPITOR) 80 MG tablet TAKE 1 TABLET DAILY 04/22/14   Amy Cletis Athens, MD  azithromycin (ZITHROMAX) 250 MG tablet 2 tabs po on day 1, then 1 tab po for 4 days 05/27/14   Owens Loffler, MD  Calcium Carbonate (CALTRATE 600) 1500 MG TABS Take 1 tablet by mouth daily.     Historical Provider, MD  Cholecalciferol 1000 UNITS capsule Take 3,000 Units by mouth daily.    Historical Provider, MD  clopidogrel (PLAVIX) 75 MG tablet TAKE 1 TABLET DAILY 05/19/14   Amy Cletis Athens, MD  colesevelam (WELCHOL) 625 MG tablet Take 1 tablet (625 mg total) by mouth 3 (three) times daily. 05/14/14   Minna Merritts, MD  estradiol (ESTRACE VAGINAL) 0.1 MG/GM vaginal cream Place 2 g vaginally 2 (two) times a week. Thursday and Sunday    Historical Provider, MD  Febuxostat (ULORIC) 80 MG TABS Take 80 mg by mouth daily.    Historical Provider, MD  isosorbide mononitrate (IMDUR) 30 MG 24 hr tablet Take 1 tablet (30 mg total) by mouth 2 (two) times daily. 02/02/13   Minna Merritts, MD  levothyroxine (SYNTHROID, LEVOTHROID) 112 MCG tablet TAKE 1 TABLET DAILY 08/24/13   Amy Cletis Athens, MD  lisinopril (PRINIVIL,ZESTRIL) 10 MG tablet Take 1 tablet (10 mg  total) by mouth daily. 04/24/14   Minna Merritts, MD  metoprolol tartrate (LOPRESSOR) 25 MG tablet Take one-half (1/2) tablet twice a day. 05/14/14  Minna Merritts, MD    History   Social History  . Marital Status: Widowed    Spouse Name: N/A    Number of Children: N/A  . Years of Education: N/A   Occupational History  . Not on file.   Social History Main Topics  . Smoking status: Former Smoker    Quit date: 07/12/2005  . Smokeless tobacco: Never Used     Comment: quit in 1997  . Alcohol Use: No  . Drug Use: No  . Sexual Activity: Not on file   Other Topics Concern  . Not on file   Social History Narrative   Husband deceased May 23, 2005 from multiple myeloma, lives alone near sister..    Has living will, HCPOA: daughter: [name]   Full code. Has lifealert. (Reviewed 2014)              Family History  Problem Relation Age of Onset  . Heart attack Mother     MI  . Cancer Mother   . Heart disease Mother     heart attack  . Stroke Mother   . Cancer Father     throat  . Heart attack Father     MI  . Throat cancer Father   . Heart disease Father     heart attack   . Stroke Father   . Cancer Sister     breast  . Breast cancer Sister   . Hyperlipidemia Sister   . Colon cancer Neg Hx   . Hyperlipidemia Sister   . Hypertension Sister   . Hyperlipidemia Son     ROS: [x] Positive   [ ] Negative   [ ] All sytems reviewed and are negative  Cardiovascular: [] chest pain/pressure [] palpitations [] SOB lying flat [x] SOB [] pain in legs while walking [] pain in legs at rest [] pain in legs at night [] non-healing ulcers [] hx of DVT [] swelling in legs  Pulmonary: [] productive cough [] asthma/wheezing [] home O2  Neurologic: [] weakness in [] arms [] legs [] numbness in [] arms [] legs [] hx of CVA [] mini stroke []difficulty speaking or slurred speech [] temporary loss of vision in one eye [] dizziness  Hematologic: [] hx of cancer []  bleeding problems [] problems with blood clotting easily  Endocrine:   [] diabetes [x] thyroid disease  GI [] vomiting blood [] blood in stool [x] GERD  GU: [x] CKD/renal failure [] HD--[] M/W/F or [] T/T/S [] burning with urination [] blood in urine  Psychiatric: [] anxiety [x] depression  Musculoskeletal: [] arthritis [] joint pain  Integumentary: [] rashes [] ulcers  Constitutional: [] fever [] chills   Physical Examination  Filed Vitals:   05/30/14 0700  BP: 122/64  Pulse: 80  Temp:   Resp: 19   Body mass index is 26.62 kg/(m^2).  General:  WDWN in NAD Gait: Not observed HENT: WNL, normocephalic Pulmonary: normal non-labored breathing, without Rales, rhonchi,  wheezing Cardiac: regular, without  Murmurs, rubs or gallops; without carotid bruits Abdomen: soft, NT/ND, no masses Skin: without rashes, without ulcers  Vascular Exam/Pulses:  Right Left  Radial 1+ (weak) 1+ (weak)  Ulnar absent absent  Femoral 2+ (normal) 2+ (normal)  Popliteal absent absent  DP 1+ (weak) 1+ (weak)  PT 1+ (weak) 1+ (weak)   Extremities: without ischemic changes, without Gangrene , without cellulitis; without open wounds;  Musculoskeletal: no muscle wasting or atrophy  Neurologic:  A&O X 3; Appropriate Affect ; SENSATION: normal; MOTOR FUNCTION:  moving all extremities equally. Speech is fluent/normal Psychiatric:  Normal based on situation Lymph:  No palpable groins or axilla   CBC    Component Value Date/Time   WBC 9.8 04/16/2013 2045   WBC 7.6 03/05/2011   RBC 4.66 04/16/2013 2045   HGB 14.1 04/16/2013 2045   HCT 41.0 04/16/2013 2045   PLT 173 04/16/2013 2045   MCV 88.0 04/16/2013 2045   MCH 30.3 04/16/2013 2045   MCHC 34.4 04/16/2013 2045   RDW 14.6 04/16/2013 2045   LYMPHSABS 2.9 07/03/2010 0833   MONOABS 0.6 07/03/2010 0833   EOSABS 0.2 07/03/2010 0833   BASOSABS 0.1 07/03/2010 0833    BMET    Component Value Date/Time   NA 143 11/06/2013 0854    NA 139 03/05/2011   K 4.4 11/06/2013 0854   CL 109 11/06/2013 0854   CO2 27 11/06/2013 0854   GLUCOSE 95 11/06/2013 0854   BUN 32* 11/06/2013 0854   BUN 26* 03/05/2011   CREATININE 1.5* 11/06/2013 0854   CREATININE 1.3* 03/05/2011   CREATININE 1.3* 03/05/2011   CALCIUM 9.7 11/06/2013 0854   GFRNONAA 37* 04/16/2013 2045   GFRAA 42* 04/16/2013 2045    COAGS: Lab Results  Component Value Date   INR 1.1 01/03/2009   INR 1.0 06/11/2008     Non-Invasive Vascular Imaging:   Most recent CT scan was performed at [facility] This is not available in the EPIC system.  CT scan results per H&P (unable to find official results in EPIC)  A CT of chest without contrast was done which showed dilated descending thoracic aorta with max AP diameter of 4.5 cm. Patchy airspace ds in left lung consistent with pneumonia was also found  CT 08/23/2011: Diffuse ectasia of the thoracic aorta is noted (ascending aorta 4.2 cm in diameter, mid arch 3.7 cm in diameter, and descending aorta of 4.2 cm in diameter.  Carotid being followed by Dr. Oneida Alar: 08/23/2013 Patient had a carotid duplex exam today which I reviewed and interpreted. This shows a greater than 80% right internal carotid artery stenosis with a high bifurcation. She follows up yearly.   Statin:  Yes.   Beta Blocker:  Yes.   Aspirin:  Yes.   ACEI:  Yes.   ARB:  No. Other antiplatelets/anticoagulants:  Yes.   Plavix   ASSESSMENT/PLAN: This is a 80 y.o. female  Community acquired Pneumonia Shortness of breath IV rocephin and azithromycin  Thoracic aorta aneurysm  Pending view of the CT from 05/28/2014 there does not seem to be a significant size change in the aneurysm size. Carotid Stenosis  She follows up yearly with Dr. Oneida Alar.      Laurence Slate [facility] PA-C [facility] [phone]

## 2014-05-30 NOTE — Progress Notes (Signed)
UR Completed.  Vergie Living [phone] 05/30/2014

## 2014-05-30 NOTE — H&P (Signed)
[facility] History and Physical  [name] BLT:[id number] DOB: 17-Sep-1927 DOA: 05/30/2014  Referring physician: sent from [facility] for vascular surgery evalaution PCP: Eliezer Lofts, MD   Chief Complaint:  Shortness of breath for past 1 week.   HPI:  80 y/o female with past medical hx of hypertension, moderate aortic stenosis, hypothyroidism, coronary artery disease with history of NST MI status post BMS, abdominal aortic aneurysm status post repair, dilated descending aorta, carotid artery stenosis ( >80% right,  currently under observation), hypertension, hyperlipidemia, presented with  Cough with greenish sputum for past 1 week associated with progressive SOB and chest congestion . Initially had sinus congestion with post nasal drip after she was out in the cold. No fever. Was given z pak on 11/16 by PCP without relief. Vitals in ED stable , afebrile with normal sat on RA with BP of 93/69. Blood work showed wbc on 12.3 K, Hb 11.7, Hct of 35.5 and plt of 201. CHEM 7 showed normal electrolytes with BUN of 26 and cr of 1.73 ( baseline around 1.5). CXR and EKG were unremarkable except for findings of enlarged aorta on CXR. Troponin was 0.28 with pro BNP of 759. A CT of chest without contrast was done which showed dilated descending thoracic aorta with max AP diameter of 4.5 cm. Patchy airspace ds in left lung consistent with pneumonia was also found. After discussing with vascular surgeon Dr Kellie Simmering pt was transferred [facility]. Given her low BP in the ED she was accepted to stepdown bed. She could not receive any contrast due to her CKD. ED discussed with vascualr sx who recommedned for TEE. ( they did not have access to CT chest from 2011-10-15 which showed  same size of descending thoracic aorta diameter). ED spoke with vascular surgery Dr Kellie Simmering and pt was accepted for transfer [facility] Patient denies headache, dizziness,vomiting, fever, chills, chest pain, palpitations,  SOB, abdominal pain, bowel or urinary symptoms. Reports poor appetite for past 1 week. No sick contacts. At baseline patient is very independent and active.  Review of Systems:  Constitutional: poor appetite and fatigue, Denies fever, chills, diaphoresis,  HEENT: Nasal Congestion, rhinorrhea, sneezing, Denies visual or hearing symptoms, ear pain, sore throat  trouble swallowing, neck pain Respiratory: Dyspnea on exertion, cough greenish expectoration, chest tightness,  Denies  wheezing.   Cardiovascular: Denies chest pain, palpitations and leg swelling.  Gastrointestinal: Denies nausea, vomiting, abdominal pain, diarrhea,  Genitourinary: Denies dysuria,hematuria, flank pain and difficulty urinating.  Endocrine: Denies: hot or cold intolerance, polyuria, polydipsia. Musculoskeletal: Denies myalgias, back pain, joint pain Skin: Denies rash and wound.  Neurological:  Weakness, Denies dizziness, seizures, syncope,  light-headedness, numbness and headaches.  Psychiatric/Behavioral: Denies  confusion,    Past Medical History  Diagnosis Date  . Aortic stenosis     Moderate; Echo 12/09. moderate AS mean 19. AVA 0.88  . PAD (peripheral artery disease)     s/p iliac stents  . HTN (hypertension)   . Hyperparathyroidism   . Hypothyroidism   . GERD (gastroesophageal reflux disease)   . Depression 10-14-04    With death of spouse  . Ventral hernia     Moderate sized  . CAD (coronary artery disease)     s/p NSTEMI 1/07 with BMS to OM; cath 12/09 due to positive Myoview, LM ok. LAD 40%. LCX 80-90% mid. RCA chronically occluded with R --> L collats  . AAA (abdominal aortic aneurysm)     Recurrent; s/p repair, now  3.8 cam; h/o ischemic bowel 2006  . Carotid artery stenosis     R 60-79% L 0-39% (6/10) - stable  . Hyperlipidemia     Increased LFTs on Vytorin. Changed to Lipitor.  Marland Kitchen CRI (chronic renal insufficiency)     Mild, Cr 1.3  . Renal artery stenosis     Atrophic L kidney. Moderate blockage on  R  . Basal cell carcinoma of nose 1980s  . Incontinence   . Full dentures   . Bruises easily   . Myocardial infarction 2007  . Vitamin D deficiency    Past Surgical History  Procedure Laterality Date  . Cholecystectomy  07/01/2005  . Breast biospy  1996  . Appendectomy  1954  . Tonsillectomy  1943  . Hernia repair  1997  . Coronary stent placement    . Breast surgery    . Abdominal aortic aneurysm repair  1995, 07/01/2005  . Angioplasty  2005 - 07/01/2005  . Iliac artery stent  2005 - 07/01/2005    Stent placement, both legs - PVD in arteries  . Colonoscopy  07/07/2005    Colonic erosions, ulcers proctitis  . Upper gastrointestinal endoscopy  07/07/2005    Nml  . Nstemi  08/13/2005    1/07 s/p stent in circumflex  . Colonoscopy w/ endoscopic [place]  07/01/2005    [place] I kidney diffuse atrophy  . Cholecystectomy  1987  . Hosp  04/2005    ? abd. prob. mesenteric ischemia  . Coronary stent placement  07/2005    MI   . [place]  01/2006    Hosp for reflux (chest pain)   Social History:  reports that she quit smoking about 8 years ago. She has never used smokeless tobacco. She reports that she does not drink alcohol or use illicit drugs.  Allergies  Allergen Reactions  . Fish Oil Other (See Comments)    Severe indigestion  . Naproxen Nausea And Vomiting  . Tramadol Nausea And Vomiting  . Codeine Nausea And Vomiting and Rash  . Morphine Sulfate Nausea And Vomiting and Rash  . Sulfonamide Derivatives Nausea And Vomiting and Rash    Family History  Problem Relation Age of Onset  . Heart attack Mother     MI  . Cancer Mother   . Heart disease Mother     heart attack  . Stroke Mother   . Cancer Father     throat  . Heart attack Father     MI  . Throat cancer Father   . Heart disease Father     heart attack   . Stroke Father   . Cancer Sister     breast  . Breast cancer Sister   . Hyperlipidemia Sister   . Colon cancer Neg Hx   . Hyperlipidemia Sister   .  Hypertension Sister   . Hyperlipidemia Son     Prior to Admission medications   Medication Sig Start Date End Date Taking? Authorizing Provider  acetaminophen (TYLENOL) 500 MG tablet Take 1,000 mg by mouth 2 (two) times daily as needed for pain.    Historical Provider, MD  albuterol (PROVENTIL HFA;VENTOLIN HFA) 108 (90 BASE) MCG/ACT inhaler Inhale 2 puffs into the lungs every 6 (six) hours as needed for wheezing or shortness of breath. 05/27/14   Owens Loffler, MD  aspirin EC 81 MG tablet Take 81 mg by mouth at bedtime.    Historical Provider, MD  atorvastatin (LIPITOR) 80 MG tablet TAKE 1 TABLET DAILY 04/22/14  Jinny Sanders, MD  azithromycin (ZITHROMAX) 250 MG tablet 2 tabs po on day 1, then 1 tab po for 4 days 05/27/14   Owens Loffler, MD  Calcium Carbonate (CALTRATE 600) 1500 MG TABS Take 1 tablet by mouth daily.     Historical Provider, MD  Cholecalciferol 1000 UNITS capsule Take 3,000 Units by mouth daily.    Historical Provider, MD  clopidogrel (PLAVIX) 75 MG tablet TAKE 1 TABLET DAILY 05/19/14   Amy Cletis Athens, MD  colesevelam (WELCHOL) 625 MG tablet Take 1 tablet (625 mg total) by mouth 3 (three) times daily. 05/14/14   Minna Merritts, MD  estradiol (ESTRACE VAGINAL) 0.1 MG/GM vaginal cream Place 2 g vaginally 2 (two) times a week. Thursday and Sunday    Historical Provider, MD  Febuxostat (ULORIC) 80 MG TABS Take 80 mg by mouth daily.    Historical Provider, MD  isosorbide mononitrate (IMDUR) 30 MG 24 hr tablet Take 1 tablet (30 mg total) by mouth 2 (two) times daily. 02/02/13   Minna Merritts, MD  levothyroxine (SYNTHROID, LEVOTHROID) 112 MCG tablet TAKE 1 TABLET DAILY 08/24/13   Amy Cletis Athens, MD  lisinopril (PRINIVIL,ZESTRIL) 10 MG tablet Take 1 tablet (10 mg total) by mouth daily. 04/24/14   Minna Merritts, MD  metoprolol tartrate (LOPRESSOR) 25 MG tablet Take one-half (1/2) tablet twice a day. 05/14/14   Minna Merritts, MD     Physical Exam:  Filed Vitals:   05/30/14  0630 05/30/14 0700  BP: 136/75 122/64  Pulse: 86 80  Temp: 98.7 F (37.1 C)   TempSrc: Oral   Resp: 15 19  Height: 5\' 7"  (1.702 m)   Weight: 77.1 kg (169 lb 15.6 oz)   SpO2: 99% 97%    Constitutional: Vital signs reviewed. Elderly female in no acute distress. HEENT: no pallor,  moist oral mucosa, no cervical lymphadenopathy Cardiovascular: RRR, S1 normal, S2 normal, no MRG Chest: equal air entry b/l left basilar wheeze, no crackles Abdominal: Soft. Non-tender, non-distended, bowel sounds are normal,  Ext: warm, no edema Neurological: A&O x3, non focal  Labs and imaging results from [facility] as outlined in HPI   EKG: NSR @93 , no ST-T changes  Assessment/Plan Principal Problem:   Community acquired pneumonia   Active Problems:    Essential hypertension   Coronary atherosclerosis   Carotid stenosis   Abdominal aortic aneurysm   GERD   Chronic kidney disease (CKD), stage II (mild)     Community acquired pneumonia Admit to stepdown.  continue IV rocephin and azithromycin. Follow blood cx. Sputum cx, urine for strep and legionella.  02 via  prn. Continue antitussives , IV fluids and tylenol for supportive care.  Transfer to med surg   Dilated Descending aorta This is not a new finding. She had CT of chest in 2013 which also showed ascending aorta of 3.7 cm , mid arch of 3.7 cm and descending aorta of 4.2 cm. Pt is asymptomatic from this . Will follow vascular sx evaluation   Right carotid artery stenosis  80-99% stenosis per last carotid US. Reportedly not a candidate for stent due to? Intolerant to anesthesia. Follows with Dr Oneida Alar.  Essential hypertension Blood pressure stable. Continue home medications  Coronary artery disease  stable. Continue aspirin, Plavix, statin and metoprolol  moderate aortic stenosis I'll O's with her cardiologist as outpatient  Chronic Kidney disease stage II Mildly worsened upon presentation in the ED. Improved with IV  fluids  Hypothyroidism Continue Synthroid  Diet:cardiac  DVT prophylaxis: sq heparin   Code Status: full code Family Communication: discussed with patient her son and daughter at bedside Disposition Plan: Admitted to stepdown. Transfer to medical floor this afternoon  Louellen Molder [facility] Pager [phone]  Total time spent on admission :70 minutes  If 7PM-7AM, please contact night-coverage [email or web address] Password [email or web address] 05/30/2014, 7:31 AM

## 2014-05-31 ENCOUNTER — Encounter (HOSPITAL_COMMUNITY): Payer: Self-pay | Admitting: *Deleted

## 2014-05-31 DIAGNOSIS — I10 Essential (primary) hypertension: Secondary | ICD-10-CM

## 2014-05-31 DIAGNOSIS — I714 Abdominal aortic aneurysm, without rupture: Secondary | ICD-10-CM

## 2014-05-31 LAB — LEGIONELLA ANTIGEN, URINE

## 2014-05-31 LAB — HIV ANTIBODY (ROUTINE TESTING W REFLEX): HIV 1&2 Ab, 4th Generation: NONREACTIVE

## 2014-05-31 MED ORDER — METOPROLOL TARTRATE 12.5 MG HALF TABLET
12.5000 mg | ORAL_TABLET | Freq: Two times a day (BID) | ORAL | Status: DC
  Administered 2014-05-31 – 2014-06-02 (×5): 12.5 mg via ORAL
  Filled 2014-05-31 (×6): qty 1

## 2014-05-31 MED ORDER — LEVOTHYROXINE SODIUM 112 MCG PO TABS
112.0000 ug | ORAL_TABLET | Freq: Every day | ORAL | Status: DC
  Administered 2014-06-01: 112 ug via ORAL
  Filled 2014-05-31 (×3): qty 1

## 2014-05-31 MED ORDER — COLESEVELAM HCL 625 MG PO TABS
625.0000 mg | ORAL_TABLET | Freq: Three times a day (TID) | ORAL | Status: DC
  Administered 2014-05-31 – 2014-06-02 (×6): 625 mg via ORAL
  Filled 2014-05-31 (×8): qty 1

## 2014-05-31 NOTE — Progress Notes (Signed)
Patient admitted to room 6n30 from 47m  A/0 VSS oriented to room

## 2014-05-31 NOTE — Progress Notes (Signed)
[facility] TEAM 1 - Stepdown/ICU TEAM Progress Note  [name] IRC:[id number] DOB: Jan 14, 1928 DOA: 05/30/2014 PCP: Eliezer Lofts, MD  Admit HPI / Brief Narrative: 80 y/o female with past medical hx of hypertension, moderate aortic stenosis, hypothyroidism, coronary artery disease with history of NST MI status post BMS, abdominal aortic aneurysm status post repair, dilated descending aorta, carotid artery stenosis ( >80% right, currently under observation), hypertension, hyperlipidemia, presented with Cough with greenish sputum for past 1 week associated with progressive SOB and chest congestion. Was given z pak on 11/16 by PCP without relief. Vitals in ED stable , afebrile with normal sat on RA with BP of 93/69.  Blood work showed wbc of12.3 K, Hb 11.7, Hct of 35.5 and plt of 201. CHEM 7 showed normal electrolytes with BUN of 26 and cr of 1.73 ( baseline around 1.5). CXR and EKG were unremarkable except for findings of enlarged aorta on CXR. Troponin was 0.28 with pro BNP of 759.  A CT of chest without contrast was done which showed dilated descending thoracic aorta with max AP diameter of 4.5 cm. Patchy airspace ds in left lung consistent with pneumonia was also found. After discussing with vascular surgeon Dr Kellie Simmering pt was transferred [facility].   11/20: Patient is improved and continues to have cough.  She is concerned about her medications.  VAscular surgery has seen the patient  HPI/Subjective: Main issue today is that patient is upset that her medications are not given to her the way they are at home.  She continues to have a cough but is feeling much better compared to admission.   Assessment/Plan: CAP - Initially admitted to SDU b/c of low blood pressure, but improved with therapy and transitioned to floor bed.  - On IV rocephin and azithromycin  - Consider transition to oral medications in AM (she failed outpatient zpack) - O2 as needed, wean off -  Antitussives - IVF while NPO, advance diet and wean fluids as tolerated for BP  Carotid Stenosis/PVD/CAD/dilated descending aorta - Seen by Vascular surgery, unchanged from previous - Plavix and statin continued  HTN - Improved, home medications started - lisinopril, metoprolol, ISMO - She had some bradycardia (high 50s) on metoprolol, set hold parameters  Hypothyroidism - Continue levothyroxine  Code Status: FULL Family Communication: no family present at time of exam Disposition Plan: Transfer to med surg if continues to be well    Consultants: Vascular surgery  Procedure/Significant Events:  Culture BCX2 pending  Antibiotics: Rocephin Azithromycin  DVT prophylaxis: heparin   LINES / TUBES:  PIV    Continuous Infusions: . sodium chloride 75 mL/hr (05/30/14 2151)    Objective: VITAL SIGNS: Temp: 98.5 F (36.9 C) (11/20 0742) Temp Source: Oral (11/20 0742) BP: 124/103 mmHg (11/20 0700) Pulse Rate: 58 (11/20 0700)    Intake/Output Summary (Last 24 hours) at 05/31/14 0848 Last data filed at 05/31/14 0600  Gross per 24 hour  Intake 1886.25 ml  Output   1400 ml  Net 486.25 ml     Exam: General: No acute respiratory distress, elderly woman, speaking in full sentences off of [place] oxygen Lungs: Some mild diffuse wheezing, no crackles Cardiovascular: Regular rate and rhythm without murmur gallop or rub Abdomen: Nontender, nondistended, soft, bowel sounds positive, no rebound Extremities: warm, well perfused, no edema  Data Reviewed: Basic Metabolic Panel:  Recent Labs Lab 05/30/14 0850  NA 143  K 4.3  CL 105  CO2 22  GLUCOSE 105*  BUN 24*  CREATININE 1.43*  CALCIUM 9.2   Liver Function Tests: No results for input(s): AST, ALT, ALKPHOS, BILITOT, PROT, ALBUMIN in the last 168 hours. No results for input(s): LIPASE, AMYLASE in the last 168 hours. No results for input(s): AMMONIA in the last 168 hours. CBC:  Recent Labs Lab 05/30/14 0850   WBC 9.2  HGB 10.2*  HCT 31.3*  MCV 89.4  PLT 199   Cardiac Enzymes: No results for input(s): CKTOTAL, CKMB, CKMBINDEX, TROPONINI in the last 168 hours. BNP (last 3 results) No results for input(s): PROBNP in the last 8760 hours. CBG:  Recent Labs Lab 05/30/14 0649  GLUCAP 109*    Recent Results (from the past 240 hour(s))  MRSA PCR Screening     Status: None   Collection Time: 05/30/14  6:28 AM  Result Value Ref Range Status   MRSA by PCR NEGATIVE NEGATIVE Final    Comment:        The GeneXpert MRSA Assay (FDA approved for NASAL specimens only), is one component of a comprehensive MRSA colonization surveillance program. It is not intended to diagnose MRSA infection nor to guide or monitor treatment for MRSA infections.   Culture, blood (routine x 2) Call MD if unable to obtain prior to antibiotics being given     Status: None (Preliminary result)   Collection Time: 05/30/14  8:35 AM  Result Value Ref Range Status   Specimen Description BLOOD RIGHT HAND  Final   Special Requests BOTTLES DRAWN AEROBIC ONLY 10CC  Final   Culture  Setup Time   Final    05/30/2014 13:25 Performed at [facility]    Culture   Final           BLOOD CULTURE RECEIVED NO GROWTH TO DATE CULTURE WILL BE HELD FOR 5 DAYS BEFORE ISSUING A FINAL NEGATIVE REPORT Performed at [facility]    Report Status PENDING  Incomplete  Culture, blood (routine x 2) Call MD if unable to obtain prior to antibiotics being given     Status: None (Preliminary result)   Collection Time: 05/30/14  8:50 AM  Result Value Ref Range Status   Specimen Description BLOOD LEFT HAND  Final   Special Requests BOTTLES DRAWN AEROBIC ONLY 10CC  Final   Culture  Setup Time   Final    05/30/2014 13:24 Performed at [facility]    Culture   Final           BLOOD CULTURE RECEIVED NO GROWTH TO DATE CULTURE WILL BE HELD FOR 5 DAYS BEFORE ISSUING A FINAL NEGATIVE REPORT Performed at [facility]    Report Status PENDING  Incomplete     Studies:  Recent x-ray studies have been reviewed in detail by the Attending Physician  Scheduled Meds:  Scheduled Meds: . aspirin EC  81 mg Oral QHS  . atorvastatin  80 mg Oral Daily  . azithromycin  500 mg Oral Daily  . calcium carbonate  1 tablet Oral Q breakfast  . cefTRIAXone (ROCEPHIN)  IV  1 g Intravenous Q24H  . cholecalciferol  3,000 Units Oral Daily  . clopidogrel  75 mg Oral Daily  . colesevelam  625 mg Oral TID WC  . estradiol  2 g Vaginal Once per day on Sun Thu  . febuxostat  80 mg Oral Daily  . guaiFENesin  600 mg Oral BID  . heparin  5,000 Units Subcutaneous 3 times per day  . isosorbide mononitrate  30 mg Oral BID WC  . [START ON 06/01/2014] levothyroxine  112 mcg Oral QAC breakfast  . lisinopril  10 mg Oral Daily  . metoprolol tartrate  12.5 mg Oral BID    Time spent on care of this patient: 40 mins   Gilles Chiquito , MD   [facility] Office  [phone] Pager - [phone]  On-Call/Text Page:      [email or web address]      password [email or web address]  If 7PM-7AM, please contact night-coverage [email or web address] Password [email or web address] 05/31/2014, 8:48 AM   LOS: 1 day

## 2014-06-01 DIAGNOSIS — I2510 Atherosclerotic heart disease of native coronary artery without angina pectoris: Secondary | ICD-10-CM

## 2014-06-01 DIAGNOSIS — I6523 Occlusion and stenosis of bilateral carotid arteries: Secondary | ICD-10-CM

## 2014-06-01 LAB — EXPECTORATED SPUTUM ASSESSMENT W REFEX TO RESP CULTURE

## 2014-06-01 NOTE — Plan of Care (Signed)
Problem: Phase II Progression Outcomes Goal: Progress activity as tolerated unless otherwise ordered Outcome: Completed/Met Date Met:  06/01/14 Up all day

## 2014-06-01 NOTE — Plan of Care (Signed)
Problem: Phase I Progression Outcomes Goal: OOB as tolerated unless otherwise ordered Outcome: Completed/Met Date Met:  06/01/14 Walking in room and hallways

## 2014-06-01 NOTE — Plan of Care (Signed)
Problem: Phase II Progression Outcomes Goal: Pain controlled Outcome: Completed/Met Date Met:  06/01/14 No complaints of pain this shift

## 2014-06-01 NOTE — Plan of Care (Signed)
Problem: Phase I Progression Outcomes Goal: Pain controlled with appropriate interventions Outcome: Completed/Met Date Met:  06/01/14 No c/o pain this shift

## 2014-06-01 NOTE — Progress Notes (Signed)
PATIENT DETAILS Name: GENEVE KIMPEL Age: 80 y.o. Sex: female Date of Birth: 07/31/1927 Admit Date: 05/30/2014 Admitting Physician Berle Mull, MD PCP:Amy Diona Browner, MD  Subjective: Feels significantly better, only complaint is cough.  Assessment/Plan: Principal Problem:   Community acquired pneumonia:Admitted and started on IV Rocephin/Zithromax. HIV neg, Urine Legionella/Streptococcal negative. Significantly improved, all cultures neg so far. Suspect home on 11/22 with Levaquin.  Active Problems:  Carotid Stenosis/PVD/CAD/dilated descending aorta: Seen by Vascular surgery, unchanged from previous. Plavix and statin continued    Essential hypertension:BP controlled.Continue Imdur,Lisinopril and Metoprolol    CAD s/p UYQ:[id number] ASA/Plavix,Statin and Beta Blocker. No chest pain/SOB    Hx of Carotid stenosis:follows outpatient with Dr Oneida Alar, review of outpatient [facility] notes-medical management.     Hypothyroidism:c/w Levothyroxine    Chronic kidney disease (CKD), stage II (mild):per outpatient notes-has solitary kidney-creatinine currently close to baseline.  Disposition: Remain inpatient-home 11/21  Antibiotics:  See below   Anti-infectives    Start     Dose/Rate Route Frequency Ordered Stop   05/30/14 0800  cefTRIAXone (ROCEPHIN) 1 g in dextrose 5 % 50 mL IVPB - Premix     1 g100 mL/hr over 30 Minutes Intravenous Every 24 hours 05/30/14 0725 06/06/14 0759   05/30/14 0800  azithromycin (ZITHROMAX) tablet 500 mg     500 mg Oral Daily 05/30/14 0725 06/06/14 0959     DVT Prophylaxis: Prophylactic  Heparin  Code Status: Full code  Family Communication None at bedside  Procedures:  None  CONSULTS:  None  MEDICATIONS: Scheduled Meds: . aspirin EC  81 mg Oral QHS  . atorvastatin  80 mg Oral Daily  . azithromycin  500 mg Oral Daily  . calcium carbonate  1 tablet Oral Q breakfast  . cefTRIAXone (ROCEPHIN)  IV  1 g Intravenous Q24H  . cholecalciferol   3,000 Units Oral Daily  . clopidogrel  75 mg Oral Daily  . colesevelam  625 mg Oral TID WC  . estradiol  2 g Vaginal Once per day on Sun Thu  . febuxostat  80 mg Oral Daily  . guaiFENesin  600 mg Oral BID  . heparin  5,000 Units Subcutaneous 3 times per day  . isosorbide mononitrate  30 mg Oral BID WC  . levothyroxine  112 mcg Oral QAC breakfast  . lisinopril  10 mg Oral Daily  . metoprolol tartrate  12.5 mg Oral BID   Continuous Infusions:  PRN Meds:.acetaminophen **OR** acetaminophen, albuterol, guaiFENesin-dextromethorphan, ondansetron **OR** ondansetron (ZOFRAN) IV    PHYSICAL EXAM: Vital signs in last 24 hours: Filed Vitals:   06/01/14 0544 06/01/14 0741 06/01/14 0927 06/01/14 0933  BP: 130/69 114/54 113/70 113/70  Pulse: 59 60  83  Temp: 97.4 F (36.3 C) 97.7 F (36.5 C)    TempSrc: Oral Oral    Resp: 16 18    Height:      Weight:      SpO2: 93% 94%      Weight change:  Filed Weights   05/30/14 0630  Weight: 77.1 kg (169 lb 15.6 oz)   Body mass index is 26.62 kg/(m^2).   Gen Exam: Awake and alert with clear speech.   Neck: Supple, No JVD.   Chest: B/L Clear.   CVS: S1 S2 Regular, no murmurs.  Abdomen: soft, BS +, non tender, non distended.  Extremities: no edema, lower extremities warm to touch. Neurologic: Non Focal.   Skin: No Rash.   Wounds: N/A.  Intake/Output from previous day:  Intake/Output Summary (Last 24 hours) at 06/01/14 1154 Last data filed at 06/01/14 1000  Gross per 24 hour  Intake   2275 ml  Output    600 ml  Net   1675 ml     LAB RESULTS: CBC  Recent Labs Lab 05/30/14 0850  WBC 9.2  HGB 10.2*  HCT 31.3*  PLT 199  MCV 89.4  MCH 29.1  MCHC 32.6  RDW 14.5    Chemistries   Recent Labs Lab 05/30/14 0850  NA 143  K 4.3  CL 105  CO2 22  GLUCOSE 105*  BUN 24*  CREATININE 1.43*  CALCIUM 9.2    CBG:  Recent Labs Lab 05/30/14 0649  GLUCAP 109*    GFR Estimated Creatinine Clearance: 31.3 mL/min (by C-G  formula based on Cr of 1.43).  Coagulation profile No results for input(s): INR, PROTIME in the last 168 hours.  Cardiac Enzymes No results for input(s): CKMB, TROPONINI, MYOGLOBIN in the last 168 hours.  Invalid input(s): CK  Invalid input(s): POCBNP No results for input(s): DDIMER in the last 72 hours. No results for input(s): HGBA1C in the last 72 hours. No results for input(s): CHOL, HDL, LDLCALC, TRIG, CHOLHDL, LDLDIRECT in the last 72 hours. No results for input(s): TSH, T4TOTAL, T3FREE, THYROIDAB in the last 72 hours.  Invalid input(s): FREET3 No results for input(s): VITAMINB12, FOLATE, FERRITIN, TIBC, IRON, RETICCTPCT in the last 72 hours. No results for input(s): LIPASE, AMYLASE in the last 72 hours.  Urine Studies No results for input(s): UHGB, CRYS in the last 72 hours.  Invalid input(s): UACOL, UAPR, USPG, UPH, UTP, UGL, UKET, UBIL, UNIT, UROB, ULEU, UEPI, UWBC, URBC, UBAC, CAST, UCOM, BILUA  MICROBIOLOGY: Recent Results (from the past 240 hour(s))  MRSA PCR Screening     Status: None   Collection Time: 05/30/14  6:28 AM  Result Value Ref Range Status   MRSA by PCR NEGATIVE NEGATIVE Final    Comment:        The GeneXpert MRSA Assay (FDA approved for NASAL specimens only), is one component of a comprehensive MRSA colonization surveillance program. It is not intended to diagnose MRSA infection nor to guide or monitor treatment for MRSA infections.   Culture, blood (routine x 2) Call MD if unable to obtain prior to antibiotics being given     Status: None (Preliminary result)   Collection Time: 05/30/14  8:35 AM  Result Value Ref Range Status   Specimen Description BLOOD RIGHT HAND  Final   Special Requests BOTTLES DRAWN AEROBIC ONLY 10CC  Final   Culture  Setup Time   Final    05/30/2014 13:25 Performed at [facility]    Culture   Final           BLOOD CULTURE RECEIVED NO GROWTH TO DATE CULTURE WILL BE HELD FOR 5 DAYS BEFORE ISSUING A FINAL  NEGATIVE REPORT Performed at [facility]    Report Status PENDING  Incomplete  Culture, blood (routine x 2) Call MD if unable to obtain prior to antibiotics being given     Status: None (Preliminary result)   Collection Time: 05/30/14  8:50 AM  Result Value Ref Range Status   Specimen Description BLOOD LEFT HAND  Final   Special Requests BOTTLES DRAWN AEROBIC ONLY 10CC  Final   Culture  Setup Time   Final    05/30/2014 13:24 Performed at [facility]   Final  BLOOD CULTURE RECEIVED NO GROWTH TO DATE CULTURE WILL BE HELD FOR 5 DAYS BEFORE ISSUING A FINAL NEGATIVE REPORT Performed at [facility]    Report Status PENDING  Incomplete    RADIOLOGY STUDIES/RESULTS: No results found.  Oren Binet, MD  [facility] Pager:[phone]  If 7PM-7AM, please contact night-coverage [email or web address] Password [email or web address] 06/01/2014, 11:54 AM   LOS: 2 days

## 2014-06-01 NOTE — Plan of Care (Signed)
Problem: Phase II Progression Outcomes Goal: Tolerating diet Outcome: Completed/Met Date Met:  06/01/14 Tolerating diet well

## 2014-06-01 NOTE — Plan of Care (Signed)
Problem: Phase I Progression Outcomes Goal: Voiding-avoid urinary catheter unless indicated Outcome: Not Applicable Date Met:  06/01/14     

## 2014-06-01 NOTE — Plan of Care (Signed)
Problem: Phase II Progression Outcomes Goal: Wean O2 if indicated Outcome: Completed/Met Date Met:  06/01/14

## 2014-06-01 NOTE — Plan of Care (Signed)
Problem: Phase I Progression Outcomes Goal: Dyspnea controlled at rest Outcome: Completed/Met Date Met:  06/01/14 No respiratory distress noted even when walking.

## 2014-06-02 MED ORDER — LEVOFLOXACIN 750 MG PO TABS: 750.0000 mg | ORAL_TABLET | ORAL | Status: DC

## 2014-06-02 MED ORDER — GUAIFENESIN ER 600 MG PO TB12: 600.0000 mg | ORAL_TABLET | Freq: Two times a day (BID) | ORAL | Status: DC

## 2014-06-02 MED ORDER — BENZONATATE 200 MG PO CAPS: 200.0000 mg | ORAL_CAPSULE | Freq: Three times a day (TID) | ORAL | Status: DC | PRN

## 2014-06-02 MED ORDER — ALBUTEROL SULFATE HFA 108 (90 BASE) MCG/ACT IN AERS: 2.0000 | INHALATION_SPRAY | RESPIRATORY_TRACT | Status: DC | PRN

## 2014-06-02 NOTE — Discharge Summary (Addendum)
PATIENT DETAILS Name: [name] Age: 80 y.o. Sex: female Date of Birth: [birth year] MRN: [id number]. Admitting Physician: Berle Mull, MD McGrath, MD  Admit Date: 05/30/2014 Discharge date: 06/02/2014  Recommendations for Outpatient Follow-up:  1. Please repeat Chest Xray in 4-6 weeks to ensure resolution on PNA 2. Monitor renal function closely 3. Please ensure patient has follow up with Vascular Surgery  PRIMARY DISCHARGE DIAGNOSIS:  Principal Problem:   Community acquired pneumonia Active Problems:   Essential hypertension   Coronary atherosclerosis   Carotid stenosis   Abdominal aortic aneurysm   GERD   Chronic kidney disease (CKD), stage II (mild)   Descending thoracic aortic aneurysm      PAST MEDICAL HISTORY: Past Medical History  Diagnosis Date  . Aortic stenosis     Moderate; Echo 12/09. moderate AS mean 19. AVA 0.88  . PAD (peripheral artery disease)     s/p iliac stents  . HTN (hypertension)   . Hyperparathyroidism   . Hypothyroidism   . GERD (gastroesophageal reflux disease)   . Depression 2004/10/28    With death of spouse  . Ventral hernia     Moderate sized  . CAD (coronary artery disease)     s/p NSTEMI 1/07 with BMS to OM; cath 12/09 due to positive Myoview, LM ok. LAD 40%. LCX 80-90% mid. RCA chronically occluded with R --> L collats  . AAA (abdominal aortic aneurysm)     Recurrent; s/p repair, now 3.8 cam; h/o ischemic bowel 10/28/2004  . Carotid artery stenosis     R 60-79% L 0-39% (6/10) - stable  . Hyperlipidemia     Increased LFTs on Vytorin. Changed to Lipitor.  Marland Kitchen CRI (chronic renal insufficiency)     Mild, Cr 1.3  . Renal artery stenosis     Atrophic L kidney. Moderate blockage on R  . Basal cell carcinoma of nose 1980s  . Incontinence   . Full dentures   . Bruises easily   . Myocardial infarction Oct 28, 2005  . Vitamin D deficiency     DISCHARGE MEDICATIONS: Current Discharge Medication List    START taking these medications     Details  benzonatate (TESSALON) 200 MG capsule Take 1 capsule (200 mg total) by mouth 3 (three) times daily as needed for cough. Qty: 20 capsule, Refills: 0    guaiFENesin (MUCINEX) 600 MG 12 hr tablet Take 1 tablet (600 mg total) by mouth 2 (two) times daily. Qty: 15 tablet, Refills: 0    levofloxacin (LEVAQUIN) 750 MG tablet Take 1 tablet (750 mg total) by mouth every other day. Qty: 2 tablet, Refills: 0      CONTINUE these medications which have CHANGED   Details  albuterol (PROVENTIL HFA;VENTOLIN HFA) 108 (90 BASE) MCG/ACT inhaler Inhale 2 puffs into the lungs every 4 (four) hours as needed for wheezing or shortness of breath. Qty: 1 Inhaler, Refills: 0      CONTINUE these medications which have NOT CHANGED   Details  acetaminophen (TYLENOL) 500 MG tablet Take 1,000 mg by mouth 2 (two) times daily as needed for pain.    aspirin EC 81 MG tablet Take 81 mg by mouth at bedtime.    atorvastatin (LIPITOR) 80 MG tablet TAKE 1 TABLET DAILY Qty: 90 tablet, Refills: 1    Cholecalciferol 1000 UNITS capsule Take 3,000 Units by mouth daily.    clopidogrel (PLAVIX) 75 MG tablet TAKE 1 TABLET DAILY Qty: 90 tablet, Refills: 1    colesevelam (WELCHOL) 625 MG  tablet Take 1 tablet (625 mg total) by mouth 3 (three) times daily. Qty: 270 tablet, Refills: 3    estradiol (ESTRACE VAGINAL) 0.1 MG/GM vaginal cream Place 2 g vaginally 2 (two) times a week. Thursday and Sunday    Febuxostat (ULORIC) 80 MG TABS Take 80 mg by mouth daily.    levothyroxine (SYNTHROID, LEVOTHROID) 112 MCG tablet TAKE 1 TABLET DAILY Qty: 90 tablet, Refills: 3    lisinopril (PRINIVIL,ZESTRIL) 10 MG tablet Take 1 tablet (10 mg total) by mouth daily. Qty: 90 tablet, Refills: 3    metoprolol tartrate (LOPRESSOR) 25 MG tablet Take one-half (1/2) tablet twice a day. Qty: 90 tablet, Refills: 3      STOP taking these medications     azithromycin (ZITHROMAX) 250 MG tablet      Calcium Carbonate (CALTRATE 600) 1500  MG TABS      isosorbide mononitrate (IMDUR) 30 MG 24 hr tablet         ALLERGIES:   Allergies  Allergen Reactions  . Fish Oil Other (See Comments)    Severe indigestion  . Naproxen Nausea And Vomiting  . Tramadol Nausea And Vomiting  . Codeine Nausea And Vomiting and Rash  . Morphine Sulfate Nausea And Vomiting and Rash  . Sulfonamide Derivatives Nausea And Vomiting and Rash    BRIEF HPI:  See H&P, Labs, Consult and Test reports for all details in brief, patient is a 80 y/o female with past medical hx of hypertension, moderate aortic stenosis, hypothyroidism, coronary artery disease with history of NST MI status post BMS, abdominal aortic aneurysm status post repair, dilated descending aorta, carotid artery stenosis ( >80% right, currently under observation), hypertension, hyperlipidemia, presented with Cough with greenish sputum for past 1 week associated with progressive SOB and chest congestion.A CT of chest without contrast was done which showed dilated descending thoracic aorta with max AP diameter of 4.5 cm. Patchy airspace ds in left lung consistent with pneumonia was also found. After discussing with vascular surgeon Dr Kellie Simmering pt [facility]  CONSULTATIONS:   vascular surgery  PERTINENT RADIOLOGIC STUDIES: No results found.   PERTINENT LAB RESULTS: CBC: No results for input(s): WBC, HGB, HCT, PLT in the last 72 hours. CMET CMP     Component Value Date/Time   NA 143 05/30/2014 0850   NA 139 03/05/2011   K 4.3 05/30/2014 0850   CL 105 05/30/2014 0850   CO2 22 05/30/2014 0850   GLUCOSE 105* 05/30/2014 0850   BUN 24* 05/30/2014 0850   BUN 26* 03/05/2011   CREATININE 1.43* 05/30/2014 0850   CREATININE 1.3* 03/05/2011   CREATININE 1.3* 03/05/2011   CALCIUM 9.2 05/30/2014 0850   PROT 6.7 11/06/2013 0854   ALBUMIN 3.9 11/06/2013 0854   AST 22 11/06/2013 0854   ALT 15 11/06/2013 0854   ALKPHOS 53 11/06/2013 0854   BILITOT 0.6  11/06/2013 0854   GFRNONAA 33* 05/30/2014 0850   GFRAA 38* 05/30/2014 0850    GFR Estimated Creatinine Clearance: 31.3 mL/min (by C-G formula based on Cr of 1.43). No results for input(s): LIPASE, AMYLASE in the last 72 hours. No results for input(s): CKTOTAL, CKMB, CKMBINDEX, TROPONINI in the last 72 hours. Invalid input(s): POCBNP No results for input(s): DDIMER in the last 72 hours. No results for input(s): HGBA1C in the last 72 hours. No results for input(s): CHOL, HDL, LDLCALC, TRIG, CHOLHDL, LDLDIRECT in the last 72 hours. No results for input(s): TSH, T4TOTAL, T3FREE, THYROIDAB in the  last 72 hours.  Invalid input(s): FREET3 No results for input(s): VITAMINB12, FOLATE, FERRITIN, TIBC, IRON, RETICCTPCT in the last 72 hours. Coags: No results for input(s): INR in the last 72 hours.  Invalid input(s): PT Microbiology: Recent Results (from the past 240 hour(s))  MRSA PCR Screening     Status: None   Collection Time: 05/30/14  6:28 AM  Result Value Ref Range Status   MRSA by PCR NEGATIVE NEGATIVE Final    Comment:        The GeneXpert MRSA Assay (FDA approved for NASAL specimens only), is one component of a comprehensive MRSA colonization surveillance program. It is not intended to diagnose MRSA infection nor to guide or monitor treatment for MRSA infections.   Culture, blood (routine x 2) Call MD if unable to obtain prior to antibiotics being given     Status: None (Preliminary result)   Collection Time: 05/30/14  8:35 AM  Result Value Ref Range Status   Specimen Description BLOOD RIGHT HAND  Final   Special Requests BOTTLES DRAWN AEROBIC ONLY 10CC  Final   Culture  Setup Time   Final    05/30/2014 13:25 Performed at [facility]    Culture   Final           BLOOD CULTURE RECEIVED NO GROWTH TO DATE CULTURE WILL BE HELD FOR 5 DAYS BEFORE ISSUING A FINAL NEGATIVE REPORT Performed at [facility]    Report Status PENDING  Incomplete  Culture, blood  (routine x 2) Call MD if unable to obtain prior to antibiotics being given     Status: None (Preliminary result)   Collection Time: 05/30/14  8:50 AM  Result Value Ref Range Status   Specimen Description BLOOD LEFT HAND  Final   Special Requests BOTTLES DRAWN AEROBIC ONLY 10CC  Final   Culture  Setup Time   Final    05/30/2014 13:24 Performed at [facility]    Culture   Final           BLOOD CULTURE RECEIVED NO GROWTH TO DATE CULTURE WILL BE HELD FOR 5 DAYS BEFORE ISSUING A FINAL NEGATIVE REPORT Performed at [facility]    Report Status PENDING  Incomplete  Culture, sputum-assessment     Status: None   Collection Time: 06/01/14  9:51 AM  Result Value Ref Range Status   Specimen Description SPUTUM  Final   Special Requests NONE  Final   Sputum evaluation   Final    THIS SPECIMEN IS ACCEPTABLE. RESPIRATORY CULTURE REPORT TO FOLLOW.   Report Status 06/01/2014 FINAL  Final  Culture, respiratory (NON-Expectorated)     Status: None (Preliminary result)   Collection Time: 06/01/14  9:51 AM  Result Value Ref Range Status   Specimen Description SPUTUM  Final   Special Requests NONE  Final   Gram Stain PENDING  Incomplete   Culture   Final    Culture reincubated for better growth Performed at [facility] COURSE:   Principal Problem:   Community acquired pneumonia:Admitted and started on IV Rocephin/Zithromax. HIV neg, Urine Legionella/Streptococcal negative. Significantly improved, all cultures neg so far. Still has some coughing spells but much better than on admission. Since improving will be transitioned to oral Levaquin and discharged home. She has been instructed to follow with her PCP in 1-2 weeks. She will need a follow up CXR in 4-6 weeks.  Active  Problems: Carotid Stenosis/PVD/CAD/dilated descending aorta: Seen by Vascular surgery, unchanged from previous. Plavix and statin continued. Will need  continued outpatient follow up/surveillance with [facility].Patient was asked to call [facility] tomorrow (Monday) and make an appt with Dr Oneida Alar.   Essential hypertension:BP controlled.Continue Lisinopril and Metoprolol   CAD s/p XNT:ZGYFVCBS ASA/Plavix,Statin and Beta Blocker. No chest pain/SOB   Hx of Carotid stenosis:follows outpatient with Dr Oneida Alar, review of outpatient [facility] notes-medical management.    Hypothyroidism:c/w Levothyroxine   Chronic kidney disease (CKD), stage II (mild):per outpatient notes-has solitary kidney-creatinine currently close to baseline.Please continue to monitor closely in the outpatient setting.  TODAY-DAY OF DISCHARGE:  Subjective:   Elissia Spiewak today has no headache,no chest abdominal pain,no new weakness tingling or numbness, feels much better wants to go home today.   Objective:   Blood pressure 113/59, pulse 57, temperature 97.6 F (36.4 C), temperature source Oral, resp. rate 18, height 5\' 7"  (1.702 m), weight 77.1 kg (169 lb 15.6 oz), SpO2 96 %.  Intake/Output Summary (Last 24 hours) at 06/02/14 1303 Last data filed at 06/02/14 0909  Gross per 24 hour  Intake    840 ml  Output      0 ml  Net    840 ml   Filed Weights   05/30/14 0630  Weight: 77.1 kg (169 lb 15.6 oz)    Exam Awake Alert, Oriented *3, No new F.N deficits, Normal affect [place].AT,PERRAL Supple Neck,No JVD, No cervical lymphadenopathy appriciated.  Symmetrical Chest wall movement, Good air movement bilaterally, CTAB RRR,No Gallops,Rubs or new Murmurs, No Parasternal Heave +ve B.Sounds, Abd Soft, Non tender, No organomegaly appriciated, No rebound -guarding or rigidity. No Cyanosis, Clubbing or edema, No new Rash or bruise  DISCHARGE CONDITION: Stable  DISPOSITION: Home  DISCHARGE INSTRUCTIONS:    Activity:  As tolerated   Diet recommendation: Heart Healthy diet  Discharge Instructions    Call MD for:  difficulty breathing, headache or visual disturbances    Complete  by:  As directed      Call MD for:  extreme fatigue    Complete by:  As directed      Diet - low sodium heart healthy    Complete by:  As directed      Increase activity slowly    Complete by:  As directed            Follow-up Information    Follow up with Eliezer Lofts, MD. Schedule an appointment as soon as possible for a visit in 1 week.   Specialty:  Family Medicine   Contact information:   [place] [address] [phone]       Follow up with Elam Dutch, MD. Schedule an appointment as soon as possible for a visit in 2 weeks.   Specialty:  Vascular Surgery   Contact information:   [address] [place] [address] [phone]      Total Time spent on discharge equals 45 minutes.  SignedOren Binet 06/02/2014 1:03 PM

## 2014-06-02 NOTE — Progress Notes (Signed)
  Discharge instructions and prescriptions given to patient along with details describing each medication and when the next dose is due. IV lock removed per order. Discharged via wheelchair to daughter's care with NT present. Melford Aase, RN

## 2014-06-03 ENCOUNTER — Telehealth: Payer: Self-pay | Admitting: Family Medicine

## 2014-06-03 NOTE — Telephone Encounter (Signed)
Agreed -

## 2014-06-03 NOTE — Telephone Encounter (Signed)
Robin, Please put her in any 30 minute slot with Dr. Diona Browner for hosptial follow up.

## 2014-06-03 NOTE — Telephone Encounter (Signed)
[name] called to get ms Giles a hospital follow she was discharged [facility]/14.  Follow up appointment 06/21/14  [name] wanted to know if she could be see sooner/

## 2014-06-04 LAB — CULTURE, RESPIRATORY W GRAM STAIN: Culture: NORMAL

## 2014-06-04 LAB — CULTURE, RESPIRATORY

## 2014-06-05 LAB — CULTURE, BLOOD (ROUTINE X 2)
Culture: NO GROWTH
Culture: NO GROWTH

## 2014-06-05 NOTE — Telephone Encounter (Signed)
Appointment 12/11

## 2014-06-19 ENCOUNTER — Encounter: Payer: Self-pay | Admitting: Vascular Surgery

## 2014-06-19 ENCOUNTER — Telehealth: Payer: Self-pay | Admitting: Family Medicine

## 2014-06-19 DIAGNOSIS — J189 Pneumonia, unspecified organism: Secondary | ICD-10-CM

## 2014-06-19 DIAGNOSIS — N182 Chronic kidney disease, stage 2 (mild): Secondary | ICD-10-CM

## 2014-06-19 NOTE — Telephone Encounter (Signed)
-----   Message from Ellamae Sia sent at 06/12/2014 12:57 PM EST ----- Regarding: Lab orders for Thursday, 12.10.15 Lab orders for a hospital f/u?

## 2014-06-20 ENCOUNTER — Other Ambulatory Visit: Payer: Medicare Other

## 2014-06-20 ENCOUNTER — Ambulatory Visit (INDEPENDENT_AMBULATORY_CARE_PROVIDER_SITE_OTHER): Payer: Medicare Other | Admitting: Vascular Surgery

## 2014-06-20 ENCOUNTER — Encounter: Payer: Self-pay | Admitting: Vascular Surgery

## 2014-06-20 VITALS — BP 116/76 | HR 65 | Ht 67.0 in | Wt 169.5 lb

## 2014-06-20 DIAGNOSIS — I712 Thoracic aortic aneurysm, without rupture, unspecified: Secondary | ICD-10-CM

## 2014-06-20 DIAGNOSIS — I6521 Occlusion and stenosis of right carotid artery: Secondary | ICD-10-CM

## 2014-06-20 NOTE — Progress Notes (Signed)
Established Carotid Patient  History of Present Illness  [name] is a 80 y.o. (1928/02/11) female who presents for two week follow-up after recent hospitalization for pneumonia 05/30/14 - 06/02/14. She had a stable descending thoracic aortic aneurysm on CT scan.  Her daughter is present and said there was concern for congestive heart failure during her hospital admission. She will follow up with cardiologist Dr. Rockey Situ concerning this.   She has known high-grade stenosis of the right internal carotid artery with high bifurcation. She was not considered a good carotid stenting due to her renal insufficiency and risky carotid endarterectomy candidate due to her severe coronary disease.   Previous carotid studies demonstrated: RICA 10-27% stenosis,  LICA <25% stenosis.  Patient has no recent history of TIA or stroke symptom.  The patient denies amaurosis fugax or monocular blindness.  The patient has not had facial drooping or hemiplegia.  The patient has not had receptive or expressive aphasia.  She is on aspirin, plavix and a statin      Physical Examination  Filed Vitals:   06/20/14 1447  BP: 116/76  Pulse: 65  Height: 5\' 7"  (1.702 m)  Weight: 169 lb 8 oz (76.885 kg)  SpO2: 96%   Body mass index is 26.54 kg/(m^2).  General: A&O x 3, WD elderly female in NAD  Pulmonary: Sym exp, good air movt, CTAB, no rales, rhonchi, & wheezing  Cardiac: RRR, Nl S1, S2, systolic murmur, soft right carotid bruit, no left carotid bruit  Vascular: 2+ radial pulses bilaterally  Neurologic: CN 2-12 grossly intact  Non-Invasive Vascular Imaging  CAROTID DUPLEX (Date: 08/23/2013):   R ICA stenosis: >80%  R VA:  patent and antegrade  L ICA stenosis: <40%  L VA:  patent and antegrade  Medical Decision Making  [name] is a 80 y.o. female who is here for two week follow-up after hospital admission for pneumonia. She has known high grade 80-99% right internal carotid artery  stenosis with high bifurcation and severe coronary artery disease. She is not a carotid stent candidate due to her renal insufficiency and risky candidate for carotid endarterectomy due to her coronary disease. She describes no recent TIA or stroke symptoms since her last office visit. She will continue on maximal medical management with aspirin, plavix, statin and strict control of blood pressure. She will follow up in six months for carotid duplex studies.  There was concern for possible congestive heart failure during her hospital admission. She will follow up her cardiologist Dr. Rockey Situ.    Virgina Jock, PA-C [facility] Office: [phone] Pager: [phone]  06/20/2014, 3:13 PM  This patient was seen in conjunction with Dr. Oneida Alar  History and exam details as above. Patient has a known 4.2 cm descending thoracic aortic aneurysm. She most likely would not be a candidate for repair of this due to her underlying renal insufficiency and age. However she probably needs a repeat CT scan of the chest and one year. She has a known high-grade right internal carotid artery stenosis which is asymptomatic currently. She was not deemed to be a candidate for carotid stenting due to her underlying renal dysfunction and solitary kidney. She also has significant coronary disease and was not thought to be a very good candidate for carotid endarterectomy electively due to her coronary disease. However, if she develops symptoms of TIA amaurosis or stroke we would need to consider whether or not carotid endarterectomy or carotid stenting would be  in her best interest. If she continues to remain asymptomatic, she will return for carotid duplex scan in 6 months. We will also decide at that office visit whether or not to repeat her CT scan of the chest to continue to follow her descending thoracic aortic aneurysm.  [name], MD [facility] Office: [phone] Pager: [phone]

## 2014-06-21 ENCOUNTER — Encounter: Payer: Self-pay | Admitting: Family Medicine

## 2014-06-21 ENCOUNTER — Ambulatory Visit (INDEPENDENT_AMBULATORY_CARE_PROVIDER_SITE_OTHER): Payer: Medicare Other | Admitting: Family Medicine

## 2014-06-21 ENCOUNTER — Other Ambulatory Visit: Payer: Self-pay | Admitting: *Deleted

## 2014-06-21 VITALS — BP 110/70 | HR 67 | Temp 98.0°F | Ht 67.0 in | Wt 169.0 lb

## 2014-06-21 DIAGNOSIS — N182 Chronic kidney disease, stage 2 (mild): Secondary | ICD-10-CM

## 2014-06-21 DIAGNOSIS — I6523 Occlusion and stenosis of bilateral carotid arteries: Secondary | ICD-10-CM

## 2014-06-21 DIAGNOSIS — I10 Essential (primary) hypertension: Secondary | ICD-10-CM

## 2014-06-21 DIAGNOSIS — J189 Pneumonia, unspecified organism: Secondary | ICD-10-CM

## 2014-06-21 LAB — CBC WITH DIFFERENTIAL/PLATELET
BASOS PCT: 0.6 % (ref 0.0–3.0)
Basophils Absolute: 0.1 10*3/uL (ref 0.0–0.1)
Eosinophils Absolute: 0.1 10*3/uL (ref 0.0–0.7)
Eosinophils Relative: 1.5 % (ref 0.0–5.0)
HEMATOCRIT: 39.3 % (ref 36.0–46.0)
HEMOGLOBIN: 12.7 g/dL (ref 12.0–15.0)
LYMPHS PCT: 21.9 % (ref 12.0–46.0)
Lymphs Abs: 2.1 10*3/uL (ref 0.7–4.0)
MCHC: 32.5 g/dL (ref 30.0–36.0)
MCV: 88.4 fl (ref 78.0–100.0)
Monocytes Absolute: 0.6 10*3/uL (ref 0.1–1.0)
Monocytes Relative: 6.2 % (ref 3.0–12.0)
NEUTROS ABS: 6.6 10*3/uL (ref 1.4–7.7)
Neutrophils Relative %: 69.8 % (ref 43.0–77.0)
Platelets: 185 10*3/uL (ref 150.0–400.0)
RBC: 4.44 Mil/uL (ref 3.87–5.11)
RDW: 16.3 % — ABNORMAL HIGH (ref 11.5–15.5)
WBC: 9.4 10*3/uL (ref 4.0–10.5)

## 2014-06-21 LAB — BASIC METABOLIC PANEL
BUN: 35 mg/dL — AB (ref 6–23)
CHLORIDE: 109 meq/L (ref 96–112)
CO2: 24 mEq/L (ref 19–32)
Calcium: 9.9 mg/dL (ref 8.4–10.5)
Creatinine, Ser: 1.5 mg/dL — ABNORMAL HIGH (ref 0.4–1.2)
GFR: 34.84 mL/min — ABNORMAL LOW (ref >60.00)
Glucose, Bld: 98 mg/dL (ref 70–99)
Potassium: 4.7 mEq/L (ref 3.5–5.1)
Sodium: 138 mEq/L (ref 135–145)

## 2014-06-21 NOTE — Progress Notes (Signed)
Pre visit review using our clinic review tool, if applicable. No additional management support is needed unless otherwise documented below in the visit note. 

## 2014-06-21 NOTE — Addendum Note (Signed)
Addended by: Ellamae Sia on: 06/21/2014 11:56 AM   Modules accepted: Orders

## 2014-06-21 NOTE — Assessment & Plan Note (Signed)
Resolved. eval wbc today to assure resolved. Recheck CXR in 2-4 weeks.

## 2014-06-21 NOTE — Progress Notes (Signed)
Subjective:    Patient ID: [name], female    DOB: 25-Aug-1927, 80 y.o.   MRN: [id number]  HPI  79 y.o. (26-Jun-1928) female who presents follow-up after recent hospitalization for pneumonia 05/30/14 - 06/02/14.  She presented with Cough with greenish sputum for 1 week associated with progressive SOB and chest congestion.  A CT of chest without contrast was done which showed dilated descending thoracic aorta with max AP diameter of 4.5 cm. Patchy airspace ds in left lung consistent with pneumonia was also found.  She was treated with IV rocephin/ Zithromax then transitioned to oral levaquin.  Reviewed hospital note in detail  She had a stable asymptomatic descending thoracic aortic aneurysm on CT scan.  She followed up with vascular MD on 12/10. She was not deemed to be a candidate for carotid stenting due to her underlying renal dysfunction and solitary kidney unless she becomes symptomatic.    Recommendations for Outpatient Follow-up:  1. Please repeat Chest Xray in 4-6 weeks to ensure resolution on PNA 2. Monitor renal function closely ( this is followed by Dr. Zara Chess whom she has seen in last week)  She has appt with cards on 12/22.  She feels better with cough, no SOB. No fever. She is back to being active.  She is dealing fairly well with the death of her sister. Has a supportive neighborhood and family. No depression.  Review of Systems  Constitutional: Negative for fever and fatigue.  HENT: Negative for ear pain.   Eyes: Negative for pain.  Respiratory: Negative for chest tightness and shortness of breath.   Cardiovascular: Negative for chest pain, palpitations and leg swelling.  Gastrointestinal: Negative for abdominal pain.  Genitourinary: Negative for dysuria.       Objective:   Physical Exam  Constitutional: Vital signs are normal. She appears well-developed and well-nourished. She is cooperative.  Non-toxic appearance. She does not appear ill. No  distress.  Elderly female   HENT:  Head: Normocephalic.  Right Ear: Hearing, tympanic membrane, external ear and ear canal normal.  Left Ear: Hearing, tympanic membrane, external ear and ear canal normal.  Nose: Nose normal.  Eyes: Conjunctivae, EOM and lids are normal. Pupils are equal, round, and reactive to light. Lids are everted and swept, no foreign bodies found.  Neck: Trachea normal and normal range of motion. Neck supple. Carotid bruit is not present. No thyroid mass and no thyromegaly present.  Cardiovascular: Normal rate, regular rhythm, S1 normal, S2 normal and intact distal pulses.  Exam reveals no gallop.   Murmur heard.  Crescendo systolic murmur is present with a grade of 3/6  Greatest at RUSB  Pulmonary/Chest: Effort normal and breath sounds normal. No respiratory distress. She has no wheezes. She has no rhonchi. She has no rales.  Abdominal: Soft. Normal appearance and bowel sounds are normal. She exhibits no distension, no fluid wave, no abdominal bruit and no mass. There is no hepatosplenomegaly. There is no tenderness. There is no rebound, no guarding and no CVA tenderness. No hernia.  Lymphadenopathy:    She has no cervical adenopathy.    She has no axillary adenopathy.  Neurological: She is alert. She has normal strength. No cranial nerve deficit or sensory deficit.  Skin: Skin is warm, dry and intact. No rash noted.  Psychiatric: She has a normal mood and affect. Her speech is normal and behavior is normal. Judgment and thought content normal. Her mood appears not anxious. Cognition and memory are normal. She does  not exhibit a depressed mood.          Assessment & Plan:

## 2014-06-21 NOTE — Patient Instructions (Addendum)
Schedule CXR only in 2-4 weeks. Stop at lab on way out. Move back Medicare wellness with labs prior to June.

## 2014-06-21 NOTE — Assessment & Plan Note (Signed)
Well controlled. Continue current medication.  

## 2014-06-21 NOTE — Assessment & Plan Note (Signed)
Re-eval creatinine today

## 2014-06-21 NOTE — Addendum Note (Signed)
Addended by: Eliezer Lofts E on: 06/21/2014 11:51 AM   Modules accepted: Orders

## 2014-06-24 ENCOUNTER — Encounter: Payer: Self-pay | Admitting: *Deleted

## 2014-06-25 ENCOUNTER — Ambulatory Visit: Payer: Medicare Other

## 2014-06-27 ENCOUNTER — Encounter: Payer: Medicare Other | Admitting: Family Medicine

## 2014-07-02 ENCOUNTER — Ambulatory Visit (INDEPENDENT_AMBULATORY_CARE_PROVIDER_SITE_OTHER): Payer: Medicare Other | Admitting: Cardiovascular Disease

## 2014-07-02 ENCOUNTER — Encounter: Payer: Self-pay | Admitting: Cardiovascular Disease

## 2014-07-02 VITALS — BP 130/80 | HR 60 | Ht 66.0 in | Wt 170.2 lb

## 2014-07-02 DIAGNOSIS — E785 Hyperlipidemia, unspecified: Secondary | ICD-10-CM

## 2014-07-02 DIAGNOSIS — I2510 Atherosclerotic heart disease of native coronary artery without angina pectoris: Secondary | ICD-10-CM

## 2014-07-02 DIAGNOSIS — I739 Peripheral vascular disease, unspecified: Secondary | ICD-10-CM

## 2014-07-02 DIAGNOSIS — I714 Abdominal aortic aneurysm, without rupture, unspecified: Secondary | ICD-10-CM

## 2014-07-02 DIAGNOSIS — I10 Essential (primary) hypertension: Secondary | ICD-10-CM

## 2014-07-02 DIAGNOSIS — I350 Nonrheumatic aortic (valve) stenosis: Secondary | ICD-10-CM

## 2014-07-02 DIAGNOSIS — N189 Chronic kidney disease, unspecified: Secondary | ICD-10-CM

## 2014-07-02 NOTE — Assessment & Plan Note (Signed)
Recent creatinine 1.73. Appears to be slightly above her baseline

## 2014-07-02 NOTE — Progress Notes (Signed)
Patient ID: MIARA EMMINGER, female    DOB: 25-Oct-1927, 80 y.o.   MRN: [id number]  HPI Comments: Ms. Sobczyk is an 80 year old woman with history of coronary artery disease, status post non-ST-elevation myocardial infarction in January 2007, treated with bare-metal stenting to the obtuse marginal,  totally occluded right coronary with distal collateralization and signifcant LCX disease which is being treated medically as it is not easily amenable to intervention. She has normal LV function, Peripheral vascular disease  with previous abdominal aortic aneurysm repair and bilateral iliac stenting, carotid artery stenosis (critical on the right) followed by Dr. Oneida Alar, renal artery stenosis with an atrophic left kidney and a moderate blockage on the right, hypertension and hyperlipidemia. Also with mild to moderate aortic valve  Stenosis in 2012. She presents for follow-up of her coronary artery disease  In follow-up today, she reports that she is doing well. Pneumonia in November 2015. She has recovered she was seen in the emergency room of [facility], transferred to [facility] at the daughter's request. CT scan at that time was reviewed with her showing pneumonia on the left Cultures at [facility] were unrevealing CT scan did document 4.5 cm descending AAA Lab work reviewed showing creatinine 1.73, BNP 739, troponin 0.28  Otherwise she currently feels well. Denies any chest pain or shortness of breath. Critical carotid disease managed in [facility], medical management at this time  EKG on today's visit shows normal sinus rhythm with rate 60 bpm, no significant ST or T-wave changes  Other past medical history She stopped smoking approximately 8 years ago, smoked for 20-30 years but the exact number of years is vague In followup today, she reports that she is doing well. She suffered an accident and fractured her fifth finger, currently in a splint. No surgery needed  Tolerating her medications well, total  cholesterol 134 in December 2014 She tolerated WelChol one pill twice a day.   she feels well with no complaints .   carotid ultrasound showed progression of the stenosis on the right. Estimate is 80-99%.  She has seen Dr. Oneida Alar with repeat followup and ultrasound in 2014. Plan was made to watch her closely with possible stenting with any symptoms of TIA or stroke   Aortic ultrasound showed tortuous ectatic aorta biiliac bypass graft. Maximum dimensions 3.5 x 3.3 cm, dilated left common iliac artery 2 cm Previous echocardiogram EF 55-65% Mild to moderate AS      Allergies  Allergen Reactions  . Fish Oil Other (See Comments)    Severe indigestion  . Naproxen Nausea And Vomiting  . Tramadol Nausea And Vomiting  . Codeine Nausea And Vomiting and Rash  . Morphine Sulfate Nausea And Vomiting and Rash  . Sulfonamide Derivatives Nausea And Vomiting and Rash    Outpatient Encounter Prescriptions as of 07/02/2014  Medication Sig  . acetaminophen (TYLENOL) 500 MG tablet Take 1,000 mg by mouth 2 (two) times daily as needed for pain.  Marland Kitchen aspirin EC 81 MG tablet Take 81 mg by mouth at bedtime.  Marland Kitchen atorvastatin (LIPITOR) 80 MG tablet TAKE 1 TABLET DAILY  . Cholecalciferol 1000 UNITS capsule Take 3,000 Units by mouth daily.  . clopidogrel (PLAVIX) 75 MG tablet TAKE 1 TABLET DAILY  . colesevelam (WELCHOL) 625 MG tablet Take 1 tablet (625 mg total) by mouth 3 (three) times daily.  Marland Kitchen estradiol (ESTRACE VAGINAL) 0.1 MG/GM vaginal cream Place 2 g vaginally 2 (two) times a week. Thursday and Sunday  . Febuxostat (ULORIC) 80 MG TABS Take  80 mg by mouth daily.  . isosorbide mononitrate (IMDUR) 30 MG 24 hr tablet Take 30 mg by mouth daily.   Marland Kitchen levothyroxine (SYNTHROID, LEVOTHROID) 112 MCG tablet TAKE 1 TABLET DAILY  . lisinopril (PRINIVIL,ZESTRIL) 10 MG tablet Take 1 tablet (10 mg total) by mouth daily.  . metoprolol tartrate (LOPRESSOR) 25 MG tablet Take one-half (1/2) tablet twice a day.    Past  Medical History  Diagnosis Date  . Aortic stenosis     Moderate; Echo 12/09. moderate AS mean 19. AVA 0.88  . PAD (peripheral artery disease)     s/p iliac stents  . HTN (hypertension)   . Hyperparathyroidism   . Hypothyroidism   . GERD (gastroesophageal reflux disease)   . Depression 10-10-2004    With death of spouse  . Ventral hernia     Moderate sized  . CAD (coronary artery disease)     s/p NSTEMI 1/07 with BMS to OM; cath 12/09 due to positive Myoview, LM ok. LAD 40%. LCX 80-90% mid. RCA chronically occluded with R --> L collats  . AAA (abdominal aortic aneurysm)     Recurrent; s/p repair, now 3.8 cam; h/o ischemic bowel 10/10/04  . Carotid artery stenosis     R 60-79% L 0-39% (6/10) - stable  . Hyperlipidemia     Increased LFTs on Vytorin. Changed to Lipitor.  Marland Kitchen CRI (chronic renal insufficiency)     Mild, Cr 1.3  . Renal artery stenosis     Atrophic L kidney. Moderate blockage on R  . Basal cell carcinoma of nose 1980s  . Incontinence   . Full dentures   . Bruises easily   . Myocardial infarction 10/10/05  . Vitamin D deficiency     Past Surgical History  Procedure Laterality Date  . Cholecystectomy  07/01/2005  . Breast biospy  1996  . Appendectomy  1954  . Tonsillectomy  10-10-1941  . Hernia repair  1997  . Coronary stent placement    . Breast surgery    . Abdominal aortic aneurysm repair  1995, 07/01/2005  . Angioplasty  2003/10/11 - 07/01/2005  . Iliac artery stent  Oct 11, 2003 - 07/01/2005    Stent placement, both legs - PVD in arteries  . Colonoscopy  07/07/2005    Colonic erosions, ulcers proctitis  . Upper gastrointestinal endoscopy  07/07/2005    Nml  . Nstemi  08/13/2005    1/07 s/p stent in circumflex  . Colonoscopy w/ endoscopic [place]  07/01/2005    [place] I kidney diffuse atrophy  . Cholecystectomy  1987  . Hosp  04/2005    ? abd. prob. mesenteric ischemia  . Coronary stent placement  07/2005    MI   . [place]  01/2006    Hosp for reflux (chest pain)    Social History   reports that she quit smoking about 8 years ago. She has never used smokeless tobacco. She reports that she does not drink alcohol or use illicit drugs.  Family History family history includes Breast cancer in her sister; Cancer in her father, mother, and sister; Heart attack in her father and mother; Heart disease in her father and mother; Hyperlipidemia in her sister, sister, and son; Hypertension in her sister; Stroke in her father and mother; Throat cancer in her father. There is no history of Colon cancer.     Review of Systems  Constitutional: Negative.   HENT: Negative.   Eyes: Negative.   Respiratory: Negative.   Cardiovascular: Negative.  Gastrointestinal: Negative.   Endocrine: Negative.   Musculoskeletal: Negative.        Hand in a splint from recent fracture  Skin: Negative.   Allergic/Immunologic: Negative.   Neurological: Negative.   Hematological: Negative.   Psychiatric/Behavioral: Negative.   All other systems reviewed and are negative.   BP 130/80 mmHg  Pulse 60  Ht 5\' 6"  (1.676 m)  Wt 170 lb 4 oz (77.225 kg)  BMI 27.49 kg/m2  Physical Exam  Constitutional: She is oriented to person, place, and time. She appears well-developed and well-nourished.  HENT:  Head: Normocephalic.  Nose: Nose normal.  Mouth/Throat: Oropharynx is clear and moist.  Eyes: Conjunctivae are normal. Pupils are equal, round, and reactive to light.  Neck: Normal range of motion. Neck supple. No JVD present. Carotid bruit is present.  Cardiovascular: Normal rate, regular rhythm, S1 normal, S2 normal and intact distal pulses.  Exam reveals no gallop and no friction rub.   Murmur heard.  Systolic murmur is present with a grade of 2/6  Pulses:      Dorsalis pedis pulses are 0 on the right side, and 0 on the left side.       Posterior tibial pulses are 0 on the right side, and 0 on the left side.  Pulmonary/Chest: Effort normal and breath sounds normal. No respiratory distress. She has  no wheezes. She has no rales. She exhibits no tenderness.  Abdominal: Soft. Bowel sounds are normal. She exhibits no distension. There is no tenderness.  Musculoskeletal: Normal range of motion. She exhibits no edema or tenderness.  Lymphadenopathy:    She has no cervical adenopathy.  Neurological: She is alert and oriented to person, place, and time. Coordination normal.  Skin: Skin is warm and dry. No rash noted. No erythema.  Psychiatric: She has a normal mood and affect. Her behavior is normal. Judgment and thought content normal.    Assessment and Plan  Nursing note and vitals reviewed.

## 2014-07-02 NOTE — Assessment & Plan Note (Signed)
Cholesterol is at goal on the current lipid regimen. No changes to the medications were made.  

## 2014-07-02 NOTE — Assessment & Plan Note (Signed)
Currently with no symptoms of angina. No further workup at this time. Continue current medication regimen. 

## 2014-07-02 NOTE — Assessment & Plan Note (Signed)
Blood pressure is well controlled on today's visit. No changes made to the medications. 

## 2014-07-02 NOTE — Assessment & Plan Note (Signed)
Followed in [place], 4.5 cm on recent CT scan

## 2014-07-02 NOTE — Patient Instructions (Signed)
You are doing well. No medication changes were made.  Please fast for lab work at your convenience  Please call us if you have new issues that need to be addressed before your next appt.  Your physician wants you to follow-up in: 6 months.  You will receive a reminder letter in the mail two months in advance. If you don't receive a letter, please call our office to schedule the follow-up appointment.

## 2014-07-02 NOTE — Assessment & Plan Note (Signed)
Mild to moderate aortic valve stenosis and 2012. Consider repeat echocardiogram next year

## 2014-07-03 ENCOUNTER — Other Ambulatory Visit (INDEPENDENT_AMBULATORY_CARE_PROVIDER_SITE_OTHER): Payer: Medicare Other | Admitting: *Deleted

## 2014-07-03 DIAGNOSIS — E785 Hyperlipidemia, unspecified: Secondary | ICD-10-CM

## 2014-07-03 DIAGNOSIS — I10 Essential (primary) hypertension: Secondary | ICD-10-CM

## 2014-07-03 DIAGNOSIS — I739 Peripheral vascular disease, unspecified: Secondary | ICD-10-CM

## 2014-07-03 DIAGNOSIS — I2510 Atherosclerotic heart disease of native coronary artery without angina pectoris: Secondary | ICD-10-CM

## 2014-07-04 LAB — HEPATIC FUNCTION PANEL
ALBUMIN: 3.9 g/dL (ref 3.5–4.7)
ALT: 11 IU/L (ref 0–32)
AST: 16 IU/L (ref 0–40)
Alkaline Phosphatase: 54 IU/L (ref 39–117)
Bilirubin, Direct: 0.19 mg/dL (ref 0.00–0.40)
Total Bilirubin: 0.7 mg/dL (ref 0.0–1.2)
Total Protein: 6.3 g/dL (ref 6.0–8.5)

## 2014-07-04 LAB — LIPID PANEL
Chol/HDL Ratio: 4.9 ratio units — ABNORMAL HIGH (ref 0.0–4.4)
Cholesterol, Total: 210 mg/dL — ABNORMAL HIGH (ref 100–199)
HDL: 43 mg/dL (ref >39)
LDL CALC: 121 mg/dL — AB (ref 0–99)
Triglycerides: 232 mg/dL — ABNORMAL HIGH (ref 0–149)
VLDL Cholesterol Cal: 46 mg/dL — ABNORMAL HIGH (ref 5–40)

## 2014-07-08 ENCOUNTER — Telehealth: Payer: Self-pay | Admitting: Cardiovascular Disease

## 2014-07-08 NOTE — Telephone Encounter (Signed)
Pt calling wanting to know results of lab work. Please call patient

## 2014-07-09 ENCOUNTER — Ambulatory Visit (INDEPENDENT_AMBULATORY_CARE_PROVIDER_SITE_OTHER)
Admission: RE | Admit: 2014-07-09 | Discharge: 2014-07-09 | Disposition: A | Discharge status: Home / Self Care | Payer: Medicare Other | Source: Ambulatory Visit | Attending: Family Medicine | Admitting: Family Medicine

## 2014-07-09 ENCOUNTER — Other Ambulatory Visit: Payer: Self-pay

## 2014-07-09 DIAGNOSIS — E785 Hyperlipidemia, unspecified: Secondary | ICD-10-CM

## 2014-07-09 DIAGNOSIS — J189 Pneumonia, unspecified organism: Secondary | ICD-10-CM

## 2014-07-09 NOTE — Telephone Encounter (Signed)
Please see result note sent to Christell Faith, PA, as Dr. Rockey Situ is out of the office.

## 2014-07-27 ENCOUNTER — Other Ambulatory Visit: Payer: Self-pay | Admitting: Family Medicine

## 2014-07-28 NOTE — Telephone Encounter (Signed)
Last office visit 06/21/2014.  Last TSH checked 06/19/2013.  Has Medicare Wellness scheduled for June 2016.  Ok to refill?

## 2014-08-20 ENCOUNTER — Other Ambulatory Visit: Payer: Medicare Other

## 2014-08-27 ENCOUNTER — Encounter: Payer: Medicare Other | Admitting: Family Medicine

## 2014-09-26 ENCOUNTER — Other Ambulatory Visit: Payer: Self-pay | Admitting: Family Medicine

## 2014-10-17 ENCOUNTER — Ambulatory Visit: Payer: Medicare Other | Admitting: Vascular Surgery

## 2014-10-17 ENCOUNTER — Other Ambulatory Visit (HOSPITAL_COMMUNITY): Payer: Medicare Other

## 2014-11-14 ENCOUNTER — Other Ambulatory Visit: Payer: Self-pay | Admitting: Cardiovascular Disease

## 2014-11-14 ENCOUNTER — Other Ambulatory Visit: Payer: Self-pay | Admitting: Family Medicine

## 2014-11-19 NOTE — Telephone Encounter (Signed)
This encounter was created in error - please disregard.

## 2014-11-20 ENCOUNTER — Other Ambulatory Visit (INDEPENDENT_AMBULATORY_CARE_PROVIDER_SITE_OTHER): Payer: Medicare Other | Admitting: *Deleted

## 2014-11-20 DIAGNOSIS — E785 Hyperlipidemia, unspecified: Secondary | ICD-10-CM

## 2014-11-21 LAB — LIPID PANEL
Chol/HDL Ratio: 4.1 ratio units (ref 0.0–4.4)
Cholesterol, Total: 184 mg/dL (ref 100–199)
HDL: 45 mg/dL (ref >39)
LDL CALC: 86 mg/dL (ref 0–99)
TRIGLYCERIDES: 265 mg/dL — AB (ref 0–149)
VLDL Cholesterol Cal: 53 mg/dL — ABNORMAL HIGH (ref 5–40)

## 2014-11-21 LAB — HEPATIC FUNCTION PANEL
ALK PHOS: 63 IU/L (ref 39–117)
ALT: 13 IU/L (ref 0–32)
AST: 18 IU/L (ref 0–40)
Albumin: 4 g/dL (ref 3.5–4.7)
Bilirubin Total: 0.6 mg/dL (ref 0.0–1.2)
Bilirubin, Direct: 0.13 mg/dL (ref 0.00–0.40)
Total Protein: 6.4 g/dL (ref 6.0–8.5)

## 2014-12-06 ENCOUNTER — Other Ambulatory Visit: Payer: Medicare Other

## 2014-12-06 ENCOUNTER — Telehealth: Payer: Self-pay | Admitting: Family Medicine

## 2014-12-06 NOTE — Telephone Encounter (Signed)
Pt just had lab with another MD.. No labs needed today !

## 2014-12-06 NOTE — Telephone Encounter (Signed)
Ms. Mclees notified.  Lab appointment cancelled.

## 2014-12-06 NOTE — Telephone Encounter (Signed)
-----   Message from Ignacia Marvel, [place] sent at 12/03/2014 12:02 PM EDT ----- Please order labs for upcoming lab appt on 12/06/14. Thanks!

## 2014-12-13 ENCOUNTER — Encounter: Payer: Self-pay | Admitting: Family Medicine

## 2014-12-13 ENCOUNTER — Ambulatory Visit (INDEPENDENT_AMBULATORY_CARE_PROVIDER_SITE_OTHER): Payer: Medicare Other | Admitting: Family Medicine

## 2014-12-13 VITALS — BP 128/82 | HR 61 | Temp 98.0°F | Ht 65.5 in | Wt 176.0 lb

## 2014-12-13 DIAGNOSIS — Z0000 Encounter for general adult medical examination without abnormal findings: Secondary | ICD-10-CM | POA: Diagnosis not present

## 2014-12-13 DIAGNOSIS — E038 Other specified hypothyroidism: Secondary | ICD-10-CM | POA: Diagnosis not present

## 2014-12-13 DIAGNOSIS — Z23 Encounter for immunization: Secondary | ICD-10-CM

## 2014-12-13 DIAGNOSIS — E785 Hyperlipidemia, unspecified: Secondary | ICD-10-CM

## 2014-12-13 DIAGNOSIS — E039 Hypothyroidism, unspecified: Secondary | ICD-10-CM | POA: Diagnosis not present

## 2014-12-13 DIAGNOSIS — Z7189 Other specified counseling: Secondary | ICD-10-CM | POA: Insufficient documentation

## 2014-12-13 DIAGNOSIS — I10 Essential (primary) hypertension: Secondary | ICD-10-CM

## 2014-12-13 LAB — TSH: TSH: 0.33 u[IU]/mL — AB (ref 0.35–4.50)

## 2014-12-13 NOTE — Progress Notes (Signed)
HPI I have personally reviewed the Medicare Annual Wellness questionnaire and have noted 1. The patient's medical and social history 2. Their use of alcohol, tobacco or illicit drugs 3. Their current medications and supplements 4. The patient's functional ability including ADL's, fall risks, home safety risks and hearing or visual             impairment. 5. Diet and physical activities 6. Evidence for depression or mood disorders 7.         Updated provider list  The patients weight, height, BMI and visual acuity have been recorded in the chart  I have made referrals, counseling and provided education to the patient based review of the above and I have provided the pt with a written personalized care plan for preventive services.   Has noted some hearing loss in last year  Her sister passed away in last year. She is doing well with her mood though..  CRF: followed by Dr. Zara Chess, Cr stable 1.3-1.5 last appt  Reviewed 11/2014  Hypertension: Well controlled on current meds.  BP Readings from Last 3 Encounters:  12/13/14 128/82  07/02/14 130/80  06/21/14 110/70  Using medications without problems:None  Muscle aches: None   Wt Readings from Last 3 Encounters:  06/26/13 172 lb 8 oz (78.245 kg)  04/18/13 175 lb 8 oz (79.606 kg)  04/12/13 179 lb (81.194 kg)    Healthy eating. Walking daily at track.   Elevated Cholesterol:  LDL almost at goal < 70 on welchol and atorvastatin 80 mg   She brings papers stating welchol will no longer be covered. She would like to switch to something else. They recommend a statin but she is already on . Will try colestipol. Lab Results  Component Value Date   CHOL 184 11/20/2014   HDL 45 11/20/2014   LDLCALC 86 11/20/2014   LDLDIRECT 55.7 12/15/2012   TRIG 265* 11/20/2014   CHOLHDL 4.1 11/20/2014  Using medications without problems: none Muscle aches: None Diet compliance: Good Exercise: daily. Other complaints:   CAD:  Followed by Dr. Rockey Situ. Last OV 04/18/2013 reviewed. Had bronchitis at that time on Z-pack/prednsione. Carotid dopplers 02/2012 critical stenosis: Followed by vascular. Holding on CEA given risks. Asymptomatic.   PVD.    Hypothyroid.  Due for re-eval.    Review of Systems  Constitutional: Negative for fever, fatigue and unexpected weight change.  HENT: Negative for ear pain, congestion, sore throat, sneezing, trouble swallowing and sinus pressure.  Eyes: Negative for pain and itching.  Respiratory: Negative for cough, shortness of breath and wheezing.  Cardiovascular: Negative for chest pain, palpitations and leg swelling.  Gastrointestinal: Negative for nausea, abdominal pain, diarrhea, constipation and blood in stool.  Genitourinary: Negative for dysuria, hematuria, vaginal discharge, difficulty urinating and menstrual problem.  Skin: Negative for rash.  Neurological: Negative for syncope, weakness, light-headedness, numbness and headaches.  Psychiatric/Behavioral: Negative for confusion and dysphoric mood. The patient is not nervous/anxious.  Objective:   Physical Exam  Constitutional: Vital signs are normal. She appears well-developed and well-nourished. She is cooperative. Non-toxic appearance. She does not appear ill. No distress.  Elderly female  HENT:  Head: Normocephalic.  Right Ear: Hearing, tympanic membrane, external ear and ear canal normal.  Left Ear: Hearing, tympanic membrane, external ear and ear canal normal.  Nose: Nose normal.  Eyes: Conjunctivae, EOM and lids are normal. Pupils are equal, round, and reactive to light. No foreign bodies found.  Neck: Trachea normal and normal range of motion. Neck supple.  Carotid bruit is not present. No mass and no thyromegaly present.  Cardiovascular: Normal rate, regular rhythm, S1 normal, S2 normal and intact distal pulses. Exam reveals no gallop.  Murmur heard.  Crescendo systolic  murmur is present with a grade of 3/6  Greatest at RUSB  Pulmonary/Chest: Effort normal and breath sounds normal. No respiratory distress. She has no wheezes. She has no rhonchi. She has no rales.  Abdominal: Soft. Normal appearance and bowel sounds are normal. She exhibits no distension, no fluid wave, no abdominal bruit and no mass. There is no hepatosplenomegaly. There is no tenderness. There is no rebound, no guarding and no CVA tenderness. No hernia.  Lymphadenopathy:  She has no cervical adenopathy.  She has no axillary adenopathy.  Neurological: She is alert. She has normal strength. No cranial nerve deficit or sensory deficit.  Skin: Skin is warm, dry and intact.  Psychiatric: She has a normal mood and affect. Her speech is normal and behavior is normal. Judgment and thought content normal. Her mood appears not anxious. Cognition and memory are normal. She does not exhibit a depressed mood.  Assessment & Plan:   AMW: The patient's preventative maintenance and recommended screening tests for an annual wellness exam were reviewed in full today.  Brought up to date unless services declined.  Counselled on the importance of diet, exercise, and its role in overall health and mortality.  The patient's FH and SH was reviewed, including their home life, tobacco status, and drug and alcohol status.   Vaccines: Flu uptodate . Uptodate with PNA, prevar shingles Td.  No indication for mammogram.  No indication for DVE/pap smear.  Colonoscopy: last in 2009.. No bowel issues.. Does not wish to repeat.  DXA: last 2010.Marland Kitchen Showed osteopenia in hip and spine... Refuses treatement so does not want to eval.  Taking CA, vit D per nephrologist.

## 2014-12-13 NOTE — Assessment & Plan Note (Signed)
Well controlled. Continue current medication.  

## 2014-12-13 NOTE — Assessment & Plan Note (Signed)
Insurance no longer covering welchol. Cannot afford. Will change to colestipol and titrate up when she runs out of welchol in 3 months. Next lab check in 6 months.

## 2014-12-13 NOTE — Patient Instructions (Addendum)
When you are out of welchol plan to start colestipol.  Stop at lab on way out for thyroid test.  Keep working on healthy eating and regular exercise.

## 2014-12-13 NOTE — Assessment & Plan Note (Signed)
Due for re-eval on levothyroxine    

## 2014-12-13 NOTE — Progress Notes (Signed)
Pre visit review using our clinic review tool, if applicable. No additional management support is needed unless otherwise documented below in the visit note. 

## 2014-12-16 ENCOUNTER — Telehealth: Payer: Self-pay

## 2014-12-16 NOTE — Telephone Encounter (Signed)
Patient stated that when she got up Saturday morning she felt real swimmy headed, a little ringing in the ears and real tired. Patient stated that she just took it easy during the day. Patient stated that Sunday her BP was 161/84 and later it was 152/82. Patient stated that she is still swimmy headed today, but better.  Patient stated that her BP this morning was 155/79 then 140/82. Patient wanted to see if you think that this was caused by the Prevnar shot? Patient stated that she is feeling some better today. Offered patient an appointment which she declined stating that she does not think that she needs to come, but will call back if symptoms get worse.

## 2014-12-16 NOTE — Telephone Encounter (Signed)
Very unlikely to be from [place].   i would just monitor BP once a day the next few days and see how she feels

## 2014-12-16 NOTE — Telephone Encounter (Signed)
Pt left v/m; pt was seen 12/13/14 and received prevnar 13; on 12/14/14 pt had dizziness and thinks BP went up. 12/16/14 today at 7 Am  BP 140/82. Today dizziness is not quite as bad. No H/a, SOB or CP. Pt wants to know if symptoms will go away. Pt said on Sat night had ringing in ears for short time. Pt request cb. If pt condition changes or worsens prior to cb pt will call [place].

## 2014-12-16 NOTE — Telephone Encounter (Signed)
Please call and evaluate. This message is not fully clear to me.

## 2014-12-16 NOTE — Telephone Encounter (Signed)
Patient notified as instructed by telephone and verbalized understanding. 

## 2014-12-16 NOTE — Telephone Encounter (Signed)
Pt notified of Dr. Lillie Fragmin recommendations and verbalized understanding

## 2014-12-16 NOTE — Telephone Encounter (Signed)
As above.

## 2014-12-17 ENCOUNTER — Telehealth: Payer: Self-pay | Admitting: *Deleted

## 2014-12-17 NOTE — Telephone Encounter (Signed)
[name] notified as instructed by telephone.  Lab appointment scheduled for 12/18/2014 at 8:35am.

## 2014-12-17 NOTE — Telephone Encounter (Signed)
-----   Message from Jinny Sanders, MD sent at 12/16/2014 11:19 PM EDT ----- Please call pt. Have her return for free t3 and t4 this week at her convenience. We may need to adjust her medication.

## 2014-12-18 ENCOUNTER — Other Ambulatory Visit: Payer: 59

## 2014-12-19 ENCOUNTER — Other Ambulatory Visit (INDEPENDENT_AMBULATORY_CARE_PROVIDER_SITE_OTHER): Payer: Medicare Other

## 2014-12-19 DIAGNOSIS — E039 Hypothyroidism, unspecified: Secondary | ICD-10-CM

## 2014-12-19 DIAGNOSIS — I519 Heart disease, unspecified: Principal | ICD-10-CM

## 2014-12-19 LAB — T3, FREE: T3, Free: 2.7 pg/mL (ref 2.3–4.2)

## 2014-12-19 LAB — T4, FREE: Free T4: 1.03 ng/dL (ref 0.60–1.60)

## 2014-12-25 ENCOUNTER — Ambulatory Visit: Payer: Self-pay | Admitting: Cardiovascular Disease

## 2014-12-26 ENCOUNTER — Other Ambulatory Visit (HOSPITAL_COMMUNITY): Payer: Medicare Other

## 2014-12-26 ENCOUNTER — Ambulatory Visit: Payer: Medicare Other | Admitting: Vascular Surgery

## 2015-01-21 ENCOUNTER — Encounter: Payer: Self-pay | Admitting: Vascular Surgery

## 2015-01-23 ENCOUNTER — Encounter: Payer: Self-pay | Admitting: Vascular Surgery

## 2015-01-23 ENCOUNTER — Ambulatory Visit (INDEPENDENT_AMBULATORY_CARE_PROVIDER_SITE_OTHER): Payer: Medicare Other | Admitting: Vascular Surgery

## 2015-01-23 ENCOUNTER — Ambulatory Visit (HOSPITAL_COMMUNITY)
Admission: RE | Admit: 2015-01-23 | Discharge: 2015-01-23 | Disposition: A | Discharge status: Home / Self Care | Payer: Medicare Other | Source: Ambulatory Visit | Attending: Vascular Surgery | Admitting: Vascular Surgery

## 2015-01-23 VITALS — BP 133/87 | HR 69 | Ht 65.5 in | Wt 179.0 lb

## 2015-01-23 DIAGNOSIS — I712 Thoracic aortic aneurysm, without rupture, unspecified: Secondary | ICD-10-CM

## 2015-01-23 DIAGNOSIS — I6521 Occlusion and stenosis of right carotid artery: Secondary | ICD-10-CM

## 2015-01-23 DIAGNOSIS — I6523 Occlusion and stenosis of bilateral carotid arteries: Secondary | ICD-10-CM | POA: Diagnosis not present

## 2015-01-23 NOTE — Progress Notes (Signed)
    Established Carotid Patient  History of Present Illness  [name] is a 81 y.o. (February 22, 1928) female who presents for follow-up for a descending thoracic aortic aneurysm on CT scan.  She denies any chest pain.  She has known high-grade stenosis of the right internal carotid artery with high bifurcation. She was not considered a good carotid stenting due to her renal insufficiency and risky carotid endarterectomy candidate due to her severe coronary disease.   Previous carotid studies demonstrated: RICA 01-09% stenosis,  LICA <32% stenosis.  Patient has no recent history of TIA or stroke symptom.  The patient denies amaurosis fugax or monocular blindness.  The patient has not had facial drooping or hemiplegia.  The patient has not had receptive or expressive aphasia.  She is on aspirin, plavix and a statin      Physical Examination    Filed Vitals:   01/23/15 1442 01/23/15 1444  BP: 133/66 133/87  Pulse: 69   Height: 5' 5.5" (1.664 m)   Weight: 179 lb (81.194 kg)   SpO2: 95%     General: A&O x 3, WD elderly female in NAD  Pulmonary: Sym exp, good air movt, CTAB, no rales, rhonchi, & wheezing  Cardiac: RRR, Nl S1, S2, systolic murmur, soft right carotid bruit, no left carotid bruit  Vascular: 2+ radial pulses bilaterally  Neurologic: CN 2-12 grossly intact  Non-Invasive Vascular Imaging  CAROTID DUPLEX today  R ICA stenosis: >80%  R VA:  patent and antegrade  L ICA stenosis: <40%  L VA:  patent and antegrade  I reviewed and interpreted this study.  Medical Decision Making  [name] has known high grade 80-99% right internal carotid artery stenosis with high bifurcation and severe coronary artery disease. She is not a carotid stent candidate due to her renal insufficiency and risky candidate for carotid endarterectomy due to her coronary disease. She describes no recent TIA or stroke symptoms since her last office visit. She will continue on maximal  medical management with aspirin, plavix, statin and strict control of blood pressure. She will follow up in six months for carotid duplex studies.  However, if she develops symptoms of TIA amaurosis or stroke we would need to consider whether or not carotid endarterectomy or carotid stenting would be in her best interest.  Patient has a known 4.2 cm descending thoracic aortic aneurysm. She most likely would not be a candidate for repair of this due to her underlying renal insufficiency and age. However she probably needs a repeat CT scan of the chest without contrast in 6 months.      [name], MD [facility] Office: [phone] Pager: [phone]

## 2015-01-23 NOTE — Addendum Note (Signed)
Addended by: Dorthula Rue L on: 01/23/2015 04:05 PM   Modules accepted: Orders

## 2015-02-18 ENCOUNTER — Encounter (HOSPITAL_COMMUNITY): Payer: Self-pay | Admitting: Emergency Medicine

## 2015-02-18 ENCOUNTER — Emergency Department (HOSPITAL_COMMUNITY): Payer: Medicare Other

## 2015-02-18 ENCOUNTER — Emergency Department (HOSPITAL_COMMUNITY)
Admission: EM | Admit: 2015-02-18 | Discharge: 2015-02-19 | Disposition: A | Discharge status: Home / Self Care | Payer: Medicare Other | Attending: Emergency Medicine | Admitting: Emergency Medicine

## 2015-02-18 DIAGNOSIS — Z87891 Personal history of nicotine dependence: Secondary | ICD-10-CM | POA: Insufficient documentation

## 2015-02-18 DIAGNOSIS — K219 Gastro-esophageal reflux disease without esophagitis: Secondary | ICD-10-CM | POA: Diagnosis not present

## 2015-02-18 DIAGNOSIS — J069 Acute upper respiratory infection, unspecified: Secondary | ICD-10-CM | POA: Insufficient documentation

## 2015-02-18 DIAGNOSIS — Z85828 Personal history of other malignant neoplasm of skin: Secondary | ICD-10-CM | POA: Insufficient documentation

## 2015-02-18 DIAGNOSIS — I10 Essential (primary) hypertension: Secondary | ICD-10-CM | POA: Diagnosis not present

## 2015-02-18 DIAGNOSIS — I2510 Atherosclerotic heart disease of native coronary artery without angina pectoris: Secondary | ICD-10-CM | POA: Diagnosis not present

## 2015-02-18 DIAGNOSIS — I252 Old myocardial infarction: Secondary | ICD-10-CM | POA: Diagnosis not present

## 2015-02-18 DIAGNOSIS — R05 Cough: Secondary | ICD-10-CM

## 2015-02-18 DIAGNOSIS — Z79899 Other long term (current) drug therapy: Secondary | ICD-10-CM | POA: Diagnosis not present

## 2015-02-18 DIAGNOSIS — E785 Hyperlipidemia, unspecified: Secondary | ICD-10-CM | POA: Insufficient documentation

## 2015-02-18 DIAGNOSIS — E039 Hypothyroidism, unspecified: Secondary | ICD-10-CM | POA: Diagnosis not present

## 2015-02-18 DIAGNOSIS — Z7982 Long term (current) use of aspirin: Secondary | ICD-10-CM | POA: Insufficient documentation

## 2015-02-18 DIAGNOSIS — Z7902 Long term (current) use of antithrombotics/antiplatelets: Secondary | ICD-10-CM | POA: Insufficient documentation

## 2015-02-18 DIAGNOSIS — R059 Cough, unspecified: Secondary | ICD-10-CM

## 2015-02-18 DIAGNOSIS — F329 Major depressive disorder, single episode, unspecified: Secondary | ICD-10-CM | POA: Diagnosis not present

## 2015-02-18 NOTE — ED Notes (Signed)
Pt. reports " head cold " with sinus pressure , chest congestion and productive cough onset last week , denies fever or chills.

## 2015-02-19 MED ORDER — IPRATROPIUM-ALBUTEROL 0.5-2.5 (3) MG/3ML IN SOLN
RESPIRATORY_TRACT | Status: DC
Start: 2015-02-19 — End: 2015-02-19
  Filled 2015-02-19: qty 3

## 2015-02-19 NOTE — ED Notes (Signed)
Pt stable, ambulatory, states understanding of discharge iinstructions 

## 2015-02-19 NOTE — ED Provider Notes (Signed)
CSN: [id number]     Arrival date & time 02/18/15  October 20, 1953 History   This chart was scribed for Delora Fuel, MD by Randa Evens, ED Scribe. This patient was seen in room B19C/B19C and the patient's care was started at 1:59 AM.    Chief Complaint  Patient presents with  . Cough   Patient is a 81 y.o. female presenting with cough. The history is provided by the patient. No language interpreter was used.  Cough Associated symptoms: chills   Associated symptoms: no fever, no myalgias and no sore throat    HPI Comments: [name] is a 81 y.o. female who presents to the Emergency Department complaining of productive cough onset 6 days prior. Pt states she has associated sinus congestion, chill and sweats. Pt doesn't report any medications PTA. Pt denies fever, SOB, n/v/d, myalgias or sore throat.   Past Medical History  Diagnosis Date  . Aortic stenosis     Moderate; Echo 12/09. moderate AS mean 19. AVA 0.88  . PAD (peripheral artery disease)     s/p iliac stents  . HTN (hypertension)   . Hyperparathyroidism   . Hypothyroidism   . GERD (gastroesophageal reflux disease)   . Depression 10/19/04    With death of spouse  . Ventral hernia     Moderate sized  . CAD (coronary artery disease)     s/p NSTEMI 1/07 with BMS to OM; cath 12/09 due to positive Myoview, LM ok. LAD 40%. LCX 80-90% mid. RCA chronically occluded with R --> L collats  . AAA (abdominal aortic aneurysm)     Recurrent; s/p repair, now 3.8 cam; h/o ischemic bowel 19-Oct-2004  . Carotid artery stenosis     R 60-79% L 0-39% (6/10) - stable  . Hyperlipidemia     Increased LFTs on Vytorin. Changed to Lipitor.  Marland Kitchen CRI (chronic renal insufficiency)     Mild, Cr 1.3  . Renal artery stenosis     Atrophic L kidney. Moderate blockage on R  . Basal cell carcinoma of nose 1980s  . Incontinence   . Full dentures   . Bruises easily   . Myocardial infarction 10/19/05  . Vitamin D deficiency    Past Surgical History  Procedure Laterality  Date  . Cholecystectomy  07/01/2005  . Breast biospy  1996  . Appendectomy  1954  . Tonsillectomy  19-Oct-1941  . Hernia repair  1997  . Coronary stent placement    . Breast surgery    . Abdominal aortic aneurysm repair  1995, 07/01/2005  . Angioplasty  10/20/2003 - 07/01/2005  . Iliac artery stent  Oct 20, 2003 - 07/01/2005    Stent placement, both legs - PVD in arteries  . Colonoscopy  07/07/2005    Colonic erosions, ulcers proctitis  . Upper gastrointestinal endoscopy  07/07/2005    Nml  . Nstemi  08/13/2005    1/07 s/p stent in circumflex  . Colonoscopy w/ endoscopic [place]  07/01/2005    [place] kidney diffuse atrophy  . Cholecystectomy  1987  . Hosp  04/2005    ? abd. prob. mesenteric ischemia  . Coronary stent placement  07/2005    MI   . Hosp  01/2006    Hosp for reflux (chest pain)   Family History  Problem Relation Age of Onset  . Heart attack Mother     MI  . Cancer Mother   . Heart disease Mother     heart attack  . Stroke Mother   .  Cancer Father     throat  . Heart attack Father     MI  . Throat cancer Father   . Heart disease Father     heart attack   . Stroke Father   . Cancer Sister     breast  . Breast cancer Sister   . Hyperlipidemia Sister   . Colon cancer Neg Hx   . Hyperlipidemia Sister   . Hypertension Sister   . Hyperlipidemia Son    Social History  Substance Use Topics  . Smoking status: Former Smoker    Quit date: 07/12/2005  . Smokeless tobacco: Never Used     Comment: quit in 1997  . Alcohol Use: No   OB History    No data available     Review of Systems  Constitutional: Positive for chills. Negative for fever.  HENT: Positive for congestion. Negative for sore throat.   Respiratory: Positive for cough.   Gastrointestinal: Negative for nausea, vomiting and diarrhea.  Musculoskeletal: Negative for myalgias.      Allergies  Fish oil; Naproxen; Tramadol; Codeine; Morphine sulfate; and Sulfonamide derivatives  Home Medications   Prior to  Admission medications   Medication Sig Start Date End Date Taking? Authorizing Provider  acetaminophen (TYLENOL) 500 MG tablet Take 1,000 mg by mouth 2 (two) times daily as needed for pain.    Historical Provider, MD  aspirin EC 81 MG tablet Take 81 mg by mouth at bedtime.    Historical Provider, MD  atorvastatin (LIPITOR) 80 MG tablet TAKE 1 TABLET DAILY 09/26/14   Amy Cletis Athens, MD  Cholecalciferol 1000 UNITS capsule Take 3,000 Units by mouth daily.    Historical Provider, MD  clopidogrel (PLAVIX) 75 MG tablet TAKE 1 TABLET DAILY 11/14/14   Amy Cletis Athens, MD  colestipol (COLESTID) 1 G tablet Take 2 g by mouth 2 (two) times daily.    Historical Provider, MD  estradiol (ESTRACE VAGINAL) 0.1 MG/GM vaginal cream Place 2 g vaginally 2 (two) times a week. Thursday and Sunday    Historical Provider, MD  Febuxostat (ULORIC) 80 MG TABS Take 80 mg by mouth daily.    Historical Provider, MD  isosorbide mononitrate (IMDUR) 30 MG 24 hr tablet TAKE 1 TABLET TWICE A DAY 11/14/14   Minna Merritts, MD  levothyroxine (SYNTHROID, LEVOTHROID) 112 MCG tablet TAKE 1 TABLET DAILY 07/28/14   Amy Cletis Athens, MD  lisinopril (PRINIVIL,ZESTRIL) 10 MG tablet Take 5 mg by mouth daily.    Historical Provider, MD  metoprolol tartrate (LOPRESSOR) 25 MG tablet Take one-half (1/2) tablet twice a day. 05/14/14   Minna Merritts, MD   BP 136/80 mmHg  Pulse 64  Temp(Src) 97.5 F (36.4 C) (Oral)  Resp 16  Ht 5\' 6"  (1.676 m)  Wt 176 lb (79.833 kg)  BMI 28.42 kg/m2  SpO2 99%   Physical Exam  Constitutional: She is oriented to person, place, and time. She appears well-developed and well-nourished. No distress.  HENT:  Head: Normocephalic and atraumatic.  Eyes: Conjunctivae and EOM are normal. Pupils are equal, round, and reactive to light.  Neck: Normal range of motion. Neck supple. No JVD present.  Cardiovascular: Normal rate, regular rhythm and normal heart sounds.   No murmur heard. Pulmonary/Chest: Effort normal and breath  sounds normal. She has no wheezes. She has no rales. She exhibits no tenderness.  Prolonged exhalation phase.  Abdominal: Soft. Bowel sounds are normal. She exhibits no distension and no mass. There is no tenderness.  Musculoskeletal: Normal range of motion. She exhibits no edema.  Lymphadenopathy:    She has no cervical adenopathy.  Neurological: She is alert and oriented to person, place, and time. No cranial nerve deficit. She exhibits normal muscle tone. Coordination normal.  Skin: Skin is warm and dry. No rash noted.  Psychiatric: She has a normal mood and affect. Her behavior is normal. Judgment and thought content normal.  Nursing note and vitals reviewed.   ED Course  Procedures (including critical care time) DIAGNOSTIC STUDIES: Oxygen Saturation is 99% on RA, normal by my interpretation.    COORDINATION OF CARE: 2:01 AM-Discussed treatment plan with pt at bedside and pt agreed to plan.     Imaging Review Dg Chest 2 View  02/18/2015   CLINICAL DATA:  One week history of productive cough and chest congestion  EXAM: CHEST  2 VIEW  COMPARISON:  July 09, 2014 chest radiograph and chest CT May 30, 2014  FINDINGS: There is no edema or consolidation. The heart size and pulmonary vascularity are normal. The aorta is diffusely prominent and tortuous, stable compared to previous studies. Note that prior CT examination demonstrated descending thoracic aortic aneurysm pain. No adenopathy. No bone lesions.  IMPRESSION: Descending thoracic aortic aneurysm, not appreciably changed compared to prior studies. No lung edema or consolidation. No change in cardiac silhouette.   Electronically Signed   By: Lowella Grip III M.D.   On: 02/18/2015 21:37    MDM   Final diagnoses:  Cough  Upper respiratory infection      Upper respiratory infection with cough. X-rays show no evidence of pneumonia. There is some suggestion of bronchospasm, so she will be given a therapeutic trial of  albuterol.  Following albuterol with ipratropium via nebulizer, she states that she feels better and is raising sputum more effectively. No indication for anybody except this point. She is discharged with a prescription for albuterol inhaler.   I personally performed the services described in this documentation, which was scribed in my presence. The recorded information has been reviewed and is accurate.       Delora Fuel, MD 82/06/01 [id number]

## 2015-04-16 ENCOUNTER — Other Ambulatory Visit: Payer: Self-pay | Admitting: Cardiovascular Disease

## 2015-04-21 ENCOUNTER — Telehealth: Payer: Self-pay | Admitting: *Deleted

## 2015-04-21 MED ORDER — COLESTIPOL HCL 1 G PO TABS: 2.0000 g | ORAL_TABLET | Freq: Two times a day (BID) | ORAL | Status: DC

## 2015-04-21 NOTE — Telephone Encounter (Addendum)
[name] stopped by office and ask that we send in a 90 day supply of Colestipol 1g to [facility].  Prescription sent electronically as requested.  [name] notified Rx has been sent to [facility] as requested.

## 2015-04-25 ENCOUNTER — Ambulatory Visit: Payer: Medicare Other

## 2015-04-25 NOTE — Telephone Encounter (Signed)
Ms. Doffing called this morning requesting call back from Butch Penny about new medication called in   [facility] # [phone] or [phone]

## 2015-04-25 NOTE — Telephone Encounter (Signed)
Spoke with [name].  She was concerned that she received 360 tablets of the Colestipol 1 g from [facility].  I advised that was correct because her instructions is to take 2 tablets 2 times a day.  She did not realize that she was to take 2 tablets twice a day so now she understands why she got so much.  She is also concerned that the tablets are so big but she did say it says she can dissolve the tablets in a small amount of water if there is trouble swallowing the tablets.She states she will finish her last few tablets of Welchol and then start the Colestipol.  If she has any problems taking them she will let us know.

## 2015-04-28 ENCOUNTER — Other Ambulatory Visit: Payer: Self-pay | Admitting: Family Medicine

## 2015-06-03 ENCOUNTER — Telehealth: Payer: Self-pay | Admitting: Family Medicine

## 2015-06-03 DIAGNOSIS — M1A9XX0 Chronic gout, unspecified, without tophus (tophi): Secondary | ICD-10-CM

## 2015-06-03 DIAGNOSIS — E785 Hyperlipidemia, unspecified: Secondary | ICD-10-CM

## 2015-06-03 DIAGNOSIS — E039 Hypothyroidism, unspecified: Secondary | ICD-10-CM

## 2015-06-03 DIAGNOSIS — E559 Vitamin D deficiency, unspecified: Secondary | ICD-10-CM

## 2015-06-03 NOTE — Telephone Encounter (Signed)
-----   Message from Ellamae Sia sent at 05/29/2015  2:58 PM EST ----- Regarding: Lab orders for Tuesday, 11.29.16 Lab orders for a 6 month follow up appt

## 2015-06-10 ENCOUNTER — Other Ambulatory Visit (INDEPENDENT_AMBULATORY_CARE_PROVIDER_SITE_OTHER): Payer: Medicare Other

## 2015-06-10 DIAGNOSIS — M1A9XX0 Chronic gout, unspecified, without tophus (tophi): Secondary | ICD-10-CM

## 2015-06-10 DIAGNOSIS — E785 Hyperlipidemia, unspecified: Secondary | ICD-10-CM | POA: Diagnosis not present

## 2015-06-10 LAB — COMPREHENSIVE METABOLIC PANEL
ALBUMIN: 3.7 g/dL (ref 3.5–5.2)
ALK PHOS: 57 U/L (ref 39–117)
ALT: 14 U/L (ref 0–35)
AST: 17 U/L (ref 0–37)
BILIRUBIN TOTAL: 0.6 mg/dL (ref 0.2–1.2)
BUN: 31 mg/dL — ABNORMAL HIGH (ref 6–23)
CO2: 28 mEq/L (ref 19–32)
CREATININE: 1.46 mg/dL — AB (ref 0.40–1.20)
Calcium: 9.7 mg/dL (ref 8.4–10.5)
Chloride: 105 mEq/L (ref 96–112)
GFR: 36.14 mL/min — ABNORMAL LOW (ref >60.00)
GLUCOSE: 97 mg/dL (ref 70–99)
POTASSIUM: 4.7 meq/L (ref 3.5–5.1)
SODIUM: 142 meq/L (ref 135–145)
TOTAL PROTEIN: 6.5 g/dL (ref 6.0–8.3)

## 2015-06-10 LAB — LDL CHOLESTEROL, DIRECT: Direct LDL: 37 mg/dL

## 2015-06-10 LAB — LIPID PANEL
Cholesterol: 192 mg/dL (ref 0–200)
HDL: 38.7 mg/dL — AB (ref >39.00)
NONHDL: 152.96
Total CHOL/HDL Ratio: 5
Triglycerides: 251 mg/dL — ABNORMAL HIGH (ref 0.0–149.0)
VLDL: 50.2 mg/dL — ABNORMAL HIGH (ref 0.0–40.0)

## 2015-06-10 LAB — URIC ACID: URIC ACID, SERUM: 3.3 mg/dL (ref 2.4–7.0)

## 2015-06-17 ENCOUNTER — Ambulatory Visit: Payer: 59 | Admitting: Family Medicine

## 2015-06-17 ENCOUNTER — Ambulatory Visit (INDEPENDENT_AMBULATORY_CARE_PROVIDER_SITE_OTHER): Payer: Medicare Other | Admitting: Family Medicine

## 2015-06-17 ENCOUNTER — Encounter: Payer: Self-pay | Admitting: Family Medicine

## 2015-06-17 VITALS — BP 130/80 | HR 64 | Temp 97.4°F | Ht 65.5 in | Wt 178.2 lb

## 2015-06-17 DIAGNOSIS — E785 Hyperlipidemia, unspecified: Secondary | ICD-10-CM

## 2015-06-17 DIAGNOSIS — I6521 Occlusion and stenosis of right carotid artery: Secondary | ICD-10-CM | POA: Diagnosis not present

## 2015-06-17 DIAGNOSIS — I10 Essential (primary) hypertension: Secondary | ICD-10-CM | POA: Diagnosis not present

## 2015-06-17 DIAGNOSIS — N182 Chronic kidney disease, stage 2 (mild): Secondary | ICD-10-CM | POA: Diagnosis not present

## 2015-06-17 NOTE — Assessment & Plan Note (Signed)
Well controlled. Continue current medication.  

## 2015-06-17 NOTE — Progress Notes (Signed)
Pre visit review using our clinic review tool, if applicable. No additional management support is needed unless otherwise documented below in the visit note. 

## 2015-06-17 NOTE — Patient Instructions (Addendum)
Keep up heathy diet and regular exercise!

## 2015-06-17 NOTE — Assessment & Plan Note (Signed)
Stable followed by nephrology 

## 2015-06-17 NOTE — Assessment & Plan Note (Signed)
LDL at goal < 70 on atorvastatin and colestipol. I do not recommend being more aggressive to get trig down. Encouraged exercise, weight loss, healthy eating habits. Discussed ways to increase

## 2015-06-17 NOTE — Progress Notes (Signed)
Has noted some hearing loss in last year  Her sister passed away in last year. She is doing well with her mood though..  CRF: followed by Dr. Zara Chess, Cr stable 1.3-1.5 last appt Reviewed.  Hypertension: Well controlled on current meds.  BP Readings from Last 3 Encounters:  06/17/15 130/80  02/19/15 128/62  01/23/15 133/87   Using medications without problems:None  Muscle aches: None   no chest pain, no SOB.  Wt Readings from Last 3 Encounters:  06/17/15 178 lb 4 oz (80.854 kg)  02/18/15 176 lb (79.833 kg)  01/23/15 179 lb (81.194 kg)   Elevated Cholesterol: LDL almost at goal < 70 on colestipol and atorvastatin 80 mg  She had indigestion with fish oil.  Lab Results  Component Value Date   CHOL 192 06/10/2015   HDL 38.70* 06/10/2015   LDLCALC 86 11/20/2014   LDLDIRECT 37.0 06/10/2015   TRIG 251.0* 06/10/2015   CHOLHDL 5 06/10/2015  Using medications without problems: none Muscle aches: None Diet compliance: Good, eating more oatmeal. Some fried foods and [place]. Exercise: daily walking Other complaints:   CAD: Followed by Dr. Rockey Situ.Has appt in 07/2015  Carotid dopplers 01/2015 critical stenosis 80-99% on right, 40-59% on left: Dr. Kathreen Cornfield vascular. Holding on CEA given risks.  No dizziness, no vision changes. Asymptomatic PVD.  Had pessary changed by Dr. Tresa Moore today. Helping with uterine prolapse.    Review of Systems  Constitutional: Negative for fever, fatigue and unexpected weight change.  HENT: Negative for ear pain, congestion, sore throat, sneezing, trouble swallowing and sinus pressure.  Eyes: Negative for pain and itching.  Respiratory: Negative for cough, shortness of breath and wheezing.  Cardiovascular: Negative for chest pain, palpitations and leg swelling.  Gastrointestinal: Negative for nausea, abdominal pain, diarrhea, constipation and blood in stool.  Genitourinary: Negative for dysuria, hematuria, vaginal  discharge, difficulty urinating and menstrual problem.  Skin: Negative for rash.  Neurological: Negative for syncope, weakness, light-headedness, numbness and headaches.  Psychiatric/Behavioral: Negative for confusion and dysphoric mood. The patient is not nervous/anxious.  Objective:   Physical Exam  Constitutional: Vital signs are normal. She appears well-developed and well-nourished. She is cooperative. Non-toxic appearance. She does not appear ill. No distress.  Elderly female  HENT:  Head: Normocephalic.  Right Ear: Hearing, tympanic membrane, external ear and ear canal normal.  Left Ear: Hearing, tympanic membrane, external ear and ear canal normal.  Nose: Nose normal.  Eyes: Conjunctivae, EOM and lids are normal. Pupils are equal, round, and reactive to light. No foreign bodies found.  Neck: Trachea normal and normal range of motion. Neck supple. Carotid bruit is not present. No mass and no thyromegaly present.  Cardiovascular: Normal rate, regular rhythm, S1 normal, S2 normal and intact distal pulses. Exam reveals no gallop.  Murmur heard.  Crescendo systolic murmur is present with a grade of 3/6  Greatest at RUSB  Pulmonary/Chest: Effort normal and breath sounds normal. No respiratory distress. She has no wheezes. She has no rhonchi. She has no rales.  Abdominal: Soft. Normal appearance and bowel sounds are normal. She exhibits no distension, no fluid wave, no abdominal bruit and no mass. There is no hepatosplenomegaly. There is no tenderness. There is no rebound, no guarding and no CVA tenderness. No hernia.  Lymphadenopathy:  She has no cervical adenopathy.  She has no axillary adenopathy.  Neurological: She is alert. She has normal strength. No cranial nerve deficit or sensory deficit.  Skin: Skin is warm, dry and intact.  Psychiatric:  She has a normal mood and affect. Her speech is normal and behavior is normal. Judgment and thought content normal. Her mood  appears not anxious. Cognition and memory are normal. She does not exhibit a depressed mood.

## 2015-06-17 NOTE — Assessment & Plan Note (Signed)
Medical management, followed by vascular.

## 2015-06-27 ENCOUNTER — Other Ambulatory Visit: Payer: Self-pay

## 2015-06-27 DIAGNOSIS — I712 Thoracic aortic aneurysm, without rupture, unspecified: Secondary | ICD-10-CM

## 2015-07-22 ENCOUNTER — Other Ambulatory Visit: Payer: Self-pay

## 2015-07-22 MED ORDER — CLOPIDOGREL BISULFATE 75 MG PO TABS: 75.0000 mg | ORAL_TABLET | Freq: Every day | ORAL | Status: DC

## 2015-07-22 NOTE — Telephone Encounter (Signed)
[facility] Last seen 06/17/15 done per protocol.

## 2015-07-25 ENCOUNTER — Ambulatory Visit
Admission: RE | Admit: 2015-07-25 | Discharge: 2015-07-25 | Disposition: A | Discharge status: Home / Self Care | Payer: Medicare Other | Source: Ambulatory Visit | Attending: Vascular Surgery | Admitting: Vascular Surgery

## 2015-07-25 ENCOUNTER — Inpatient Hospital Stay: Admission: RE | Admit: 2015-07-25 | Payer: Medicare Other | Source: Ambulatory Visit

## 2015-07-25 DIAGNOSIS — I712 Thoracic aortic aneurysm, without rupture, unspecified: Secondary | ICD-10-CM

## 2015-07-30 ENCOUNTER — Encounter: Payer: Self-pay | Admitting: Vascular Surgery

## 2015-07-31 ENCOUNTER — Encounter: Payer: Self-pay | Admitting: Vascular Surgery

## 2015-07-31 ENCOUNTER — Ambulatory Visit (HOSPITAL_COMMUNITY)
Admission: RE | Admit: 2015-07-31 | Discharge: 2015-07-31 | Disposition: A | Discharge status: Home / Self Care | Payer: Medicare Other | Source: Ambulatory Visit | Attending: Vascular Surgery | Admitting: Vascular Surgery

## 2015-07-31 ENCOUNTER — Ambulatory Visit (INDEPENDENT_AMBULATORY_CARE_PROVIDER_SITE_OTHER): Payer: Medicare Other | Admitting: Vascular Surgery

## 2015-07-31 VITALS — BP 140/77 | HR 54 | Temp 97.5°F | Resp 14 | Ht 66.0 in | Wt 176.0 lb

## 2015-07-31 DIAGNOSIS — I10 Essential (primary) hypertension: Secondary | ICD-10-CM | POA: Diagnosis not present

## 2015-07-31 DIAGNOSIS — I712 Thoracic aortic aneurysm, without rupture, unspecified: Secondary | ICD-10-CM

## 2015-07-31 DIAGNOSIS — E785 Hyperlipidemia, unspecified: Secondary | ICD-10-CM | POA: Diagnosis not present

## 2015-07-31 DIAGNOSIS — I6521 Occlusion and stenosis of right carotid artery: Secondary | ICD-10-CM | POA: Diagnosis present

## 2015-07-31 NOTE — Progress Notes (Signed)
Filed Vitals:   07/31/15 1327 07/31/15 1330 07/31/15 1336  BP: 142/86 137/79 140/77  Pulse: 52 54 54  Temp:  97.5 F (36.4 C)   TempSrc:  Oral   Resp:  14   Height:  5\' 6"  (1.676 m)   Weight:  176 lb (79.833 kg)   SpO2:  100%

## 2015-07-31 NOTE — Addendum Note (Signed)
Addended by: Mena Goes on: 07/31/2015 03:26 PM   Modules accepted: Orders

## 2015-07-31 NOTE — Progress Notes (Signed)
    Established Carotid Patient  History of Present Illness  [name] is a 82 y.o. (Nov 16, 1927) female who presents for follow-up for a descending thoracic aortic aneurysm on CT scan as well as a known high-grade right internal carotid artery stenosis. .  She denies any chest pain.  She has known high-grade stenosis of the right internal carotid artery with high bifurcation. She was not considered a good carotid stenting due to her renal insufficiency and risky carotid endarterectomy candidate due to her severe coronary disease.   Previous carotid studies demonstrated: RICA 123456 stenosis,  LICA [id number] stenosis.  Patient has no recent history of TIA or stroke symptom.  The patient denies amaurosis fugax or monocular blindness.  The patient has not had facial drooping or hemiplegia.  The patient has not had receptive or expressive aphasia.  She is on aspirin, plavix and a statin      Physical Examination    Filed Vitals:   07/31/15 1327 07/31/15 1330 07/31/15 1336  BP: 142/86 137/79 140/77  Pulse: 52 54 54  Temp:  97.5 F (36.4 C)   TempSrc:  Oral   Resp:  14   Height:  5\' 6"  (1.676 m)   Weight:  176 lb (79.833 kg)   SpO2:  100%     General: A&O x 3, WD elderly female in NAD  Pulmonary: Sym exp, good air movt, CTAB, no rales, rhonchi, & wheezing  Cardiac: RRR, Nl S1, S2, 3/6 systolic murmur, soft right carotid bruit, no left carotid bruit  Vascular: 2+ radial pulses bilaterally  Neurologic: CN 2-12 grossly intact  Non-Invasive Vascular Imaging Carotid duplex shows no change from July 2016. Velocities in the right internal carotid artery 510/197 greater than 80% less than 40% left  CT scan of the chest without contrast was reviewed which showed a small right pulmonary nodule, descending thoracic aneurysm 4.8 cm in size which represents a growth of about 6 mm over her last scan  Medical Decision Making  [name] has known high grade 80-99% right internal  carotid artery stenosis with high bifurcation and severe coronary artery disease. She is not a carotid stent candidate due to her renal insufficiency and risky candidate for carotid endarterectomy due to her coronary disease. She describes no recent TIA or stroke symptoms since her last office visit. She will continue on maximal medical management with aspirin, plavix, statin and strict control of blood pressure. She will follow-up in one year with repeat carotid duplex exam. However, if she develops symptoms of TIA amaurosis or stroke we would need to consider whether or not carotid endarterectomy or carotid stenting would be in her best interest.  Patient has a known 4.8 cm descending thoracic aortic aneurysm. She most likely would not be a candidate for repair of this due to her underlying renal insufficiency and age. However she probably needs a repeat CT scan of the chest without contrast in 12 months; if this continues to grow over time we would again consider whether or not it warrants repair .      [name], MD [facility] Office: [phone] Pager: [phone]

## 2015-08-11 ENCOUNTER — Encounter: Payer: Self-pay | Admitting: Cardiovascular Disease

## 2015-08-11 ENCOUNTER — Ambulatory Visit (INDEPENDENT_AMBULATORY_CARE_PROVIDER_SITE_OTHER): Payer: Medicare Other | Admitting: Cardiovascular Disease

## 2015-08-11 VITALS — BP 148/90 | HR 58 | Ht 66.0 in | Wt 176.5 lb

## 2015-08-11 DIAGNOSIS — I350 Nonrheumatic aortic (valve) stenosis: Secondary | ICD-10-CM | POA: Diagnosis not present

## 2015-08-11 DIAGNOSIS — N182 Chronic kidney disease, stage 2 (mild): Secondary | ICD-10-CM

## 2015-08-11 DIAGNOSIS — R011 Cardiac murmur, unspecified: Secondary | ICD-10-CM

## 2015-08-11 DIAGNOSIS — I10 Essential (primary) hypertension: Secondary | ICD-10-CM

## 2015-08-11 DIAGNOSIS — E785 Hyperlipidemia, unspecified: Secondary | ICD-10-CM | POA: Diagnosis not present

## 2015-08-11 DIAGNOSIS — I7123 Aneurysm of the descending thoracic aorta, without rupture: Secondary | ICD-10-CM

## 2015-08-11 DIAGNOSIS — I2510 Atherosclerotic heart disease of native coronary artery without angina pectoris: Secondary | ICD-10-CM | POA: Diagnosis not present

## 2015-08-11 DIAGNOSIS — I712 Thoracic aortic aneurysm, without rupture: Secondary | ICD-10-CM

## 2015-08-11 NOTE — Assessment & Plan Note (Signed)
Echocardiogram has been ordered to evaluate her aortic valve stenosis, last echo was in 2012

## 2015-08-11 NOTE — Progress Notes (Signed)
Patient ID: [name], female    DOB: 1928-01-11, 82 y.o.   MRN: [id number]  HPI Comments: [name] is an 82 year old woman with history of coronary artery disease, status post non-ST-elevation myocardial infarction in January 2007, treated with bare-metal stenting to the obtuse marginal,  totally occluded right coronary with distal collateralization and signifcant LCX disease which is being treated medically as it is not easily amenable to intervention. She has normal LV function, Peripheral vascular disease  with previous abdominal aortic aneurysm repair and bilateral iliac stenting, carotid artery stenosis (critical on the right) followed by Dr. Oneida Alar, renal artery stenosis with an atrophic left kidney and a moderate blockage on the right, hypertension and hyperlipidemia. Also with mild to moderate aortic valve  Stenosis in 2012. She presents for follow-up of her coronary artery disease She has a single kidney, followed by Dr. Abigail Butts, Moderate aortic valve stenosis in 2012  In follow-up today, she denies any symptoms concerning for angina Active, no significant shortness of breath She is followed by Dr. 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 She suffered an accident and fractured her fifth finger,  currently in a splint. No surgery needed  Tolerating her medications well, total cholesterol 134 in December 2014 She tolerated WelChol one pill twice a day.   she feels well with no complaints .   carotid ultrasound showed progression of the stenosis on the right. Estimate is 80-99%.  She has seen Dr. Oneida Alar with repeat followup and ultrasound in 2014. Plan was made to watch her closely with possible stenting with any symptoms of TIA or stroke   Aortic ultrasound showed tortuous ectatic aorta biiliac bypass graft. Maximum dimensions 3.5 x 3.3 cm, dilated left common iliac artery 2 cm Previous echocardiogram EF 55-65% Mild to moderate AS      Allergies  Allergen Reactions  . Fish Oil Other (See Comments)    Severe indigestion  . Naproxen Nausea And Vomiting  . Tramadol Nausea And Vomiting  . Codeine Nausea And Vomiting and Rash  . Morphine Sulfate Nausea And Vomiting and Rash  . Sulfonamide Derivatives Nausea And Vomiting and Rash    Outpatient Encounter Prescriptions as of 08/11/2015  Medication Sig  . acetaminophen (TYLENOL) 500 MG tablet Take 1,000 mg by mouth 2 (two) times daily as needed for pain.  Marland Kitchen aspirin EC 81 MG tablet Take 81 mg by mouth at bedtime.  Marland Kitchen atorvastatin (LIPITOR) 80 MG tablet TAKE 1 TABLET DAILY  . Cholecalciferol 1000 UNITS capsule Take 3,000 Units by mouth daily.  . clopidogrel (PLAVIX) 75 MG tablet Take 1 tablet (75 mg total) by mouth daily.  . colestipol (COLESTID) 1 G tablet Take 2 tablets (2 g total) by mouth 2 (two) times daily.  Marland Kitchen estradiol (ESTRACE VAGINAL) 0.1 MG/GM vaginal cream Place 2 g vaginally 2 (two)  times a week. Thursday and Sunday  . Febuxostat (ULORIC) 80 MG TABS Take 80 mg by mouth daily.  . isosorbide mononitrate (IMDUR) 30 MG 24 hr tablet TAKE 1 TABLET TWICE A DAY  . levothyroxine (SYNTHROID, LEVOTHROID) 112 MCG tablet TAKE 1 TABLET DAILY  . metoprolol tartrate (LOPRESSOR) 25 MG tablet TAKE ONE-HALF (1/2) TABLET TWICE A DAY   No  facility-administered encounter medications on file as of 08/11/2015.    Past Medical History  Diagnosis Date  . Aortic stenosis     Moderate; Echo 12/09. moderate AS mean 19. AVA 0.88  . PAD (peripheral artery disease) (HCC)     s/p iliac stents  . HTN (hypertension)   . Hyperparathyroidism   . Hypothyroidism   . GERD (gastroesophageal reflux disease)   . Depression 2004/11/05    With death of spouse  . Ventral hernia     Moderate sized  . CAD (coronary artery disease)     s/p NSTEMI 1/07 with BMS to OM; cath 12/09 due to positive Myoview, LM ok. LAD 40%. LCX 80-90% mid. RCA chronically occluded with R --> L collats  . AAA (abdominal aortic aneurysm) (HCC)     Recurrent; s/p repair, now 3.8 cam; h/o ischemic bowel November 05, 2004  . Carotid artery stenosis     R 60-79% L 0-39% (6/10) - stable  . Hyperlipidemia     Increased LFTs on Vytorin. Changed to Lipitor.  Marland Kitchen CRI (chronic renal insufficiency)     Mild, Cr 1.3  . Renal artery stenosis (HCC)     Atrophic L kidney. Moderate blockage on R  . Basal cell carcinoma of nose 1980s  . Incontinence   . Full dentures   . Bruises easily   . Myocardial infarction ([place]) 2005/11/05  . Vitamin D deficiency     Past Surgical History  Procedure Laterality Date  . Cholecystectomy  07/01/2005  . Breast biospy  1996  . Appendectomy  1954  . Tonsillectomy  11/05/41  . Hernia repair  1997  . Coronary stent placement    . Breast surgery    . Abdominal aortic aneurysm repair  1995, 07/01/2005  . Angioplasty  Nov 06, 2003 - 07/01/2005  . Iliac artery stent  11/06/03 - 07/01/2005    Stent placement, both legs - PVD in arteries  . Colonoscopy  07/07/2005    Colonic erosions, ulcers proctitis  . Upper gastrointestinal endoscopy  07/07/2005    Nml  . Nstemi  08/13/2005    1/07 s/p stent in circumflex  . Colonoscopy w/ endoscopic [place]  07/01/2005    [place] I kidney diffuse atrophy  . Cholecystectomy  1987  . Hosp  04/2005    ? abd. prob. mesenteric ischemia  . Coronary stent  placement  07/2005    MI   . [place]  01/2006    Hosp for reflux (chest pain)    Social History  reports that she quit smoking about 10 years ago. She has never used smokeless tobacco. She reports that she does not drink alcohol or use illicit drugs.  Family History family history includes Breast cancer in her sister; Cancer in her father, mother, and sister; Heart attack in her father and mother; Heart disease in her father and mother; Hyperlipidemia in her sister, sister, and son; Hypertension in her sister; Stroke in her father and mother; Throat cancer in her father. There is no history of Colon cancer.   Review of Systems  Constitutional: Negative.   Respiratory: Negative.   Cardiovascular: Negative.  Gastrointestinal: Negative.   Musculoskeletal: Negative.   Neurological: Negative.   Hematological: Negative.   Psychiatric/Behavioral: Negative.   All other systems reviewed and are negative.   BP 148/90 mmHg  Pulse 58  Ht 5\' 6"  (1.676 m)  Wt 176 lb 8 oz (80.06 kg)  BMI 28.50 kg/m2  Physical Exam  Constitutional: She is oriented to person, place, and time. She appears well-developed and well-nourished.  HENT:  Head: Normocephalic.  Nose: Nose normal.  Mouth/Throat: Oropharynx is clear and moist.  Eyes: Conjunctivae are normal. Pupils are equal, round, and reactive to light.  Neck: Normal range of motion. Neck supple. No JVD present. Carotid bruit is present.  Cardiovascular: Normal rate, regular rhythm, S1 normal, S2 normal and intact distal pulses.  Exam reveals no gallop and no friction rub.   Murmur heard.  Systolic murmur is present with a grade of 2/6  Pulses:      Dorsalis pedis pulses are 0 on the right side, and 0 on the left side.       Posterior tibial pulses are 0 on the right side, and 0 on the left side.  Pulmonary/Chest: Effort normal and breath sounds normal. No respiratory distress. She has no wheezes. She has no rales. She exhibits no tenderness.   Abdominal: Soft. Bowel sounds are normal. She exhibits no distension. There is no tenderness.  Musculoskeletal: Normal range of motion. She exhibits no edema or tenderness.  Lymphadenopathy:    She has no cervical adenopathy.  Neurological: She is alert and oriented to person, place, and time. Coordination normal.  Skin: Skin is warm and dry. No rash noted. No erythema.  Psychiatric: She has a normal mood and affect. Her behavior is normal. Judgment and thought content normal.    Assessment and Plan  Nursing note and vitals reviewed.

## 2015-08-11 NOTE — Assessment & Plan Note (Signed)
Currently with no symptoms of angina Stressed importance of aggressive cholesterol control She will call for any anginal symptoms

## 2015-08-11 NOTE — Patient Instructions (Addendum)
You are doing well. No medication changes were made.  We will schedule an echocardiogram for aortic valve stenosis, murmur Echocardiography is a painless test that uses sound waves to create images of your heart. It provides your doctor with information about the size and shape of your heart and how well your heart's chambers and valves are working. This procedure takes approximately one hour. There are no restrictions for this procedure.  Date & time: ________________________________________   Please call us if you have new issues that need to be addressed before your next appt.  Your physician wants you to follow-up in: 6 months.  You will receive a reminder letter in the mail two months in advance. If you don't receive a letter, please call our office to schedule the follow-up appointment.  Echocardiogram An echocardiogram, or echocardiography, uses sound waves (ultrasound) to produce an image of your heart. The echocardiogram is simple, painless, obtained within a short period of time, and offers valuable information to your health care provider. The images from an echocardiogram can provide information such as:  Evidence of coronary artery disease (CAD).  Heart size.  Heart muscle function.  Heart valve function.  Aneurysm detection.  Evidence of a past heart attack.  Fluid buildup around the heart.  Heart muscle thickening.  Assess heart valve function. LET [facility] CARE PROVIDER KNOW ABOUT:  Any allergies you have.  All medicines you are taking, including vitamins, herbs, eye drops, creams, and over-the-counter medicines.  Previous problems you or members of your family have had with the use of anesthetics.  Any blood disorders you have.  Previous surgeries you have had.  Medical conditions you have.  Possibility of pregnancy, if this applies. BEFORE THE PROCEDURE  No special preparation is needed. Eat and drink normally.  PROCEDURE   In order to produce  an image of your heart, gel will be applied to your chest and a wand-like tool (transducer) will be moved over your chest. The gel will help transmit the sound waves from the transducer. The sound waves will harmlessly bounce off your heart to allow the heart images to be captured in real-time motion. These images will then be recorded.  You may need an IV to receive a medicine that improves the quality of the pictures. AFTER THE PROCEDURE You may return to your normal schedule including diet, activities, and medicines, unless your health care provider tells you otherwise.   This information is not intended to replace advice given to you by your health care provider. Make sure you discuss any questions you have with your health care provider.   Document Released: 06/25/2000 Document Revised: 07/19/2014 Document Reviewed: 03/05/2013 Elsevier Interactive Patient Education [employer].

## 2015-08-11 NOTE — Assessment & Plan Note (Signed)
LDL decreased on recent lab check, total cholesterol increased Unclear if this is lab error We will evaluate next set of lab work when this becomes available Total cholesterol above goal, could add zetia

## 2015-08-11 NOTE — Assessment & Plan Note (Signed)
Single kidney, followed by Dr. Abigail Butts Creatinine 1.4, stable

## 2015-08-11 NOTE — Assessment & Plan Note (Signed)
Blood pressure borderline elevated on today's visit. She will monitor her blood pressure at home and call if this continues to run high. If needed, amlodipine or losartan could be added to Imdur.   Total encounter time more than 25 minutes  Greater than 50% was spent in counseling and coordination of care with the patient

## 2015-08-11 NOTE — Assessment & Plan Note (Signed)
4.8 cm on recent CT scan, followed in [facility]

## 2015-08-15 ENCOUNTER — Other Ambulatory Visit: Payer: Self-pay

## 2015-08-15 ENCOUNTER — Ambulatory Visit (INDEPENDENT_AMBULATORY_CARE_PROVIDER_SITE_OTHER): Payer: Medicare Other

## 2015-08-15 DIAGNOSIS — I2510 Atherosclerotic heart disease of native coronary artery without angina pectoris: Secondary | ICD-10-CM | POA: Diagnosis not present

## 2015-08-15 DIAGNOSIS — E785 Hyperlipidemia, unspecified: Secondary | ICD-10-CM

## 2015-08-15 DIAGNOSIS — I350 Nonrheumatic aortic (valve) stenosis: Secondary | ICD-10-CM

## 2015-08-15 DIAGNOSIS — R011 Cardiac murmur, unspecified: Secondary | ICD-10-CM | POA: Diagnosis not present

## 2015-10-31 ENCOUNTER — Other Ambulatory Visit: Payer: Self-pay

## 2015-10-31 MED ORDER — ATORVASTATIN CALCIUM 80 MG PO TABS: 80.0000 mg | ORAL_TABLET | Freq: Every day | ORAL | Status: DC

## 2015-10-31 NOTE — Telephone Encounter (Signed)
Pt request refill atorvastatin [facility]. Pt last seen 06/17/15. Refill done per protocol and pt notified and pt voiced understanding.

## 2015-11-03 ENCOUNTER — Ambulatory Visit (INDEPENDENT_AMBULATORY_CARE_PROVIDER_SITE_OTHER): Payer: Medicare Other | Admitting: Family Medicine

## 2015-11-03 ENCOUNTER — Encounter: Payer: Self-pay | Admitting: Family Medicine

## 2015-11-03 VITALS — BP 108/82 | HR 66 | Temp 98.1°F | Ht 66.0 in | Wt 175.8 lb

## 2015-11-03 DIAGNOSIS — J208 Acute bronchitis due to other specified organisms: Secondary | ICD-10-CM | POA: Diagnosis not present

## 2015-11-03 NOTE — Progress Notes (Signed)
Pre visit review using our clinic review tool, if applicable. No additional management support is needed unless otherwise documented below in the visit note. 

## 2015-11-03 NOTE — Progress Notes (Signed)
Dr. Frederico Hamman T. Chrissa Meetze, MD, [facility] [place], [address] Phone: [phone] Fax: [phone]  11/03/2015  Patient: [name], MRN: [id number], DOB: [birth year], 82 y.o.  Primary Physician:  Eliezer Lofts, MD   Chief Complaint  Patient presents with  . Nasal Congestion  . Cough  . Shortness of Breath  . Headache  . Sore Throat   Subjective:   [name] is a 82 y.o. very pleasant female patient who presents with the following:  Last year, had some pneumonia. Sits outside, then starts to drain into her chest. Hurting to cough and breathe. Has gotten much worse and shortness of breath is more.   Long time ago quit smoking 15 years ago.   She presents today not feeling well with nasal congestion, cough, some occasional shortness of breath, headache, sore throat, and in generally just not feeling all that well. She is eating and drinking okay.  Past Medical History, Surgical History, Social History, Family History, Problem List, Medications, and Allergies have been reviewed and updated if relevant.  Patient Active Problem List   Diagnosis Date Noted  . Counseling regarding end of life decision making 12/13/2014  . Community acquired pneumonia 05/30/2014  . Chronic kidney disease (CKD), stage II (mild) 05/30/2014  . Descending thoracic aortic aneurysm ([place]) 05/30/2014  . Vitamin D deficiency 12/26/2012  . Uterine prolapse 06/23/2012  . Gout 06/06/2012  . INSOMNIA, CHRONIC, MILD 07/10/2010  . SENILE OSTEOPOROSIS 07/08/2009  . Carotid stenosis 11/01/2008  . Aortic valve stenosis 09/25/2008  . Abdominal aortic aneurysm ([place]) 09/24/2008  . LOW BACK PAIN, MILD 07/24/2008  . Coronary atherosclerosis 06/21/2008  . VENTRAL HERNIA 05/16/2008  . PULMONARY NODULE, RIGHT MIDDLE LOBE 02/01/2008  . CYSTOCELE WITH INCOMPLETE UTERINE PROLAPSE 02/20/2007  . Hypothyroidism 02/17/2007  . Hyperlipidemia 02/17/2007    . Essential hypertension 02/17/2007  . PERIPHERAL VASCULAR DISEASE 02/17/2007  . GERD 02/17/2007  . Chronic renal insufficiency 02/17/2007  . ISCHEMIC COLITIS 05/19/2005    Past Medical History  Diagnosis Date  . Aortic stenosis     Moderate; Echo 12/09. moderate AS mean 19. AVA 0.88  . PAD (peripheral artery disease) (HCC)     s/p iliac stents  . HTN (hypertension)   . Hyperparathyroidism   . Hypothyroidism   . GERD (gastroesophageal reflux disease)   . Depression 09-17-2004    With death of spouse  . Ventral hernia     Moderate sized  . CAD (coronary artery disease)     s/p NSTEMI 1/07 with BMS to OM; cath 12/09 due to positive Myoview, LM ok. LAD 40%. LCX 80-90% mid. RCA chronically occluded with R --> L collats  . AAA (abdominal aortic aneurysm) (HCC)     Recurrent; s/p repair, now 3.8 cam; h/o ischemic bowel 09-17-2004  . Carotid artery stenosis     R 60-79% L 0-39% (6/10) - stable  . Hyperlipidemia     Increased LFTs on Vytorin. Changed to Lipitor.  Marland Kitchen CRI (chronic renal insufficiency)     Mild, Cr 1.3  . Renal artery stenosis (HCC)     Atrophic L kidney. Moderate blockage on R  . Basal cell carcinoma of nose 1980s  . Incontinence   . Full dentures   . Bruises easily   . Myocardial infarction ([place]) 09/17/2005  . Vitamin D deficiency     Past Surgical History  Procedure Laterality Date  . Cholecystectomy  07/01/2005  . Breast  biospy  1996  . Appendectomy  1954  . Tonsillectomy  1943  . Hernia repair  1997  . Coronary stent placement    . Breast surgery    . Abdominal aortic aneurysm repair  1995, 07/01/2005  . Angioplasty  2005 - 07/01/2005  . Iliac artery stent  2005 - 07/01/2005    Stent placement, both legs - PVD in arteries  . Colonoscopy  07/07/2005    Colonic erosions, ulcers proctitis  . Upper gastrointestinal endoscopy  07/07/2005    Nml  . Nstemi  08/13/2005    1/07 s/p stent in circumflex  . Colonoscopy w/ endoscopic [place]  07/01/2005    [place] I kidney diffuse  atrophy  . Cholecystectomy  1987  . Hosp  2005/04/29    ? abd. prob. mesenteric ischemia  . Coronary stent placement  07/2005    MI   . Hosp  01/2006    Hosp for reflux (chest pain)    Social History   Social History  . Marital Status: Widowed    Spouse Name: N/A  . Number of Children: N/A  . Years of Education: N/A   Occupational History  . Not on file.   Social History Main Topics  . Smoking status: Former Smoker    Quit date: 07/12/2005  . Smokeless tobacco: Never Used     Comment: quit in 1997  . Alcohol Use: No  . Drug Use: No  . Sexual Activity: Not on file   Other Topics Concern  . Not on file   Social History Narrative   Husband deceased 2005/04/29 from multiple myeloma, lives alone near sister..    Has living will, HCPOA: daughter: [name]   Full code. Has lifealert. (Reviewed 2014)             Family History  Problem Relation Age of Onset  . Heart attack Mother     MI  . Cancer Mother   . Heart disease Mother     heart attack  . Stroke Mother   . Cancer Father     throat  . Heart attack Father     MI  . Throat cancer Father   . Heart disease Father     heart attack   . Stroke Father   . Cancer Sister     breast  . Breast cancer Sister   . Hyperlipidemia Sister   . Colon cancer Neg Hx   . Hyperlipidemia Sister   . Hypertension Sister   . Hyperlipidemia Son     Allergies  Allergen Reactions  . Fish Oil Other (See Comments)    Severe indigestion  . Naproxen Nausea And Vomiting  . Tramadol Nausea And Vomiting  . Codeine Nausea And Vomiting and Rash  . Morphine Sulfate Nausea And Vomiting and Rash  . Sulfonamide Derivatives Nausea And Vomiting and Rash    Medication list reviewed and updated in full in [place].  ROS: GEN: Acute illness details above GI: Tolerating PO intake GU: maintaining adequate hydration and urination Pulm: No SOB Interactive and getting along well at home.  Otherwise, ROS is as per the  HPI.   Objective:   BP 108/82 mmHg  Pulse 66  Temp(Src) 98.1 F (36.7 C) (Oral)  Ht _0  (1.676 m)  Wt 175 lb 12 oz (79.72 kg)  BMI 28.38 kg/m2  SpO2 97%   GEN: A and O x 3. WDWN. NAD.    ENT: Nose clear, ext NML.  No  LAD.  No JVD.  TM's clear. Oropharynx clear.  PULM: Normal WOB, no distress. No crackles, wheezes, rhonchi. CV: RRR, no M/G/R, No rubs, No JVD.   EXT: warm and well-perfused, No c/c/e. PSYCH: Pleasant and conversant.    Laboratory and Imaging Data:  Assessment and Plan:   Acute viral bronchitis  Exam is reassuring. Lungs are clear. Pulse ox 97%. Reassured the patient. Mucinex prn. Tylenol.  Albuterol q 4 hours as needed - she has at home.  Follow-up: No Follow-up on file.  Signed,  Maud Deed. Neftaly Inzunza, MD   Patient's Medications  New Prescriptions   No medications on file  Previous Medications   ACETAMINOPHEN (TYLENOL) 500 MG TABLET    Take 1,000 mg by mouth 2 (two) times daily as needed for pain.   ASPIRIN EC 81 MG TABLET    Take 81 mg by mouth at bedtime.   ATORVASTATIN (LIPITOR) 80 MG TABLET    Take 1 tablet (80 mg total) by mouth daily.   CHOLECALCIFEROL 1000 UNITS CAPSULE    Take 3,000 Units by mouth daily.   CLOPIDOGREL (PLAVIX) 75 MG TABLET    Take 1 tablet (75 mg total) by mouth daily.   COLESTIPOL (COLESTID) 1 G TABLET    Take 2 tablets (2 g total) by mouth 2 (two) times daily.   ESTRADIOL (ESTRACE VAGINAL) 0.1 MG/GM VAGINAL CREAM    Place 2 g vaginally 2 (two) times a week. Thursday and Sunday   FEBUXOSTAT (ULORIC) 80 MG TABS    Take 80 mg by mouth daily.   ISOSORBIDE MONONITRATE (IMDUR) 30 MG 24 HR TABLET    TAKE 1 TABLET TWICE A DAY   LEVOTHYROXINE (SYNTHROID, LEVOTHROID) 112 MCG TABLET    TAKE 1 TABLET DAILY   METOPROLOL TARTRATE (LOPRESSOR) 25 MG TABLET    TAKE ONE-HALF (1/2) TABLET TWICE A DAY  Modified Medications   No medications on file  Discontinued Medications   No medications on file

## 2015-12-12 ENCOUNTER — Other Ambulatory Visit: Payer: Medicare Other

## 2015-12-19 ENCOUNTER — Encounter: Payer: Medicare Other | Admitting: Family Medicine

## 2015-12-24 ENCOUNTER — Other Ambulatory Visit: Payer: Self-pay | Admitting: Cardiovascular Disease

## 2016-01-08 ENCOUNTER — Ambulatory Visit (INDEPENDENT_AMBULATORY_CARE_PROVIDER_SITE_OTHER): Payer: Medicare Other

## 2016-01-08 ENCOUNTER — Ambulatory Visit (INDEPENDENT_AMBULATORY_CARE_PROVIDER_SITE_OTHER): Payer: Medicare Other | Admitting: Family Medicine

## 2016-01-08 ENCOUNTER — Encounter: Payer: Self-pay | Admitting: Family Medicine

## 2016-01-08 VITALS — BP 138/90 | HR 80 | Temp 97.7°F | Wt 177.5 lb

## 2016-01-08 DIAGNOSIS — S6991XA Unspecified injury of right wrist, hand and finger(s), initial encounter: Secondary | ICD-10-CM

## 2016-01-08 DIAGNOSIS — S6990XA Unspecified injury of unspecified wrist, hand and finger(s), initial encounter: Secondary | ICD-10-CM | POA: Insufficient documentation

## 2016-01-08 NOTE — Progress Notes (Signed)
Patient ID: [name], female   DOB: 03/18/1928, 82 y.o.   MRN: [id number]  Tommi Rumps, MD Phone: [phone]  [name] is a 82 y.o. female who presents today for same-day visit.  Patient notes earlier today she was walking along and hit her hand on a chair. Right hand dorsal aspect of the hand. Notes no pain from this though noted a little while later and area of swelling and bruising consistent with hematoma. Notes no pain, tingling, numbness, or weakness. She's been using cold compresses on the area. She is on aspirin and Plavix. Notes the swelling has become stable since this occurred    ROS see history of present illness  Objective  Physical Exam Filed Vitals:   01/08/16 1640  BP: 138/90  Pulse: 80  Temp: 97.7 F (36.5 C)    BP Readings from Last 3 Encounters:  01/08/16 138/90  11/03/15 108/82  08/11/15 148/90   Wt Readings from Last 3 Encounters:  01/08/16 177 lb 8 oz (80.513 kg)  11/03/15 175 lb 12 oz (79.72 kg)  08/11/15 176 lb 8 oz (80.06 kg)    Physical Exam  Constitutional: She is well-developed, well-nourished, and in no distress.  Cardiovascular: Normal rate, regular rhythm and normal heart sounds.   Pulmonary/Chest: Effort normal and breath sounds normal.  Musculoskeletal:  Right hand with 6-7 cm hematoma on the dorsal aspect of the hand, there is no warmth or tenderness to this, no swelling into her fingers, she has full range of motion of her fingers and hand and wrist, she has no bony tenderness on the palmar aspect of her hand, no tenderness at the snuffbox on the right, 5 out of 5 grip strength on the right, sensation to light touch intact on the right, 2+ radial pulse on the right, good capillary refill in the right Left hand with no swelling or bruising noted, full range of motion, 5 out of 5 strength of grip on the left, sensation light touch intact on the left, 2+ radial pulse on the left, good capillary refill on the left  Skin:  Skin is warm and dry.     Assessment/Plan: Please see individual problem list.  Hand injury Patient with right-sided hand injury earlier today leading to apparent hematoma formation. No tenderness. She is neurovascularly intact. We'll obtain an x-ray to ensure no fracture underlying hematoma. Discussed cold compresses. Advised to return precautions.    Orders Placed This Encounter  Procedures  . DG Hand Complete Right    Standing Status: Future     Number of Occurrences: 1     Standing Expiration Date: 03/09/2017    Order Specific Question:  Reason for Exam (SYMPTOM  OR DIAGNOSIS REQUIRED)    Answer:  right hand injury with hematoma    Order Specific Question:  Preferred imaging location?    Answer:  [facility]    Tommi Rumps, MD [place]

## 2016-01-08 NOTE — Progress Notes (Signed)
Pre visit review using our clinic review tool, if applicable. No additional management support is needed unless otherwise documented below in the visit note. 

## 2016-01-08 NOTE — Patient Instructions (Addendum)
Nice to meet you. We'll obtain an x-ray of your hand rule out fracture. You have a hematoma. You should use cold compresses on this. It should take several weeks for this to resolve fully. If you develop enlarging hematoma, inability to move her hand, decreased mobility in her hand, numbness, tingling, weakness, or cold sensation in her fingers please seek medical attention immediately.

## 2016-01-08 NOTE — Assessment & Plan Note (Signed)
Patient with right-sided hand injury earlier today leading to apparent hematoma formation. No tenderness. She is neurovascularly intact. We'll obtain an x-ray to ensure no fracture underlying hematoma. Discussed cold compresses. Advised to return precautions.

## 2016-01-19 ENCOUNTER — Encounter: Payer: Self-pay | Admitting: Family Medicine

## 2016-01-19 ENCOUNTER — Ambulatory Visit (INDEPENDENT_AMBULATORY_CARE_PROVIDER_SITE_OTHER): Payer: Medicare Other | Admitting: Family Medicine

## 2016-01-19 VITALS — BP 138/86 | HR 75 | Temp 97.8°F | Ht 66.0 in | Wt 176.2 lb

## 2016-01-19 DIAGNOSIS — S6991XA Unspecified injury of right wrist, hand and finger(s), initial encounter: Secondary | ICD-10-CM

## 2016-01-19 NOTE — Assessment & Plan Note (Signed)
Hematoma significantly improved. Patient doing very well with this. She'll continue ice packs and continue to monitor. Given return precautions.

## 2016-01-19 NOTE — Progress Notes (Signed)
Patient ID: [name], female   DOB: 1927/11/13, 82 y.o.   MRN: [id number]  Tommi Rumps, MD Phone: [phone]  [name] is a 82 y.o. female who presents today for follow-up.  Patient notes the hematoma on the dorsal aspect of her right hand has improved significantly. There is a small area that is still swollen but is minimally tender though she has no pain except for when pressing on it. Notes no numbness or tingling. No weakness in the hand. She's been using cold packs. She is able to move her hand very easily.  ROS see history of present illness  Objective  Physical Exam Filed Vitals:   01/19/16 1605  BP: 138/86  Pulse: 75  Temp: 97.8 F (36.6 C)    BP Readings from Last 3 Encounters:  01/19/16 138/86  01/08/16 138/90  11/03/15 108/82   Wt Readings from Last 3 Encounters:  01/19/16 176 lb 3.2 oz (79.924 kg)  01/08/16 177 lb 8 oz (80.513 kg)  11/03/15 175 lb 12 oz (79.72 kg)    Physical Exam  Constitutional: She is well-developed, well-nourished, and in no distress.  Cardiovascular: Normal rate, regular rhythm and normal heart sounds.   Pulmonary/Chest: Effort normal and breath sounds normal.  Musculoskeletal:  Dorsum of right hand with 1.5 -2 cm hematoma significantly improved from previously, 2+ radial pulses bilaterally, capillary refill less than 2 seconds bilateral hands  Neurological: She is alert. Gait normal.  5 out of 5 strength bilateral upper extremities in biceps, triceps, and grip, sensation light touch intact in bilateral upper extremities  Skin: Skin is warm and dry. She is not diaphoretic.     Assessment/Plan: Please see individual problem list.  Hand injury Hematoma significantly improved. Patient doing very well with this. She'll continue ice packs and continue to monitor. Given return precautions.    Tommi Rumps, MD [place]

## 2016-01-19 NOTE — Progress Notes (Signed)
Pre visit review using our clinic review tool, if applicable. No additional management support is needed unless otherwise documented below in the visit note. 

## 2016-01-19 NOTE — Patient Instructions (Signed)
Nice to see you. Your hand looks much better. Please continue to use ice packs and monitor.  If you develop numbness, weakness, pain, increased swelling, redness, fevers, or any new or change in symptoms please seek medical attention.

## 2016-02-24 ENCOUNTER — Telehealth: Payer: Self-pay

## 2016-02-24 DIAGNOSIS — E039 Hypothyroidism, unspecified: Secondary | ICD-10-CM

## 2016-02-24 MED ORDER — LEVOTHYROXINE SODIUM 112 MCG PO TABS: 112.0000 ug | ORAL_TABLET | Freq: Every day | ORAL | 2 refills | Status: DC

## 2016-02-24 NOTE — Telephone Encounter (Signed)
pt [facility] Last seen 06/2015 and last thyroid labs 12/19/2014. Please advise. No future appt scheduled.

## 2016-02-24 NOTE — Telephone Encounter (Signed)
MAke sure pt has OV schedule for [facility] when due with labs prior. Refil levothyroxine until then.

## 2016-02-25 NOTE — Telephone Encounter (Signed)
Pt called back to check on her rx/  I read the note from dr bedole to pt. Pt got up set and didn't understand why she needed to make an appointment before we could refill her meds  She stated it wasn't time for her cpx .  She stated she only had 6 pills left.  I told her we could make her appointment for a later date and they would refill her meds until then.  She stated she needed to check her calendar and call back to schedule appointment she said she only needed her meds not an appointment.  She also wanted a call back when the [facility]   Best number [phone]

## 2016-02-26 NOTE — Telephone Encounter (Signed)
She does not need an appt.. Just needs lab check. Last check was 12/2014.. We like to do it once a year. If she feels very strongly about it, she has been pretty stable.. I am okay with filling her med until her CPX.

## 2016-02-27 ENCOUNTER — Other Ambulatory Visit (INDEPENDENT_AMBULATORY_CARE_PROVIDER_SITE_OTHER): Payer: Medicare Other

## 2016-02-27 DIAGNOSIS — E039 Hypothyroidism, unspecified: Secondary | ICD-10-CM

## 2016-02-27 LAB — TSH: TSH: 0.76 u[IU]/mL (ref 0.35–4.50)

## 2016-02-27 LAB — T3, FREE: T3, Free: 2.2 pg/mL — ABNORMAL LOW (ref 2.3–4.2)

## 2016-02-27 LAB — T4, FREE: Free T4: 1.19 ng/dL (ref 0.60–1.60)

## 2016-02-27 NOTE — Telephone Encounter (Signed)
Ms. Robbs notified as instructed by telephone.  Lab appointment scheduled for today at 11:00am to check her thyroid levels.

## 2016-02-27 NOTE — Addendum Note (Signed)
Addended by: Carter Kitten on: 02/27/2016 09:54 AM   Modules accepted: Orders

## 2016-04-12 ENCOUNTER — Other Ambulatory Visit: Payer: Self-pay | Admitting: *Deleted

## 2016-04-12 ENCOUNTER — Telehealth: Payer: Self-pay | Admitting: Cardiovascular Disease

## 2016-04-12 MED ORDER — METOPROLOL TARTRATE 25 MG PO TABS: ORAL_TABLET | ORAL | 3 refills | Status: DC

## 2016-04-12 NOTE — Telephone Encounter (Signed)
°*  STAT* If patient is at the pharmacy, call can be transferred to refill team. ° ° °1. Which medications need to be refilled? (please list name of each medication and dose if known)  °Metoprolol  °2. Which pharmacy/location (including street and city if local pharmacy) is medication to be sent to? °[facility] ° °3. Do they need a 30 day or 90 day supply? 90 day  ° ° °

## 2016-04-12 NOTE — Telephone Encounter (Signed)
Left voicemail message that prescription was sent in as requested and to call back if any further needs.

## 2016-04-26 ENCOUNTER — Other Ambulatory Visit: Payer: Self-pay

## 2016-04-26 MED ORDER — METOPROLOL TARTRATE 25 MG PO TABS: ORAL_TABLET | ORAL | 3 refills | Status: DC

## 2016-05-20 ENCOUNTER — Ambulatory Visit (INDEPENDENT_AMBULATORY_CARE_PROVIDER_SITE_OTHER): Payer: Medicare Other | Admitting: Cardiovascular Disease

## 2016-05-20 ENCOUNTER — Encounter: Payer: Self-pay | Admitting: Cardiovascular Disease

## 2016-05-20 VITALS — BP 140/90 | HR 77 | Ht 67.0 in | Wt 171.0 lb

## 2016-05-20 DIAGNOSIS — I6521 Occlusion and stenosis of right carotid artery: Secondary | ICD-10-CM

## 2016-05-20 DIAGNOSIS — I714 Abdominal aortic aneurysm, without rupture, unspecified: Secondary | ICD-10-CM

## 2016-05-20 DIAGNOSIS — I350 Nonrheumatic aortic (valve) stenosis: Secondary | ICD-10-CM | POA: Diagnosis not present

## 2016-05-20 DIAGNOSIS — I2510 Atherosclerotic heart disease of native coronary artery without angina pectoris: Secondary | ICD-10-CM

## 2016-05-20 DIAGNOSIS — I739 Peripheral vascular disease, unspecified: Secondary | ICD-10-CM

## 2016-05-20 DIAGNOSIS — I10 Essential (primary) hypertension: Secondary | ICD-10-CM | POA: Diagnosis not present

## 2016-05-20 DIAGNOSIS — N182 Chronic kidney disease, stage 2 (mild): Secondary | ICD-10-CM

## 2016-05-20 DIAGNOSIS — E7800 Pure hypercholesterolemia, unspecified: Secondary | ICD-10-CM

## 2016-05-20 NOTE — Patient Instructions (Addendum)
Medication Instructions:   No medication changes  Labwork:  No new labs needed  Testing/Procedures:  No further testing at this time   Follow-Up: It was a pleasure seeing you in the office today. Please call us if you have new issues that need to be addressed before your next appt.  [phone]  Your physician wants you to follow-up in: 6 months.  You will receive a reminder letter in the mail two months in advance. If you don't receive a letter, please call our office to schedule the follow-up appointment.  If you need a refill on your cardiac medications before your next appointment, please call your pharmacy.

## 2016-05-20 NOTE — Progress Notes (Signed)
Cardiology Office Note  Date:  05/20/2016   ID:  Becky VONWALD, DOB 01-Dec-1927, MRN [id number]  PCP:  Eliezer Lofts, MD   Chief Complaint  Patient presents with  . other    6 month follow up. Meds reviewed by the pt. verbally. Pt. c/o feeling very nervous.     HPI:  [name] is an 82 year old woman with history of coronary artery disease, status post non-ST-elevation myocardial infarction in January 2007,bare-metal stenting to the obtuse marginal,  totally occluded right coronary with Collaterals residual signifcant LCX disease not easily amenable to intervention, previous abdominal aortic aneurysm repair and bilateral iliac stenting, carotid artery stenosis (critical on the right) followed by Dr. Oneida Alar, renal artery stenosis with an atrophic left kidney and a moderate blockage on the right, hypertension and hyperlipidemia,   She presents for follow-up of her coronary artery disease Echocardiogram every 2017 with moderate to severe aortic valve stenosis  In follow-up, she is very anxious Did not want to start her visit today without her daughter being present Currently Lives alone Daughter did come for some of the visit, reports she is more forgetful/memory issues Getting upset about "small stuff",  Poor sleep hygiene Active, church, groceries, hair appt  she denies any symptoms concerning for angina  Active, no significant shortness of breath carotid disease, 80-99% on the right,   descending aorta aneurysm 4.8 cm with mural thrombus  creatinine 1.4 Previous total cholesterol 192, LDL 37 ( unclear why total cholesterol increased, LDL decreased compared to prior numbers)  Prior echocardiogram in 2012 showing mild to moderate, possibly moderate aortic valve stenosis  EKG on today's visit showing normal sinus rhythm with rate 77 bpm, no significant ST or T-wave changes  Other past medical history She stopped smoking approximately 8 years ago, smoked for 20-30 years but the  exact number of years is vague In followup today, she reports that she is doing well. She suffered an accident and fractured her fifth finger, currently in a splint. No surgery needed  Tolerating her medications well, total cholesterol 134 in December 2014 She tolerated WelChol one pill twice a day.   she feels well with no complaints .   carotid ultrasound showed progression of the stenosis on the right. Estimate is 80-99%.  She has seen Dr. Oneida Alar with repeat followup and ultrasound in 2014. Plan was made to watch her closely with possible stenting with any symptoms of TIA or stroke   Aortic ultrasound showed tortuous ectatic aorta biiliac bypass graft. Maximum dimensions 3.5 x 3.3 cm, dilated left common iliac artery 2 cm Previous echocardiogram EF 55-65% Mild to moderate AS     PMH:   has a past medical history of AAA (abdominal aortic aneurysm) ([place]); Aortic stenosis; Basal cell carcinoma of nose (1980s); Bruises easily; CAD (coronary artery disease); Carotid artery stenosis; CRI (chronic renal insufficiency); Depression (2006); Full dentures; GERD (gastroesophageal reflux disease); HTN (hypertension); Hyperlipidemia; Hyperparathyroidism; Hypothyroidism; Incontinence; Myocardial infarction (2007); PAD (peripheral artery disease) ([place]); Renal artery stenosis ([place]); Ventral hernia; and Vitamin D deficiency.  PSH:    Past Surgical History:  Procedure Laterality Date  . ABDOMINAL AORTIC ANEURYSM REPAIR  1995, 07/01/2005  . ANGIOPLASTY  2005 - 07/01/2005  . APPENDECTOMY  1954  . breast biospy  1996  . BREAST SURGERY    . CHOLECYSTECTOMY  07/01/2005  . CHOLECYSTECTOMY  1987  . COLONOSCOPY  07/07/2005   Colonic erosions, ulcers proctitis  . COLONOSCOPY W/ ENDOSCOPIC [place]  07/01/2005   [place]  kidney diffuse atrophy  . CORONARY STENT PLACEMENT    . CORONARY STENT PLACEMENT  07/2005   MI   . HERNIA REPAIR  1997  . HOSP  04/2005   ? abd. prob. mesenteric ischemia  . HOSP  01/2006    Hosp for reflux (chest pain)  . ILIAC ARTERY STENT  2005 - 07/01/2005   Stent placement, both legs - PVD in arteries  . NSTEMI  08/13/2005   1/07 s/p stent in circumflex  . TONSILLECTOMY  1943  . UPPER GASTROINTESTINAL ENDOSCOPY  07/07/2005   Nml    Current Outpatient Prescriptions  Medication Sig Dispense Refill  . acetaminophen (TYLENOL) 500 MG tablet Take 1,000 mg by mouth 2 (two) times daily as needed for pain.    Marland Kitchen aspirin EC 81 MG tablet Take 81 mg by mouth at bedtime.    Marland Kitchen atorvastatin (LIPITOR) 80 MG tablet Take 1 tablet (80 mg total) by mouth daily. 90 tablet 2  . Cholecalciferol 1000 UNITS capsule Take 3,000 Units by mouth daily.    . clopidogrel (PLAVIX) 75 MG tablet Take 1 tablet (75 mg total) by mouth daily. 90 tablet 3  . colestipol (COLESTID) 1 G tablet Take 2 tablets (2 g total) by mouth 2 (two) times daily. 360 tablet 3  . estradiol (ESTRACE VAGINAL) 0.1 MG/GM vaginal cream Place 2 g vaginally 2 (two) times a week. Thursday and Sunday    . Febuxostat (ULORIC) 80 MG TABS Take 80 mg by mouth daily.    . isosorbide mononitrate (IMDUR) 30 MG 24 hr tablet Take 1 tablet (30 mg total) by mouth 2 (two) times daily. 180 tablet 1  . levothyroxine (SYNTHROID, LEVOTHROID) 112 MCG tablet Take 1 tablet (112 mcg total) by mouth daily. 90 tablet 2  . metoprolol tartrate (LOPRESSOR) 25 MG tablet TAKE ONE-HALF (1/2) TABLET TWICE A DAY 90 tablet 3   No current facility-administered medications for this visit.      Allergies:   Fish oil; Naproxen; Tramadol; Codeine; Morphine sulfate; and Sulfonamide derivatives   Social History:  The patient  reports that she quit smoking about 10 years ago. She has never used smokeless tobacco. She reports that she does not drink alcohol or use drugs.   Family History:   family history includes Breast cancer in her sister; Cancer in her father, mother, and sister; Heart attack in her father and mother; Heart disease in her father and mother;  Hyperlipidemia in her sister, sister, and son; Hypertension in her sister; Stroke in her father and mother; Throat cancer in her father.    Review of Systems: Review of Systems  Constitutional: Negative.   Respiratory: Negative.   Cardiovascular: Negative.   Gastrointestinal: Negative.   Musculoskeletal: Negative.   Neurological: Negative.   Psychiatric/Behavioral: The patient is nervous/anxious.   All other systems reviewed and are negative.    PHYSICAL EXAM: VS:  BP 140/90 (BP Location: Left Arm, Patient Position: Sitting, Cuff Size: Normal)   Pulse 77   Ht 5\' 7"  (1.702 m)   Wt 171 lb (77.6 kg)   BMI 26.78 kg/m  , BMI Body mass index is 26.78 kg/m. GEN: Well nourished, well developed, in no acute distress , anxious HEENT: normal  Neck: no JVD, carotid bruits, or masses Cardiac: RRR; 2+ SEM RSB,  No  rubs, or gallops,no edema  Respiratory:  clear to auscultation bilaterally, normal work of breathing GI: soft, nontender, nondistended, + BS MS: no deformity or atrophy  Skin: warm and dry,  no rash Neuro:  Strength and sensation are intact Psych: euthymic mood, full affect    Recent Labs: 06/10/2015: ALT 14; BUN 31; Creatinine, Ser 1.46; Potassium 4.7; Sodium 142 02/27/2016: TSH 0.76    Lipid Panel Lab Results  Component Value Date   CHOL 192 06/10/2015   HDL 38.70 (L) 06/10/2015   LDLCALC 86 11/20/2014   TRIG 251.0 (H) 06/10/2015      Wt Readings from Last 3 Encounters:  05/20/16 171 lb (77.6 kg)  01/19/16 176 lb 3.2 oz (79.9 kg)  01/08/16 177 lb 8 oz (80.5 kg)       ASSESSMENT AND PLAN:  Essential hypertension - Plan: EKG 12-Lead Blood pressure borderline elevated. She is anxious today Recommended she monitor home blood pressure measurements before we make any changes  Atherosclerosis of native coronary artery without angina pectoris, unspecified whether native or transplanted heart - Plan: EKG 12-Lead Currently with no symptoms of angina. No further  workup at this time. Continue current medication regimen.  Stenosis of right carotid artery She has close follow-up with Dr. Oneida Alar Last carotid ultrasounds showed stable disease though severe Would likely be challenging candidate for CEA given moderate to severe aortic valve stenosis And her age  Aortic valve stenosis, etiology of cardiac valve disease unspecified Moderate to severe aortic valve stenosis Mild to moderate regurg Ascending aorta mildly dilated Medical management at this time Repeat echocardiogram in 2018  Hyperlipidemia recommended she follow-up with primary care, will need routine lab work Fasting lipid panel   Abdominal aortic aneurysm (AAA) without rupture ([place])  followed by Dr. Oneida Alar in [place]   PAD (peripheral artery disease) ([place])  Chronic kidney disease (CKD), stage II (mild)   anxiety   anxious on today's visit, unclear if she would benefit from medication Lives alone, nervous to live alone per the patient Daughter with [place] in the office today reports forgetfulness, memory issues, worrying about small things We have made appointment for routine follow-up with primary care for annual visit at patient's request   Total encounter time more than 25 minutes  Greater than 50% was spent in counseling and coordination of care with the patient   Disposition:   F/U  6 months   Orders Placed This Encounter  Procedures  . EKG 12-Lead     Signed, Esmond Plants, M.D., Ph.D. 05/20/2016  [place], [place]

## 2016-05-25 ENCOUNTER — Ambulatory Visit (INDEPENDENT_AMBULATORY_CARE_PROVIDER_SITE_OTHER): Payer: Medicare Other | Admitting: Family Medicine

## 2016-05-25 ENCOUNTER — Encounter: Payer: Self-pay | Admitting: Family Medicine

## 2016-05-25 DIAGNOSIS — F331 Major depressive disorder, recurrent, moderate: Secondary | ICD-10-CM | POA: Diagnosis not present

## 2016-05-25 MED ORDER — SERTRALINE HCL 25 MG PO TABS: 25.0000 mg | ORAL_TABLET | Freq: Every day | ORAL | 5 refills | Status: DC

## 2016-05-25 NOTE — Patient Instructions (Addendum)
Start low dose sertraline 25 mg at bedtime.  Call if interested in counseling.

## 2016-05-25 NOTE — Progress Notes (Signed)
Subjective:    Patient ID: [name], female    DOB: Jun 19, 1928, 82 y.o.   MRN: [id number]  HPI   82 year old female presents with her daughter with new concerns in the last year regarding anxiety , mood fluctuations and depression.  See scanned in document from daughter summarizing complaints.  She is anxious and cannot relax with family. She is afraid at night of being alone. She gets panicky and SOB. She easily angered. Short tempered and rude to a nurse at a doctor's office.  Sometimes confused about thermostat and scared about something going wrong.   The patient has noted she has been worrying more, does not feel safe by herself. Trouble relaxing. Daughter has noted these symptoms in last year, progressing.  She has been more sad, does not like being alone.  She has more trouble since sister passed away 2 years ago.   She has also been feeling tired . She sleeps at night.  Has some anhedmonia   She has a history of depression and in past was on a medication.   She has been seen by Dr, Rockey Situ recently with excellent report for heart health. Afib stable.  TSH, free t3 and free t4 nml range last check 8/18.   PHQ9: 34 today   Per daughter and pt her memory is good.   Review of Systems  Constitutional: Positive for fatigue. Negative for fever.  HENT: Negative for ear pain.   Eyes: Negative for pain.  Respiratory: Negative for chest tightness and shortness of breath.   Cardiovascular: Negative for chest pain, palpitations and leg swelling.  Gastrointestinal: Negative for abdominal pain.  Genitourinary: Negative for dysuria.       Objective:   Physical Exam  Constitutional: Vital signs are normal. She appears well-developed and well-nourished. She is cooperative.  Non-toxic appearance. She does not appear ill. No distress.  HENT:  Head: Normocephalic.  Right Ear: Hearing, tympanic membrane, external ear and ear canal normal. Tympanic membrane is not  erythematous, not retracted and not bulging.  Left Ear: Hearing, tympanic membrane, external ear and ear canal normal. Tympanic membrane is not erythematous, not retracted and not bulging.  Nose: No mucosal edema or rhinorrhea. Right sinus exhibits no maxillary sinus tenderness and no frontal sinus tenderness. Left sinus exhibits no maxillary sinus tenderness and no frontal sinus tenderness.  Mouth/Throat: Uvula is midline, oropharynx is clear and moist and mucous membranes are normal.  Eyes: Conjunctivae, EOM and lids are normal. Pupils are equal, round, and reactive to light. Lids are everted and swept, no foreign bodies found.  Neck: Trachea normal and normal range of motion. Neck supple. Carotid bruit is not present. No thyroid mass and no thyromegaly present.  Cardiovascular: Normal rate, regular rhythm, S1 normal, S2 normal, normal heart sounds, intact distal pulses and normal pulses.  Exam reveals no gallop and no friction rub.   No murmur heard. Pulmonary/Chest: Effort normal and breath sounds normal. No tachypnea. No respiratory distress. She has no decreased breath sounds. She has no wheezes. She has no rhonchi. She has no rales.  Abdominal: Soft. Normal appearance and bowel sounds are normal. There is no tenderness.  Neurological: She is alert.  Skin: Skin is warm, dry and intact. No rash noted.  Psychiatric: Judgment and thought content normal. Her mood appears anxious. Her speech is rapid and/or pressured. She is agitated. Cognition and memory are normal. She does not exhibit a depressed mood. She expresses no homicidal and no suicidal  ideation. She expresses no suicidal plans and no homicidal plans.          Assessment & Plan:

## 2016-05-25 NOTE — Assessment & Plan Note (Addendum)
Start low dose sertraline.  Close follow up in 4 weeks.  offered counseling.. Will consider.  no clear MS changes memory issues.. Will eval at upcoming  [facility].

## 2016-05-25 NOTE — Progress Notes (Signed)
Pre visit review using our clinic review tool, if applicable. No additional management support is needed unless otherwise documented below in the visit note. 

## 2016-06-18 ENCOUNTER — Other Ambulatory Visit: Payer: Self-pay | Admitting: Cardiovascular Disease

## 2016-06-21 ENCOUNTER — Other Ambulatory Visit (INDEPENDENT_AMBULATORY_CARE_PROVIDER_SITE_OTHER): Payer: Medicare Other

## 2016-06-21 ENCOUNTER — Telehealth: Payer: Self-pay | Admitting: Family Medicine

## 2016-06-21 DIAGNOSIS — E7800 Pure hypercholesterolemia, unspecified: Secondary | ICD-10-CM

## 2016-06-21 DIAGNOSIS — M1A9XX0 Chronic gout, unspecified, without tophus (tophi): Secondary | ICD-10-CM

## 2016-06-21 DIAGNOSIS — E559 Vitamin D deficiency, unspecified: Secondary | ICD-10-CM | POA: Diagnosis not present

## 2016-06-21 DIAGNOSIS — R7989 Other specified abnormal findings of blood chemistry: Secondary | ICD-10-CM

## 2016-06-21 DIAGNOSIS — K219 Gastro-esophageal reflux disease without esophagitis: Secondary | ICD-10-CM

## 2016-06-21 LAB — URIC ACID: Uric Acid, Serum: 3 mg/dL (ref 2.4–7.0)

## 2016-06-21 LAB — COMPREHENSIVE METABOLIC PANEL
ALBUMIN: 3.7 g/dL (ref 3.5–5.2)
ALT: 9 U/L (ref 0–35)
AST: 12 U/L (ref 0–37)
Alkaline Phosphatase: 53 U/L (ref 39–117)
BILIRUBIN TOTAL: 0.6 mg/dL (ref 0.2–1.2)
BUN: 23 mg/dL (ref 6–23)
CALCIUM: 9.6 mg/dL (ref 8.4–10.5)
CHLORIDE: 105 meq/L (ref 96–112)
CO2: 27 mEq/L (ref 19–32)
CREATININE: 1.37 mg/dL — AB (ref 0.40–1.20)
GFR: 38.79 mL/min — AB (ref >60.00)
Glucose, Bld: 101 mg/dL — ABNORMAL HIGH (ref 70–99)
Potassium: 4.3 mEq/L (ref 3.5–5.1)
Sodium: 142 mEq/L (ref 135–145)
Total Protein: 6.7 g/dL (ref 6.0–8.3)

## 2016-06-21 LAB — LIPID PANEL
CHOLESTEROL: 151 mg/dL (ref 0–200)
HDL: 35.8 mg/dL — AB (ref >39.00)
NonHDL: 114.92
TRIGLYCERIDES: 234 mg/dL — AB (ref 0.0–149.0)
Total CHOL/HDL Ratio: 4
VLDL: 46.8 mg/dL — ABNORMAL HIGH (ref 0.0–40.0)

## 2016-06-21 LAB — LDL CHOLESTEROL, DIRECT: LDL DIRECT: 29 mg/dL

## 2016-06-21 LAB — VITAMIN D 25 HYDROXY (VIT D DEFICIENCY, FRACTURES): VITD: 64.4 ng/mL (ref 30.00–100.00)

## 2016-06-21 NOTE — Telephone Encounter (Signed)
-----   Message from Marchia Bond sent at 06/14/2016  2:59 PM EST ----- Regarding: Cpx labs mon 12/11, need orders. Thanks :-) Please order  future cpx labs for pt's upcoming lab appt. Thanks Aniceto Boss

## 2016-06-24 ENCOUNTER — Encounter: Payer: Self-pay | Admitting: Family Medicine

## 2016-06-24 ENCOUNTER — Ambulatory Visit (INDEPENDENT_AMBULATORY_CARE_PROVIDER_SITE_OTHER): Payer: Medicare Other | Admitting: Family Medicine

## 2016-06-24 VITALS — BP 126/79 | HR 70 | Temp 97.6°F | Ht 65.5 in | Wt 167.8 lb

## 2016-06-24 DIAGNOSIS — E559 Vitamin D deficiency, unspecified: Secondary | ICD-10-CM

## 2016-06-24 DIAGNOSIS — N183 Chronic kidney disease, stage 3 unspecified: Secondary | ICD-10-CM

## 2016-06-24 DIAGNOSIS — I712 Thoracic aortic aneurysm, without rupture: Secondary | ICD-10-CM

## 2016-06-24 DIAGNOSIS — E039 Hypothyroidism, unspecified: Secondary | ICD-10-CM

## 2016-06-24 DIAGNOSIS — I714 Abdominal aortic aneurysm, without rupture, unspecified: Secondary | ICD-10-CM

## 2016-06-24 DIAGNOSIS — M1A9XX0 Chronic gout, unspecified, without tophus (tophi): Secondary | ICD-10-CM

## 2016-06-24 DIAGNOSIS — I739 Peripheral vascular disease, unspecified: Secondary | ICD-10-CM

## 2016-06-24 DIAGNOSIS — E7800 Pure hypercholesterolemia, unspecified: Secondary | ICD-10-CM | POA: Diagnosis not present

## 2016-06-24 DIAGNOSIS — Z0000 Encounter for general adult medical examination without abnormal findings: Secondary | ICD-10-CM | POA: Diagnosis not present

## 2016-06-24 DIAGNOSIS — F331 Major depressive disorder, recurrent, moderate: Secondary | ICD-10-CM

## 2016-06-24 DIAGNOSIS — N898 Other specified noninflammatory disorders of vagina: Secondary | ICD-10-CM

## 2016-06-24 DIAGNOSIS — I7123 Aneurysm of the descending thoracic aorta, without rupture: Secondary | ICD-10-CM

## 2016-06-24 MED ORDER — SERTRALINE HCL 50 MG PO TABS: 50.0000 mg | ORAL_TABLET | Freq: Every day | ORAL | 3 refills | Status: DC

## 2016-06-24 NOTE — Assessment & Plan Note (Signed)
Inadequate control. Increase sertraline to 50 mg daily.

## 2016-06-24 NOTE — Assessment & Plan Note (Signed)
Stable control on uloric.

## 2016-06-24 NOTE — Progress Notes (Signed)
Subjective:    Patient ID: [name], female    DOB: 06-28-1928, 82 y.o.   MRN: [id number]  HPI   82 year old female presents for AM as well as for 4 week follow up depression.   I have personally reviewed the Medicare Annual Wellness questionnaire and have noted 1. The patient's medical and social history 2. Their use of alcohol, tobacco or illicit drugs 3. Their current medications and supplements 4. The patient's functional ability including ADL's, fall risks, home safety risks and hearing or visual             impairment. 5. Diet and physical activities 6. Evidence for depression or mood disorders 7.         Updated provider list Cognitive evaluation was performed and recorded on pt medicare questionnaire form. The patients weight, height, BMI and visual acuity have been recorded in the chart  I have made referrals, counseling and provided education to the patient based review of the above and I have provided the pt with a written personalized care plan for preventive services.   Major depressive disorder: Now on low dose sertraline for 4 weeks. She reports she is feeling minimmally better overall. She does feeli closed up with the cold weather. No SE to the med.  Elevated Cholesterol: high risk CAD.. On high dose atorvastatin. Lab Results  Component Value Date   CHOL 151 06/21/2016   HDL 35.80 (L) 06/21/2016   LDLCALC 86 11/20/2014   LDLDIRECT 29.0 06/21/2016   TRIG 234.0 (H) 06/21/2016   CHOLHDL 4 06/21/2016  Using medications without problems: Muscle aches:  Diet compliance: good Exercise: walking Other complaints:  Hypertension:   Well controlled on metoprolol, isosorbide. BP Readings from Last 3 Encounters:  06/24/16 126/79  05/25/16 113/67  05/20/16 140/90   Using medication without problems or lightheadedness:  Chest pain with exertion: none Edema:none Short of breath: none Average home BPs: good Other issues:  Hypothyroid:  Stable last check  on levothyroxine 165mcg Lab Results  Component Value Date   TSH 0.76 02/27/2016    Gout, no recent flares on uloric:  Lab Results  Component Value Date   LABURIC 3.0 06/21/2016   CKD Stage 3: stable. Followed by Dr. Zara Chess  CAD followed by cardiology  AAA, PAD, carotid stenosis and  TAA followed by vascular (Dr. Eden Lathe) closely.  Social History /Family History/Past Medical History reviewed and updated if needed. Patient Care Team: Jinny Sanders, MD as PCP - General (Family Medicine) Minna Merritts, MD (Cardiology) Jolaine Artist, MD (Cardiology) Review of Systems  Constitutional: Negative for fatigue and fever.  HENT: Negative for congestion.   Eyes: Negative for pain.  Respiratory: Negative for cough and shortness of breath.   Cardiovascular: Negative for chest pain, palpitations and leg swelling.  Gastrointestinal: Negative for abdominal pain.  Genitourinary: Positive for vaginal bleeding and vaginal discharge. Negative for dysuria.        Occ wearing panty liner Has noted since ifrst having pessary placed  Several years ago. First discharge occurred when it needed to be changed, but now even if changing every 4 months.   in last 6 months, Has been having copious discharge even if changing more recently, heavier since last changed by uro in 04/2016 .Marland Kitchen Some brownish to yellowish and occ bloody. No abd pain.  Still has uterus and ovaries.   Musculoskeletal: Positive for back pain.  Neurological: Negative for syncope, light-headedness and headaches.  Psychiatric/Behavioral: Negative for dysphoric  mood.       Objective:   Physical Exam  Constitutional: Vital signs are normal. She appears well-developed and well-nourished. She is cooperative.  Non-toxic appearance. She does not appear ill. No distress.  HENT:  Head: Normocephalic.  Right Ear: Hearing, tympanic membrane, external ear and ear canal normal.  Left Ear: Hearing, tympanic membrane, external ear and ear  canal normal.  Nose: Nose normal.  Eyes: Conjunctivae, EOM and lids are normal. Pupils are equal, round, and reactive to light. Lids are everted and swept, no foreign bodies found.  Neck: Trachea normal and normal range of motion. Neck supple. Carotid bruit is not present. No thyroid mass and no thyromegaly present.  Cardiovascular: Normal rate, regular rhythm, S1 normal, S2 normal, normal heart sounds and intact distal pulses.  Exam reveals no gallop.   No murmur heard. Pulmonary/Chest: Effort normal and breath sounds normal. No respiratory distress. She has no wheezes. She has no rhonchi. She has no rales.  Abdominal: Soft. Normal appearance and bowel sounds are normal. She exhibits no distension, no fluid wave, no abdominal bruit and no mass. There is no hepatosplenomegaly. There is no tenderness. There is no rebound, no guarding and no CVA tenderness. No hernia.  Lymphadenopathy:    She has no cervical adenopathy.    She has no axillary adenopathy.  Neurological: She is alert. She has normal strength. No cranial nerve deficit or sensory deficit.  Skin: Skin is warm, dry and intact. No rash noted.  Psychiatric: Her speech is normal and behavior is normal. Judgment normal. Her mood appears not anxious. Cognition and memory are normal. She does not exhibit a depressed mood.    Pt will wait on vaginal exam until discusses with URO.      Assessment & Plan:  The patient's preventative maintenance and recommended screening tests for an annual wellness exam were reviewed in full today. Brought up to date unless services declined.  Counselled on the importance of diet, exercise, and its role in overall health and mortality. The patient's FH and SH was reviewed, including their home life, tobacco status, and drug and alcohol status.   Vaccines: uptodate No indication for mammogram.  No indication for DVE/pap smear.  Colonoscopy: last in 2009.. No bowel issues.. Does not wish to repeat.   DXA: last 2010.Marland Kitchen Showed osteopenia in hip and spine... Refuses treatement so does not want to eval.  Taking CA, vit D per nephrologist.

## 2016-06-24 NOTE — Patient Instructions (Addendum)
Increase sertraline to 50 mg. (can use up sertraline 2  X 25 mg daily) Let urologist know about discharge and level of concern about it.. Do we need new pessary placed instead cleaned.

## 2016-06-24 NOTE — Assessment & Plan Note (Signed)
?   Secondary to pessary.. Discuss with Urologist. Consider further workup if her feels pessary not causing continuous sometimes blood discharge.

## 2016-06-24 NOTE — Assessment & Plan Note (Signed)
Followed by vascular on plavic and ASA.

## 2016-06-24 NOTE — Assessment & Plan Note (Signed)
Good control on vit D supplement.

## 2016-06-24 NOTE — Assessment & Plan Note (Signed)
Followed by vascular 

## 2016-06-24 NOTE — Progress Notes (Signed)
Pre visit review using our clinic review tool, if applicable. No additional management support is needed unless otherwise documented below in the visit note. 

## 2016-06-24 NOTE — Assessment & Plan Note (Signed)
Stable

## 2016-06-24 NOTE — Assessment & Plan Note (Signed)
Well controlled. Continue current medication. On high dose statin for CAD prevention.

## 2016-06-24 NOTE — Assessment & Plan Note (Signed)
Well controlled. Continue current medication.  

## 2016-07-14 ENCOUNTER — Telehealth: Payer: Self-pay | Admitting: Family Medicine

## 2016-07-14 NOTE — Telephone Encounter (Signed)
Pt called to r/s her follow up appointment to feb.  She wanted to let you know that the new meds are doing good.

## 2016-07-19 ENCOUNTER — Other Ambulatory Visit: Payer: Self-pay

## 2016-07-19 MED ORDER — COLESTIPOL HCL 1 G PO TABS: 2.0000 g | ORAL_TABLET | Freq: Two times a day (BID) | ORAL | 3 refills | Status: DC

## 2016-07-19 MED ORDER — ATORVASTATIN CALCIUM 80 MG PO TABS: 80.0000 mg | ORAL_TABLET | Freq: Every day | ORAL | 3 refills | Status: DC

## 2016-07-19 NOTE — Telephone Encounter (Signed)
Pt request refill atorvastatin, colestid [facility]ix [facility].  Pt last annual 06/24/16. Refilled atorvastatin and colestid per protocol. In 06/24/16 note said Dr Oneida Alar vascular would follow plavix. Pt said she sees Dr Oneida Alar but he has never prescribed meds. Please advise.

## 2016-07-20 ENCOUNTER — Ambulatory Visit: Payer: Medicare Other | Admitting: Family Medicine

## 2016-07-20 ENCOUNTER — Telehealth: Payer: Self-pay | Admitting: Family Medicine

## 2016-07-20 MED ORDER — CLOPIDOGREL BISULFATE 75 MG PO TABS: 75.0000 mg | ORAL_TABLET | Freq: Every day | ORAL | 3 refills | Status: DC

## 2016-07-20 NOTE — Telephone Encounter (Signed)
Opened in error

## 2016-07-29 ENCOUNTER — Other Ambulatory Visit: Payer: Medicare Other

## 2016-07-29 ENCOUNTER — Encounter: Payer: Self-pay | Admitting: Vascular Surgery

## 2016-08-02 ENCOUNTER — Ambulatory Visit
Admission: RE | Admit: 2016-08-02 | Discharge: 2016-08-02 | Disposition: A | Discharge status: Home / Self Care | Payer: Medicare Other | Source: Ambulatory Visit | Attending: Vascular Surgery | Admitting: Vascular Surgery

## 2016-08-02 DIAGNOSIS — I712 Thoracic aortic aneurysm, without rupture, unspecified: Secondary | ICD-10-CM

## 2016-08-05 ENCOUNTER — Encounter: Payer: Self-pay | Admitting: Vascular Surgery

## 2016-08-05 ENCOUNTER — Ambulatory Visit (INDEPENDENT_AMBULATORY_CARE_PROVIDER_SITE_OTHER): Payer: Medicare Other | Admitting: Vascular Surgery

## 2016-08-05 ENCOUNTER — Ambulatory Visit (HOSPITAL_COMMUNITY)
Admission: RE | Admit: 2016-08-05 | Discharge: 2016-08-05 | Disposition: A | Discharge status: Home / Self Care | Payer: Medicare Other | Source: Ambulatory Visit | Attending: Vascular Surgery | Admitting: Vascular Surgery

## 2016-08-05 VITALS — BP 154/87 | HR 68 | Resp 65 | Ht 65.5 in | Wt 172.0 lb

## 2016-08-05 DIAGNOSIS — I6523 Occlusion and stenosis of bilateral carotid arteries: Secondary | ICD-10-CM

## 2016-08-05 DIAGNOSIS — I712 Thoracic aortic aneurysm, without rupture, unspecified: Secondary | ICD-10-CM

## 2016-08-05 DIAGNOSIS — I6521 Occlusion and stenosis of right carotid artery: Secondary | ICD-10-CM | POA: Diagnosis not present

## 2016-08-05 LAB — VAS US CAROTID
LCCADSYS: -44 cm/s
LCCAPDIAS: 16 cm/s
LCCAPSYS: 54 cm/s
LEFT ECA DIAS: -30 cm/s
LEFT VERTEBRAL DIAS: -15 cm/s
LICADSYS: -86 cm/s
Left CCA dist dias: -17 cm/s
Left ICA dist dias: -32 cm/s
Left ICA prox dias: 31 cm/s
Left ICA prox sys: 98 cm/s
RCCADSYS: -62 cm/s
RCCAPDIAS: 9 cm/s
RCCAPSYS: 22 cm/s
RIGHT CCA MID DIAS: 7 cm/s
RIGHT ECA DIAS: -14 cm/s
RIGHT VERTEBRAL DIAS: -9 cm/s

## 2016-08-05 NOTE — Progress Notes (Signed)
      Established Carotid Patient   History of Present Illness   EVI POLLOK is a 83 y.o. (Jan 31, 1928) female who presents for follow-up for a descending thoracic aortic aneurysm on CT scan as well as a known high-grade right internal carotid artery stenosis. .  She denies any chest pain.   She has known high-grade stenosis of the right internal carotid artery with high bifurcation. She was not considered a good carotid stenting due to her renal insufficiency and risky carotid endarterectomy candidate due to her severe coronary disease.    Previous carotid studies demonstrated: RICA 123456 stenosis,  LICA [id number] stenosis.  Patient has no recent history of TIA or stroke symptom.  The patient denies amaurosis fugax or monocular blindness.  The patient has not had facial drooping or hemiplegia.  The patient has not had receptive or expressive aphasia.  She is on aspirin, plavix and a statin .  She had recent uneventful cataract surgery.       Physical Examination    Vitals:   08/05/16 1357 08/05/16 1358  BP: (!) 156/89 (!) 154/87  Pulse: 68   Resp: (!) 65   SpO2: 90%   Weight: 172 lb (78 kg)   Height: 5' 5.5" (1.664 m)      General: A&O x 3, WD elderly female in NAD   Pulmonary: Sym exp, good air movt, CTAB, no rales, rhonchi, & wheezing   Cardiac: RRR, Nl S1, S2, 3/6 systolic murmur, soft right carotid bruit, no left carotid bruit   Vascular: 2+ radial pulses bilaterally   Neurologic: CN 2-12 grossly intact   Non-Invasive Vascular Imaging Carotid duplex shows no change from July 2016. Velocities in the right internal carotid artery 537/189 greater than 80% less than 40% left   CT scan of the chest without contrast was reviewed which showed descending thoracic aneurysm 4.8 cm in size, no change. No change in nodule previously seen since 2013. Medical Decision Making   DOREATHA COLBY has known high grade 80-99% right internal carotid artery stenosis with high bifurcation  and severe coronary artery disease. She is not a carotid stent candidate due to her renal insufficiency and risky candidate for carotid endarterectomy due to her coronary disease. She describes no recent TIA or stroke symptoms since her last office visit. She will continue on maximal medical management with aspirin, plavix, statin and strict control of blood pressure. She will follow-up in one year with repeat carotid duplex exam. However, if she develops symptoms of TIA amaurosis or stroke we would need to consider whether or not carotid endarterectomy or carotid stenting would be in her best interest.   Patient has a known 4.8 cm descending thoracic aortic aneurysm. She most likely would not be a candidate for repair of this due to her underlying renal insufficiency and age. However she probably needs a repeat CT scan of the chest without contrast in 12 months; if this continues to grow over time we would again consider whether or not it warrants repair .       [name], MD [facility] Office: [phone] Pager: [phone]

## 2016-08-10 NOTE — Addendum Note (Signed)
Addended by: Lianne Cure A on: 08/10/2016 02:39 PM   Modules accepted: Orders

## 2016-08-17 ENCOUNTER — Encounter: Payer: Self-pay | Admitting: Family Medicine

## 2016-08-17 ENCOUNTER — Ambulatory Visit (INDEPENDENT_AMBULATORY_CARE_PROVIDER_SITE_OTHER): Payer: Medicare Other | Admitting: Family Medicine

## 2016-08-17 DIAGNOSIS — F331 Major depressive disorder, recurrent, moderate: Secondary | ICD-10-CM | POA: Diagnosis not present

## 2016-08-17 NOTE — Progress Notes (Signed)
Pre visit review using our clinic review tool, if applicable. No additional management support is needed unless otherwise documented below in the visit note. 

## 2016-08-17 NOTE — Progress Notes (Signed)
   Subjective:    Patient ID: [name], female    DOB: [birth year], 83 y.o.   MRN: [id number]  HPI  83 year old female presents for follow up mood.  At last OV on 07/04/2016... Noted no improvement of major depressive disorder on low dose sertraline.  At that [place]: 8  Today she reports  Her mood is much improved. No SE to  50 mg daily.  Daughter has noted a big improvement per pt.  She is less worried about small things.  PHQ9:1  She is sleeping well, feeling more positive.  Staying less anxious. Less lonely. No anhedonia.  Trying to get more active.. There is a lot she wants to do when it gets warmer.  She has better appetite. Wt Readings from Last 3 Encounters:  08/17/16 173 lb 4 oz (78.6 kg)  08/05/16 172 lb (78 kg)  06/24/16 167 lb 12 oz (76.1 kg)     Vaginal discharge better with removal of pessary.  Review of Systems  Constitutional: Negative for fatigue and fever.  HENT: Negative for ear pain.   Eyes: Negative for pain.  Respiratory: Negative for chest tightness and shortness of breath.   Cardiovascular: Negative for chest pain, palpitations and leg swelling.  Gastrointestinal: Negative for abdominal pain.  Genitourinary: Negative for dysuria.       Objective:   Physical Exam  Constitutional: Vital signs are normal. She appears well-developed and well-nourished. She is cooperative.  Non-toxic appearance. She does not appear ill. No distress.  HENT:  Head: Normocephalic.  Right Ear: Hearing, tympanic membrane, external ear and ear canal normal. Tympanic membrane is not erythematous, not retracted and not bulging.  Left Ear: Hearing, tympanic membrane, external ear and ear canal normal. Tympanic membrane is not erythematous, not retracted and not bulging.  Nose: No mucosal edema or rhinorrhea. Right sinus exhibits no maxillary sinus tenderness and no frontal sinus tenderness. Left sinus exhibits no maxillary sinus tenderness and no frontal sinus  tenderness.  Mouth/Throat: Uvula is midline, oropharynx is clear and moist and mucous membranes are normal.  Eyes: Conjunctivae, EOM and lids are normal. Pupils are equal, round, and reactive to light. Lids are everted and swept, no foreign bodies found.  Neck: Trachea normal and normal range of motion. Neck supple. Carotid bruit is not present. No thyroid mass and no thyromegaly present.  Cardiovascular: Normal rate, regular rhythm, S1 normal, S2 normal, intact distal pulses and normal pulses.  Exam reveals no gallop and no friction rub.   Murmur heard.  Systolic murmur is present with a grade of 3/6  Pulmonary/Chest: Effort normal and breath sounds normal. No tachypnea. No respiratory distress. She has no decreased breath sounds. She has no wheezes. She has no rhonchi. She has no rales.  Abdominal: Soft. Normal appearance and bowel sounds are normal. There is no tenderness.  Neurological: She is alert.  Skin: Skin is warm, dry and intact. No rash noted.  Psychiatric: Judgment and thought content normal. Her mood appears not anxious. Her speech is not rapid and/or pressured. She is not agitated and not withdrawn. Cognition and memory are normal. She does not exhibit a depressed mood. She expresses no homicidal and no suicidal ideation. She expresses no suicidal plans and no homicidal plans.   Smiling and happy at appt.           Assessment & Plan:

## 2016-08-17 NOTE — Assessment & Plan Note (Signed)
Significant improvement with increased dose of sertraline as well as with finally having pessary out and vaginal discharge stopped.

## 2016-10-21 ENCOUNTER — Other Ambulatory Visit: Payer: Self-pay | Admitting: Family Medicine

## 2016-11-02 ENCOUNTER — Other Ambulatory Visit: Payer: Self-pay | Admitting: Family Medicine

## 2016-11-09 ENCOUNTER — Other Ambulatory Visit: Payer: Self-pay | Admitting: Family Medicine

## 2016-12-23 ENCOUNTER — Telehealth: Payer: Self-pay

## 2016-12-23 NOTE — Telephone Encounter (Signed)
Pt left v/m requesting how to wein off sertraline. Pt has 2 weeks of med left. Pt thinks she is doing much better and wants to stop sertraline. Pt having sweats at nighttime and wonders if stopped the sertraline would that help the night sweats. Pt request cb.pt has f/u appt 02/15/17.

## 2016-12-23 NOTE — Telephone Encounter (Signed)
Have her cut tablet in half.. Take daily x 1 week then every other day then stop ( or can even take every 2 days). If feels ill on any dose slow down wean.

## 2016-12-23 NOTE — Telephone Encounter (Signed)
Ms. Tieszen notified as instructed by telephone.

## 2017-01-12 NOTE — Progress Notes (Signed)
Cardiology Office Note  Date:  01/13/2017   ID:  [name], DOB 10-Nov-1927, MRN [id number]  PCP:  Jinny Sanders, MD   Chief Complaint  Patient presents with  . other    6 month follow up. "doing well." Meds reviewed by the pt. verbally.     HPI:  [name] is an 83 year old woman with history of  Moderate to severe aortic valve stenosis On echocardiogram from her 2017 coronary artery disease,  status post non-ST-elevation myocardial infarction in January 2007, bare-metal stenting to the obtuse marginal,  totally occluded right coronary with Collaterals residual signifcant LCX disease not easily amenable to intervention, abdominal aortic aneurysm repair and bilateral iliac stenting,  descending aorta aneurysm 4.8 cm with mural thrombus carotid artery stenosis (critical on the right) followed by Dr. Oneida Alar, 80-99%  renal artery stenosis with an atrophic left kidney and a moderate blockage on the right, hypertension  hyperlipidemia,   Anxiety more forgetful/memory issues She presents for follow-up of her coronary artery disease, aortic valve stenosis and PAD  In follow-up today she reports that she is doing well, denies any significant symptoms of shortness of breath or chest discomfort Active at baseline, no regular exercise program she denies any symptoms concerning for angina   Family is concerned about recent recommendation by rheumatology to stop uloric. She reports that she has been on this medication for 7 years and has had no problems Package insert morning of potential cardiac complications for patients on this medication She is indicated she would like to stay on the medication as she has been stable,  no gout symptoms  Followed by Dr. Oneida Alar in [place], last seen at the end of 2017  Lab work reviewed with her Total cholesterol 150, LDL less than 70 She is tolerating Lipitor 80 mg daily  EKG on today's visit showing normal sinus rhythm with rate 62 bpm, no  significant ST or T-wave changes  Other past medical history She stopped smoking approximately 10 years ago, smoked for 20-30 years but the exact number of years is vague   carotid ultrasound showed progression of the stenosis on the right. Estimate is 80-99%.  She has seen Dr. Oneida Alar with repeat followup and ultrasound in 2014. Plan was made to watch her closely with possible stenting with any symptoms of TIA or stroke   Aortic ultrasound showed tortuous ectatic aorta biiliac bypass graft. Maximum dimensions 3.5 x 3.3 cm, dilated left common iliac artery 2 cm Previous echocardiogram EF 55-65% Mild to moderate AS     PMH:   has a past medical history of AAA (abdominal aortic aneurysm) ([place]); Aortic stenosis; Basal cell carcinoma of nose (1980s); Bruises easily; CAD (coronary artery disease); Carotid artery stenosis; CRI (chronic renal insufficiency); Depression (2006); Full dentures; GERD (gastroesophageal reflux disease); HTN (hypertension); Hyperlipidemia; Hyperparathyroidism; Hypothyroidism; Incontinence; Myocardial infarction [facility]) (2007); PAD (peripheral artery disease) ([place]); Renal artery stenosis ([place]); Ventral hernia; and Vitamin D deficiency.  PSH:    Past Surgical History:  Procedure Laterality Date  . ABDOMINAL AORTIC ANEURYSM REPAIR  1995, 07/01/2005  . ANGIOPLASTY  2005 - 07/01/2005  . APPENDECTOMY  1954  . breast biospy  1996  . BREAST SURGERY    . CATARACT EXTRACTION, BILATERAL    . CHOLECYSTECTOMY  07/01/2005  . CHOLECYSTECTOMY  1987  . COLONOSCOPY  07/07/2005   Colonic erosions, ulcers proctitis  . COLONOSCOPY W/ ENDOSCOPIC [place]  07/01/2005   [place] kidney diffuse atrophy  . CORONARY STENT PLACEMENT    .  CORONARY STENT PLACEMENT  07/2005   MI   . HERNIA REPAIR  1997  . HOSP  04/2005   ? abd. prob. mesenteric ischemia  . HOSP  01/2006   Hosp for reflux (chest pain)  . ILIAC ARTERY STENT  2005 - 07/01/2005   Stent placement, both legs - PVD in arteries  .  NSTEMI  08/13/2005   1/07 s/p stent in circumflex  . TONSILLECTOMY  1943  . UPPER GASTROINTESTINAL ENDOSCOPY  07/07/2005   Nml    Current Outpatient Prescriptions  Medication Sig Dispense Refill  . acetaminophen (TYLENOL) 500 MG tablet Take 1,000 mg by mouth 2 (two) times daily as needed for pain.    Marland Kitchen aspirin EC 81 MG tablet Take 81 mg by mouth at bedtime.    Marland Kitchen atorvastatin (LIPITOR) 80 MG tablet Take 1 tablet (80 mg total) by mouth daily. 90 tablet 3  . Cholecalciferol 1000 UNITS capsule Take 3,000 Units by mouth daily.    . clopidogrel (PLAVIX) 75 MG tablet Take 1 tablet (75 mg total) by mouth daily. 90 tablet 3  . colestipol (COLESTID) 1 g tablet Take 2 tablets (2 g total) by mouth 2 (two) times daily. 360 tablet 3  . estradiol (ESTRACE VAGINAL) 0.1 MG/GM vaginal cream Place 2 g vaginally 2 (two) times a week. Thursday and Sunday    . Febuxostat (ULORIC) 80 MG TABS Take 80 mg by mouth daily.    . isosorbide mononitrate (IMDUR) 30 MG 24 hr tablet TAKE 1 TABLET TWICE A DAY 180 tablet 3  . levothyroxine (SYNTHROID, LEVOTHROID) 112 MCG tablet TAKE 1 TABLET DAILY 90 tablet 0  . metoprolol tartrate (LOPRESSOR) 25 MG tablet TAKE ONE-HALF (1/2) TABLET TWICE A DAY 90 tablet 3   No current facility-administered medications for this visit.      Allergies:   Fish oil; Naproxen; Tramadol; Codeine; Morphine sulfate; and Sulfonamide derivatives   Social History:  The patient  reports that she quit smoking about 11 years ago. She has never used smokeless tobacco. She reports that she does not drink alcohol or use drugs.   Family History:   family history includes Breast cancer in her sister; Cancer in her father, mother, and sister; Heart attack in her father and mother; Heart disease in her father and mother; Hyperlipidemia in her sister, sister, and son; Hypertension in her sister; Stroke in her father and mother; Throat cancer in her father.    Review of Systems: Review of Systems   Constitutional: Negative.   Respiratory: Negative.   Cardiovascular: Negative.   Gastrointestinal: Negative.   Musculoskeletal: Negative.   Neurological: Negative.   Psychiatric/Behavioral: The patient is nervous/anxious.   All other systems reviewed and are negative.    PHYSICAL EXAM: VS:  BP (!) 142/90 (BP Location: Left Arm, Patient Position: Sitting, Cuff Size: Normal)   Pulse 62   Ht 5\' 6"  (1.676 m)   Wt 184 lb 8 oz (83.7 kg)   BMI 29.78 kg/m  , BMI Body mass index is 29.78 kg/m. GEN: Well nourished, well developed, in no acute distress , anxious HEENT: normal  Neck: no JVD, carotid bruits, or masses Cardiac: RRR; 2+ SEM RSB,  No  rubs, or gallops,no edema  Respiratory:  clear to auscultation bilaterally, normal work of breathing GI: soft, nontender, nondistended, + BS MS: no deformity or atrophy  Skin: warm and dry, no rash Neuro:  Strength and sensation are intact Psych: euthymic mood, full affect    Recent Labs: 02/27/2016:  TSH 0.76 06/21/2016: ALT 9; BUN 23; Creatinine, Ser 1.37; Potassium 4.3; Sodium 142    Lipid Panel Lab Results  Component Value Date   CHOL 151 06/21/2016   HDL 35.80 (L) 06/21/2016   LDLCALC 86 11/20/2014   TRIG 234.0 (H) 06/21/2016      Wt Readings from Last 3 Encounters:  01/13/17 184 lb 8 oz (83.7 kg)  08/17/16 173 lb 4 oz (78.6 kg)  08/05/16 172 lb (78 kg)       ASSESSMENT AND PLAN:  Essential hypertension - Plan: EKG 12-Lead Blood pressure is well controlled on today's visit. No changes made to the medications.  Atherosclerosis of native coronary artery without angina pectoris, unspecified whether native or transplanted heart - Currently with no symptoms of angina. No further workup at this time. Continue current medication regimen.We did talk about her uloric. I discussed this also with Dr.  Fletcher Anon We are both not aware of any strong indication to stop the uloric at this time. She has been stable from a cardiac  perspective, has been taking the above medication for 7 years . She feels strongly about staying on the medication .  Recommended she call us if she has any anginal symptoms   Stenosis of right carotid artery  close follow-up with Dr. Oneida Alar Last carotid ultrasounds showed stable disease though severe stenosis   Aortic valve stenosis, etiology of cardiac valve disease unspecified Moderate to severe aortic valve stenosis Repeat echocardiogram has been ordered   Hyperlipidemia Cholesterol is at goal on the current lipid regimen. No changes to the medications were made.  Abdominal aortic aneurysm (AAA) without rupture (HCC)  followed by Dr. Oneida Alar in [place]   PAD (peripheral artery disease) ([place])  significant lower extremity PAD, previous stenting , carotid disease  Stable   Chronic kidney disease (CKD), stage II (mild) Stable   anxiety   previous history of memory issues, anxiety , confusion    Total encounter time more than 25 minutes  Greater than 50% was spent in counseling and coordination of care with the patient   Disposition:   F/U  6 months   No orders of the defined types were placed in this encounter.    Signed, Esmond Plants, M.D., Ph.D. 01/13/2017  [place], [place]

## 2017-01-13 ENCOUNTER — Encounter: Payer: Self-pay | Admitting: Cardiovascular Disease

## 2017-01-13 ENCOUNTER — Ambulatory Visit (INDEPENDENT_AMBULATORY_CARE_PROVIDER_SITE_OTHER): Payer: Medicare Other | Admitting: Cardiovascular Disease

## 2017-01-13 VITALS — BP 142/90 | HR 62 | Ht 66.0 in | Wt 184.5 lb

## 2017-01-13 DIAGNOSIS — I712 Thoracic aortic aneurysm, without rupture: Secondary | ICD-10-CM

## 2017-01-13 DIAGNOSIS — N183 Chronic kidney disease, stage 3 unspecified: Secondary | ICD-10-CM

## 2017-01-13 DIAGNOSIS — I7123 Aneurysm of the descending thoracic aorta, without rupture: Secondary | ICD-10-CM

## 2017-01-13 DIAGNOSIS — I350 Nonrheumatic aortic (valve) stenosis: Secondary | ICD-10-CM

## 2017-01-13 DIAGNOSIS — I6521 Occlusion and stenosis of right carotid artery: Secondary | ICD-10-CM | POA: Diagnosis not present

## 2017-01-13 DIAGNOSIS — I739 Peripheral vascular disease, unspecified: Secondary | ICD-10-CM

## 2017-01-13 DIAGNOSIS — I2510 Atherosclerotic heart disease of native coronary artery without angina pectoris: Secondary | ICD-10-CM

## 2017-01-13 DIAGNOSIS — E7800 Pure hypercholesterolemia, unspecified: Secondary | ICD-10-CM | POA: Diagnosis not present

## 2017-01-13 DIAGNOSIS — I10 Essential (primary) hypertension: Secondary | ICD-10-CM

## 2017-01-13 DIAGNOSIS — F331 Major depressive disorder, recurrent, moderate: Secondary | ICD-10-CM

## 2017-01-13 NOTE — Patient Instructions (Addendum)
Medication Instructions:   No medication changes made  Labwork:  No new labs needed  Testing/Procedures:  We will schedule a echocardiogram for severe aortic valve stenosis Echocardiography is a painless test that uses sound waves to create images of your heart. It provides your doctor with information about the size and shape of your heart and how well your heart's chambers and valves are working. This procedure takes approximately one hour. There are no restrictions for this procedure.   Follow-Up: It was a pleasure seeing you in the office today. Please call us if you have new issues that need to be addressed before your next appt.  [phone]  Your physician wants you to follow-up in: 6 months after echo You will receive a reminder letter in the mail two months in advance. If you don't receive a letter, please call our office to schedule the follow-up appointment.  If you need a refill on your cardiac medications before your next appointment, please call your pharmacy.   Echocardiogram An echocardiogram, or echocardiography, uses sound waves (ultrasound) to produce an image of your heart. The echocardiogram is simple, painless, obtained within a short period of time, and offers valuable information to your health care provider. The images from an echocardiogram can provide information such as:  Evidence of coronary artery disease (CAD).  Heart size.  Heart muscle function.  Heart valve function.  Aneurysm detection.  Evidence of a past heart attack.  Fluid buildup around the heart.  Heart muscle thickening.  Assess heart valve function.  Tell a health care provider about:  Any allergies you have.  All medicines you are taking, including vitamins, herbs, eye drops, creams, and over-the-counter medicines.  Any problems you or family members have had with anesthetic medicines.  Any blood disorders you have.  Any surgeries you have had.  Any medical  conditions you have.  Whether you are pregnant or may be pregnant. What happens before the procedure? No special preparation is needed. Eat and drink normally. What happens during the procedure?  In order to produce an image of your heart, gel will be applied to your chest and a wand-like tool (transducer) will be moved over your chest. The gel will help transmit the sound waves from the transducer. The sound waves will harmlessly bounce off your heart to allow the heart images to be captured in real-time motion. These images will then be recorded.  You may need an IV to receive a medicine that improves the quality of the pictures. What happens after the procedure? You may return to your normal schedule including diet, activities, and medicines, unless your health care provider tells you otherwise. This information is not intended to replace advice given to you by your health care provider. Make sure you discuss any questions you have with your health care provider. Document Released: 06/25/2000 Document Revised: 02/14/2016 Document Reviewed: 03/05/2013 Elsevier Interactive Patient Education  2017 [employer].

## 2017-01-19 ENCOUNTER — Encounter: Payer: Self-pay | Admitting: Family Medicine

## 2017-01-19 ENCOUNTER — Ambulatory Visit (INDEPENDENT_AMBULATORY_CARE_PROVIDER_SITE_OTHER): Payer: Medicare Other | Admitting: Family Medicine

## 2017-01-19 ENCOUNTER — Telehealth: Payer: Self-pay | Admitting: Family Medicine

## 2017-01-19 VITALS — BP 159/98 | HR 67 | Temp 98.0°F | Ht 66.0 in | Wt 182.0 lb

## 2017-01-19 DIAGNOSIS — S8001XA Contusion of right knee, initial encounter: Secondary | ICD-10-CM | POA: Diagnosis not present

## 2017-01-19 DIAGNOSIS — S8011XA Contusion of right lower leg, initial encounter: Secondary | ICD-10-CM | POA: Diagnosis not present

## 2017-01-19 NOTE — Progress Notes (Signed)
   Subjective:    Patient ID: NAJAH LIVERMAN, female    DOB: 1927/10/15, 83 y.o.   MRN: [id number]  HPI Here with her daughter to check her right lower leg. They noticed some bruising over the shin about 6 days ago although she does not remember any trauma. Over the past few days she has had bluish bruising which has spread down the anterior leg to the foot. There is no pain at all. No swelling in either foot. No chest pain or SOB. She does take Plavix and aspirin.    Review of Systems  Constitutional: Negative.   Respiratory: Negative.   Cardiovascular: Negative.   Skin: Positive for color change.       Objective:   Physical Exam  Constitutional: She appears well-developed and well-nourished. No distress.  Cardiovascular: Normal rate, regular rhythm and intact distal pulses.   2/6 SM over the sternum   Pulmonary/Chest: Effort normal and breath sounds normal. No respiratory distress. She has no wheezes. She has no rales.  Musculoskeletal: She exhibits no edema.  The right lower anterior leg has an area of yellowish ecchymosis just below the knee which is mildly tender. Then there are extensive bluish ecchymoses below this all the way to the dorsal foot. No tenderness or swelling or warmth. No calf tenderness or cords felt. Bevelyn Buckles is negative.   Skin:  Extensive varicose veins and telangectasias over both legs          Assessment & Plan:  She appears to have had a contusion to the right lower leg a few days ago which caused some variceal bleeding under the skin. This has seeped down to the foot. I reassured them that there is no sign of a blood clot on the leg. This should all clear up over the next 1-2 weeks. Recheck prn.  Alysia Penna, MD

## 2017-01-19 NOTE — Telephone Encounter (Signed)
Pt has appt with Dr Sarajane Jews 01/19/17 at [facility].

## 2017-01-19 NOTE — Patient Instructions (Signed)
WE NOW OFFER   [facility] FAST TRACK!!!  SAME DAY Appointments for ACUTE CARE  Such as: Sprains, Injuries, cuts, abrasions, rashes, muscle pain, joint pain, back pain Colds, flu, sore throats, headache, allergies, cough, fever  Ear pain, sinus and eye infections Abdominal pain, nausea, vomiting, diarrhea, upset stomach Animal/insect bites  3 Easy Ways to Schedule: Walk-In Scheduling Call in scheduling Mychart Sign-up: [email or web address]         

## 2017-01-19 NOTE — Telephone Encounter (Signed)
Patient Name: [name]  DOB: 1928-05-09    Initial Comment Caller states, her mother has a bruise on the shin on her leg, warmth. She has clotting issues. She is on the blood thinner. Verified    Nurse Assessment  Nurse: Mallie Mussel, RN, Alveta Heimlich Date/Time Eilene Ghazi Time): 01/19/2017 12:58:24 PM  Confirm and document reason for call. If symptomatic, describe symptoms. ---Caller states that her Mother has a bruise on her right shin that was first noticed last weekend. Denies injury. The area feels warm to touch. She denies chest pain but she is wheezing a little. She does not have asthma or COPD. She has SOB with exertion which is normal for her. Denies pain. There is puffiness on the front side of her leg down into her ankle and her foot.  Does the patient have any new or worsening symptoms? ---Yes  Will a triage be completed? ---Yes  Related visit to physician within the last 2 weeks? ---No  Does the PT have any chronic conditions? (i.e. diabetes, asthma, etc.) ---Yes  List chronic conditions. ---DVT, Prior MI, Only 1 Kidney  Is this a behavioral health or substance abuse call? ---No     Guidelines    Guideline Title Affirmed Question Affirmed Notes  Leg Swelling and Edema [1] Thigh, calf, or ankle swelling AND [2] only 1 side    Final Disposition User   See Physician within 4 Hours (or PCP triage) Mallie Mussel, RN, Alveta Heimlich    Comments  No appointments at either [facility] or [facility]. I was able to schedule her to be seen at [facility] at 2:00pm with Dr. Alysia Penna. Georgina Snell did not have any 30 minute time slots left for today.   Disagree/Comply: Comply

## 2017-02-08 ENCOUNTER — Other Ambulatory Visit: Payer: Self-pay | Admitting: Family Medicine

## 2017-02-15 ENCOUNTER — Ambulatory Visit (INDEPENDENT_AMBULATORY_CARE_PROVIDER_SITE_OTHER): Payer: Medicare Other | Admitting: Family Medicine

## 2017-02-15 ENCOUNTER — Encounter: Payer: Self-pay | Admitting: Family Medicine

## 2017-02-15 DIAGNOSIS — M1A9XX0 Chronic gout, unspecified, without tophus (tophi): Secondary | ICD-10-CM

## 2017-02-15 DIAGNOSIS — I10 Essential (primary) hypertension: Secondary | ICD-10-CM | POA: Diagnosis not present

## 2017-02-15 DIAGNOSIS — F325 Major depressive disorder, single episode, in full remission: Secondary | ICD-10-CM

## 2017-02-15 NOTE — Patient Instructions (Signed)
Continue current medications as discussed.  keep up with walking as much as able.

## 2017-02-15 NOTE — Assessment & Plan Note (Signed)
Excellent control off medicaiton.

## 2017-02-15 NOTE — Progress Notes (Signed)
Subjective:    Patient ID: [name], female    DOB: 10-16-1927, 83 y.o.   MRN: [id number]  HPI   83 year old female presents for 6 month follow up appt.  On 01/19/2017 she was seen by Dr. Sarajane Jews  for a contusion of her right lower leg.  Now resolved. No pain or redness in legs. Does have increase varicose veis in legs.  Major depression, moderate, in remission. Stable control on no med.  She is doing well overall.   She gets tired easily. She feels this is due to her.  Wt Readings from Last 3 Encounters:  02/15/17 188 lb 8 oz (85.5 kg)  01/19/17 182 lb (82.6 kg)  01/13/17 184 lb 8 oz (83.7 kg)    Hypertension:   Good control today on current regimen. BP Readings from Last 3 Encounters:  02/15/17 132/70  01/19/17 (!) 159/98  01/13/17 (!) 142/90  Using medication without problems or lightheadedness:  none Chest pain with exertion: none Edema: none Short of breath: none Average home BPs: Other issues: reviewed recent OV note from Dr. Rockey Situ on 01/13/2017 No changes were made at that time. Discussed possible risk cardiac disease  from [place].Marland Kitchen Decided to continue.  Blood pressure 132/70, pulse 62, temperature 97.6 F (36.4 C), temperature source Oral, height 5' 5.5" (1.664 m), weight 188 lb 8 oz (85.5 kg).  Review of Systems  Constitutional: Positive for fatigue. Negative for fever.  HENT: Negative for ear pain.   Eyes: Negative for pain.  Respiratory: Negative for chest tightness and shortness of breath.   Cardiovascular: Negative for chest pain, palpitations and leg swelling.  Gastrointestinal: Negative for abdominal pain.  Genitourinary: Negative for dysuria.       Objective:   Physical Exam  Constitutional: Vital signs are normal. She appears well-developed and well-nourished. She is cooperative.  Non-toxic appearance. She does not appear ill. No distress.  HENT:  Head: Normocephalic.  Right Ear: Hearing, tympanic membrane, external ear and ear canal  normal. Tympanic membrane is not erythematous, not retracted and not bulging.  Left Ear: Hearing, tympanic membrane, external ear and ear canal normal. Tympanic membrane is not erythematous, not retracted and not bulging.  Nose: No mucosal edema or rhinorrhea. Right sinus exhibits no maxillary sinus tenderness and no frontal sinus tenderness. Left sinus exhibits no maxillary sinus tenderness and no frontal sinus tenderness.  Mouth/Throat: Uvula is midline, oropharynx is clear and moist and mucous membranes are normal.  Eyes: Pupils are equal, round, and reactive to light. Conjunctivae, EOM and lids are normal. Lids are everted and swept, no foreign bodies found.  Neck: Trachea normal and normal range of motion. Neck supple. Carotid bruit is not present. No thyroid mass and no thyromegaly present.  Cardiovascular: Normal rate, regular rhythm, S1 normal, S2 normal, intact distal pulses and normal pulses.  Exam reveals no gallop and no friction rub.   Murmur heard.  Systolic murmur is present with a grade of 3/6  Pulmonary/Chest: Effort normal and breath sounds normal. No tachypnea. No respiratory distress. She has no decreased breath sounds. She has no wheezes. She has no rhonchi. She has no rales.  Abdominal: Soft. Normal appearance and bowel sounds are normal. There is no tenderness.  Neurological: She is alert.  Skin: Skin is warm, dry and intact. No rash noted.  Psychiatric: Judgment and thought content normal. Her mood appears not anxious. Her speech is not rapid and/or pressured. She is not agitated and not withdrawn. Cognition and  memory are normal. She does not exhibit a depressed mood. She expresses no homicidal and no suicidal ideation. She expresses no suicidal plans and no homicidal plans.   Smiling and happy at appt.           Assessment & Plan:

## 2017-02-15 NOTE — Assessment & Plan Note (Signed)
Well controlled. Continue current medication.  

## 2017-02-15 NOTE — Assessment & Plan Note (Signed)
Discussed risk of  Cardiac issues with uloric. Pt with out issues. She discucssed with her cardiologist. Plan on continuing uloric given great benefit for her with gout in past.

## 2017-02-17 ENCOUNTER — Other Ambulatory Visit: Payer: Self-pay | Admitting: Cardiovascular Disease

## 2017-02-17 DIAGNOSIS — I350 Nonrheumatic aortic (valve) stenosis: Secondary | ICD-10-CM

## 2017-02-17 DIAGNOSIS — I2510 Atherosclerotic heart disease of native coronary artery without angina pectoris: Secondary | ICD-10-CM

## 2017-02-17 DIAGNOSIS — I712 Thoracic aortic aneurysm, without rupture: Secondary | ICD-10-CM

## 2017-02-17 DIAGNOSIS — I7123 Aneurysm of the descending thoracic aorta, without rupture: Secondary | ICD-10-CM

## 2017-03-09 ENCOUNTER — Other Ambulatory Visit: Payer: Self-pay | Admitting: Dermatology

## 2017-03-28 ENCOUNTER — Other Ambulatory Visit: Payer: Medicare Other

## 2017-03-28 ENCOUNTER — Other Ambulatory Visit: Payer: Self-pay | Admitting: Cardiovascular Disease

## 2017-04-18 ENCOUNTER — Other Ambulatory Visit: Payer: Self-pay

## 2017-04-18 ENCOUNTER — Ambulatory Visit (INDEPENDENT_AMBULATORY_CARE_PROVIDER_SITE_OTHER): Payer: Medicare Other

## 2017-04-18 DIAGNOSIS — I2510 Atherosclerotic heart disease of native coronary artery without angina pectoris: Secondary | ICD-10-CM | POA: Diagnosis not present

## 2017-04-18 DIAGNOSIS — I7123 Aneurysm of the descending thoracic aorta, without rupture: Secondary | ICD-10-CM

## 2017-04-18 DIAGNOSIS — I712 Thoracic aortic aneurysm, without rupture: Secondary | ICD-10-CM

## 2017-04-18 DIAGNOSIS — Z23 Encounter for immunization: Secondary | ICD-10-CM | POA: Diagnosis not present

## 2017-04-18 DIAGNOSIS — I350 Nonrheumatic aortic (valve) stenosis: Secondary | ICD-10-CM | POA: Diagnosis not present

## 2017-05-10 ENCOUNTER — Other Ambulatory Visit: Payer: Self-pay | Admitting: Family Medicine

## 2017-05-10 NOTE — Telephone Encounter (Addendum)
Last office visit 02/15/2017.  Last thyroid labs 02/27/2016.  [place] scheduled for 08/26/2017.  Okay to refill?

## 2017-05-24 ENCOUNTER — Ambulatory Visit (INDEPENDENT_AMBULATORY_CARE_PROVIDER_SITE_OTHER): Payer: Medicare Other | Admitting: Family Medicine

## 2017-05-24 ENCOUNTER — Encounter: Payer: Self-pay | Admitting: Family Medicine

## 2017-05-24 ENCOUNTER — Other Ambulatory Visit: Payer: Self-pay | Admitting: Family Medicine

## 2017-05-24 ENCOUNTER — Ambulatory Visit: Payer: Self-pay | Admitting: *Deleted

## 2017-05-24 VITALS — BP 150/98 | HR 62 | Temp 97.8°F | Wt 186.2 lb

## 2017-05-24 DIAGNOSIS — F331 Major depressive disorder, recurrent, moderate: Secondary | ICD-10-CM | POA: Diagnosis not present

## 2017-05-24 DIAGNOSIS — F411 Generalized anxiety disorder: Secondary | ICD-10-CM | POA: Insufficient documentation

## 2017-05-24 NOTE — Progress Notes (Signed)
   Subjective:    Patient ID: [name], female    DOB: Apr 25, 1928, 83 y.o.   MRN: [id number]  HPI   83 year old female presents with her daughter to discuss her mood. She had been doing very well with depression off medication ( was on sertraline). No SE in past.  She reports that  She has been feeling more isolated, more worried. Anxious and nervous. She does not want to bother her family.   Daughter spends a lot of time with her, calls every day. She focuses on the days the family missed.  She dread being by herself at night. She live on her own.  She and family are considering her move to assisted living area.  She has thoughts about her hurting herself.. She just wants to pass.  BP Readings from Last 3 Encounters:  05/24/17 (!) 150/98  02/15/17 132/70  01/19/17 (!) 159/98    Review of Systems  Constitutional: Negative for fatigue and fever.  HENT: Negative for ear pain.   Eyes: Negative for pain.  Respiratory: Negative for chest tightness and shortness of breath.   Cardiovascular: Negative for chest pain, palpitations and leg swelling.  Gastrointestinal: Negative for abdominal pain.  Genitourinary: Negative for dysuria.       Objective:   Physical Exam  Constitutional: Vital signs are normal. She appears well-developed and well-nourished. She is cooperative.  Non-toxic appearance. She does not appear ill. No distress.  HENT:  Head: Normocephalic.  Right Ear: Hearing, tympanic membrane, external ear and ear canal normal. Tympanic membrane is not erythematous, not retracted and not bulging.  Left Ear: Hearing, tympanic membrane, external ear and ear canal normal. Tympanic membrane is not erythematous, not retracted and not bulging.  Nose: No mucosal edema or rhinorrhea. Right sinus exhibits no maxillary sinus tenderness and no frontal sinus tenderness. Left sinus exhibits no maxillary sinus tenderness and no frontal sinus tenderness.  Mouth/Throat: Uvula is midline,  oropharynx is clear and moist and mucous membranes are normal.  Eyes: Conjunctivae, EOM and lids are normal. Pupils are equal, round, and reactive to light. Lids are everted and swept, no foreign bodies found.  Neck: Trachea normal and normal range of motion. Neck supple. Carotid bruit is not present. No thyroid mass and no thyromegaly present.  Cardiovascular: Normal rate, regular rhythm, S1 normal, S2 normal, normal heart sounds, intact distal pulses and normal pulses. Exam reveals no gallop and no friction rub.  No murmur heard. Pulmonary/Chest: Effort normal and breath sounds normal. No tachypnea. No respiratory distress. She has no decreased breath sounds. She has no wheezes. She has no rhonchi. She has no rales.  Abdominal: Soft. Normal appearance and bowel sounds are normal. There is no tenderness.  Neurological: She is alert.  Skin: Skin is warm, dry and intact. No rash noted.  Psychiatric: Her speech is normal. Her mood appears anxious. She is agitated. Cognition and memory are impaired. She expresses impulsivity and inappropriate judgment. She exhibits a depressed mood. She expresses suicidal ideation. She expresses no suicidal plans and no homicidal plans.          Assessment & Plan:

## 2017-05-24 NOTE — Patient Instructions (Addendum)
Start sertraline 50 mg daily.  Follow BP at home.. Goal < 140/90.

## 2017-05-24 NOTE — Assessment & Plan Note (Signed)
Seasonal component.  restart sertraline 50 mg daily.  Follow up in 4 weeks.  Discussed change of living situation.

## 2017-05-24 NOTE — Telephone Encounter (Addendum)
Pt's daughter Salina April called wanting to know if her mother could restart zoloft; Pt's daughter states that she was previously taking zoloft approximately 1 year ago but decided that she did not want to take it anymore; Dr Diona Browner was notified and the pt was weaned off of the medication; Pt's daughter states that the pt has been harder to manage over the weekend and that yesterday the pt talked about "wanting to end it" but per her daughter pt is better since she is now at her house and "has not talked about ending it";  today pt "seems negative and does not hear any good" per the patient's daughter; [name] is most concerned about the pt's anxiety;  Daughter also states that pt's symptoms worsen when the weather gets colder Routed to [place]; discussed with Mearl Latin; Pt scheduled for appt today at 12 noon with Dr Diona Browner  ; Reason for Disposition . Patient sounds very upset or troubled to the triager  Answer Assessment - Initial Assessment Questions 1. CONCERN: "What happened that made you call today?"     Increasing anxiety over the past 3 weeks  2. ANXIETY SYMPTOM SCREENING: "Can you describe how you have been feeling?"  (e.g., tense, restless, panicky, anxious, keyed up, trouble sleeping, trouble concentrating)     Anxious, keyed up problems sleeping unless at daughter's house; inconsolable yesterday 3. ONSET: "How long have you been feeling this way?"     approximately3 weeks  4. RECURRENT: "Have you felt this way before?"  If yes: "What happened that time?" "What helped these feelings go away in the past?"      Onset of cold weather; previously taking zoloft  5. RISK OF HARM - SUICIDAL IDEATION:  "Do you ever have thoughts of hurting or killing yourself?"  (e.g., yes, no, no but preoccupation with thoughts about death)   - INTENT:  "Do you have thoughts of hurting or killing yourself right NOW?"    - PLAN: "Do you have a specific plan for how you would do this?" (e.g., gun, knife, overdose, no  plan, N/A)     Daughter states that yesterday pt was thrashing and saying she wants it to end but is now better since she has been at her daughter's home 6. RISK OF HARM - HOMICIDAL IDEATION:  "Do you ever have thoughts of hurting or killing someone else?"  (e.g., yes, no, no but preoccupation with thoughts about death)   - INTENT:  "Do you have thoughts of hurting or killing someone right NOW?" (e.g., yes, no, N/A)   - PLAN: "Do you have a specific plan for how you would do this?" (e.g., gun, knife, no plan, N/A)      no 7. FUNCTIONAL IMPAIRMENT: "How have things been going for you overall in your life? Have you had any more difficulties than usual doing your normal daily activities?"  (e.g., better, same, worse; self-care, school, work, interactions)     same 8. SUPPORT: "Who is with you now?" "Who do you live with?" "Do you have family or friends nearby who you can talk to?"      Pt lives alone but is currently at her daughter's house Salina April) 9. THERAPIST: "Do you have a counselor or therapist? Name?"     No pt would not agree to this 10. STRESSORS: "Has there been any new stress or recent changes in your life?"       No, daughter has not identified any new stressors or changes  11. CAFFEINE ABUSE: "Do you drink caffeinated beverages, and how much each day?" (e.g., coffee, tea, colas)        1 cup of coffee in am with breakfst 12. SUBSTANCE ABUSE: "Do you use any illegal drugs or alcohol?"       no 13. OTHER SYMPTOMS: "Do you have any other physical symptoms right now?" (e.g., chest pain, palpitations, difficulty breathing, fever)       none 14. PREGNANCY: "Is there any chance you are pregnant?" "When was your last menstrual period?"       n/a  Protocols used: ANXIETY AND PANIC ATTACK-A-AH

## 2017-05-24 NOTE — Telephone Encounter (Signed)
Pt has appt to see Dr Diona Browner 05/24/17 at 12 noon.

## 2017-06-13 ENCOUNTER — Other Ambulatory Visit: Payer: Self-pay | Admitting: Cardiovascular Disease

## 2017-06-28 ENCOUNTER — Observation Stay
Admission: EM | Admit: 2017-06-28 | Discharge: 2017-06-30 | Disposition: A | Discharge status: Skilled Nursing Facility | Payer: Medicare Other | Attending: Specialist | Admitting: Specialist

## 2017-06-28 ENCOUNTER — Ambulatory Visit (INDEPENDENT_AMBULATORY_CARE_PROVIDER_SITE_OTHER)
Admission: RE | Admit: 2017-06-28 | Discharge: 2017-06-28 | Disposition: A | Discharge status: Home / Self Care | Payer: Medicare Other | Source: Ambulatory Visit | Attending: Family Medicine | Admitting: Family Medicine

## 2017-06-28 ENCOUNTER — Ambulatory Visit
Admission: RE | Admit: 2017-06-28 | Discharge: 2017-06-28 | Disposition: A | Discharge status: Home / Self Care | Payer: Medicare Other | Source: Ambulatory Visit | Attending: Family Medicine | Admitting: Family Medicine

## 2017-06-28 ENCOUNTER — Other Ambulatory Visit: Payer: Self-pay

## 2017-06-28 ENCOUNTER — Telehealth: Payer: Self-pay | Admitting: Family Medicine

## 2017-06-28 ENCOUNTER — Encounter: Payer: Self-pay | Admitting: *Deleted

## 2017-06-28 ENCOUNTER — Encounter: Payer: Self-pay | Admitting: Family Medicine

## 2017-06-28 ENCOUNTER — Ambulatory Visit (INDEPENDENT_AMBULATORY_CARE_PROVIDER_SITE_OTHER): Payer: Medicare Other | Admitting: Family Medicine

## 2017-06-28 ENCOUNTER — Encounter: Payer: Self-pay | Admitting: Internal Medicine

## 2017-06-28 VITALS — BP 134/78 | HR 82 | Temp 97.5°F

## 2017-06-28 DIAGNOSIS — S99912A Unspecified injury of left ankle, initial encounter: Secondary | ICD-10-CM

## 2017-06-28 DIAGNOSIS — E785 Hyperlipidemia, unspecified: Secondary | ICD-10-CM | POA: Diagnosis not present

## 2017-06-28 DIAGNOSIS — I714 Abdominal aortic aneurysm, without rupture: Secondary | ICD-10-CM | POA: Diagnosis not present

## 2017-06-28 DIAGNOSIS — Z882 Allergy status to sulfonamides status: Secondary | ICD-10-CM | POA: Insufficient documentation

## 2017-06-28 DIAGNOSIS — E039 Hypothyroidism, unspecified: Secondary | ICD-10-CM | POA: Diagnosis not present

## 2017-06-28 DIAGNOSIS — S82831A Other fracture of upper and lower end of right fibula, initial encounter for closed fracture: Secondary | ICD-10-CM | POA: Insufficient documentation

## 2017-06-28 DIAGNOSIS — Y92009 Unspecified place in unspecified non-institutional (private) residence as the place of occurrence of the external cause: Secondary | ICD-10-CM | POA: Diagnosis not present

## 2017-06-28 DIAGNOSIS — N183 Chronic kidney disease, stage 3 unspecified: Secondary | ICD-10-CM | POA: Diagnosis present

## 2017-06-28 DIAGNOSIS — W1830XA Fall on same level, unspecified, initial encounter: Secondary | ICD-10-CM | POA: Insufficient documentation

## 2017-06-28 DIAGNOSIS — I10 Essential (primary) hypertension: Secondary | ICD-10-CM | POA: Diagnosis present

## 2017-06-28 DIAGNOSIS — I2510 Atherosclerotic heart disease of native coronary artery without angina pectoris: Secondary | ICD-10-CM | POA: Diagnosis present

## 2017-06-28 DIAGNOSIS — S99911A Unspecified injury of right ankle, initial encounter: Secondary | ICD-10-CM | POA: Diagnosis not present

## 2017-06-28 DIAGNOSIS — M109 Gout, unspecified: Secondary | ICD-10-CM | POA: Diagnosis not present

## 2017-06-28 DIAGNOSIS — K219 Gastro-esophageal reflux disease without esophagitis: Secondary | ICD-10-CM | POA: Diagnosis present

## 2017-06-28 DIAGNOSIS — Z7982 Long term (current) use of aspirin: Secondary | ICD-10-CM | POA: Diagnosis not present

## 2017-06-28 DIAGNOSIS — S99922A Unspecified injury of left foot, initial encounter: Secondary | ICD-10-CM | POA: Diagnosis not present

## 2017-06-28 DIAGNOSIS — S99921A Unspecified injury of right foot, initial encounter: Secondary | ICD-10-CM

## 2017-06-28 DIAGNOSIS — F331 Major depressive disorder, recurrent, moderate: Secondary | ICD-10-CM | POA: Diagnosis not present

## 2017-06-28 DIAGNOSIS — F411 Generalized anxiety disorder: Secondary | ICD-10-CM

## 2017-06-28 DIAGNOSIS — Z79899 Other long term (current) drug therapy: Secondary | ICD-10-CM | POA: Diagnosis not present

## 2017-06-28 DIAGNOSIS — M6281 Muscle weakness (generalized): Secondary | ICD-10-CM | POA: Diagnosis not present

## 2017-06-28 DIAGNOSIS — I252 Old myocardial infarction: Secondary | ICD-10-CM | POA: Diagnosis not present

## 2017-06-28 DIAGNOSIS — W07XXXA Fall from chair, initial encounter: Secondary | ICD-10-CM | POA: Diagnosis not present

## 2017-06-28 DIAGNOSIS — F329 Major depressive disorder, single episode, unspecified: Secondary | ICD-10-CM | POA: Diagnosis not present

## 2017-06-28 DIAGNOSIS — M8000XA Age-related osteoporosis with current pathological fracture, unspecified site, initial encounter for fracture: Secondary | ICD-10-CM

## 2017-06-28 DIAGNOSIS — S92355A Nondisplaced fracture of fifth metatarsal bone, left foot, initial encounter for closed fracture: Secondary | ICD-10-CM | POA: Diagnosis present

## 2017-06-28 DIAGNOSIS — Z885 Allergy status to narcotic agent status: Secondary | ICD-10-CM | POA: Diagnosis not present

## 2017-06-28 DIAGNOSIS — Z85828 Personal history of other malignant neoplasm of skin: Secondary | ICD-10-CM | POA: Diagnosis not present

## 2017-06-28 DIAGNOSIS — I129 Hypertensive chronic kidney disease with stage 1 through stage 4 chronic kidney disease, or unspecified chronic kidney disease: Secondary | ICD-10-CM | POA: Insufficient documentation

## 2017-06-28 DIAGNOSIS — E559 Vitamin D deficiency, unspecified: Secondary | ICD-10-CM | POA: Insufficient documentation

## 2017-06-28 DIAGNOSIS — Z7902 Long term (current) use of antithrombotics/antiplatelets: Secondary | ICD-10-CM | POA: Insufficient documentation

## 2017-06-28 DIAGNOSIS — S82892A Other fracture of left lower leg, initial encounter for closed fracture: Secondary | ICD-10-CM | POA: Diagnosis present

## 2017-06-28 LAB — BASIC METABOLIC PANEL
Anion gap: 10 (ref 5–15)
BUN: 24 mg/dL — AB (ref 6–20)
CALCIUM: 9.4 mg/dL (ref 8.9–10.3)
CO2: 26 mmol/L (ref 22–32)
CREATININE: 1.42 mg/dL — AB (ref 0.44–1.00)
Chloride: 104 mmol/L (ref 101–111)
GFR calc Af Amer: 37 mL/min — ABNORMAL LOW (ref >60)
GFR, EST NON AFRICAN AMERICAN: 32 mL/min — AB (ref >60)
GLUCOSE: 98 mg/dL (ref 65–99)
Potassium: 3.9 mmol/L (ref 3.5–5.1)
SODIUM: 140 mmol/L (ref 135–145)

## 2017-06-28 LAB — CBC
HCT: 40.6 % (ref 35.0–47.0)
Hemoglobin: 13.7 g/dL (ref 12.0–16.0)
MCH: 29.5 pg (ref 26.0–34.0)
MCHC: 33.7 g/dL (ref 32.0–36.0)
MCV: 87.7 fL (ref 80.0–100.0)
PLATELETS: 142 10*3/uL — AB (ref 150–440)
RBC: 4.63 MIL/uL (ref 3.80–5.20)
RDW: 16.2 % — AB (ref 11.5–14.5)
WBC: 11 10*3/uL (ref 3.6–11.0)

## 2017-06-28 MED ORDER — HYDRALAZINE HCL 20 MG/ML IJ SOLN: 10.0000 mg | INTRAMUSCULAR | Status: DC | PRN

## 2017-06-28 MED ORDER — ACETAMINOPHEN 325 MG PO TABS
650.0000 mg | ORAL_TABLET | Freq: Four times a day (QID) | ORAL | Status: DC | PRN
  Administered 2017-06-29: 650 mg via ORAL
  Filled 2017-06-28: qty 2

## 2017-06-28 MED ORDER — ACETAMINOPHEN 650 MG RE SUPP: 650.0000 mg | Freq: Four times a day (QID) | RECTAL | Status: DC | PRN

## 2017-06-28 MED ORDER — ASPIRIN EC 81 MG PO TBEC
81.0000 mg | DELAYED_RELEASE_TABLET | Freq: Every day | ORAL | Status: DC
  Administered 2017-06-29 (×2): 81 mg via ORAL
  Filled 2017-06-28 (×2): qty 1

## 2017-06-28 MED ORDER — ISOSORBIDE MONONITRATE ER 30 MG PO TB24
30.0000 mg | ORAL_TABLET | Freq: Two times a day (BID) | ORAL | Status: DC
  Administered 2017-06-29 – 2017-06-30 (×4): 30 mg via ORAL
  Filled 2017-06-28 (×4): qty 1

## 2017-06-28 MED ORDER — FEBUXOSTAT 40 MG PO TABS
40.0000 mg | ORAL_TABLET | Freq: Every day | ORAL | Status: DC
  Administered 2017-06-29 – 2017-06-30 (×2): 40 mg via ORAL
  Filled 2017-06-28 (×2): qty 1

## 2017-06-28 MED ORDER — HEPARIN SODIUM (PORCINE) 5000 UNIT/ML IJ SOLN
5000.0000 [IU] | Freq: Three times a day (TID) | INTRAMUSCULAR | Status: DC
Start: 2017-06-28 — End: 2017-06-30
  Administered 2017-06-29 – 2017-06-30 (×4): 5000 [IU] via SUBCUTANEOUS
  Filled 2017-06-28 (×4): qty 1

## 2017-06-28 MED ORDER — LEVOTHYROXINE SODIUM 112 MCG PO TABS
112.0000 ug | ORAL_TABLET | Freq: Every day | ORAL | Status: DC
  Administered 2017-06-29 – 2017-06-30 (×2): 112 ug via ORAL
  Filled 2017-06-28 (×2): qty 1

## 2017-06-28 MED ORDER — ATORVASTATIN CALCIUM 20 MG PO TABS
80.0000 mg | ORAL_TABLET | Freq: Every day | ORAL | Status: DC
  Administered 2017-06-29: 80 mg via ORAL
  Filled 2017-06-28: qty 4

## 2017-06-28 MED ORDER — METOPROLOL TARTRATE 25 MG PO TABS
12.5000 mg | ORAL_TABLET | Freq: Two times a day (BID) | ORAL | Status: DC
  Administered 2017-06-29 – 2017-06-30 (×3): 12.5 mg via ORAL
  Filled 2017-06-28 (×3): qty 1

## 2017-06-28 MED ORDER — CLOPIDOGREL BISULFATE 75 MG PO TABS
75.0000 mg | ORAL_TABLET | Freq: Every day | ORAL | Status: DC
  Administered 2017-06-29 – 2017-06-30 (×2): 75 mg via ORAL
  Filled 2017-06-28 (×2): qty 1

## 2017-06-28 MED ORDER — ONDANSETRON HCL 4 MG PO TABS: 4.0000 mg | ORAL_TABLET | Freq: Four times a day (QID) | ORAL | Status: DC | PRN

## 2017-06-28 MED ORDER — ONDANSETRON HCL 4 MG/2ML IJ SOLN: 4.0000 mg | Freq: Four times a day (QID) | INTRAMUSCULAR | Status: DC | PRN

## 2017-06-28 MED ORDER — SERTRALINE HCL 50 MG PO TABS
50.0000 mg | ORAL_TABLET | Freq: Every day | ORAL | Status: DC
  Administered 2017-06-29 – 2017-06-30 (×2): 50 mg via ORAL
  Filled 2017-06-28 (×2): qty 1

## 2017-06-28 NOTE — Assessment & Plan Note (Signed)
No clear fracture seen.. likely foot sprain. Refer to [facility] given other injuries.

## 2017-06-28 NOTE — Patient Instructions (Addendum)
Please stop at the front desk to set up referral.  Elevate feet, no weight bearing, ice and appt with ORTHO ASAP.  Tylenol for pain  As needed until seen by [facility]

## 2017-06-28 NOTE — Assessment & Plan Note (Signed)
Refer to ortho for recommendations on further treatment.  Tylenol and ice for now. No weight bearing.

## 2017-06-28 NOTE — Progress Notes (Signed)
Subjective:    Patient ID: [name], female    DOB: [birth year], 83 y.o.   MRN: [id number]  HPI   83 year old female with osteoporosis  pt presents for follow up depression, but right before leaving home she had a new fall.  Accidental fall..   She tripped  on shoes and fell forward from couch onto recliner twisting both ankles backward and landing on her knees.  She is unable to weight bear without severe pain.  She has mild/moderate pain non-weight bearing , swelling and bruising in bilateral lateral ankles and dorsal feet  no knee pain or swelling.  Hx of osteoporosis   Moderate depression, major, GAD: At last OV 1 month ago she was restarted on sertraline 50 mg daily  She has had significant improvement in mood. No longer tearful. Less anxious , able to sleep better. Able to calm herself down. Less worried about everything. Going out more, able to aty active. No SE on sertraline.   Review of Systems  Constitutional: Negative for fatigue and fever.  HENT: Negative for congestion.   Eyes: Negative for pain.  Respiratory: Negative for cough and shortness of breath.   Cardiovascular: Negative for chest pain, palpitations and leg swelling.  Gastrointestinal: Negative for abdominal pain.  Genitourinary: Negative for dysuria and vaginal bleeding.  Musculoskeletal: Negative for back pain.  Neurological: Negative for syncope, light-headedness and headaches.  Psychiatric/Behavioral: Negative for dysphoric mood.       Objective:   Physical Exam  Constitutional: Vital signs are normal. She appears well-developed and well-nourished. She is cooperative.  Non-toxic appearance. She does not appear ill. No distress.  HENT:  Head: Normocephalic.  Right Ear: Hearing, tympanic membrane, external ear and ear canal normal. Tympanic membrane is not erythematous, not retracted and not bulging.  Left Ear: Hearing, tympanic membrane, external ear and ear canal normal. Tympanic membrane  is not erythematous, not retracted and not bulging.  Nose: No mucosal edema or rhinorrhea. Right sinus exhibits no maxillary sinus tenderness and no frontal sinus tenderness. Left sinus exhibits no maxillary sinus tenderness and no frontal sinus tenderness.  Mouth/Throat: Uvula is midline, oropharynx is clear and moist and mucous membranes are normal.  Eyes: Conjunctivae, EOM and lids are normal. Pupils are equal, round, and reactive to light. Lids are everted and swept, no foreign bodies found.  Neck: Trachea normal and normal range of motion. Neck supple. Carotid bruit is not present. No thyroid mass and no thyromegaly present.  Cardiovascular: Normal rate, regular rhythm, S1 normal, S2 normal, normal heart sounds, intact distal pulses and normal pulses. Exam reveals no gallop and no friction rub.  No murmur heard. Pulmonary/Chest: Effort normal and breath sounds normal. No tachypnea. No respiratory distress. She has no decreased breath sounds. She has no wheezes. She has no rhonchi. She has no rales.  Abdominal: Soft. Normal appearance and bowel sounds are normal. There is no tenderness.  Musculoskeletal:       Right ankle: She exhibits decreased range of motion, swelling and ecchymosis. Tenderness. Lateral malleolus tenderness found.       Left ankle: She exhibits decreased range of motion, swelling and ecchymosis. Tenderness. Lateral malleolus tenderness found.       Right foot: There is tenderness and swelling.       Left foot: There is tenderness and swelling.  Pain mainly over lateral malleolus bialtereally and over dorsal feet  Pt unable to weight bear given pain  Neurological: She is alert.  Skin:  Skin is warm, dry and intact. No rash noted.  Psychiatric: Her speech is normal and behavior is normal. Judgment and thought content normal. Her mood appears not anxious. Cognition and memory are normal. She does not exhibit a depressed mood.          Assessment & Plan:

## 2017-06-28 NOTE — Telephone Encounter (Signed)
Dr. Ronnald Ramp, on call physician notified of X-ray results.

## 2017-06-28 NOTE — Assessment & Plan Note (Signed)
Improved control on SSRI. Can try melatonin at night for sleep.

## 2017-06-28 NOTE — Telephone Encounter (Signed)
I was called by the answering service about abnormal x-rays.  The radiologist reports that there are broken bones in both ankles and both feet.  I spoke to the patient's son.  He told me she cannot bear weight on her lower extremities and they are calling EMS to transport her to the hospital.

## 2017-06-28 NOTE — Telephone Encounter (Signed)
Carloyn Manner calling with impression done by Dr. Donavan Foil for xray of right ankle:Large amount of soft tissue swelling with acute mildly displaced distal fibular fracture. Results will also be faxed to office.

## 2017-06-28 NOTE — ED Provider Notes (Signed)
[facility] Emergency Department Provider Note  Time seen: 7:10 PM  I have reviewed the triage vital signs and the nursing notes.   HISTORY  Chief Complaint No chief complaint on file.    HPI [name] is a 83 y.o. female with a past medical history of CAD, mild renal insufficiency, gastric reflux, hypertension, hyperlipidemia, presents to the emergency department with bilateral foot pain.  According to the patient this morning she had a fall where she fell onto her knees with her feet tucked underneath her.  Denies hitting her head.  Denies LOC.  Patient went to her primary care doctor who referred her to orthopedic urgent care.  Per patient she was diagnosed with fractures of both ankles and feet and they were placed in splints.  Patient has a splint placed on the left lower extremity and a fracture boot to the right lower extremity.  Per patient she was told not to bear weight for 6-8 weeks.  Patient states she was also not given any pain medication.  Patient states she lives alone and has no way to get around her house.  Patient returns to the emergency department as she is not able to care for herself and has moderate pain in both of her feet and ankles.   Past Medical History:  Diagnosis Date  . AAA (abdominal aortic aneurysm) (HCC)    Recurrent; s/p repair, now 3.8 cam; h/o ischemic bowel 09-Oct-2004  . Aortic stenosis    Moderate; Echo 12/09. moderate AS mean 19. AVA 0.88  . Basal cell carcinoma of nose 1980s  . Bruises easily   . CAD (coronary artery disease)    s/p NSTEMI 1/07 with BMS to OM; cath 12/09 due to positive Myoview, LM ok. LAD 40%. LCX 80-90% mid. RCA chronically occluded with R --> L collats  . Carotid artery stenosis    R 60-79% L 0-39% (6/10) - stable  . CRI (chronic renal insufficiency)    Mild, Cr 1.3  . Depression 10/09/04   With death of spouse  . Full dentures   . GERD (gastroesophageal reflux disease)   . HTN (hypertension)   .  Hyperlipidemia    Increased LFTs on Vytorin. Changed to Lipitor.  . Hyperparathyroidism   . Hypothyroidism   . Incontinence   . Myocardial infarction ([place]) 10-09-2005  . PAD (peripheral artery disease) (HCC)    s/p iliac stents  . Renal artery stenosis (HCC)    Atrophic L kidney. Moderate blockage on R  . Ventral hernia    Moderate sized  . Vitamin D deficiency     Patient Active Problem List   Diagnosis Date Noted  . Foot injury, left, initial encounter 06/28/2017  . Ankle injury, right, initial encounter 06/28/2017  . Ankle injury, left, initial encounter 06/28/2017  . Foot injury, right, initial encounter 06/28/2017  . Accidental fall from chair 06/28/2017  . Closed fracture of right distal fibula 06/28/2017  . GAD (generalized anxiety disorder) 05/24/2017  . Major depressive disorder, recurrent episode, moderate ([place]) 05/25/2016  . Counseling regarding end of life decision making 12/13/2014  . Community acquired pneumonia 05/30/2014  . Descending thoracic aortic aneurysm ([employer]) 05/30/2014  . Vitamin D deficiency 12/26/2012  . Uterine prolapse 06/23/2012  . Gout 06/06/2012  . INSOMNIA, CHRONIC, MILD 07/10/2010  . SENILE OSTEOPOROSIS 07/08/2009  . Carotid stenosis 11/01/2008  . Aortic valve stenosis 09/25/2008  . Abdominal aortic aneurysm ([place]) 09/24/2008  . LOW BACK PAIN, MILD 07/24/2008  .  Coronary atherosclerosis 06/21/2008  . VENTRAL HERNIA 05/16/2008  . PULMONARY NODULE, RIGHT MIDDLE LOBE 02/01/2008  . CYSTOCELE WITH INCOMPLETE UTERINE PROLAPSE 02/20/2007  . Hypothyroidism 02/17/2007  . Hyperlipidemia 02/17/2007  . Essential hypertension 02/17/2007  . PAD (peripheral artery disease) ([place]) 02/17/2007  . GERD 02/17/2007  . CKD (chronic kidney disease) stage 3, GFR 30-59 ml/min (HCC) 02/17/2007  . ISCHEMIC COLITIS 05/19/2005    Past Surgical History:  Procedure Laterality Date  . ABDOMINAL AORTIC ANEURYSM REPAIR  1995, 07/01/2005  . ANGIOPLASTY  2005 - 07/01/2005  .  APPENDECTOMY  1954  . breast biospy  1996  . BREAST SURGERY    . CATARACT EXTRACTION, BILATERAL    . CHOLECYSTECTOMY  07/01/2005  . CHOLECYSTECTOMY  1987  . COLONOSCOPY  07/07/2005   Colonic erosions, ulcers proctitis  . COLONOSCOPY W/ ENDOSCOPIC [place]  07/01/2005   [place] kidney diffuse atrophy  . CORONARY STENT PLACEMENT    . CORONARY STENT PLACEMENT  07/2005   MI   . HERNIA REPAIR  1997  . HOSP  04/2005   ? abd. prob. mesenteric ischemia  . HOSP  01/2006   Hosp for reflux (chest pain)  . ILIAC ARTERY STENT  2005 - 07/01/2005   Stent placement, both legs - PVD in arteries  . NSTEMI  08/13/2005   1/07 s/p stent in circumflex  . TONSILLECTOMY  1943  . UPPER GASTROINTESTINAL ENDOSCOPY  07/07/2005   Nml    Prior to Admission medications   Medication Sig Start Date End Date Taking? Authorizing Provider  acetaminophen (TYLENOL) 500 MG tablet Take 1,000 mg by mouth 2 (two) times daily as needed for pain.    [provider]  aspirin EC 81 MG tablet Take 81 mg by mouth at bedtime.    [provider]  atorvastatin (LIPITOR) 80 MG tablet Take 1 tablet (80 mg total) by mouth daily. 07/19/16   Bedsole, Amy E, MD  Cholecalciferol 1000 UNITS capsule Take 3,000 Units by mouth daily.    [provider]  clopidogrel (PLAVIX) 75 MG tablet Take 1 tablet (75 mg total) by mouth daily. 07/20/16   Bedsole, Amy E, MD  colestipol (COLESTID) 1 g tablet Take 2 tablets (2 g total) by mouth 2 (two) times daily. 07/19/16   Bedsole, Amy E, MD  estradiol (ESTRACE VAGINAL) 0.1 MG/GM vaginal cream Place 2 g vaginally 2 (two) times a week. Thursday and Sunday    [provider]  Febuxostat (ULORIC) 80 MG TABS Take 80 mg by mouth daily.    [provider]  isosorbide mononitrate (IMDUR) 30 MG 24 hr tablet TAKE 1 TABLET TWICE A DAY 06/13/17   Gollan, Kathlene November, MD  levothyroxine (SYNTHROID, LEVOTHROID) 112 MCG tablet TAKE 1 TABLET DAILY 05/10/17   Bedsole, Amy E, MD  metoprolol  tartrate (LOPRESSOR) 25 MG tablet TAKE ONE-HALF (1/2) TABLET TWICE A DAY 03/28/17   Gollan, Kathlene November, MD  sertraline (ZOLOFT) 50 MG tablet TAKE 1 TABLET (50 MG TOTAL) BY MOUTH DAILY. 05/24/17   Jinny Sanders, MD    Allergies  Allergen Reactions  . Fish Oil Other (See Comments)    Severe indigestion  . Naproxen Nausea And Vomiting  . Tramadol Nausea And Vomiting  . Codeine Nausea And Vomiting and Rash  . Morphine Sulfate Nausea And Vomiting and Rash  . Sulfonamide Derivatives Nausea And Vomiting and Rash    Family History  Problem Relation Age of Onset  . Heart attack Mother  MI  . Cancer Mother   . Heart disease Mother        heart attack  . Stroke Mother   . Cancer Father        throat  . Heart attack Father        MI  . Throat cancer Father   . Heart disease Father        heart attack   . Stroke Father   . Cancer Sister        breast  . Breast cancer Sister   . Hyperlipidemia Sister   . Hyperlipidemia Sister   . Hypertension Sister   . Hyperlipidemia Son   . Colon cancer Neg Hx     Social History Social History   Tobacco Use  . Smoking status: Former Smoker    Last attempt to quit: 07/12/2005    Years since quitting: 11.9  . Smokeless tobacco: Never Used  . Tobacco comment: quit in 1997  Substance Use Topics  . Alcohol use: No  . Drug use: No    Review of Systems Constitutional: Negative for fever. Cardiovascular: Negative for chest pain. Respiratory: Negative for shortness of breath. Gastrointestinal: Negative for abdominal pain Musculoskeletal: Bilateral foot and ankle pain Neurological: Negative for headache.  Denies any weakness or numbness. All other ROS negative  ____________________________________________   PHYSICAL EXAM:  Constitutional: Alert and oriented. Well appearing and in no distress. Eyes: Normal exam ENT   Head: Normocephalic and atraumatic.   Mouth/Throat: Mucous membranes are moist. Cardiovascular: Normal rate,  regular rhythm. No murmur Respiratory: Normal respiratory effort without tachypnea nor retractions. Breath sounds are clear  Gastrointestinal: Soft and nontender. No distention.  Musculoskeletal: Patient has a splint placed to the left lower extremity, posterior short leg.  She has a walking/fracture boot on the right lower extremity. Neurologic:  Normal speech and language. No gross focal neurologic deficits Skin:  Skin is warm, dry and intact.  Psychiatric: Mood and affect are normal.  ____________________________________________   RADIOLOGY  Left ankle: x-ray shows significant soft tissue swelling with cortical avulsion to the dorsal anterior talus and dorsal anterior navicular  Right Ankle: Large amount of soft tissue swelling with acute, mildly displaced distal fibular fracture.  Left foot:  Acute, slightly comminuted and displaced fracture involving the base of the fifth metatarsal.  Right Foot:  1. Distal fibular fracture 2. Possible avulsion fracture involving the dorsal, anterior aspect of the talus  ____________________________________________   INITIAL IMPRESSION / ASSESSMENT AND PLAN / ED COURSE  Pertinent labs & imaging results that were available during my care of the patient were reviewed by me and considered in my medical decision making (see chart for details).  Patient presents to the emergency department for bilateral foot and ankle pain.  Reviewing the patient's records and x-rays it appears that she has suffered likely avulsion fractures to the left ankle with fifth metatarsal fracture of the left foot.  Right distal fibula fracture also with avulsion fractures within the ankle.  Given the patient's fractures we will discussed with orthopedic surgery as far as her recommendations.  The patient is nonweightbearing and she lives alone 83 years old the patient will likely require placement.  Discussed the patient with Dr. Harlow Mares.  States patient will likely  need placement to a facility.  We will discussed the patient with medicine to see if they be willing to admit the patient for pain control and placement.  ____________________________________________   FINAL CLINICAL IMPRESSION(S) / ED DIAGNOSES  Bilateral ankle fractures Left 5th metatarsal fracture    Harvest Dark, MD 06/28/17 1949

## 2017-06-28 NOTE — ED Triage Notes (Signed)
Pt to ED with bilateral foot injuries. Pt is not ambulatory and instructed by [facility] ortho to be non weight bearing for the next 6-8 weeks. Pt lives at home alone and was given no wheelchair or method to live throughout the next couple weeks. Pt was also given no pain medication to manage pain at home.  Pt originally injured self this morning after tripping over shoes. Pt broke both feet. No head injury. Only other complaint is bilateral knee pain.

## 2017-06-28 NOTE — H&P (Signed)
[facility] [place] NAME: [name]    MR#:  [id number]  DATE OF BIRTH:  [birth year]  DATE OF ADMISSION:  06/28/2017  PRIMARY CARE PHYSICIAN: Jinny Sanders, MD   REQUESTING/REFERRING PHYSICIAN: Kerman Passey, MD  CHIEF COMPLAINT:   Chief Complaint  Patient presents with  . Foot Pain    HISTORY OF PRESENT ILLNESS:  [name]  is a 83 y.o. female who presents with a chemical fall at home and subsequent left ankle and right fibular fracture.  Patient is nonweightbearing on both feet and will likely need placement for rehab.  Hospitalist were called for admission.  PAST MEDICAL HISTORY:   Past Medical History:  Diagnosis Date  . AAA (abdominal aortic aneurysm) (HCC)    Recurrent; s/p repair, now 3.8 cam; h/o ischemic bowel Oct 29, 2004  . Aortic stenosis    Moderate; Echo 12/09. moderate AS mean 19. AVA 0.88  . Basal cell carcinoma of nose 1980s  . Bruises easily   . CAD (coronary artery disease)    s/p NSTEMI 1/07 with BMS to OM; cath 12/09 due to positive Myoview, LM ok. LAD 40%. LCX 80-90% mid. RCA chronically occluded with R --> L collats  . Carotid artery stenosis    R 60-79% L 0-39% (6/10) - stable  . CRI (chronic renal insufficiency)    Mild, Cr 1.3  . Depression 2004/10/29   With death of spouse  . Full dentures   . GERD (gastroesophageal reflux disease)   . HTN (hypertension)   . Hyperlipidemia    Increased LFTs on Vytorin. Changed to Lipitor.  . Hyperparathyroidism   . Hypothyroidism   . Incontinence   . Myocardial infarction ([place]) 10/29/05  . PAD (peripheral artery disease) (HCC)    s/p iliac stents  . Renal artery stenosis (HCC)    Atrophic L kidney. Moderate blockage on R  . Ventral hernia    Moderate sized  . Vitamin D deficiency     PAST SURGICAL HISTORY:   Past Surgical History:  Procedure Laterality Date  . ABDOMINAL AORTIC ANEURYSM REPAIR  1993/10/29, 07/01/2005  . ANGIOPLASTY  October 30, 2003 - 07/01/2005  .  APPENDECTOMY  1954  . breast biospy  1996  . BREAST SURGERY    . CATARACT EXTRACTION, BILATERAL    . CHOLECYSTECTOMY  07/01/2005  . CHOLECYSTECTOMY  1987  . COLONOSCOPY  07/07/2005   Colonic erosions, ulcers proctitis  . COLONOSCOPY W/ ENDOSCOPIC [place]  07/01/2005   [place] kidney diffuse atrophy  . CORONARY STENT PLACEMENT    . CORONARY STENT PLACEMENT  07/2005   MI   . HERNIA REPAIR  1997  . HOSP  04/2005   ? abd. prob. mesenteric ischemia  . HOSP  01/2006   Hosp for reflux (chest pain)  . ILIAC ARTERY STENT  October 30, 2003 - 07/01/2005   Stent placement, both legs - PVD in arteries  . NSTEMI  08/13/2005   1/07 s/p stent in circumflex  . TONSILLECTOMY  29-Oct-1941  . UPPER GASTROINTESTINAL ENDOSCOPY  07/07/2005   Nml    SOCIAL HISTORY:   Social History   Tobacco Use  . Smoking status: Former Smoker    Last attempt to quit: 07/12/2005    Years since quitting: 11.9  . Smokeless tobacco: Never Used  . Tobacco comment: quit in October 30, 1995  Substance Use Topics  . Alcohol use: No    FAMILY HISTORY:   Family History  Problem Relation Age of Onset  .  Heart attack Mother        MI  . Cancer Mother   . Heart disease Mother        heart attack  . Stroke Mother   . Cancer Father        throat  . Heart attack Father        MI  . Throat cancer Father   . Heart disease Father        heart attack   . Stroke Father   . Cancer Sister        breast  . Breast cancer Sister   . Hyperlipidemia Sister   . Hyperlipidemia Sister   . Hypertension Sister   . Hyperlipidemia Son   . Colon cancer Neg Hx     DRUG ALLERGIES:   Allergies  Allergen Reactions  . Fish Oil Other (See Comments)    Severe indigestion  . Naproxen Nausea And Vomiting  . Tramadol Nausea And Vomiting  . Codeine Nausea And Vomiting and Rash  . Morphine Sulfate Nausea And Vomiting and Rash  . Sulfonamide Derivatives Nausea And Vomiting and Rash    MEDICATIONS AT HOME:   Prior to Admission medications   Medication Sig  Start Date End Date Taking? Authorizing Provider  aspirin EC 81 MG tablet Take 81 mg by mouth at bedtime.   Yes [provider]  atorvastatin (LIPITOR) 80 MG tablet Take 1 tablet (80 mg total) by mouth daily. 07/19/16  Yes Bedsole, Amy E, MD  Cholecalciferol 1000 UNITS capsule Take 3,000 Units by mouth daily.   Yes [provider]  clopidogrel (PLAVIX) 75 MG tablet Take 1 tablet (75 mg total) by mouth daily. 07/20/16  Yes Bedsole, Amy E, MD  colestipol (COLESTID) 1 g tablet Take 2 tablets (2 g total) by mouth 2 (two) times daily. 07/19/16  Yes Bedsole, Amy E, MD  estradiol (ESTRACE VAGINAL) 0.1 MG/GM vaginal cream Place 2 g vaginally 2 (two) times a week. Thursday and Sunday   Yes [provider]  Febuxostat (ULORIC) 80 MG TABS Take 80 mg by mouth daily.   Yes [provider]  isosorbide mononitrate (IMDUR) 30 MG 24 hr tablet TAKE 1 TABLET TWICE A DAY 06/13/17  Yes Gollan, Kathlene November, MD  levothyroxine (SYNTHROID, LEVOTHROID) 112 MCG tablet TAKE 1 TABLET DAILY 05/10/17  Yes Bedsole, Amy E, MD  metoprolol tartrate (LOPRESSOR) 25 MG tablet TAKE ONE-HALF (1/2) TABLET TWICE A DAY 03/28/17  Yes Gollan, Kathlene November, MD  sertraline (ZOLOFT) 50 MG tablet TAKE 1 TABLET (50 MG TOTAL) BY MOUTH DAILY. 05/24/17  Yes Bedsole, Amy E, MD  acetaminophen (TYLENOL) 500 MG tablet Take 1,000 mg by mouth 2 (two) times daily as needed for pain.    [provider]    REVIEW OF SYSTEMS:  Review of Systems  Constitutional: Negative for chills, fever, malaise/fatigue and weight loss.  HENT: Negative for ear pain, hearing loss and tinnitus.   Eyes: Negative for blurred vision, double vision, pain and redness.  Respiratory: Negative for cough, hemoptysis and shortness of breath.   Cardiovascular: Negative for chest pain, palpitations, orthopnea and leg swelling.  Gastrointestinal: Negative for abdominal pain, constipation, diarrhea, nausea and vomiting.  Genitourinary: Negative for  dysuria, frequency and hematuria.  Musculoskeletal: Positive for falls and joint pain (Left ankle and right lower extremity). Negative for back pain and neck pain.  Skin:       No acne, rash, or lesions  Neurological: Negative for dizziness, tremors, focal weakness and  weakness.  Endo/Heme/Allergies: Negative for polydipsia. Does not bruise/bleed easily.  Psychiatric/Behavioral: Negative for depression. The patient is not nervous/anxious and does not have insomnia.      VITAL SIGNS:   Vitals:   06/28/17 2005 06/28/17 2015  BP: (!) 174/90   Pulse: (!) 56   Resp: 16   Temp:  98 F (36.7 C)  TempSrc:  Oral  SpO2: 94%    Wt Readings from Last 3 Encounters:  05/24/17 84.5 kg (186 lb 4 oz)  02/15/17 85.5 kg (188 lb 8 oz)  01/19/17 82.6 kg (182 lb)    PHYSICAL EXAMINATION:  Physical Exam  Vitals reviewed. Constitutional: She is oriented to person, place, and time. She appears well-developed and well-nourished. No distress.  HENT:  Head: Normocephalic and atraumatic.  Mouth/Throat: Oropharynx is clear and moist.  Eyes: Conjunctivae and EOM are normal. Pupils are equal, round, and reactive to light. No scleral icterus.  Neck: Normal range of motion. Neck supple. No JVD present. No thyromegaly present.  Cardiovascular: Normal rate, regular rhythm and intact distal pulses. Exam reveals no gallop and no friction rub.  No murmur heard. Respiratory: Effort normal and breath sounds normal. No respiratory distress. She has no wheezes. She has no rales.  GI: Soft. Bowel sounds are normal. She exhibits no distension. There is no tenderness.  Musculoskeletal: Normal range of motion. She exhibits tenderness (Left ankle and right lower extremity). She exhibits no edema.  No arthritis, no gout  Lymphadenopathy:    She has no cervical adenopathy.  Neurological: She is alert and oriented to person, place, and time. No cranial nerve deficit.  No dysarthria, no aphasia  Skin: Skin is warm and  dry. No rash noted. No erythema.  Psychiatric: She has a normal mood and affect. Her behavior is normal. Judgment and thought content normal.    LABORATORY PANEL:   CBC Recent Labs  Lab 06/28/17 2000  WBC 11.0  HGB 13.7  HCT 40.6  PLT 142*   ------------------------------------------------------------------------------------------------------------------  Chemistries  Recent Labs  Lab 06/28/17 2000  NA 140  K 3.9  CL 104  CO2 26  GLUCOSE 98  BUN 24*  CREATININE 1.42*  CALCIUM 9.4   ------------------------------------------------------------------------------------------------------------------  Cardiac Enzymes No results for input(s): TROPONINI in the last 168 hours. ------------------------------------------------------------------------------------------------------------------  RADIOLOGY:  Dg Ankle Complete Left  Result Date: 06/28/2017 CLINICAL DATA:  Fall with foot and ankle pain EXAM: LEFT ANKLE COMPLETE - 3+ VIEW COMPARISON:  None. FINDINGS: Lateral soft tissue swelling. Possible avulsion off the dorsal anterior cortex of the talus. Large amount of dorsal soft tissue swelling. There may be additional small avulsion off the anterior, dorsal navicular. IMPRESSION: Large amount of dorsal soft tissue swelling and lateral soft tissue swelling. Possible cortical avulsion injuries involving the dorsal anterior talus and dorsal anterior navicular. Electronically Signed   By: Donavan Foil M.D.   On: 06/28/2017 17:19   Dg Ankle Complete Right  Result Date: 06/28/2017 CLINICAL DATA:  Fall with foot and ankle pain and swelling EXAM: RIGHT ANKLE - COMPLETE 3+ VIEW COMPARISON:  None. FINDINGS: Large amount of lateral soft tissue swelling. Acute fracture involving the distal metaphysis and shaft of the fibula with about 1/4 shaft diameter of lateral displacement of distal fracture fragment. Ankle mortise is symmetric. IMPRESSION: Large amount of soft tissue swelling with acute,  mildly displaced distal fibular fracture. Electronically Signed   By: Donavan Foil M.D.   On: 06/28/2017 17:14   Dg Foot Complete Left  Result Date:  06/28/2017 CLINICAL DATA:  Fall with pain and swelling EXAM: LEFT FOOT - COMPLETE 3+ VIEW COMPARISON:  None. FINDINGS: No subluxation is evident. Acute, mildly comminuted fracture involving the base of the fifth metatarsal with about 3 mm separation of fracture fragment from parent bone. IMPRESSION: Acute, slightly comminuted and displaced fracture involving the base of the fifth metatarsal. Electronically Signed   By: Donavan Foil M.D.   On: 06/28/2017 17:17   Dg Foot Complete Right  Result Date: 06/28/2017 CLINICAL DATA:  Fall with foot pain and swelling EXAM: RIGHT FOOT COMPLETE - 3+ VIEW COMPARISON:  None. FINDINGS: Partially visualized distal fibular fracture. Possible small avulsion fracture at the dorsal anterior aspect of the talus bone. No malalignment. Degenerative changes at the first MTP joint IMPRESSION: 1. Distal fibular fracture 2. Possible avulsion fracture involving the dorsal, anterior aspect of the talus Electronically Signed   By: Donavan Foil M.D.   On: 06/28/2017 17:15    EKG:   Orders placed or performed in visit on 01/13/17  . EKG 12-Lead    IMPRESSION AND PLAN:  Principal Problem:   Closed fracture of right distal fibula -right lower extremity is already placed in the immobilizer boot, orthopedics consult Active Problems:   Closed left ankle fracture -ankle was too swollen for boot, it is splinted and wrapped, orthopedics consult   Essential hypertension -continue home meds   Coronary atherosclerosis -continue home medications   Hypothyroidism -home dose thyroid replacement   GERD -home dose PPI   CKD (chronic kidney disease) stage 3, GFR 30-59 ml/min (HCC) -avoid nephrotoxins and monitor  All the records are reviewed and case discussed with ED provider. Management plans discussed with the patient and/or  family.  DVT PROPHYLAXIS: SubQ heparin  GI PROPHYLAXIS: PPI  ADMISSION STATUS: Inpatient  CODE STATUS: Full Code Status History    Date Active Date Inactive Code Status Order ID Comments User Context   05/30/2014 07:24 06/02/2014 17:21 Full Code [id number]  Louellen Molder, MD Inpatient      TOTAL TIME TAKING CARE OF THIS PATIENT: 45 minutes.   Alanta Scobey [place] 06/28/2017, 9:19 PM  [facility]  Office  [phone]  CC: Primary care physician; Jinny Sanders, MD  Note:  This document was prepared using Dragon voice recognition software and may include unintentional dictation errors.

## 2017-06-29 ENCOUNTER — Other Ambulatory Visit: Payer: Self-pay

## 2017-06-29 LAB — BASIC METABOLIC PANEL
Anion gap: 8 (ref 5–15)
BUN: 24 mg/dL — ABNORMAL HIGH (ref 6–20)
CHLORIDE: 104 mmol/L (ref 101–111)
CO2: 27 mmol/L (ref 22–32)
Calcium: 9 mg/dL (ref 8.9–10.3)
Creatinine, Ser: 1.42 mg/dL — ABNORMAL HIGH (ref 0.44–1.00)
GFR calc non Af Amer: 32 mL/min — ABNORMAL LOW (ref >60)
GFR, EST AFRICAN AMERICAN: 37 mL/min — AB (ref >60)
Glucose, Bld: 102 mg/dL — ABNORMAL HIGH (ref 65–99)
POTASSIUM: 3.8 mmol/L (ref 3.5–5.1)
SODIUM: 139 mmol/L (ref 135–145)

## 2017-06-29 LAB — CBC
HCT: 37.5 % (ref 35.0–47.0)
HEMOGLOBIN: 12.6 g/dL (ref 12.0–16.0)
MCH: 29.8 pg (ref 26.0–34.0)
MCHC: 33.7 g/dL (ref 32.0–36.0)
MCV: 88.4 fL (ref 80.0–100.0)
PLATELETS: 125 10*3/uL — AB (ref 150–440)
RBC: 4.24 MIL/uL (ref 3.80–5.20)
RDW: 15.9 % — AB (ref 11.5–14.5)
WBC: 9.2 10*3/uL (ref 3.6–11.0)

## 2017-06-29 MED ORDER — TRAMADOL HCL 50 MG PO TABS: 50.0000 mg | ORAL_TABLET | Freq: Four times a day (QID) | ORAL | Status: DC | PRN

## 2017-06-29 NOTE — Consult Note (Signed)
ORTHOPAEDIC CONSULTATION  REQUESTING PHYSICIAN: Henreitta Leber, MD  Chief Complaint: bilateral foot and ankle pain  HPI: [name] is a 83 y.o. female who complains of bilateral foot and ankle pain after suffering a fall at home. She has no other complaints. No numbness or tingling. No constitutional symptoms. She is in the room with her family.  Past Medical History:  Diagnosis Date  . AAA (abdominal aortic aneurysm) (HCC)    Recurrent; s/p repair, now 3.8 cam; h/o ischemic bowel 2004-09-19  . Aortic stenosis    Moderate; Echo 12/09. moderate AS mean 19. AVA 0.88  . Basal cell carcinoma of nose 1980s  . Bruises easily   . CAD (coronary artery disease)    s/p NSTEMI 1/07 with BMS to OM; cath 12/09 due to positive Myoview, LM ok. LAD 40%. LCX 80-90% mid. RCA chronically occluded with R --> L collats  . Carotid artery stenosis    R 60-79% L 0-39% (6/10) - stable  . CRI (chronic renal insufficiency)    Mild, Cr 1.3  . Depression 09/19/2004   With death of spouse  . Full dentures   . GERD (gastroesophageal reflux disease)   . HTN (hypertension)   . Hyperlipidemia    Increased LFTs on Vytorin. Changed to Lipitor.  . Hyperparathyroidism   . Hypothyroidism   . Incontinence   . Myocardial infarction ([place]) September 19, 2005  . PAD (peripheral artery disease) (HCC)    s/p iliac stents  . Renal artery stenosis (HCC)    Atrophic L kidney. Moderate blockage on R  . Ventral hernia    Moderate sized  . Vitamin D deficiency    Past Surgical History:  Procedure Laterality Date  . ABDOMINAL AORTIC ANEURYSM REPAIR  1993-09-19, 07/01/2005  . ANGIOPLASTY  Sep 20, 2003 - 07/01/2005  . APPENDECTOMY  1954  . breast biospy  1996  . BREAST SURGERY    . CATARACT EXTRACTION, BILATERAL    . CHOLECYSTECTOMY  07/01/2005  . CHOLECYSTECTOMY  1987  . COLONOSCOPY  07/07/2005   Colonic erosions, ulcers proctitis  . COLONOSCOPY W/ ENDOSCOPIC [place]  07/01/2005   [place] kidney diffuse atrophy  . CORONARY STENT PLACEMENT    .  CORONARY STENT PLACEMENT  07/2005   MI   . HERNIA REPAIR  1997  . HOSP  04/2005   ? abd. prob. mesenteric ischemia  . HOSP  01/2006   Hosp for reflux (chest pain)  . ILIAC ARTERY STENT  20-Sep-2003 - 07/01/2005   Stent placement, both legs - PVD in arteries  . NSTEMI  08/13/2005   1/07 s/p stent in circumflex  . TONSILLECTOMY  Sep 19, 1941  . UPPER GASTROINTESTINAL ENDOSCOPY  07/07/2005   Nml   Social History   Socioeconomic History  . Marital status: Widowed    Spouse name: None  . Number of children: None  . Years of education: None  . Highest education level: None  Social Needs  . Financial resource strain: None  . Food insecurity - worry: None  . Food insecurity - inability: None  . Transportation needs - medical: None  . Transportation needs - non-medical: None  Occupational History  . None  Tobacco Use  . Smoking status: Former Smoker    Last attempt to quit: 07/12/2005    Years since quitting: 11.9  . Smokeless tobacco: Never Used  . Tobacco comment: quit in 09/20/1995  Substance and Sexual Activity  . Alcohol use: No  . Drug use: No  . Sexual activity: None  Other Topics Concern  . None  Social History Narrative   Husband deceased May 06, 2005 from multiple myeloma, lives alone near sister..    Has living will, HCPOA: daughter: [name]   Full code. Has lifealert. (Reviewed 2014)         Family History  Problem Relation Age of Onset  . Heart attack Mother        MI  . Cancer Mother   . Heart disease Mother        heart attack  . Stroke Mother   . Cancer Father        throat  . Heart attack Father        MI  . Throat cancer Father   . Heart disease Father        heart attack   . Stroke Father   . Cancer Sister        breast  . Breast cancer Sister   . Hyperlipidemia Sister   . Hyperlipidemia Sister   . Hypertension Sister   . Hyperlipidemia Son   . Colon cancer Neg Hx    Allergies  Allergen Reactions  . Fish Oil Other (See Comments)    Severe indigestion  .  Naproxen Nausea And Vomiting  . Tramadol Nausea And Vomiting  . Codeine Nausea And Vomiting and Rash  . Morphine Sulfate Nausea And Vomiting and Rash  . Sulfonamide Derivatives Nausea And Vomiting and Rash   Prior to Admission medications   Medication Sig Start Date End Date Taking? Authorizing Provider  aspirin EC 81 MG tablet Take 81 mg by mouth at bedtime.   Yes [provider]  atorvastatin (LIPITOR) 80 MG tablet Take 1 tablet (80 mg total) by mouth daily. 07/19/16  Yes Bedsole, Amy E, MD  Cholecalciferol 1000 UNITS capsule Take 3,000 Units by mouth daily.   Yes [provider]  clopidogrel (PLAVIX) 75 MG tablet Take 1 tablet (75 mg total) by mouth daily. 07/20/16  Yes Bedsole, Amy E, MD  colestipol (COLESTID) 1 g tablet Take 2 tablets (2 g total) by mouth 2 (two) times daily. 07/19/16  Yes Bedsole, Amy E, MD  estradiol (ESTRACE VAGINAL) 0.1 MG/GM vaginal cream Place 2 g vaginally 2 (two) times a week. Thursday and Sunday   Yes [provider]  Febuxostat (ULORIC) 80 MG TABS Take 80 mg by mouth daily.   Yes [provider]  isosorbide mononitrate (IMDUR) 30 MG 24 hr tablet TAKE 1 TABLET TWICE A DAY 06/13/17  Yes Gollan, Kathlene November, MD  levothyroxine (SYNTHROID, LEVOTHROID) 112 MCG tablet TAKE 1 TABLET DAILY 05/10/17  Yes Bedsole, Amy E, MD  metoprolol tartrate (LOPRESSOR) 25 MG tablet TAKE ONE-HALF (1/2) TABLET TWICE A DAY 03/28/17  Yes Gollan, Kathlene November, MD  sertraline (ZOLOFT) 50 MG tablet TAKE 1 TABLET (50 MG TOTAL) BY MOUTH DAILY. 05/24/17  Yes Bedsole, Amy E, MD  acetaminophen (TYLENOL) 500 MG tablet Take 1,000 mg by mouth 2 (two) times daily as needed for pain.    [provider]   Dg Ankle Complete Left  Result Date: 06/28/2017 CLINICAL DATA:  Fall with foot and ankle pain EXAM: LEFT ANKLE COMPLETE - 3+ VIEW COMPARISON:  None. FINDINGS: Lateral soft tissue swelling. Possible avulsion off the dorsal anterior cortex of the talus. Large amount of  dorsal soft tissue swelling. There may be additional small avulsion off the anterior, dorsal navicular. IMPRESSION: Large amount of dorsal soft tissue swelling and lateral soft tissue swelling. Possible cortical avulsion injuries  involving the dorsal anterior talus and dorsal anterior navicular. Electronically Signed   By: Donavan Foil M.D.   On: 06/28/2017 17:19   Dg Ankle Complete Right  Result Date: 06/28/2017 CLINICAL DATA:  Fall with foot and ankle pain and swelling EXAM: RIGHT ANKLE - COMPLETE 3+ VIEW COMPARISON:  None. FINDINGS: Large amount of lateral soft tissue swelling. Acute fracture involving the distal metaphysis and shaft of the fibula with about 1/4 shaft diameter of lateral displacement of distal fracture fragment. Ankle mortise is symmetric. IMPRESSION: Large amount of soft tissue swelling with acute, mildly displaced distal fibular fracture. Electronically Signed   By: Donavan Foil M.D.   On: 06/28/2017 17:14   Dg Foot Complete Left  Result Date: 06/28/2017 CLINICAL DATA:  Fall with pain and swelling EXAM: LEFT FOOT - COMPLETE 3+ VIEW COMPARISON:  None. FINDINGS: No subluxation is evident. Acute, mildly comminuted fracture involving the base of the fifth metatarsal with about 3 mm separation of fracture fragment from parent bone. IMPRESSION: Acute, slightly comminuted and displaced fracture involving the base of the fifth metatarsal. Electronically Signed   By: Donavan Foil M.D.   On: 06/28/2017 17:17   Dg Foot Complete Right  Result Date: 06/28/2017 CLINICAL DATA:  Fall with foot pain and swelling EXAM: RIGHT FOOT COMPLETE - 3+ VIEW COMPARISON:  None. FINDINGS: Partially visualized distal fibular fracture. Possible small avulsion fracture at the dorsal anterior aspect of the talus bone. No malalignment. Degenerative changes at the first MTP joint IMPRESSION: 1. Distal fibular fracture 2. Possible avulsion fracture involving the dorsal, anterior aspect of the talus Electronically  Signed   By: Donavan Foil M.D.   On: 06/28/2017 17:15    Positive ROS: All other systems have been reviewed and were otherwise negative with the exception of those mentioned in the HPI and as above.  Physical Exam: General: Alert, no acute distress Cardiovascular: No pedal edema Respiratory: No cyanosis, no use of accessory musculature GI: No organomegaly, abdomen is soft and non-tender Skin: No lesions in the area of chief complaint Neurologic: Sensation intact distally Psychiatric: Patient is competent for consent with normal mood and affect Lymphatic: No axillary or cervical lymphadenopathy  MUSCULOSKELETAL: BUE FROM and non-tender LLE:  Good cap refill, motor and sensory intact, lateral foot tenderness RLE:  +ecchymosis and swelling lateral and dorsal, NO medial tenderness, +DF/PF/EHL  Assessment: Right lateral malleolus fracture with intact mortise Left base of the 5th metatarsal avulsion fracture  Plan: Patient will be unable to bear weight for about 4 weeks based on her symptoms. She may follow-up in my office in one week for repeat check and x-ray. Recommend short term SNF given her limitations. All of their questions are answered.    Lovell Sheehan, MD    06/29/2017 5:18 PM

## 2017-06-29 NOTE — Telephone Encounter (Signed)
Per chart review tab pt admitted to [facility] on 06/28/17.

## 2017-06-29 NOTE — Progress Notes (Signed)
[place] [facility] [place] NAME: [name]    MR#:  [id number]  DATE OF BIRTH:  December 27, 1927  SUBJECTIVE:   Patient here after mechanical fall and noted to have a right fibular fracture. Awaiting orthopedic evaluation. Pain is well-controlled on oral meds. Patient's family is at bedside. This was a mechanical fall. Patient had no prodromal symptoms prior to her fall.  REVIEW OF SYSTEMS:    Review of Systems  Constitutional: Negative for chills and fever.  HENT: Negative for congestion and tinnitus.   Eyes: Negative for blurred vision and double vision.  Respiratory: Negative for cough, shortness of breath and wheezing.   Cardiovascular: Negative for chest pain, orthopnea and PND.  Gastrointestinal: Negative for abdominal pain, diarrhea, nausea and vomiting.  Genitourinary: Negative for dysuria and hematuria.  Musculoskeletal: Positive for falls.  Neurological: Negative for dizziness, sensory change and focal weakness.  All other systems reviewed and are negative.   Nutrition: Heart Healthy Tolerating Diet: Yes Tolerating PT: Await Eval.   DRUG ALLERGIES:   Allergies  Allergen Reactions  . Fish Oil Other (See Comments)    Severe indigestion  . Naproxen Nausea And Vomiting  . Tramadol Nausea And Vomiting  . Codeine Nausea And Vomiting and Rash  . Morphine Sulfate Nausea And Vomiting and Rash  . Sulfonamide Derivatives Nausea And Vomiting and Rash    VITALS:  Blood pressure 140/83, pulse 73, temperature 97.8 F (36.6 C), temperature source Axillary, resp. rate 16, height 5\' 6"  (1.676 m), weight 89.2 kg (196 lb 11.2 oz), SpO2 93 %.  PHYSICAL EXAMINATION:   Physical Exam  GENERAL:  83 y.o.-year-old patient lying in bed in no acute distress.  EYES: Pupils equal, round, reactive to light and accommodation. No scleral icterus. Extraocular muscles intact.  HEENT: Head atraumatic, normocephalic. Oropharynx and nasopharynx clear.  NECK:   Supple, no jugular venous distention. No thyroid enlargement, no tenderness.  LUNGS: Normal breath sounds bilaterally, no wheezing, rales, rhonchi. No use of accessory muscles of respiration.  CARDIOVASCULAR: S1, S2 normal. No murmurs, rubs, or gallops.  ABDOMEN: Soft, nontender, nondistended. Bowel sounds present. No organomegaly or mass.  EXTREMITIES: No cyanosis, clubbing or edema b/l.  RLE in a splint NEUROLOGIC: Cranial nerves II through XII are intact. No focal Motor or sensory deficits b/l.  Globally weak.  PSYCHIATRIC: The patient is alert and oriented x 3.  SKIN: No obvious rash, lesion, or ulcer.    LABORATORY PANEL:   CBC Recent Labs  Lab 06/29/17 0332  WBC 9.2  HGB 12.6  HCT 37.5  PLT 125*   ------------------------------------------------------------------------------------------------------------------  Chemistries  Recent Labs  Lab 06/29/17 0332  NA 139  K 3.8  CL 104  CO2 27  GLUCOSE 102*  BUN 24*  CREATININE 1.42*  CALCIUM 9.0   ------------------------------------------------------------------------------------------------------------------  Cardiac Enzymes No results for input(s): TROPONINI in the last 168 hours. ------------------------------------------------------------------------------------------------------------------  RADIOLOGY:  Dg Ankle Complete Left  Result Date: 06/28/2017 CLINICAL DATA:  Fall with foot and ankle pain EXAM: LEFT ANKLE COMPLETE - 3+ VIEW COMPARISON:  None. FINDINGS: Lateral soft tissue swelling. Possible avulsion off the dorsal anterior cortex of the talus. Large amount of dorsal soft tissue swelling. There may be additional small avulsion off the anterior, dorsal navicular. IMPRESSION: Large amount of dorsal soft tissue swelling and lateral soft tissue swelling. Possible cortical avulsion injuries involving the dorsal anterior talus and dorsal anterior navicular. Electronically Signed   By: Donavan Foil M.D.   On:  06/28/2017 17:19   Dg Ankle Complete Right  Result Date: 06/28/2017 CLINICAL DATA:  Fall with foot and ankle pain and swelling EXAM: RIGHT ANKLE - COMPLETE 3+ VIEW COMPARISON:  None. FINDINGS: Large amount of lateral soft tissue swelling. Acute fracture involving the distal metaphysis and shaft of the fibula with about 1/4 shaft diameter of lateral displacement of distal fracture fragment. Ankle mortise is symmetric. IMPRESSION: Large amount of soft tissue swelling with acute, mildly displaced distal fibular fracture. Electronically Signed   By: Donavan Foil M.D.   On: 06/28/2017 17:14   Dg Foot Complete Left  Result Date: 06/28/2017 CLINICAL DATA:  Fall with pain and swelling EXAM: LEFT FOOT - COMPLETE 3+ VIEW COMPARISON:  None. FINDINGS: No subluxation is evident. Acute, mildly comminuted fracture involving the base of the fifth metatarsal with about 3 mm separation of fracture fragment from parent bone. IMPRESSION: Acute, slightly comminuted and displaced fracture involving the base of the fifth metatarsal. Electronically Signed   By: Donavan Foil M.D.   On: 06/28/2017 17:17   Dg Foot Complete Right  Result Date: 06/28/2017 CLINICAL DATA:  Fall with foot pain and swelling EXAM: RIGHT FOOT COMPLETE - 3+ VIEW COMPARISON:  None. FINDINGS: Partially visualized distal fibular fracture. Possible small avulsion fracture at the dorsal anterior aspect of the talus bone. No malalignment. Degenerative changes at the first MTP joint IMPRESSION: 1. Distal fibular fracture 2. Possible avulsion fracture involving the dorsal, anterior aspect of the talus Electronically Signed   By: Donavan Foil M.D.   On: 06/28/2017 17:15     ASSESSMENT AND PLAN:   83 year old female with past medical history of gout, hypertension, hyperlipidemia, hypothyroidism, previous history of MI, coronary artery disease, chronic renal insufficiency, aortic stenosis, abdominal aortic aneurysm who presented to the hospital after  mechanical fall and noted to have a right fibular fracture.  1. Status post fall and right fibular fracture-this is secondary to mechanical fall. -Await orthopedic evaluation, await physical therapy evaluation. -Continue Tylenol, tramadol for pain control.  2. Essential hypertension-continue metoprolol, Imdur. Continue IV hydralazine.  3. History of gout-no acute attack. Continue Uloric  4. Hypothyroidism-continue Synthroid.  5. Hyperlipidemia-continue atorvastatin.  6. History of coronary disease with previous MI-no acute chest pain. Continue aspirin, atorvastatin, Plavix, metoprolol, Imdur.   All the records are reviewed and case discussed with Care Management/Social Worker. Management plans discussed with the patient, family and they are in agreement.  CODE STATUS: Full code  DVT Prophylaxis: Heparin subcutaneous  TOTAL TIME TAKING CARE OF THIS PATIENT: 30 minutes.   POSSIBLE D/C IN 1-2 DAYS, DEPENDING ON CLINICAL CONDITION.   Henreitta Leber M.D on 06/29/2017 at 4:04 PM  Between 7am to 6pm - Pager - [phone]  After 6pm go to [email or web address] - Proofreader  [facility]  Office  [phone]  CC: Primary care physician; Jinny Sanders, MD

## 2017-06-30 ENCOUNTER — Non-Acute Institutional Stay (SKILLED_NURSING_FACILITY): Payer: Medicare Other | Admitting: Gerontology

## 2017-06-30 ENCOUNTER — Encounter
Admission: RE | Admit: 2017-06-30 | Discharge: 2017-06-30 | Disposition: A | Discharge status: Home / Self Care | Payer: Medicare Other | Source: Ambulatory Visit | Attending: Internal Medicine | Admitting: Internal Medicine

## 2017-06-30 DIAGNOSIS — S82892D Other fracture of left lower leg, subsequent encounter for closed fracture with routine healing: Secondary | ICD-10-CM

## 2017-06-30 DIAGNOSIS — S82831D Other fracture of upper and lower end of right fibula, subsequent encounter for closed fracture with routine healing: Secondary | ICD-10-CM

## 2017-06-30 MED ORDER — TRAMADOL HCL 50 MG PO TABS: 50.0000 mg | ORAL_TABLET | Freq: Four times a day (QID) | ORAL | 0 refills | Status: DC | PRN

## 2017-06-30 NOTE — Discharge Summary (Addendum)
[facility] NAME: [name]    MR#:  [id number]  DATE OF BIRTH:  07-18-1927  DATE OF ADMISSION:  06/28/2017 ADMITTING PHYSICIAN: Lance Coon, MD  DATE OF DISCHARGE: 06/30/2017  PRIMARY CARE PHYSICIAN: Jinny Sanders, MD    ADMISSION DIAGNOSIS:  Closed fracture of left ankle, initial encounter [S82.892A] Nondisplaced fracture of fifth metatarsal bone, left foot, initial encounter for closed fracture [S92.355A] Other closed fracture of distal end of right fibula, initial encounter [S82.831A]  DISCHARGE DIAGNOSIS:  Principal Problem:   Closed fracture of right distal fibula Active Problems:   Hypothyroidism   Essential hypertension   Coronary atherosclerosis   GERD   CKD (chronic kidney disease) stage 3, GFR 30-59 ml/min (HCC)   Closed left ankle fracture   SECONDARY DIAGNOSIS:   Past Medical History:  Diagnosis Date  . AAA (abdominal aortic aneurysm) (HCC)    Recurrent; s/p repair, now 3.8 cam; h/o ischemic bowel 10/19/2004  . Aortic stenosis    Moderate; Echo 12/09. moderate AS mean 19. AVA 0.88  . Basal cell carcinoma of nose 1980s  . Bruises easily   . CAD (coronary artery disease)    s/p NSTEMI 1/07 with BMS to OM; cath 12/09 due to positive Myoview, LM ok. LAD 40%. LCX 80-90% mid. RCA chronically occluded with R --> L collats  . Carotid artery stenosis    R 60-79% L 0-39% (6/10) - stable  . CRI (chronic renal insufficiency)    Mild, Cr 1.3  . Depression 19-Oct-2004   With death of spouse  . Full dentures   . GERD (gastroesophageal reflux disease)   . HTN (hypertension)   . Hyperlipidemia    Increased LFTs on Vytorin. Changed to Lipitor.  . Hyperparathyroidism   . Hypothyroidism   . Incontinence   . Myocardial infarction ([place]) Oct 19, 2005  . PAD (peripheral artery disease) (HCC)    s/p iliac stents  . Renal artery stenosis (HCC)    Atrophic L kidney. Moderate blockage on R  . Ventral hernia    Moderate sized  .  Vitamin D deficiency     HOSPITAL COURSE:   83 year old female with past medical history of gout, hypertension, hyperlipidemia, hypothyroidism, previous history of MI, coronary artery disease, chronic renal insufficiency, aortic stenosis, abdominal aortic aneurysm who presented to the hospital after mechanical fall and noted to have a right fibular fracture.  1. Status post fall and right fibular fracture-this is secondary to mechanical fall. -Patient's pain is well-controlled with some Tylenol. Patient was seen by orthopedics and he recommended no weightbearing on that right leg and follow up with them in 1 week for an x-ray. -Patient was seen by physical therapy and recommended short-term rehabilitation which is where she is presently being discharged.  2. Essential hypertension- pt. Will continue metoprolol, Imdur.   3. History of gout-no acute attack. Pt. Will Continue Uloric  4. Hypothyroidism- pt. Will continue Synthroid.  5. Hyperlipidemia- pt. Will continue atorvastatin.  6. History of coronary disease with previous MI- pt. Has no acute chest pain. She will Continue aspirin, atorvastatin, Plavix, metoprolol, Imdur.   DISCHARGE CONDITIONS:   Stable.   CONSULTS OBTAINED:  Treatment Team:  Lovell Sheehan, MD  DRUG ALLERGIES:   Allergies  Allergen Reactions  . Fish Oil Other (See Comments)    Severe indigestion  . Naproxen Nausea And Vomiting  . Tramadol Nausea And Vomiting  . Codeine Nausea And Vomiting and Rash  . Morphine  Sulfate Nausea And Vomiting and Rash  . Sulfonamide Derivatives Nausea And Vomiting and Rash    DISCHARGE MEDICATIONS:   Allergies as of 06/30/2017      Reactions   Fish Oil Other (See Comments)   Severe indigestion   Naproxen Nausea And Vomiting   Tramadol Nausea And Vomiting   Codeine Nausea And Vomiting, Rash   Morphine Sulfate Nausea And Vomiting, Rash   Sulfonamide Derivatives Nausea And Vomiting, Rash      Medication List     TAKE these medications   acetaminophen 500 MG tablet Commonly known as:  TYLENOL Take 1,000 mg by mouth 2 (two) times daily as needed for pain.   aspirin EC 81 MG tablet Take 81 mg by mouth at bedtime.   atorvastatin 80 MG tablet Commonly known as:  LIPITOR Take 1 tablet (80 mg total) by mouth daily.   Cholecalciferol 1000 units capsule Take 3,000 Units by mouth daily.   clopidogrel 75 MG tablet Commonly known as:  PLAVIX Take 1 tablet (75 mg total) by mouth daily.   colestipol 1 g tablet Commonly known as:  COLESTID Take 2 tablets (2 g total) by mouth 2 (two) times daily.   ESTRACE VAGINAL 0.1 MG/GM vaginal cream Generic drug:  estradiol Place 2 g vaginally 2 (two) times a week. Thursday and Sunday   isosorbide mononitrate 30 MG 24 hr tablet Commonly known as:  IMDUR TAKE 1 TABLET TWICE A DAY   levothyroxine 112 MCG tablet Commonly known as:  SYNTHROID, LEVOTHROID TAKE 1 TABLET DAILY   metoprolol tartrate 25 MG tablet Commonly known as:  LOPRESSOR TAKE ONE-HALF (1/2) TABLET TWICE A DAY   sertraline 50 MG tablet Commonly known as:  ZOLOFT TAKE 1 TABLET (50 MG TOTAL) BY MOUTH DAILY.   ULORIC 80 MG Tabs Generic drug:  Febuxostat Take 80 mg by mouth daily.         DISCHARGE INSTRUCTIONS:   DIET:  Cardiac diet  DISCHARGE CONDITION:  Stable  ACTIVITY:  Activity as tolerated  OXYGEN:  Home Oxygen: No.   Oxygen Delivery: room air  DISCHARGE LOCATION:  nursing home   If you experience worsening of your admission symptoms, develop shortness of breath, life threatening emergency, suicidal or homicidal thoughts you must seek medical attention immediately by calling 911 or calling your MD immediately  if symptoms less severe.  You Must read complete instructions/literature along with all the possible adverse reactions/side effects for all the Medicines you take and that have been prescribed to you. Take any new Medicines after you have completely  understood and accpet all the possible adverse reactions/side effects.   Please note  You were cared for by a hospitalist during your hospital stay. If you have any questions about your discharge medications or the care you received while you were in the hospital after you are discharged, you can call the unit and asked to speak with the hospitalist on call if the hospitalist that took care of you is not available. Once you are discharged, your primary care physician will handle any further medical issues. Please note that NO REFILLS for any discharge medications will be authorized once you are discharged, as it is imperative that you return to your primary care physician (or establish a relationship with a primary care physician if you do not have one) for your aftercare needs so that they can reassess your need for medications and monitor your lab values.     Today   Denies in pain  in the right leg. Right leg is in a cast.  No acute events overnight.    VITAL SIGNS:  Blood pressure (!) 148/90, pulse 68, temperature 97.7 F (36.5 C), temperature source Oral, resp. rate 16, height 5\' 6"  (1.676 m), weight 89.2 kg (196 lb 11.2 oz), SpO2 98 %.  I/O:    Intake/Output Summary (Last 24 hours) at 06/30/2017 1442 Last data filed at 06/30/2017 1408 Gross per 24 hour  Intake 360 ml  Output -  Net 360 ml    PHYSICAL EXAMINATION:   GENERAL:  83 y.o.-year-old patient lying in bed in no acute distress.  EYES: Pupils equal, round, reactive to light and accommodation. No scleral icterus. Extraocular muscles intact.  HEENT: Head atraumatic, normocephalic. Oropharynx and nasopharynx clear.  NECK:  Supple, no jugular venous distention. No thyroid enlargement, no tenderness.  LUNGS: Normal breath sounds bilaterally, no wheezing, rales, rhonchi. No use of accessory muscles of respiration.  CARDIOVASCULAR: S1, S2 normal. No murmurs, rubs, or gallops.  ABDOMEN: Soft, nontender, nondistended. Bowel  sounds present. No organomegaly or mass.  EXTREMITIES: No cyanosis, clubbing or edema b/l.  RLE in a splint NEUROLOGIC: Cranial nerves II through XII are intact. No focal Motor or sensory deficits b/l.  Globally weak.  PSYCHIATRIC: The patient is alert and oriented x 3.  SKIN: No obvious rash, lesion, or ulcer.   DATA REVIEW:   CBC Recent Labs  Lab 06/29/17 0332  WBC 9.2  HGB 12.6  HCT 37.5  PLT 125*    Chemistries  Recent Labs  Lab 06/29/17 0332  NA 139  K 3.8  CL 104  CO2 27  GLUCOSE 102*  BUN 24*  CREATININE 1.42*  CALCIUM 9.0    Cardiac Enzymes No results for input(s): TROPONINI in the last 168 hours.  Microbiology Results  Results for orders placed or performed during the hospital encounter of 05/30/14  MRSA PCR Screening     Status: None   Collection Time: 05/30/14  6:28 AM  Result Value Ref Range Status   MRSA by PCR NEGATIVE NEGATIVE Final    Comment:        The GeneXpert MRSA Assay (FDA approved for NASAL specimens only), is one component of a comprehensive MRSA colonization surveillance program. It is not intended to diagnose MRSA infection nor to guide or monitor treatment for MRSA infections.   Culture, blood (routine x 2) Call MD if unable to obtain prior to antibiotics being given     Status: None   Collection Time: 05/30/14  8:35 AM  Result Value Ref Range Status   Specimen Description BLOOD RIGHT HAND  Final   Special Requests BOTTLES DRAWN AEROBIC ONLY 10CC  Final   Culture  Setup Time   Final    05/30/2014 13:25 Performed at [facility]   Final    NO GROWTH 5 DAYS Performed at [facility]    Report Status 06/05/2014 FINAL  Final  Culture, blood (routine x 2) Call MD if unable to obtain prior to antibiotics being given     Status: None   Collection Time: 05/30/14  8:50 AM  Result Value Ref Range Status   Specimen Description BLOOD LEFT HAND  Final   Special Requests BOTTLES DRAWN AEROBIC ONLY 10CC   Final   Culture  Setup Time   Final    05/30/2014 13:24 Performed at [facility]   Final    NO GROWTH 5 DAYS  Performed at [facility]    Report Status 06/05/2014 FINAL  Final  Culture, sputum-assessment     Status: None   Collection Time: 06/01/14  9:51 AM  Result Value Ref Range Status   Specimen Description SPUTUM  Final   Special Requests NONE  Final   Sputum evaluation   Final    THIS SPECIMEN IS ACCEPTABLE. RESPIRATORY CULTURE REPORT TO FOLLOW.   Report Status 06/01/2014 FINAL  Final  Culture, respiratory (NON-Expectorated)     Status: None   Collection Time: 06/01/14  9:51 AM  Result Value Ref Range Status   Specimen Description SPUTUM  Final   Special Requests NONE  Final   Gram Stain   Final    RARE WBC PRESENT, PREDOMINANTLY PMN FEW SQUAMOUS EPITHELIAL CELLS PRESENT FEW GRAM NEGATIVE RODS FEW GRAM POSITIVE COCCI IN PAIRS IN CHAINS    Culture   Final    NORMAL OROPHARYNGEAL FLORA Performed at [facility]    Report Status 06/04/2014 FINAL  Final    RADIOLOGY:  No results found.    Management plans discussed with the patient, family and they are in agreement.  CODE STATUS:     Code Status Orders  (From admission, onward)        Start     Ordered   06/28/17 2246  Full code  Continuous     06/28/17 2246    Code Status History    Date Active Date Inactive Code Status Order ID Comments User Context   05/30/2014 07:24 06/02/2014 17:21 Full Code [id number]  Louellen Molder, MD Inpatient      TOTAL TIME TAKING CARE OF THIS PATIENT: 40 minutes.    Henreitta Leber M.D on 06/30/2017 at 2:42 PM  Between 7am to 6pm - Pager - [phone]  After 6pm go to [email or web address] - Proofreader  [facility]  Office  [phone]  CC: Primary care physician; Jinny Sanders, MD

## 2017-06-30 NOTE — Clinical Social Work Note (Signed)
Clinical Social Work Assessment  Patient Details  Name: [name] MRN: [id number] Date of Birth: 07/24/1927  Date of referral:  06/30/17               Reason for consult:  Facility Placement                Permission sought to share information with:  Facility Contact Representative Permission granted to share information::  Yes, Verbal Permission Granted  Name::      Skilled Nursing Facility   Agency::   [facility] [place]   Relationship::     Contact Information:     Housing/Transportation Living arrangements for the past 2 months:  Single Family Home Source of Information:  Patient, Adult Children Patient Interpreter Needed:  None Criminal Activity/Legal Involvement Pertinent to Current Situation/Hospitalization:  No - Comment as needed Significant Relationships:  Adult Children Lives with:  Self Do you feel safe going back to the place where you live?  Yes Need for family participation in patient care:  Yes (Comment)  Care giving concerns:  Patient lives alone in [place].    Social Worker assessment / plan:  Clinical Social Worker (CSW) reviewed chart and noted that Ortho MD is recommending SNF. PT is recommending SNF. CSW met with patient alone at bedside to address consult. Patient was alert and oriented X4 and was sitting up in the bed. CSW introduced self and explained role of CSW department. Patient reported that she lives alone in [place] and has 2 adult children, her daughter [name] and son [name]. Per patient her children share HPOA. CSW explained SNF process. Patient is agreeable to SNF search in [place] and prefers [facility]. FL2 complete and faxed out. CSW contacted patient's daughter [name] and made her aware of above.   CSW presented bed offers to patient and she chose [facility]. Patient is medially stable for D/C to [facility] today. Per Michelle admissions coordinator at [facility] patient can come today to room 206-A. RN will call report at [phone] and  arrange EMS for transport. CSW sent D/C orders to [facility] via [facility]. Patient is aware of above. Patient's son [name] and daughter [name] are at bedside and aware of above. Please reconsult if future social work needs arise. CSW signing off.   Employment status:  Retired Insurance information:  Managed Medicare PT Recommendations:  Skilled Nursing Facility Information / Referral to community resources:  Skilled Nursing Facility  Patient/Family's Response to care:  Patient and her family are agreeable for her to go to [facility] today.   Patient/Family's Understanding of and Emotional Response to Diagnosis, Current Treatment, and Prognosis:  Patient was very pleasant and thanked CSW for assistance.   Emotional Assessment Appearance:  Appears stated age Attitude/Demeanor/Rapport:    Affect (typically observed):  Accepting, Adaptable, Pleasant Orientation:  Oriented to Self, Oriented to Place, Oriented to  Time, Oriented to Situation Alcohol / Substance use:  Not Applicable Psych involvement (Current and /or in the community):  No (Comment)  Discharge Needs  Concerns to be addressed:  Discharge Planning Concerns Readmission within the last 30 days:  No Current discharge risk:  Dependent with Mobility Barriers to Discharge:  No Barriers Identified   ,  M, LCSW 06/30/2017, 2:29 PM  

## 2017-06-30 NOTE — Clinical Social Work Placement (Signed)
   CLINICAL SOCIAL WORK PLACEMENT  NOTE  Date:  06/30/2017  Patient Details  Name: [name] MRN: [id number] Date of Birth: 18-Apr-1928  Clinical Social Work is seeking post-discharge placement for this patient at the [place] level of care (*CSW will initial, date and re-position this form in  chart as items are completed):  Yes   Patient/family provided with [facility] Work Department's list of facilities offering this level of care within the geographic area requested by the patient (or if unable, by the patient's family).  Yes   Patient/family informed of their freedom to choose among providers that offer the needed level of care, that participate in Medicare, Medicaid or managed care program needed by the patient, have an available bed and are willing to accept the patient.  Yes   Patient/family informed of [facility]'s ownership interest in [facility] and [facility], as well as of the fact that they are under no obligation to receive care at these facilities.  PASRR submitted to EDS on 06/30/17     PASRR number received on 06/30/17     Existing PASRR number confirmed on       FL2 transmitted to all facilities in geographic area requested by pt/family on 06/30/17     FL2 transmitted to all facilities within larger geographic area on       Patient informed that his/her managed care company has contracts with or will negotiate with certain facilities, including the following:        Yes   Patient/family informed of bed offers received.  Patient chooses bed at [facility] )     Physician recommends and patient chooses bed at      Patient to be transferred to [facility]) on 06/30/17.  Patient to be transferred to facility by [facility] )     Patient family notified on 06/30/17 of transfer.  Name of family member notified:  (Patient's daughter [name] is at bedside and aware of D/C today. )     PHYSICIAN     Additional Comment:    _______________________________________________ Filippa Yarbough, Veronia Beets, LCSW 06/30/2017, 2:28 PM

## 2017-06-30 NOTE — Care Management Obs Status (Signed)
[facility] NOTIFICATION   Patient Details  Name: [name] MRN: [id number] Date of Birth: October 06, 1927   Medicare Observation Status Notification Given:  Yes    Jolly Mango, RN 06/30/2017, 2:26 PM

## 2017-06-30 NOTE — Care Management CC44 (Signed)
Condition Code 44 Documentation Completed  Patient Details  Name: FELISSA BLOUCH MRN: [id number] Date of Birth: 27-Aug-1927   Condition Code 44 given:  Yes Patient signature on Condition Code 44 notice:  Yes Documentation of 2 MD's agreement:  Yes Code 44 added to claim:  Yes    Jolly Mango, RN 06/30/2017, 2:26 PM

## 2017-06-30 NOTE — Progress Notes (Signed)
Report called to [facility] EMS for transportation

## 2017-06-30 NOTE — NC FL2 (Signed)
[place] LEVEL OF CARE SCREENING TOOL     IDENTIFICATION  Patient Name: MYCHELE SEYLLER Birthdate: 1928/04/22 Sex: female Admission Date (Current Location): 06/28/2017  [place] and [place] Number:  Engineering geologist and Address:  [facility], [address], [place], [place] [address]      Provider Number: [id number]  Attending Physician Name and Address:  Henreitta Leber, MD  Relative Name and Phone Number:       Current Level of Care: Hospital Recommended Level of Care: [place] Prior Approval Number:    Date Approved/Denied:   PASRR Number: [id number])  Discharge Plan: SNF    Current Diagnoses: Patient Active Problem List   Diagnosis Date Noted  . Accidental fall from chair 06/28/2017  . Closed fracture of right distal fibula 06/28/2017  . Closed left ankle fracture 06/28/2017  . GAD (generalized anxiety disorder) 05/24/2017  . Major depressive disorder, recurrent episode, moderate ([place]) 05/25/2016  . Counseling regarding end of life decision making 12/13/2014  . Community acquired pneumonia 05/30/2014  . Descending thoracic aortic aneurysm ([place]) 05/30/2014  . Vitamin D deficiency 12/26/2012  . Uterine prolapse 06/23/2012  . Gout 06/06/2012  . INSOMNIA, CHRONIC, MILD 07/10/2010  . SENILE OSTEOPOROSIS 07/08/2009  . Carotid stenosis 11/01/2008  . Aortic valve stenosis 09/25/2008  . Abdominal aortic aneurysm ([place]) 09/24/2008  . LOW BACK PAIN, MILD 07/24/2008  . Coronary atherosclerosis 06/21/2008  . VENTRAL HERNIA 05/16/2008  . PULMONARY NODULE, RIGHT MIDDLE LOBE 02/01/2008  . CYSTOCELE WITH INCOMPLETE UTERINE PROLAPSE 02/20/2007  . Hypothyroidism 02/17/2007  . Hyperlipidemia 02/17/2007  . Essential hypertension 02/17/2007  . PAD (peripheral artery disease) ([place]) 02/17/2007  . GERD 02/17/2007  . CKD (chronic kidney disease) stage 3, GFR 30-59 ml/min (HCC) 02/17/2007  . ISCHEMIC COLITIS  05/19/2005    Orientation RESPIRATION BLADDER Height & Weight     Self, Time, Situation, Place  Normal Continent Weight: 196 lb 11.2 oz (89.2 kg) Height:  5\' 6"  (167.6 cm)  BEHAVIORAL SYMPTOMS/MOOD NEUROLOGICAL BOWEL NUTRITION STATUS      Continent Diet(Diet: Heart Healthy )  AMBULATORY STATUS COMMUNICATION OF NEEDS Skin   Extensive Assist Verbally Normal                       Personal Care Assistance Level of Assistance  Bathing, Feeding, Dressing Bathing Assistance: Limited assistance Feeding assistance: Independent Dressing Assistance: Limited assistance     Functional Limitations Info  Sight, Hearing, Speech Sight Info: Adequate Hearing Info: Adequate Speech Info: Adequate    SPECIAL CARE FACTORS FREQUENCY  PT (By licensed PT), OT (By licensed OT)     PT Frequency: (5) OT Frequency: (5)            Contractures      Additional Factors Info  Code Status, Allergies Code Status Info: (Full Code. ) Allergies Info: (Fish Oil, Naproxen, Tramadol, Codeine, Morphine Sulfate, Sulfonamide Derivatives)           Current Medications (06/30/2017):  This is the current hospital active medication list Current Facility-Administered Medications  Medication Dose Route Frequency Provider Last Rate Last Dose  . acetaminophen (TYLENOL) tablet 650 mg  650 mg Oral Q6H PRN Lance Coon, MD   650 mg at 06/29/17 0410   Or  . acetaminophen (TYLENOL) suppository 650 mg  650 mg Rectal Q6H PRN Lance Coon, MD      . aspirin EC tablet 81 mg  81 mg Oral QHS Lance Coon,  MD   81 mg at 06/29/17 2134  . atorvastatin (LIPITOR) tablet 80 mg  80 mg Oral Daily Lance Coon, MD   80 mg at 06/29/17 1707  . clopidogrel (PLAVIX) tablet 75 mg  75 mg Oral Daily Lance Coon, MD   75 mg at 06/30/17 0946  . febuxostat (ULORIC) tablet 40 mg  40 mg Oral Daily Lance Coon, MD   40 mg at 06/30/17 0947  . heparin injection 5,000 Units  5,000 Units Subcutaneous Driscilla Moats, MD   5,000  Units at 06/30/17 [phone]  . hydrALAZINE (APRESOLINE) injection 10 mg  10 mg Intravenous Q4H PRN Lance Coon, MD      . isosorbide mononitrate (IMDUR) 24 hr tablet 30 mg  30 mg Oral BID Lance Coon, MD   30 mg at 06/30/17 0946  . levothyroxine (SYNTHROID, LEVOTHROID) tablet 112 mcg  112 mcg Oral QAC breakfast Lance Coon, MD   112 mcg at 06/30/17 0827  . metoprolol tartrate (LOPRESSOR) tablet 12.5 mg  12.5 mg Oral BID Lance Coon, MD   12.5 mg at 06/30/17 0947  . ondansetron (ZOFRAN) tablet 4 mg  4 mg Oral Q6H PRN Lance Coon, MD       Or  . ondansetron [facility]) injection 4 mg  4 mg Intravenous Q6H PRN Lance Coon, MD      . sertraline (ZOLOFT) tablet 50 mg  50 mg Oral Daily Lance Coon, MD   50 mg at 06/30/17 0947  . traMADol (ULTRAM) tablet 50 mg  50 mg Oral Q6H PRN Henreitta Leber, MD         Discharge Medications: Please see discharge summary for a list of discharge medications.  Relevant Imaging Results:  Relevant Lab Results:   Additional Information (SSN: [id number])  Dionte Blaustein, Veronia Beets, LCSW

## 2017-06-30 NOTE — Evaluation (Signed)
Physical Therapy Evaluation Patient Details Name: [name] MRN: [id number] DOB: 12/15/1927 Today's Date: 06/30/2017   History of Present Illness  Pt admitted for L ankle fracture along with 5th MT fracture as well as R fibular fracture. Per notes, pt NWB for multiple weeks for healing. History of GERD and CKD.   Clinical Impression  Pt is a pleasant 83 year old female who was admitted for B LE non surgical fractures. At baseline, pt very functionally independent, no falls history. Pt performs bed mobility with min assist and able to maintain balance to sit at EOB. Currently due to [facility] restrictions, unable to transfer or ambulate at this time. Would possibly be able to perform slideboard transfer with use of B UE. Pt demonstrates deficits with strength/endurance/pain. Pt is currently not at baseline level and not safe to dc home. Would benefit from skilled PT to address above deficits and promote optimal return to PLOF; recommend transition to STR upon discharge from acute hospitalization.       Follow Up Recommendations SNF    Equipment Recommendations  None recommended by PT    Recommendations for Other Services       Precautions / Restrictions Precautions Precautions: Fall Restrictions Weight Bearing Restrictions: Yes RLE Weight Bearing: Non weight bearing LLE Weight Bearing: Non weight bearing      Mobility  Bed Mobility Overal bed mobility: Needs Assistance Bed Mobility: Supine to Sit     Supine to sit: Min assist     General bed mobility comments: needs assist for sequencing and safety. Once seated, able to sit with upright posture. Needs help returning supine and positioning in bed.  Transfers                 General transfer comment: unable to perform secondary to B NWB.   Ambulation/Gait                Stairs            Wheelchair Mobility    Modified Rankin (Stroke Patients Only)       Balance Overall balance assessment:  Needs assistance Sitting-balance support: Feet supported;Bilateral upper extremity supported Sitting balance-Leahy Scale: Good                                       Pertinent Vitals/Pain Pain Assessment: No/denies pain    Home Living Family/patient expects to be discharged to:: Private residence Living Arrangements: Alone Available Help at Discharge: Family Type of Home: House Home Access: Stairs to enter Entrance Stairs-Rails: None Entrance Stairs-Number of Steps: 1 Home Layout: One level Home Equipment: Environmental consultant - 2 wheels;Walker - standard;Bedside commode      Prior Function Level of Independence: Independent         Comments: active and no falls history until this most recent fall     Hand Dominance        Extremity/Trunk Assessment   Upper Extremity Assessment Upper Extremity Assessment: Overall WFL for tasks assessed    Lower Extremity Assessment Lower Extremity Assessment: Generalized weakness(B LE grossly 3+/5)       Communication   Communication: No difficulties  Cognition Arousal/Alertness: Awake/alert Behavior During Therapy: WFL for tasks assessed/performed Overall Cognitive Status: Within Functional Limits for tasks assessed  General Comments      Exercises Other Exercises Other Exercises: Able to participate in supine ther-ex on B LE including SLRs, hip abd/add, and LAQ. All ther-ex performed 5-10 reps on B LE with cga.    Assessment/Plan    PT Assessment Patient needs continued PT services  PT Problem List Decreased strength;Decreased balance;Decreased mobility;Decreased knowledge of use of DME       PT Treatment Interventions DME instruction;Gait training;Therapeutic exercise    PT Goals (Current goals can be found in the Care Plan section)  Acute Rehab PT Goals Patient Stated Goal: to go to rehab PT Goal Formulation: With patient Time For Goal Achievement:  07/14/17 Potential to Achieve Goals: Good    Frequency 7X/week   Barriers to discharge Inaccessible home environment;Decreased caregiver support      Co-evaluation               AM-PAC PT "6 Clicks" Daily Activity  Outcome Measure Difficulty turning over in bed (including adjusting bedclothes, sheets and blankets)?: Unable Difficulty moving from lying on back to sitting on the side of the bed? : Unable Difficulty sitting down on and standing up from a chair with arms (e.g., wheelchair, bedside commode, etc,.)?: Unable Help needed moving to and from a bed to chair (including a wheelchair)?: A Lot Help needed walking in hospital room?: A Lot Help needed climbing 3-5 steps with a railing? : Total 6 Click Score: 8    End of Session   Activity Tolerance: Patient tolerated treatment well Patient left: in bed;with bed alarm set Nurse Communication: Mobility status PT Visit Diagnosis: Muscle weakness (generalized) (M62.81);Difficulty in walking, not elsewhere classified (R26.2)    Time: 1344-1400 PT Time Calculation (min) (ACUTE ONLY): 16 min   Charges:   PT Evaluation $PT Eval Low Complexity: 1 Low PT Treatments $Therapeutic Exercise: 8-22 mins   PT G Codes:   PT G-Codes **NOT FOR INPATIENT CLASS** Functional Assessment Tool Used: AM-PAC 6 Clicks Basic Mobility Functional Limitation: Mobility: Walking and moving around Mobility: Walking and Moving Around Current Status (F0263): At least 80 percent but less than 100 percent impaired, limited or restricted Mobility: Walking and Moving Around Goal Status [phone]): At least 60 percent but less than 80 percent impaired, limited or restricted    Greggory Stallion, PT, DPT [phone]   Emme Rosenau 06/30/2017, 2:16 PM

## 2017-07-01 ENCOUNTER — Other Ambulatory Visit: Payer: Self-pay

## 2017-07-01 ENCOUNTER — Encounter: Payer: Self-pay | Admitting: Gerontology

## 2017-07-01 MED ORDER — TRAMADOL HCL 50 MG PO TABS: 50.0000 mg | ORAL_TABLET | Freq: Four times a day (QID) | ORAL | 1 refills | Status: DC | PRN

## 2017-07-01 NOTE — Progress Notes (Signed)
Location:   [facility] [place] Room Number: [place] of Service:  [phone]) Provider:  Toni Arthurs, NP-C  Jinny Sanders, MD  Patient Care Team: Jinny Sanders, MD as PCP - General (Family Medicine) Minna Merritts, MD (Cardiology) [name], Shaune Pascal, MD (Cardiology)  Extended Emergency Contact Information Primary Emergency Contact: [name] Address: [address], [place] Phone: [phone] Mobile Phone: [phone] Relation: Daughter Secondary Emergency Contact: Freddy Finner of [place] Phone: [phone] Mobile Phone: [phone] Relation: Son  Code Status:  FULL Goals of care: Advanced Directive information Advanced Directives 07/01/2017  Does Patient Have a Medical Advance Directive? No  Type of Advance Directive -  Does patient want to make changes to medical advance directive? -  Copy of [place] in Chart? -  Would patient like information on creating a medical advance directive? No - Patient declined     Chief Complaint  Patient presents with  . Hospitalization Follow-up    Recheck bilateral ankle fractures    HPI:  Pt is a 83 y.o. female seen today for a hospital f/u s/p admission from [facility] for B- ankle fractures. Pt was admitted to the facility for rehab. Pt is A&O, pleasant. Pt reports her pain is well controlled at this point. She states she rarely has pain in the ankles. Right ankle is in a camboot. Left ankle in acewrap splint. All toes warm, cap refill WNL. Pt denies numbness/tingling. Sensation intact. Pt reports her appetite has been decreased while in the hospital. Voiding without difficulty and having regular BMs. VSS. No other complaints.   Past Medical History:  Diagnosis Date  . AAA (abdominal aortic aneurysm) (HCC)    Recurrent; s/p repair, now 3.8 cam; h/o ischemic bowel 2004-11-05  . Aortic stenosis    Moderate; Echo 12/09.  moderate AS mean 19. AVA 0.88  . Basal cell carcinoma of nose 1980s  . Bruises easily   . CAD (coronary artery disease)    s/p NSTEMI 1/07 with BMS to OM; cath 12/09 due to positive Myoview, LM ok. LAD 40%. LCX 80-90% mid. RCA chronically occluded with R --> L collats  . Carotid artery stenosis    R 60-79% L 0-39% (6/10) - stable  . CRI (chronic renal insufficiency)    Mild, Cr 1.3  . Depression 11/05/2004   With death of spouse  . Full dentures   . GERD (gastroesophageal reflux disease)   . HTN (hypertension)   . Hyperlipidemia    Increased LFTs on Vytorin. Changed to Lipitor.  . Hyperparathyroidism   . Hypothyroidism   . Incontinence   . Myocardial infarction ([place]) Nov 05, 2005  . PAD (peripheral artery disease) (HCC)    s/p iliac stents  . Renal artery stenosis (HCC)    Atrophic L kidney. Moderate blockage on R  . Ventral hernia    Moderate sized  . Vitamin D deficiency    Past Surgical History:  Procedure Laterality Date  . ABDOMINAL AORTIC ANEURYSM REPAIR  1993-11-05, 07/01/2005  . ANGIOPLASTY  2003-11-06 - 07/01/2005  . APPENDECTOMY  1954  . breast biospy  1996  . BREAST SURGERY    . CATARACT EXTRACTION, BILATERAL    . CHOLECYSTECTOMY  07/01/2005  . CHOLECYSTECTOMY  1987  . COLONOSCOPY  07/07/2005   Colonic erosions, ulcers proctitis  . COLONOSCOPY W/ ENDOSCOPIC [place]  07/01/2005   [place]  kidney diffuse atrophy  . CORONARY STENT PLACEMENT    . CORONARY STENT PLACEMENT  07/2005   MI   . HERNIA REPAIR  1997  . HOSP  04/2005   ? abd. prob. mesenteric ischemia  . HOSP  01/2006   Hosp for reflux (chest pain)  . ILIAC ARTERY STENT  2005 - 07/01/2005   Stent placement, both legs - PVD in arteries  . NSTEMI  08/13/2005   1/07 s/p stent in circumflex  . TONSILLECTOMY  1943  . UPPER GASTROINTESTINAL ENDOSCOPY  07/07/2005   Nml    Allergies  Allergen Reactions  . Fish Oil Other (See Comments)    Severe indigestion  . Naproxen Nausea And Vomiting  . Tramadol Nausea And Vomiting  . Codeine  Nausea And Vomiting and Rash  . Morphine Sulfate Nausea And Vomiting and Rash  . Sulfonamide Derivatives Nausea And Vomiting and Rash    Allergies as of 06/30/2017      Reactions   Fish Oil Other (See Comments)   Severe indigestion   Naproxen Nausea And Vomiting   Tramadol Nausea And Vomiting   Codeine Nausea And Vomiting, Rash   Morphine Sulfate Nausea And Vomiting, Rash   Sulfonamide Derivatives Nausea And Vomiting, Rash      Medication List    Notice   This visit is on the same day as an admission, and a visit start time could not be determined. If the visit took place after discharge, manually review the med list with the patient.     Review of Systems  Constitutional: Negative for activity change, appetite change, chills, diaphoresis and fever.  HENT: Negative for congestion, mouth sores, nosebleeds, postnasal drip, sneezing, sore throat, trouble swallowing and voice change.   Respiratory: Negative for apnea, cough, choking, chest tightness, shortness of breath and wheezing.   Cardiovascular: Negative for chest pain, palpitations and leg swelling.  Gastrointestinal: Negative for abdominal distention, abdominal pain, constipation, diarrhea and nausea.  Genitourinary: Negative for difficulty urinating, dysuria, frequency and urgency.  Musculoskeletal: Positive for arthralgias (typical arthritis). Negative for back pain, gait problem and myalgias.  Skin: Negative for color change, pallor, rash and wound.  Neurological: Negative for dizziness, tremors, syncope, speech difficulty, weakness, numbness and headaches.  Psychiatric/Behavioral: Negative for agitation and behavioral problems.  All other systems reviewed and are negative.   Immunization History  Administered Date(s) Administered  . Influenza Split 06/18/2011, 06/14/2012, 04/06/2013  . Influenza Whole 05/12/2006, 04/08/2009, 07/03/2010  . Influenza,inj,Quad PF,6+ Mos 04/02/2014, 04/24/2015, 04/12/2016, 04/18/2017  .  Pneumococcal Conjugate-13 12/13/2014  . Pneumococcal Polysaccharide-23 04/11/2005, 05/12/2006  . Td 07/08/2009  . Zoster 07/08/2009   Pertinent  Health Maintenance Due  Topic Date Due  . DEXA SCAN  10/27/1994  . INFLUENZA VACCINE  Completed  . PNA vac Low Risk Adult  Completed   Fall Risk  06/24/2016 12/13/2014 06/26/2013  Falls in the past year? No No No   Functional Status Survey:    Vitals:   06/30/17 1444  BP: (!) 142/59  Pulse: 71  Resp: 19  Temp: 97.6 F (36.4 C)  TempSrc: Oral  SpO2: 95%  Weight: 184 lb 9.6 oz (83.7 kg)  Height: 5\' 6"  (1.676 m)   Body mass index is 29.8 kg/m. Physical Exam  Constitutional: She is oriented to person, place, and time. Vital signs are normal. She appears well-developed and well-nourished. She is active and cooperative. She does not appear ill. No distress.  HENT:  Head: Normocephalic and atraumatic.  Mouth/Throat: Uvula  is midline, oropharynx is clear and moist and mucous membranes are normal. Mucous membranes are not pale, not dry and not cyanotic.  Eyes: Conjunctivae, EOM and lids are normal. Pupils are equal, round, and reactive to light.  Neck: Trachea normal, normal range of motion and full passive range of motion without pain. Neck supple. No JVD present. No tracheal deviation, no edema and no erythema present. No thyromegaly present.  Cardiovascular: Normal rate, normal heart sounds, intact distal pulses and normal pulses. An irregular rhythm present. Exam reveals no gallop, no distant heart sounds and no friction rub.  No murmur heard. Pulses:      Dorsalis pedis pulses are 2+ on the right side, and 2+ on the left side.  Minimal BLE edema  Pulmonary/Chest: Effort normal and breath sounds normal. No accessory muscle usage. No respiratory distress. She has no decreased breath sounds. She has no wheezes. She has no rhonchi. She has no rales. She exhibits no tenderness.  Abdominal: Soft. Normal appearance and bowel sounds are normal.  She exhibits no distension and no ascites. There is no tenderness.  Musculoskeletal: She exhibits no edema.       Right ankle: She exhibits decreased range of motion. Tenderness.       Left ankle: She exhibits decreased range of motion. Tenderness.  Expected osteoarthritis, stiffness; Bilateral Calves soft, supple. Negative Homan's Sign. B- pedal pulses equal; cap refills WNL; R ankle in [place], L ankle in ace wrap splint  Neurological: She is alert and oriented to person, place, and time. She has normal strength. Gait abnormal.  Skin: Skin is warm, dry and intact. She is not diaphoretic. No cyanosis. No pallor. Nails show no clubbing.  Psychiatric: She has a normal mood and affect. Her speech is normal and behavior is normal. Judgment and thought content normal. Cognition and memory are normal.  Nursing note and vitals reviewed.   Labs reviewed: Recent Labs    06/28/17 2000 06/29/17 0332  NA 140 139  K 3.9 3.8  CL 104 104  CO2 26 27  GLUCOSE 98 102*  BUN 24* 24*  CREATININE 1.42* 1.42*  CALCIUM 9.4 9.0   No results for input(s): AST, ALT, ALKPHOS, BILITOT, PROT, ALBUMIN in the last 8760 hours. Recent Labs    06/28/17 2000 06/29/17 0332  WBC 11.0 9.2  HGB 13.7 12.6  HCT 40.6 37.5  MCV 87.7 88.4  PLT 142* 125*   Lab Results  Component Value Date   TSH 0.76 02/27/2016   No results found for: HGBA1C Lab Results  Component Value Date   CHOL 151 06/21/2016   HDL 35.80 (L) 06/21/2016   LDLCALC 86 11/20/2014   LDLDIRECT 29.0 06/21/2016   TRIG 234.0 (H) 06/21/2016   CHOLHDL 4 06/21/2016    Significant Diagnostic Results in last 30 days:  No results found.  Assessment/Plan 1. Closed fracture of left ankle with routine healing, subsequent encounter 2. Closed fracture of distal end of right fibula with routine healing, unspecified fracture morphology, subsequent encounter  Continue working with PT/OT  Continue exercises as taught by PT/OT  Ice packs to the ankles  prn for pain/edema  Elevate legs when at rest  Aspirin 325 mg po BID for DVT prophylaxis  Continue Tramadol 50 mg po Q 6 hours prn pain  Follow up with Ortho as instructed   Family/ staff Communication:   Total Time:  Documentation:  Face to Face:  Family/Phone:   Labs/tests ordered:    Becky Ports, NP-C Geriatrics  [facility] [address], [place] [address] Cell Phone (Mon-Fri 8am-5pm):  [phone] On Call:  [phone] & follow prompts after 5pm & weekends Office Phone:  [phone] Office Fax:  [phone]

## 2017-07-01 NOTE — Telephone Encounter (Signed)
Rx sent to [facility] phone : [phone] , fax : [phone]  

## 2017-07-12 ENCOUNTER — Encounter
Admission: RE | Admit: 2017-07-12 | Discharge: 2017-07-12 | Disposition: A | Discharge status: Home / Self Care | Payer: Medicare Other | Source: Ambulatory Visit | Attending: Internal Medicine | Admitting: Internal Medicine

## 2017-07-15 ENCOUNTER — Non-Acute Institutional Stay (SKILLED_NURSING_FACILITY): Payer: Medicare Other | Admitting: Gerontology

## 2017-07-15 DIAGNOSIS — S82892D Other fracture of left lower leg, subsequent encounter for closed fracture with routine healing: Secondary | ICD-10-CM

## 2017-07-15 DIAGNOSIS — S82831D Other fracture of upper and lower end of right fibula, subsequent encounter for closed fracture with routine healing: Secondary | ICD-10-CM

## 2017-07-20 ENCOUNTER — Other Ambulatory Visit: Payer: Self-pay | Admitting: Family Medicine

## 2017-07-20 NOTE — Telephone Encounter (Signed)
Last office visit 06/28/2080.  Last thyroid labs 02/27/2016.  Refill?

## 2017-07-21 ENCOUNTER — Non-Acute Institutional Stay (SKILLED_NURSING_FACILITY): Payer: Medicare Other | Admitting: Gerontology

## 2017-07-21 DIAGNOSIS — S82892D Other fracture of left lower leg, subsequent encounter for closed fracture with routine healing: Secondary | ICD-10-CM | POA: Diagnosis not present

## 2017-07-21 DIAGNOSIS — S82831D Other fracture of upper and lower end of right fibula, subsequent encounter for closed fracture with routine healing: Secondary | ICD-10-CM | POA: Diagnosis not present

## 2017-07-21 NOTE — Telephone Encounter (Signed)
Will check at next appt.Marland Kitchen Has been stable.  refilled.

## 2017-07-22 ENCOUNTER — Encounter: Payer: Self-pay | Admitting: Gerontology

## 2017-07-22 NOTE — Progress Notes (Signed)
Patient ID: [name], female   DOB: 01-19-1928, 84 y.o.   MRN: [id number]  Location:      Place of Service:  SNF (31) Provider:  Toni Arthurs, NP-C  Jinny Sanders, MD  Patient Care Team: Jinny Sanders, MD as PCP - General (Family Medicine) Minna Merritts, MD (Cardiology) [name], Shaune Pascal, MD (Cardiology)  Extended Emergency Contact Information Primary Emergency Contact: [name] Address: [place], [place] Phone: [phone] Mobile Phone: [phone] Relation: Daughter Secondary Emergency Contact: Freddy Finner of [place] Phone: [phone] Mobile Phone: [phone] Relation: Son  Code Status:  FULL Goals of care: Advanced Directive information Advanced Directives 07/21/2017  Does Patient Have a Medical Advance Directive? No  Type of Advance Directive -  Does patient want to make changes to medical advance directive? -  Copy of [place] in Chart? -  Would patient like information on creating a medical advance directive? No - Patient declined     Chief Complaint  Patient presents with  . Medical Management of Chronic Issues    routine visit    HPI:  Pt is a 84 y.o. female seen today for medical management of chronic diseases. Pt was admitted to the facility for rehab following hospitalization for B- ankle fractures. Pt has been participating in PT and OT. She has been progressing well within the limitations of weight bearing status. Pt reports her pain is well controlled on current regimen. Appetite is good. She is voiding well and having regular BMs. Pt denies chest pain or shortness of breath. VSS. No other complaints.        Past Medical History:  Diagnosis Date  . AAA (abdominal aortic aneurysm) (HCC)    Recurrent; s/p repair, now 3.8 cam; h/o ischemic bowel 10-22-2004  . Aortic stenosis    Moderate; Echo 12/09. moderate AS mean 19. AVA 0.88  . Basal cell  carcinoma of nose 1980s  . Bruises easily   . CAD (coronary artery disease)    s/p NSTEMI 1/07 with BMS to OM; cath 12/09 due to positive Myoview, LM ok. LAD 40%. LCX 80-90% mid. RCA chronically occluded with R --> L collats  . Carotid artery stenosis    R 60-79% L 0-39% (6/10) - stable  . CRI (chronic renal insufficiency)    Mild, Cr 1.3  . Depression 2004/10/22   With death of spouse  . Full dentures   . GERD (gastroesophageal reflux disease)   . HTN (hypertension)   . Hyperlipidemia    Increased LFTs on Vytorin. Changed to Lipitor.  . Hyperparathyroidism   . Hypothyroidism   . Incontinence   . Myocardial infarction ([place]) 10/22/2005  . PAD (peripheral artery disease) (HCC)    s/p iliac stents  . Renal artery stenosis (HCC)    Atrophic L kidney. Moderate blockage on R  . Ventral hernia    Moderate sized  . Vitamin D deficiency    Past Surgical History:  Procedure Laterality Date  . ABDOMINAL AORTIC ANEURYSM REPAIR  1993-10-22, 07/01/2005  . ANGIOPLASTY  10-23-03 - 07/01/2005  . APPENDECTOMY  1954  . breast biospy  1996  . BREAST SURGERY    . CATARACT EXTRACTION, BILATERAL    . CHOLECYSTECTOMY  07/01/2005  . CHOLECYSTECTOMY  1987  . COLONOSCOPY  07/07/2005   Colonic erosions, ulcers proctitis  . COLONOSCOPY W/ ENDOSCOPIC [place]  07/01/2005  [place] kidney diffuse atrophy  . CORONARY STENT PLACEMENT    . CORONARY STENT PLACEMENT  07/2005   MI   . HERNIA REPAIR  1997  . HOSP  04/2005   ? abd. prob. mesenteric ischemia  . HOSP  01/2006   Hosp for reflux (chest pain)  . ILIAC ARTERY STENT  2005 - 07/01/2005   Stent placement, both legs - PVD in arteries  . NSTEMI  08/13/2005   1/07 s/p stent in circumflex  . TONSILLECTOMY  1943  . UPPER GASTROINTESTINAL ENDOSCOPY  07/07/2005   Nml    Allergies  Allergen Reactions  . Fish Oil Other (See Comments)    Severe indigestion  . Naproxen Nausea And Vomiting  . Tramadol Nausea And Vomiting  . Codeine Nausea And Vomiting and Rash  . Morphine  Sulfate Nausea And Vomiting and Rash  . Sulfonamide Derivatives Nausea And Vomiting and Rash    Allergies as of 07/15/2017      Reactions   Fish Oil Other (See Comments)   Severe indigestion   Naproxen Nausea And Vomiting   Tramadol Nausea And Vomiting   Codeine Nausea And Vomiting, Rash   Morphine Sulfate Nausea And Vomiting, Rash   Sulfonamide Derivatives Nausea And Vomiting, Rash      Medication List        Accurate as of 07/15/17 11:59 PM. Always use your most recent med list.          acetaminophen 500 MG tablet Commonly known as:  TYLENOL Take 1,000 mg by mouth 2 (two) times daily as needed for pain.   aspirin 325 MG tablet Take 325 mg by mouth 2 (two) times daily.   atorvastatin 80 MG tablet Commonly known as:  LIPITOR Take 1 tablet (80 mg total) by mouth daily.   Cholecalciferol 1000 units capsule Take 3,000 Units by mouth daily.   clopidogrel 75 MG tablet Commonly known as:  PLAVIX Take 1 tablet (75 mg total) by mouth daily.   clorazepate 3.75 MG tablet Commonly known as:  TRANXENE Take 3.75 mg by mouth at bedtime as needed for anxiety.   colestipol 1 g tablet Commonly known as:  COLESTID Take 2 tablets (2 g total) by mouth 2 (two) times daily.   cyclobenzaprine 10 MG tablet Commonly known as:  FLEXERIL Take 10 mg by mouth at bedtime as needed for muscle spasms.   ENSURE Take 237 mLs by mouth 2 (two) times daily between meals.   ESTRACE VAGINAL 0.1 MG/GM vaginal cream Generic drug:  estradiol Place 2 g vaginally 2 (two) times a week. Thursday and Sunday   fluticasone 50 MCG/ACT nasal spray Commonly known as:  FLONASE Place 1 spray into both nostrils daily.   isosorbide mononitrate 30 MG 24 hr tablet Commonly known as:  IMDUR TAKE 1 TABLET TWICE A DAY   levothyroxine 112 MCG tablet Commonly known as:  SYNTHROID, LEVOTHROID TAKE 1 TABLET DAILY   levothyroxine 112 MCG tablet Commonly known as:  SYNTHROID, LEVOTHROID TAKE 1 TABLET DAILY     metoprolol tartrate 25 MG tablet Commonly known as:  LOPRESSOR TAKE ONE-HALF (1/2) TABLET TWICE A DAY   sertraline 50 MG tablet Commonly known as:  ZOLOFT TAKE 1 TABLET (50 MG TOTAL) BY MOUTH DAILY.   traMADol 50 MG tablet Commonly known as:  ULTRAM Take 1 tablet (50 mg total) by mouth every 6 (six) hours as needed for moderate pain.   ULORIC 80 MG Tabs Generic drug:  Febuxostat Take 80 mg  by mouth daily.   zolpidem 5 MG tablet Commonly known as:  AMBIEN Take 5 mg by mouth at bedtime as needed for sleep.       Review of Systems  Constitutional: Negative for activity change, appetite change, chills, diaphoresis and fever.  HENT: Negative for congestion, mouth sores, nosebleeds, postnasal drip, sneezing, sore throat, trouble swallowing and voice change.   Respiratory: Negative for apnea, cough, choking, chest tightness, shortness of breath and wheezing.   Cardiovascular: Negative for chest pain, palpitations and leg swelling.  Gastrointestinal: Negative for abdominal distention, abdominal pain, constipation, diarrhea and nausea.  Genitourinary: Negative for difficulty urinating, dysuria, frequency and urgency.  Musculoskeletal: Positive for arthralgias (typical arthritis). Negative for back pain, gait problem and myalgias.  Skin: Negative for color change, pallor, rash and wound.  Neurological: Negative for dizziness, tremors, syncope, speech difficulty, weakness, numbness and headaches.  Psychiatric/Behavioral: Negative for agitation and behavioral problems.  All other systems reviewed and are negative.   Immunization History  Administered Date(s) Administered  . Influenza Split 06/18/2011, 06/14/2012, 04/06/2013  . Influenza Whole 05/12/2006, 04/08/2009, 07/03/2010  . Influenza,inj,Quad PF,6+ Mos 04/02/2014, 04/24/2015, 04/12/2016, 04/18/2017  . Pneumococcal Conjugate-13 12/13/2014  . Pneumococcal Polysaccharide-23 04/11/2005, 05/12/2006  . Td 07/08/2009  . Zoster  07/08/2009   Pertinent  Health Maintenance Due  Topic Date Due  . DEXA SCAN  10/27/1994  . INFLUENZA VACCINE  Completed  . PNA vac Low Risk Adult  Completed   Fall Risk  06/24/2016 12/13/2014 06/26/2013  Falls in the past year? No No No   Functional Status Survey:    Vitals:   07/18/17 1544  BP: 133/75  Pulse: 86  Resp: 20  Temp: 97.6 F (36.4 C)  TempSrc: Oral  SpO2: 96%  Weight: 184 lb (83.5 kg)   Body mass index is 29.7 kg/m. Physical Exam  Constitutional: She is oriented to person, place, and time. Vital signs are normal. She appears well-developed and well-nourished. She is active and cooperative. She does not appear ill. No distress.  HENT:  Head: Normocephalic and atraumatic.  Mouth/Throat: Uvula is midline, oropharynx is clear and moist and mucous membranes are normal. Mucous membranes are not pale, not dry and not cyanotic.  Eyes: Conjunctivae, EOM and lids are normal. Pupils are equal, round, and reactive to light.  Neck: Trachea normal, normal range of motion and full passive range of motion without pain. Neck supple. No JVD present. No tracheal deviation, no edema and no erythema present. No thyromegaly present.  Cardiovascular: Normal rate, intact distal pulses and normal pulses. An irregular rhythm present. Exam reveals no gallop, no distant heart sounds and no friction rub.  Murmur heard. Pulses:      Dorsalis pedis pulses are 2+ on the right side, and 2+ on the left side.  No edema  Pulmonary/Chest: Effort normal and breath sounds normal. No accessory muscle usage. No respiratory distress. She has no decreased breath sounds. She has no wheezes. She has no rhonchi. She has no rales. She exhibits no tenderness.  Abdominal: Soft. Normal appearance and bowel sounds are normal. She exhibits no distension and no ascites. There is no tenderness.  Musculoskeletal: She exhibits no edema.       Right ankle: She exhibits decreased range of motion. Tenderness.       Left  ankle: She exhibits decreased range of motion. Tenderness.  Expected osteoarthritis, stiffness; Bilateral Calves soft, supple. Negative Homan's Sign. B- pedal pulses equal; B-ankle fractures; B- cap refills WNL  Neurological: She  is alert and oriented to person, place, and time. She has normal strength. Coordination and gait abnormal.  Skin: Skin is warm, dry and intact. She is not diaphoretic. No cyanosis. No pallor. Nails show no clubbing.  Cap refills WNL on B-feet  Psychiatric: She has a normal mood and affect. Her speech is normal and behavior is normal. Judgment and thought content normal. Cognition and memory are normal.  Nursing note and vitals reviewed.   Labs reviewed: Recent Labs    06/28/17 2000 06/29/17 0332  NA 140 139  K 3.9 3.8  CL 104 104  CO2 26 27  GLUCOSE 98 102*  BUN 24* 24*  CREATININE 1.42* 1.42*  CALCIUM 9.4 9.0   No results for input(s): AST, ALT, ALKPHOS, BILITOT, PROT, ALBUMIN in the last 8760 hours. Recent Labs    06/28/17 2000 06/29/17 0332  WBC 11.0 9.2  HGB 13.7 12.6  HCT 40.6 37.5  MCV 87.7 88.4  PLT 142* 125*   Lab Results  Component Value Date   TSH 0.76 02/27/2016   No results found for: HGBA1C Lab Results  Component Value Date   CHOL 151 06/21/2016   HDL 35.80 (L) 06/21/2016   LDLCALC 86 11/20/2014   LDLDIRECT 29.0 06/21/2016   TRIG 234.0 (H) 06/21/2016   CHOLHDL 4 06/21/2016    Significant Diagnostic Results in last 30 days:  Dg Ankle Complete Left  Result Date: 06/28/2017 CLINICAL DATA:  Fall with foot and ankle pain EXAM: LEFT ANKLE COMPLETE - 3+ VIEW COMPARISON:  None. FINDINGS: Lateral soft tissue swelling. Possible avulsion off the dorsal anterior cortex of the talus. Large amount of dorsal soft tissue swelling. There may be additional small avulsion off the anterior, dorsal navicular. IMPRESSION: Large amount of dorsal soft tissue swelling and lateral soft tissue swelling. Possible cortical avulsion injuries involving the  dorsal anterior talus and dorsal anterior navicular. Electronically Signed   By: Donavan Foil M.D.   On: 06/28/2017 17:19   Dg Ankle Complete Right  Result Date: 06/28/2017 CLINICAL DATA:  Fall with foot and ankle pain and swelling EXAM: RIGHT ANKLE - COMPLETE 3+ VIEW COMPARISON:  None. FINDINGS: Large amount of lateral soft tissue swelling. Acute fracture involving the distal metaphysis and shaft of the fibula with about 1/4 shaft diameter of lateral displacement of distal fracture fragment. Ankle mortise is symmetric. IMPRESSION: Large amount of soft tissue swelling with acute, mildly displaced distal fibular fracture. Electronically Signed   By: Donavan Foil M.D.   On: 06/28/2017 17:14   Dg Foot Complete Left  Result Date: 06/28/2017 CLINICAL DATA:  Fall with pain and swelling EXAM: LEFT FOOT - COMPLETE 3+ VIEW COMPARISON:  None. FINDINGS: No subluxation is evident. Acute, mildly comminuted fracture involving the base of the fifth metatarsal with about 3 mm separation of fracture fragment from parent bone. IMPRESSION: Acute, slightly comminuted and displaced fracture involving the base of the fifth metatarsal. Electronically Signed   By: Donavan Foil M.D.   On: 06/28/2017 17:17   Dg Foot Complete Right  Result Date: 06/28/2017 CLINICAL DATA:  Fall with foot pain and swelling EXAM: RIGHT FOOT COMPLETE - 3+ VIEW COMPARISON:  None. FINDINGS: Partially visualized distal fibular fracture. Possible small avulsion fracture at the dorsal anterior aspect of the talus bone. No malalignment. Degenerative changes at the first MTP joint IMPRESSION: 1. Distal fibular fracture 2. Possible avulsion fracture involving the dorsal, anterior aspect of the talus Electronically Signed   By: Donavan Foil M.D.   On:  06/28/2017 17:15    Assessment/Plan 1. Closed fracture of left ankle with routine healing, subsequent encounter 2. Closed fracture of distal end of right fibula with routine healing, unspecified  fracture morphology, subsequent encounter  Continue working with PT/OT  Continue exercises as taught by PT/OT  Ice pack prn for pain/swelling  Continue Tramadol 50 mg po Q 6 hours prn pain  Continue APAP 1,000 mg po BID prn for pain  Monitor skin integrity  Follow up with Ortho as instructed  Family/ staff Communication:   Total Time:  Documentation:  Face to Face:  Family/Phone:   Labs/tests ordered:    Medication list reviewed and assessed for continued appropriateness. Monthly medication orders reviewed and signed.  Vikki Ports, NP-C Geriatrics [facility] [phone], [place] [address] Cell Phone (Mon-Fri 8am-5pm):  [phone] On Call:  [phone] & follow prompts after 5pm & weekends Office Phone:  [phone] Office Fax:  [phone]

## 2017-07-22 NOTE — Progress Notes (Signed)
Location:   [facility] Room Number: [place] of Service:  SNF [phone]) Provider:  Toni Arthurs, NP-C  Jinny Sanders, MD  Patient Care Team: Jinny Sanders, MD as PCP - General (Family Medicine) Minna Merritts, MD (Cardiology) [name], Shaune Pascal, MD (Cardiology)  Extended Emergency Contact Information Primary Emergency Contact: [name] Address: [place], [place] Phone: [phone] Mobile Phone: [phone] Relation: Daughter Secondary Emergency Contact: Freddy Finner of [place] Phone: [phone] Mobile Phone: [phone] Relation: Son  Code Status: FULL Goals of care: Advanced Directive information Advanced Directives 07/21/2017  Does Patient Have a Medical Advance Directive? No  Type of Advance Directive -  Does patient want to make changes to medical advance directive? -  Copy of [place] in Chart? -  Would patient like information on creating a medical advance directive? No - Patient declined     Chief Complaint  Patient presents with  . Medical Management of Chronic Issues    Routine Visit    HPI:  Pt is a 84 y.o. female seen today for medical management of chronic diseases.   Closed fracture of left ankle with routine healing, subsequent encounter  Acewrap splint in place. Cap refills WNL Toes warm. Pain well controlled.   Closed fracture of distal end of right fibula with routine healing, unspecified fracture morphology, subsequent encounter [place] in place. Cap refills WNL. Toes warm. Pain well controlled  Pt reports she is eating well, voids without difficulty and having regular BMs. She is participating in PT and OT.     Past Medical History:  Diagnosis Date  . AAA (abdominal aortic aneurysm) (HCC)    Recurrent; s/p repair, now 3.8 cam; h/o ischemic bowel 08-Oct-2004  . Aortic stenosis    Moderate; Echo 12/09. moderate AS  mean 19. AVA 0.88  . Basal cell carcinoma of nose 1980s  . Bruises easily   . CAD (coronary artery disease)    s/p NSTEMI 1/07 with BMS to OM; cath 12/09 due to positive Myoview, LM ok. LAD 40%. LCX 80-90% mid. RCA chronically occluded with R --> L collats  . Carotid artery stenosis    R 60-79% L 0-39% (6/10) - stable  . CRI (chronic renal insufficiency)    Mild, Cr 1.3  . Depression 10/08/2004   With death of spouse  . Full dentures   . GERD (gastroesophageal reflux disease)   . HTN (hypertension)   . Hyperlipidemia    Increased LFTs on Vytorin. Changed to Lipitor.  . Hyperparathyroidism   . Hypothyroidism   . Incontinence   . Myocardial infarction ([place]) 10-08-2005  . PAD (peripheral artery disease) (HCC)    s/p iliac stents  . Renal artery stenosis (HCC)    Atrophic L kidney. Moderate blockage on R  . Ventral hernia    Moderate sized  . Vitamin D deficiency    Past Surgical History:  Procedure Laterality Date  . ABDOMINAL AORTIC ANEURYSM REPAIR  10/08/1993, 07/01/2005  . ANGIOPLASTY  10-09-2003 - 07/01/2005  . APPENDECTOMY  1954  . breast biospy  1996  . BREAST SURGERY    . CATARACT EXTRACTION, BILATERAL    . CHOLECYSTECTOMY  07/01/2005  . CHOLECYSTECTOMY  1987  . COLONOSCOPY  07/07/2005   Colonic erosions, ulcers proctitis  . COLONOSCOPY W/ ENDOSCOPIC [place]  07/01/2005   [place] kidney diffuse atrophy  .  CORONARY STENT PLACEMENT    . CORONARY STENT PLACEMENT  07/2005   MI   . HERNIA REPAIR  1997  . HOSP  04/2005   ? abd. prob. mesenteric ischemia  . HOSP  01/2006   Hosp for reflux (chest pain)  . ILIAC ARTERY STENT  2005 - 07/01/2005   Stent placement, both legs - PVD in arteries  . NSTEMI  08/13/2005   1/07 s/p stent in circumflex  . TONSILLECTOMY  1943  . UPPER GASTROINTESTINAL ENDOSCOPY  07/07/2005   Nml    Allergies  Allergen Reactions  . Fish Oil Other (See Comments)    Severe indigestion  . Naproxen Nausea And Vomiting  . Tramadol Nausea And Vomiting  . Codeine Nausea And  Vomiting and Rash  . Morphine Sulfate Nausea And Vomiting and Rash  . Sulfonamide Derivatives Nausea And Vomiting and Rash    Allergies as of 07/21/2017      Reactions   Fish Oil Other (See Comments)   Severe indigestion   Naproxen Nausea And Vomiting   Tramadol Nausea And Vomiting   Codeine Nausea And Vomiting, Rash   Morphine Sulfate Nausea And Vomiting, Rash   Sulfonamide Derivatives Nausea And Vomiting, Rash      Medication List        Accurate as of 07/21/17 11:59 PM. Always use your most recent med list.          acetaminophen 500 MG tablet Commonly known as:  TYLENOL Take 1,000 mg by mouth 2 (two) times daily as needed for pain.   aspirin 325 MG tablet Take 325 mg by mouth 2 (two) times daily.   atorvastatin 80 MG tablet Commonly known as:  LIPITOR Take 1 tablet (80 mg total) by mouth daily.   Cholecalciferol 1000 units capsule Take 3,000 Units by mouth daily.   clopidogrel 75 MG tablet Commonly known as:  PLAVIX Take 1 tablet (75 mg total) by mouth daily.   ESTRACE VAGINAL 0.1 MG/GM vaginal cream Generic drug:  estradiol Place 2 g vaginally 2 (two) times a week. Thursday and Sunday   isosorbide mononitrate 30 MG 24 hr tablet Commonly known as:  IMDUR TAKE 1 TABLET TWICE A DAY   levothyroxine 112 MCG tablet Commonly known as:  SYNTHROID, LEVOTHROID TAKE 1 TABLET DAILY   metoprolol tartrate 25 MG tablet Commonly known as:  LOPRESSOR TAKE ONE-HALF (1/2) TABLET TWICE A DAY   sertraline 50 MG tablet Commonly known as:  ZOLOFT TAKE 1 TABLET (50 MG TOTAL) BY MOUTH DAILY.   traMADol 50 MG tablet Commonly known as:  ULTRAM Take 1 tablet (50 mg total) by mouth every 6 (six) hours as needed for moderate pain.   ULORIC 80 MG Tabs Generic drug:  Febuxostat Take 80 mg by mouth daily.       Review of Systems  Constitutional: Negative for activity change, appetite change, chills, diaphoresis and fever.  HENT: Negative for congestion, mouth sores,  nosebleeds, postnasal drip, sneezing, sore throat, trouble swallowing and voice change.   Respiratory: Negative for apnea, cough, choking, chest tightness, shortness of breath and wheezing.   Cardiovascular: Negative for chest pain, palpitations and leg swelling.  Gastrointestinal: Negative for abdominal distention, abdominal pain, constipation, diarrhea and nausea.  Genitourinary: Negative for difficulty urinating, dysuria, frequency and urgency.  Musculoskeletal: Positive for arthralgias (typical arthritis). Negative for back pain, gait problem and myalgias.  Skin: Negative for color change, pallor, rash and wound.  Neurological: Negative for dizziness, tremors, syncope, speech difficulty, weakness,  numbness and headaches.  Psychiatric/Behavioral: Negative for agitation and behavioral problems.  All other systems reviewed and are negative.   Immunization History  Administered Date(s) Administered  . Influenza Split 06/18/2011, 06/14/2012, 04/06/2013  . Influenza Whole 05/12/2006, 04/08/2009, 07/03/2010  . Influenza,inj,Quad PF,6+ Mos 04/02/2014, 04/24/2015, 04/12/2016, 04/18/2017  . Pneumococcal Conjugate-13 12/13/2014  . Pneumococcal Polysaccharide-23 04/11/2005, 05/12/2006  . Td 07/08/2009  . Zoster 07/08/2009   Pertinent  Health Maintenance Due  Topic Date Due  . DEXA SCAN  10/27/1994  . INFLUENZA VACCINE  Completed  . PNA vac Low Risk Adult  Completed   Fall Risk  06/24/2016 12/13/2014 06/26/2013  Falls in the past year? No No No   Functional Status Survey:    Vitals:   07/21/17 1145  BP: 138/68  Pulse: 75  Resp: 20  Temp: 97.8 F (36.6 C)  TempSrc: Oral  SpO2: 97%  Weight: 180 lb 6.4 oz (81.8 kg)  Height: '5\' 6"'  (1.676 m)   Body mass index is 29.12 kg/m. Physical Exam  Constitutional: She is oriented to person, place, and time. Vital signs are normal. She appears well-developed and well-nourished. She is active and cooperative. She does not appear ill. No  distress.  HENT:  Head: Normocephalic and atraumatic.  Mouth/Throat: Uvula is midline, oropharynx is clear and moist and mucous membranes are normal. Mucous membranes are not pale, not dry and not cyanotic.  Eyes: Conjunctivae, EOM and lids are normal. Pupils are equal, round, and reactive to light.  Neck: Trachea normal, normal range of motion and full passive range of motion without pain. Neck supple. No JVD present. No tracheal deviation, no edema and no erythema present. No thyromegaly present.  Cardiovascular: Normal rate, intact distal pulses and normal pulses. An irregular rhythm present. Exam reveals no gallop, no distant heart sounds and no friction rub.  Murmur heard. Pulses:      Dorsalis pedis pulses are 2+ on the right side, and 2+ on the left side.  No edema  Pulmonary/Chest: Effort normal and breath sounds normal. No accessory muscle usage. No respiratory distress. She has no decreased breath sounds. She has no wheezes. She has no rhonchi. She has no rales. She exhibits no tenderness.  Abdominal: Soft. Normal appearance and bowel sounds are normal. She exhibits no distension and no ascites. There is no tenderness.  Musculoskeletal: She exhibits no edema.       Right ankle: She exhibits decreased range of motion. Tenderness.       Left ankle: She exhibits decreased range of motion. Tenderness.  Expected osteoarthritis, stiffness; Bilateral Calves soft, supple. Negative Homan's Sign. B- pedal pulses equal; B-ankle fractures; cap refills WNL  Neurological: She is alert and oriented to person, place, and time. She has normal strength. Coordination and gait abnormal.  Skin: Skin is warm, dry and intact. She is not diaphoretic. No cyanosis. No pallor. Nails show no clubbing.  Psychiatric: She has a normal mood and affect. Her speech is normal and behavior is normal. Judgment and thought content normal. Cognition and memory are normal.  Nursing note and vitals reviewed.   Labs  reviewed: Recent Labs    06/28/17 2000 06/29/17 0332  NA 140 139  K 3.9 3.8  CL 104 104  CO2 26 27  GLUCOSE 98 102*  BUN 24* 24*  CREATININE 1.42* 1.42*  CALCIUM 9.4 9.0   No results for input(s): AST, ALT, ALKPHOS, BILITOT, PROT, ALBUMIN in the last 8760 hours. Recent Labs    06/28/17 2000 06/29/17  0332  WBC 11.0 9.2  HGB 13.7 12.6  HCT 40.6 37.5  MCV 87.7 88.4  PLT 142* 125*   Lab Results  Component Value Date   TSH 0.76 02/27/2016   No results found for: HGBA1C Lab Results  Component Value Date   CHOL 151 06/21/2016   HDL 35.80 (L) 06/21/2016   LDLCALC 86 11/20/2014   LDLDIRECT 29.0 06/21/2016   TRIG 234.0 (H) 06/21/2016   CHOLHDL 4 06/21/2016    Significant Diagnostic Results in last 30 days:  Dg Ankle Complete Left  Result Date: 06/28/2017 CLINICAL DATA:  Fall with foot and ankle pain EXAM: LEFT ANKLE COMPLETE - 3+ VIEW COMPARISON:  None. FINDINGS: Lateral soft tissue swelling. Possible avulsion off the dorsal anterior cortex of the talus. Large amount of dorsal soft tissue swelling. There may be additional small avulsion off the anterior, dorsal navicular. IMPRESSION: Large amount of dorsal soft tissue swelling and lateral soft tissue swelling. Possible cortical avulsion injuries involving the dorsal anterior talus and dorsal anterior navicular. Electronically Signed   By: Donavan Foil M.D.   On: 06/28/2017 17:19   Dg Ankle Complete Right  Result Date: 06/28/2017 CLINICAL DATA:  Fall with foot and ankle pain and swelling EXAM: RIGHT ANKLE - COMPLETE 3+ VIEW COMPARISON:  None. FINDINGS: Large amount of lateral soft tissue swelling. Acute fracture involving the distal metaphysis and shaft of the fibula with about 1/4 shaft diameter of lateral displacement of distal fracture fragment. Ankle mortise is symmetric. IMPRESSION: Large amount of soft tissue swelling with acute, mildly displaced distal fibular fracture. Electronically Signed   By: Donavan Foil M.D.    On: 06/28/2017 17:14   Dg Foot Complete Left  Result Date: 06/28/2017 CLINICAL DATA:  Fall with pain and swelling EXAM: LEFT FOOT - COMPLETE 3+ VIEW COMPARISON:  None. FINDINGS: No subluxation is evident. Acute, mildly comminuted fracture involving the base of the fifth metatarsal with about 3 mm separation of fracture fragment from parent bone. IMPRESSION: Acute, slightly comminuted and displaced fracture involving the base of the fifth metatarsal. Electronically Signed   By: Donavan Foil M.D.   On: 06/28/2017 17:17   Dg Foot Complete Right  Result Date: 06/28/2017 CLINICAL DATA:  Fall with foot pain and swelling EXAM: RIGHT FOOT COMPLETE - 3+ VIEW COMPARISON:  None. FINDINGS: Partially visualized distal fibular fracture. Possible small avulsion fracture at the dorsal anterior aspect of the talus bone. No malalignment. Degenerative changes at the first MTP joint IMPRESSION: 1. Distal fibular fracture 2. Possible avulsion fracture involving the dorsal, anterior aspect of the talus Electronically Signed   By: Donavan Foil M.D.   On: 06/28/2017 17:15    Assessment/Plan 1. Closed fracture of left ankle with routine healing, subsequent encounter 2. Closed fracture of distal end of right fibula with routine healing, unspecified fracture morphology, subsequent encounter  Continue working with PT/OT  Continue exercises as taught by PT/OT  Skin care per protocol  Ice pack prn for pain/edema  Elevated legs when at rest  Continue Tramadol 50 mg po Q 6 hours prn  Continue Tylenol 1,000 mg po BID prn pain  Follow up with Orthopedist as instructed  Family/ staff Communication:   Total Time:  Documentation:  Face to Face:  Family/Phone:   Labs/tests ordered:  Cbc, met c  Medication list reviewed and assessed for continued appropriateness. Monthly medication orders reviewed and signed.  Vikki Ports, NP-C Geriatrics [facility] [phone]  [place], [place] [address] Cell Phone (Mon-Fri 8am-5pm):  [phone] On Call:  [phone] & follow prompts after 5pm & weekends Office Phone:  [phone] Office Fax:  [phone]

## 2017-08-02 ENCOUNTER — Other Ambulatory Visit
Admission: RE | Admit: 2017-08-02 | Discharge: 2017-08-02 | Disposition: A | Discharge status: Home / Self Care | Payer: Medicare Other | Source: Ambulatory Visit | Attending: Gerontology | Admitting: Gerontology

## 2017-08-02 DIAGNOSIS — I129 Hypertensive chronic kidney disease with stage 1 through stage 4 chronic kidney disease, or unspecified chronic kidney disease: Secondary | ICD-10-CM | POA: Diagnosis present

## 2017-08-02 DIAGNOSIS — N189 Chronic kidney disease, unspecified: Secondary | ICD-10-CM | POA: Diagnosis present

## 2017-08-02 LAB — CBC WITH DIFFERENTIAL/PLATELET
Basophils Absolute: 0 10*3/uL (ref 0–0.1)
Basophils Relative: 1 %
Eosinophils Absolute: 0.3 10*3/uL (ref 0–0.7)
Eosinophils Relative: 4 %
HEMATOCRIT: 36.6 % (ref 35.0–47.0)
HEMOGLOBIN: 12.2 g/dL (ref 12.0–16.0)
LYMPHS ABS: 1.8 10*3/uL (ref 1.0–3.6)
Lymphocytes Relative: 26 %
MCH: 29.6 pg (ref 26.0–34.0)
MCHC: 33.4 g/dL (ref 32.0–36.0)
MCV: 88.7 fL (ref 80.0–100.0)
MONOS PCT: 10 %
Monocytes Absolute: 0.7 10*3/uL (ref 0.2–0.9)
NEUTROS ABS: 4.2 10*3/uL (ref 1.4–6.5)
NEUTROS PCT: 59 %
Platelets: 152 10*3/uL (ref 150–440)
RBC: 4.13 MIL/uL (ref 3.80–5.20)
RDW: 16.6 % — ABNORMAL HIGH (ref 11.5–14.5)
WBC: 7 10*3/uL (ref 3.6–11.0)

## 2017-08-02 LAB — COMPREHENSIVE METABOLIC PANEL
ALK PHOS: 65 U/L (ref 38–126)
ALT: 12 U/L — ABNORMAL LOW (ref 14–54)
ANION GAP: 9 (ref 5–15)
AST: 15 U/L (ref 15–41)
Albumin: 3.1 g/dL — ABNORMAL LOW (ref 3.5–5.0)
BILIRUBIN TOTAL: 0.8 mg/dL (ref 0.3–1.2)
BUN: 32 mg/dL — ABNORMAL HIGH (ref 6–20)
CALCIUM: 9.1 mg/dL (ref 8.9–10.3)
CO2: 24 mmol/L (ref 22–32)
Chloride: 108 mmol/L (ref 101–111)
Creatinine, Ser: 1.38 mg/dL — ABNORMAL HIGH (ref 0.44–1.00)
GFR calc non Af Amer: 33 mL/min — ABNORMAL LOW (ref >60)
GFR, EST AFRICAN AMERICAN: 39 mL/min — AB (ref >60)
GLUCOSE: 87 mg/dL (ref 65–99)
Potassium: 4.3 mmol/L (ref 3.5–5.1)
Sodium: 141 mmol/L (ref 135–145)
TOTAL PROTEIN: 5.7 g/dL — AB (ref 6.5–8.1)

## 2017-08-04 ENCOUNTER — Encounter: Payer: Self-pay | Admitting: Gerontology

## 2017-08-04 ENCOUNTER — Non-Acute Institutional Stay (SKILLED_NURSING_FACILITY): Payer: Medicare Other | Admitting: Gerontology

## 2017-08-04 DIAGNOSIS — E46 Unspecified protein-calorie malnutrition: Secondary | ICD-10-CM | POA: Diagnosis not present

## 2017-08-04 DIAGNOSIS — S82831D Other fracture of upper and lower end of right fibula, subsequent encounter for closed fracture with routine healing: Secondary | ICD-10-CM | POA: Diagnosis not present

## 2017-08-04 DIAGNOSIS — S82892D Other fracture of left lower leg, subsequent encounter for closed fracture with routine healing: Secondary | ICD-10-CM

## 2017-08-04 NOTE — Progress Notes (Signed)
Location:   [facility] Room Number: [place] of Service:  SNF [phone]) Provider:  Toni Arthurs, NP-C  Jinny Sanders, MD  Patient Care Team: Jinny Sanders, MD as PCP - General (Family Medicine) Minna Merritts, MD (Cardiology) [name], Shaune Pascal, MD (Cardiology)  Extended Emergency Contact Information Primary Emergency Contact: [name] Address: [place], [place] Phone: [phone] Mobile Phone: [phone] Relation: Daughter Secondary Emergency Contact: Freddy Finner of [place] Phone: [phone] Mobile Phone: [phone] Relation: Son  Code Status:  FULL Goals of care: Advanced Directive information Advanced Directives 08/04/2017  Does Patient Have a Medical Advance Directive? No  Type of Advance Directive -  Does patient want to make changes to medical advance directive? -  Copy of [place] in Chart? -  Would patient like information on creating a medical advance directive? No - Patient declined     Chief Complaint  Patient presents with  . Medical Management of Chronic Issues    Routine Visit    HPI:  Pt is a 84 y.o. female seen today for medical management of chronic diseases. Pt was admitted to the facility for rehab following admission to [facility] for B-ankle fractures s/p fall. Pt has been participating in PT/OT. She is progressing well. Pt reports she is feeling well and that her pain is well controlled on current regimen. She c/o it only hurts when she is and the legs are in the dependent position. Camboot in place on the Right ankle. Cast shoe on the Left foot. Pt reports her appetite is good, but does have low albumin and low protein on labs. She is voiding without difficulty and having regular BMs. VSS. No other complaints.      Past Medical History:  Diagnosis Date  . AAA (abdominal aortic aneurysm) (HCC)    Recurrent; s/p  repair, now 3.8 cam; h/o ischemic bowel 10-17-2004  . Aortic stenosis    Moderate; Echo 12/09. moderate AS mean 19. AVA 0.88  . Basal cell carcinoma of nose 1980s  . Bruises easily   . CAD (coronary artery disease)    s/p NSTEMI 1/07 with BMS to OM; cath 12/09 due to positive Myoview, LM ok. LAD 40%. LCX 80-90% mid. RCA chronically occluded with R --> L collats  . Carotid artery stenosis    R 60-79% L 0-39% (6/10) - stable  . CRI (chronic renal insufficiency)    Mild, Cr 1.3  . Depression 2004-10-17   With death of spouse  . Full dentures   . GERD (gastroesophageal reflux disease)   . HTN (hypertension)   . Hyperlipidemia    Increased LFTs on Vytorin. Changed to Lipitor.  . Hyperparathyroidism   . Hypothyroidism   . Incontinence   . Myocardial infarction ([place]) 10/17/05  . PAD (peripheral artery disease) (HCC)    s/p iliac stents  . Renal artery stenosis (HCC)    Atrophic L kidney. Moderate blockage on R  . Ventral hernia    Moderate sized  . Vitamin D deficiency    Past Surgical History:  Procedure Laterality Date  . ABDOMINAL AORTIC ANEURYSM REPAIR  17-Oct-1993, 07/01/2005  . ANGIOPLASTY  10-18-03 - 07/01/2005  . APPENDECTOMY  1954  . breast biospy  1996  . BREAST SURGERY    . CATARACT EXTRACTION, BILATERAL    . CHOLECYSTECTOMY  07/01/2005  . CHOLECYSTECTOMY  1987  . COLONOSCOPY  07/07/2005   Colonic erosions, ulcers proctitis  . COLONOSCOPY W/ ENDOSCOPIC [place]  07/01/2005   [place] kidney diffuse atrophy  . CORONARY STENT PLACEMENT    . CORONARY STENT PLACEMENT  07/2005   MI   . HERNIA REPAIR  1997  . HOSP  04/2005   ? abd. prob. mesenteric ischemia  . HOSP  01/2006   Hosp for reflux (chest pain)  . ILIAC ARTERY STENT  2005 - 07/01/2005   Stent placement, both legs - PVD in arteries  . NSTEMI  08/13/2005   1/07 s/p stent in circumflex  . TONSILLECTOMY  1943  . UPPER GASTROINTESTINAL ENDOSCOPY  07/07/2005   Nml    Allergies  Allergen Reactions  . Fish Oil Other (See Comments)     Severe indigestion  . Naproxen Nausea And Vomiting  . Tramadol Nausea And Vomiting  . Codeine Nausea And Vomiting and Rash  . Morphine Sulfate Nausea And Vomiting and Rash  . Sulfonamide Derivatives Nausea And Vomiting and Rash    Allergies as of 08/04/2017      Reactions   Fish Oil Other (See Comments)   Severe indigestion   Naproxen Nausea And Vomiting   Tramadol Nausea And Vomiting   Codeine Nausea And Vomiting, Rash   Morphine Sulfate Nausea And Vomiting, Rash   Sulfonamide Derivatives Nausea And Vomiting, Rash      Medication List        Accurate as of 08/04/17  1:20 PM. Always use your most recent med list.          acetaminophen 500 MG tablet Commonly known as:  TYLENOL Take 1,000 mg by mouth 2 (two) times daily as needed for pain.   aspirin 325 MG tablet Take 325 mg by mouth 2 (two) times daily.   atorvastatin 80 MG tablet Commonly known as:  LIPITOR Take 1 tablet (80 mg total) by mouth daily.   Cholecalciferol 1000 units capsule Take 3,000 Units by mouth daily.   clopidogrel 75 MG tablet Commonly known as:  PLAVIX Take 1 tablet (75 mg total) by mouth daily.   ESTRACE VAGINAL 0.1 MG/GM vaginal cream Generic drug:  estradiol Place 2 g vaginally 2 (two) times a week. Thursday and Sunday   feeding supplement (PRO-STAT SUGAR FREE 64) Liqd Take 30 mLs by mouth 2 (two) times daily between meals.   isosorbide mononitrate 30 MG 24 hr tablet Commonly known as:  IMDUR TAKE 1 TABLET TWICE A DAY   levothyroxine 112 MCG tablet Commonly known as:  SYNTHROID, LEVOTHROID TAKE 1 TABLET DAILY   metoprolol tartrate 25 MG tablet Commonly known as:  LOPRESSOR TAKE ONE-HALF (1/2) TABLET TWICE A DAY   sertraline 50 MG tablet Commonly known as:  ZOLOFT TAKE 1 TABLET (50 MG TOTAL) BY MOUTH DAILY.   traMADol 50 MG tablet Commonly known as:  ULTRAM Take 1 tablet (50 mg total) by mouth every 6 (six) hours as needed for moderate pain.   ULORIC 80 MG Tabs Generic  drug:  Febuxostat Take 80 mg by mouth daily.       Review of Systems  Constitutional: Negative for activity change, appetite change, chills, diaphoresis and fever.  HENT: Negative for congestion, mouth sores, nosebleeds, postnasal drip, sneezing, sore throat, trouble swallowing and voice change.   Respiratory: Negative for apnea, cough, choking, chest tightness, shortness of breath and wheezing.   Cardiovascular: Negative for chest pain, palpitations and leg swelling.  Gastrointestinal: Negative for abdominal distention, abdominal  pain, constipation, diarrhea and nausea.  Genitourinary: Negative for difficulty urinating, dysuria, frequency and urgency.  Musculoskeletal: Positive for arthralgias (typical arthritis) and gait problem. Negative for back pain and myalgias.  Skin: Negative for color change, pallor, rash and wound.  Neurological: Negative for dizziness, tremors, syncope, speech difficulty, weakness, numbness and headaches.  Psychiatric/Behavioral: Negative for agitation and behavioral problems.  All other systems reviewed and are negative.   Immunization History  Administered Date(s) Administered  . Influenza Split 06/18/2011, 06/14/2012, 04/06/2013  . Influenza Whole 05/12/2006, 04/08/2009, 07/03/2010  . Influenza,inj,Quad PF,6+ Mos 04/02/2014, 04/24/2015, 04/12/2016, 04/18/2017  . Pneumococcal Conjugate-13 12/13/2014  . Pneumococcal Polysaccharide-23 04/11/2005, 05/12/2006  . Td 07/08/2009  . Zoster 07/08/2009   Pertinent  Health Maintenance Due  Topic Date Due  . DEXA SCAN  10/27/1994  . INFLUENZA VACCINE  Completed  . PNA vac Low Risk Adult  Completed   Fall Risk  06/24/2016 12/13/2014 06/26/2013  Falls in the past year? No No No   Functional Status Survey:    Vitals:   08/04/17 1312  BP: 130/71  Pulse: 75  Resp: 20  Temp: 97.6 F (36.4 C)  TempSrc: Oral  SpO2: 98%  Weight: 187 lb 6.4 oz (85 kg)  Height: 5\' 6"  (1.676 m)   Body mass index is 30.25  kg/m. Physical Exam  Constitutional: She is oriented to person, place, and time. Vital signs are normal. She appears well-developed and well-nourished. She is active and cooperative. She does not appear ill. No distress.  HENT:  Head: Normocephalic and atraumatic.  Mouth/Throat: Uvula is midline, oropharynx is clear and moist and mucous membranes are normal. Mucous membranes are not pale, not dry and not cyanotic.  Eyes: Conjunctivae, EOM and lids are normal. Pupils are equal, round, and reactive to light.  Neck: Trachea normal, normal range of motion and full passive range of motion without pain. Neck supple. No JVD present. No tracheal deviation, no edema and no erythema present. No thyromegaly present.  Cardiovascular: Normal rate, regular rhythm, normal heart sounds, intact distal pulses and normal pulses. Exam reveals no gallop, no distant heart sounds and no friction rub.  No murmur heard. Pulses:      Dorsalis pedis pulses are 2+ on the right side, and 2+ on the left side.  No edema  Pulmonary/Chest: Effort normal and breath sounds normal. No accessory muscle usage. No respiratory distress. She has no decreased breath sounds. She has no wheezes. She has no rhonchi. She has no rales. She exhibits no tenderness.  Abdominal: Soft. Normal appearance and bowel sounds are normal. She exhibits no distension and no ascites. There is no tenderness.  Musculoskeletal: She exhibits no edema.       Right ankle: She exhibits decreased range of motion. Tenderness.       Left ankle: She exhibits decreased range of motion. Tenderness.  Expected osteoarthritis, stiffness; Bilateral Calves soft, supple. Negative Homan's Sign. B- pedal pulses equal  Neurological: She is alert and oriented to person, place, and time. She has normal strength. Gait abnormal.  Skin: Skin is warm, dry and intact. She is not diaphoretic. No cyanosis. No pallor. Nails show no clubbing.  Psychiatric: She has a normal mood and  affect. Her speech is normal and behavior is normal. Judgment and thought content normal. Cognition and memory are normal.  Nursing note and vitals reviewed.   Labs reviewed: Recent Labs    06/28/17 2000 06/29/17 0332 08/02/17 0426  NA 140 139 141  K 3.9 3.8  4.3  CL 104 104 108  CO2 26 27 24   GLUCOSE 98 102* 87  BUN 24* 24* 32*  CREATININE 1.42* 1.42* 1.38*  CALCIUM 9.4 9.0 9.1   Recent Labs    08/02/17 0426  AST 15  ALT 12*  ALKPHOS 65  BILITOT 0.8  PROT 5.7*  ALBUMIN 3.1*   Recent Labs    06/28/17 2000 06/29/17 0332 08/02/17 0426  WBC 11.0 9.2 7.0  NEUTROABS  --   --  4.2  HGB 13.7 12.6 12.2  HCT 40.6 37.5 36.6  MCV 87.7 88.4 88.7  PLT 142* 125* 152   Lab Results  Component Value Date   TSH 0.76 02/27/2016   No results found for: HGBA1C Lab Results  Component Value Date   CHOL 151 06/21/2016   HDL 35.80 (L) 06/21/2016   LDLCALC 86 11/20/2014   LDLDIRECT 29.0 06/21/2016   TRIG 234.0 (H) 06/21/2016   CHOLHDL 4 06/21/2016    Significant Diagnostic Results in last 30 days:  No results found.  Assessment/Plan Closed fracture of left ankle with routine healing, subsequent encounter Closed fracture of distal end of right fibula with routine healing, unspecified fracture morphology, subsequent encounter  Continue PT/OT  Continue exercises as taught by PT/OT  Continue to wear the camboot on the right/ castshoe on the left  Weightbearing as instructed by ortho  Skin care per protocol  Continue Tramadol 50 mg po Q 6 hours prn pain  Continue Tylenol 1000 mg po BID prn  Continue ASA 325 mg po BID for DVT prophylaxis  Protein-calorie malnutrition, unspecified severity (HCC)  Continue Ensure Enlive 1 bottle po BID  Continue Pro-stat 30 ml PO BID for low protein, low albumin, wound healing   Family/ staff Communication:   Total Time:  Documentation:  Face to Face:  Family/Phone:   Labs/tests ordered:    Medication list reviewed and  assessed for continued appropriateness. Monthly medication orders reviewed and signed.  Vikki Ports, NP-C Geriatrics [facility] [phone], [place] [address] Cell Phone (Mon-Fri 8am-5pm):  [phone] On Call:  [phone] & follow prompts after 5pm & weekends Office Phone:  [phone] Office Fax:  [phone]

## 2017-08-11 ENCOUNTER — Non-Acute Institutional Stay (SKILLED_NURSING_FACILITY): Payer: Medicare Other | Admitting: Gerontology

## 2017-08-11 DIAGNOSIS — S82831D Other fracture of upper and lower end of right fibula, subsequent encounter for closed fracture with routine healing: Secondary | ICD-10-CM | POA: Diagnosis not present

## 2017-08-11 DIAGNOSIS — S82892D Other fracture of left lower leg, subsequent encounter for closed fracture with routine healing: Secondary | ICD-10-CM

## 2017-08-11 DIAGNOSIS — E46 Unspecified protein-calorie malnutrition: Secondary | ICD-10-CM | POA: Diagnosis not present

## 2017-08-12 ENCOUNTER — Encounter
Admission: RE | Admit: 2017-08-12 | Discharge: 2017-08-12 | Disposition: A | Discharge status: Home / Self Care | Payer: Medicare Other | Source: Ambulatory Visit | Attending: Internal Medicine | Admitting: Internal Medicine

## 2017-08-12 ENCOUNTER — Encounter: Payer: Self-pay | Admitting: Gerontology

## 2017-08-12 NOTE — Progress Notes (Signed)
Location:   [facility] Room Number: [place] of Service:  SNF [phone])  Provider: Toni Arthurs, NP-C  PCP: Jinny Sanders, MD Patient Care Team: Jinny Sanders, MD as PCP - General (Family Medicine) Minna Merritts, MD (Cardiology) [name], Shaune Pascal, MD (Cardiology)  Extended Emergency Contact Information Primary Emergency Contact: [name] Address: [place], [place] Phone: [phone] Mobile Phone: [phone] Relation: Daughter Secondary Emergency Contact: Freddy Finner of [place] Phone: [phone] Mobile Phone: [phone] Relation: Son  Code Status: FULL Goals of care:  Advanced Directive information Advanced Directives 08/12/2017  Does Patient Have a Medical Advance Directive? No  Type of Advance Directive -  Does patient want to make changes to medical advance directive? -  Copy of [place] in Chart? -  Would patient like information on creating a medical advance directive? No - Patient declined     Allergies  Allergen Reactions  . Fish Oil Other (See Comments)    Severe indigestion  . Naproxen Nausea And Vomiting  . Tramadol Nausea And Vomiting  . Codeine Nausea And Vomiting and Rash  . Morphine Sulfate Nausea And Vomiting and Rash  . Sulfonamide Derivatives Nausea And Vomiting and Rash    Chief Complaint  Patient presents with  . Discharge Note    Discharged from SNF    HPI:  84 y.o. female seen today for discharge evaluation. Pt was admitted to the facility for rehab after admission to [facility] for fall with B- ankle fractures. Pt has been participating in PT/OT. Pt has been progressing well. During recent visit to Ortho, the camboot and castshoe were D/C'ed. Pt is WBAT. Pt reports she is doing well, pain is well controlled, feeling more stable and is ready to go home. She reports she is eating well and continues on her  supplements. Voiding and having BMs without difficulty. VSS. No other complaints.         Past Medical History:  Diagnosis Date  . AAA (abdominal aortic aneurysm) (HCC)    Recurrent; s/p repair, now 3.8 cam; h/o ischemic bowel 2004/09/20  . Aortic stenosis    Moderate; Echo 12/09. moderate AS mean 19. AVA 0.88  . Basal cell carcinoma of nose 1980s  . Bruises easily   . CAD (coronary artery disease)    s/p NSTEMI 1/07 with BMS to OM; cath 12/09 due to positive Myoview, LM ok. LAD 40%. LCX 80-90% mid. RCA chronically occluded with R --> L collats  . Carotid artery stenosis    R 60-79% L 0-39% (6/10) - stable  . CRI (chronic renal insufficiency)    Mild, Cr 1.3  . Depression September 20, 2004   With death of spouse  . Full dentures   . GERD (gastroesophageal reflux disease)   . HTN (hypertension)   . Hyperlipidemia    Increased LFTs on Vytorin. Changed to Lipitor.  . Hyperparathyroidism   . Hypothyroidism   . Incontinence   . Myocardial infarction ([place]) 20-Sep-2005  . PAD (peripheral artery disease) (HCC)    s/p iliac stents  . Renal artery stenosis (HCC)    Atrophic L kidney. Moderate blockage on R  . Ventral hernia    Moderate sized  . Vitamin D deficiency     Past Surgical History:  Procedure Laterality Date  . ABDOMINAL AORTIC ANEURYSM REPAIR  09-20-1993, 07/01/2005  . ANGIOPLASTY  21-Sep-2003 -  07/01/2005  . APPENDECTOMY  1954  . breast biospy  1996  . BREAST SURGERY    . CATARACT EXTRACTION, BILATERAL    . CHOLECYSTECTOMY  07/01/2005  . CHOLECYSTECTOMY  1987  . COLONOSCOPY  07/07/2005   Colonic erosions, ulcers proctitis  . COLONOSCOPY W/ ENDOSCOPIC [place]  07/01/2005   [place] kidney diffuse atrophy  . CORONARY STENT PLACEMENT    . CORONARY STENT PLACEMENT  07/2005   MI   . HERNIA REPAIR  1997  . HOSP  05-01-2005   ? abd. prob. mesenteric ischemia  . HOSP  01/2006   Hosp for reflux (chest pain)  . ILIAC ARTERY STENT  2005 - 07/01/2005   Stent placement, both legs - PVD in arteries  . NSTEMI   08/13/2005   1/07 s/p stent in circumflex  . TONSILLECTOMY  1943  . UPPER GASTROINTESTINAL ENDOSCOPY  07/07/2005   Nml      reports that she quit smoking about 12 years ago. she has never used smokeless tobacco. She reports that she does not drink alcohol or use drugs. Social History   Socioeconomic History  . Marital status: Widowed    Spouse name: Not on file  . Number of children: Not on file  . Years of education: Not on file  . Highest education level: Not on file  Social Needs  . Financial resource strain: Not on file  . Food insecurity - worry: Not on file  . Food insecurity - inability: Not on file  . Transportation needs - medical: Not on file  . Transportation needs - non-medical: Not on file  Occupational History  . Not on file  Tobacco Use  . Smoking status: Former Smoker    Last attempt to quit: 07/12/2005    Years since quitting: 12.0  . Smokeless tobacco: Never Used  . Tobacco comment: quit in 1997  Substance and Sexual Activity  . Alcohol use: No  . Drug use: No  . Sexual activity: Not on file  Other Topics Concern  . Not on file  Social History Narrative   Husband deceased 2005-05-01 from multiple myeloma, lives alone near sister..    Has living will, HCPOA: daughter: [name]   Full code. Has lifealert. (Reviewed 2014)         Functional Status Survey:    Allergies  Allergen Reactions  . Fish Oil Other (See Comments)    Severe indigestion  . Naproxen Nausea And Vomiting  . Tramadol Nausea And Vomiting  . Codeine Nausea And Vomiting and Rash  . Morphine Sulfate Nausea And Vomiting and Rash  . Sulfonamide Derivatives Nausea And Vomiting and Rash    Pertinent  Health Maintenance Due  Topic Date Due  . DEXA SCAN  10/27/1994  . INFLUENZA VACCINE  Completed  . PNA vac Low Risk Adult  Completed    Medications: Allergies as of 08/11/2017      Reactions   Fish Oil Other (See Comments)   Severe indigestion   Naproxen Nausea And Vomiting    Tramadol Nausea And Vomiting   Codeine Nausea And Vomiting, Rash   Morphine Sulfate Nausea And Vomiting, Rash   Sulfonamide Derivatives Nausea And Vomiting, Rash      Medication List        Accurate as of 08/11/17 11:59 PM. Always use your most recent med list.          acetaminophen 500 MG tablet Commonly known as:  TYLENOL Take 1,000 mg by mouth 2 (  two) times daily as needed for pain.   aspirin 325 MG tablet Take 325 mg by mouth 2 (two) times daily.   atorvastatin 80 MG tablet Commonly known as:  LIPITOR Take 1 tablet (80 mg total) by mouth daily.   Cholecalciferol 1000 units capsule Take 3,000 Units by mouth daily.   clopidogrel 75 MG tablet Commonly known as:  PLAVIX Take 1 tablet (75 mg total) by mouth daily.   ESTRACE VAGINAL 0.1 MG/GM vaginal cream Generic drug:  estradiol Place 2 g vaginally 2 (two) times a week. Thursday and Sunday   feeding supplement (PRO-STAT SUGAR FREE 64) Liqd Take 30 mLs by mouth 2 (two) times daily between meals.   isosorbide mononitrate 30 MG 24 hr tablet Commonly known as:  IMDUR TAKE 1 TABLET TWICE A DAY   levothyroxine 112 MCG tablet Commonly known as:  SYNTHROID, LEVOTHROID TAKE 1 TABLET DAILY   metoprolol tartrate 25 MG tablet Commonly known as:  LOPRESSOR TAKE ONE-HALF (1/2) TABLET TWICE A DAY   sertraline 50 MG tablet Commonly known as:  ZOLOFT TAKE 1 TABLET (50 MG TOTAL) BY MOUTH DAILY.   traMADol 50 MG tablet Commonly known as:  ULTRAM Take 1 tablet (50 mg total) by mouth every 6 (six) hours as needed for moderate pain.   ULORIC 80 MG Tabs Generic drug:  Febuxostat Take 80 mg by mouth daily.       Review of Systems  Constitutional: Negative for activity change, appetite change, chills, diaphoresis and fever.  HENT: Negative for congestion, mouth sores, nosebleeds, postnasal drip, sneezing, sore throat, trouble swallowing and voice change.   Respiratory: Negative for apnea, cough, choking, chest tightness,  shortness of breath and wheezing.   Cardiovascular: Negative for chest pain, palpitations and leg swelling.  Gastrointestinal: Negative for abdominal distention, abdominal pain, constipation, diarrhea and nausea.  Genitourinary: Negative for difficulty urinating, dysuria, frequency and urgency.  Musculoskeletal: Positive for arthralgias (typical arthritis) and gait problem. Negative for back pain and myalgias.  Skin: Negative for color change, pallor, rash and wound.  Neurological: Negative for dizziness, tremors, syncope, speech difficulty, weakness, numbness and headaches.  Psychiatric/Behavioral: Negative for agitation and behavioral problems.  All other systems reviewed and are negative.   Vitals:   08/11/17 1450  BP: 123/79  Pulse: 84  Resp: 18  Temp: 97.8 F (36.6 C)  TempSrc: Oral  SpO2: 96%  Weight: 181 lb 4.8 oz (82.2 kg)  Height: '5\' 6"'  (1.676 m)   Body mass index is 29.26 kg/m. Physical Exam  Constitutional: She is oriented to person, place, and time. Vital signs are normal. She appears well-developed and well-nourished. She is active and cooperative. She does not appear ill. No distress.  HENT:  Head: Normocephalic and atraumatic.  Mouth/Throat: Uvula is midline, oropharynx is clear and moist and mucous membranes are normal. Mucous membranes are not pale, not dry and not cyanotic.  Eyes: Conjunctivae, EOM and lids are normal. Pupils are equal, round, and reactive to light.  Neck: Trachea normal, normal range of motion and full passive range of motion without pain. Neck supple. No JVD present. No tracheal deviation, no edema and no erythema present. No thyromegaly present.  Cardiovascular: Normal rate, regular rhythm, normal heart sounds, intact distal pulses and normal pulses. Exam reveals no gallop, no distant heart sounds and no friction rub.  No murmur heard. Pulses:      Dorsalis pedis pulses are 2+ on the right side, and 2+ on the left side.  No edema  Pulmonary/Chest: Effort normal and breath sounds normal. No accessory muscle usage. No respiratory distress. She has no decreased breath sounds. She has no wheezes. She has no rhonchi. She has no rales. She exhibits no tenderness.  Abdominal: Soft. Normal appearance and bowel sounds are normal. She exhibits no distension and no ascites. There is no tenderness.  Musculoskeletal: She exhibits no edema.       Right ankle: She exhibits decreased range of motion. Tenderness.       Left ankle: She exhibits decreased range of motion. Tenderness.  Expected osteoarthritis, stiffness; Bilateral Calves soft, supple. Negative Homan's Sign. B- pedal pulses equal  Neurological: She is alert and oriented to person, place, and time. She has normal strength. Coordination abnormal.  Skin: Skin is warm, dry and intact. She is not diaphoretic. No cyanosis. No pallor. Nails show no clubbing.  Psychiatric: She has a normal mood and affect. Her speech is normal and behavior is normal. Judgment and thought content normal. Cognition and memory are normal.  Nursing note and vitals reviewed.   Labs reviewed: Basic Metabolic Panel: Recent Labs    06/28/17 2000 06/29/17 0332 08/02/17 0426  NA 140 139 141  K 3.9 3.8 4.3  CL 104 104 108  CO2 '26 27 24  ' GLUCOSE 98 102* 87  BUN 24* 24* 32*  CREATININE 1.42* 1.42* 1.38*  CALCIUM 9.4 9.0 9.1   Liver Function Tests: Recent Labs    08/02/17 0426  AST 15  ALT 12*  ALKPHOS 65  BILITOT 0.8  PROT 5.7*  ALBUMIN 3.1*   No results for input(s): LIPASE, AMYLASE in the last 8760 hours. No results for input(s): AMMONIA in the last 8760 hours. CBC: Recent Labs    06/28/17 2000 06/29/17 0332 08/02/17 0426  WBC 11.0 9.2 7.0  NEUTROABS  --   --  4.2  HGB 13.7 12.6 12.2  HCT 40.6 37.5 36.6  MCV 87.7 88.4 88.7  PLT 142* 125* 152   Cardiac Enzymes: No results for input(s): CKTOTAL, CKMB, CKMBINDEX, TROPONINI in the last 8760 hours. BNP: Invalid input(s):  POCBNP CBG: No results for input(s): GLUCAP in the last 8760 hours.  Procedures and Imaging Studies During Stay: No results found.  Assessment/Plan:   Closed fracture of left ankle with routine healing, subsequent encounter Closed fracture of distal end of right fibula with routine healing, unspecified fracture morphology, subsequent encounter  Continue PT/OT  Continue exercises as taught by PT/OT  WBAT to BLE  Skin care per protocol  Continue Tylenol 1,000 mg po BID for mild pain  Continue Tramadol 50 mg po Q 6 hours prn pain  Follow up with Orthopedist as instructed for continuity of care  Ambulate with walker  Protein-calorie malnutrition, unspecified severity (HCC)  Continue Ensure Enlive po BID  Continue Pro-stat 30 ml po BID for nutritional support  Follow up with PCP asap after discharge for continuity of care   Patient is being discharged with the following home health services: HHPT/OT through [facility]  Patient is being discharged with the following durable medical equipment: none    Patient has been advised to f/u with their PCP in 1-2 weeks to bring them up to date on their rehab stay.  Social services at facility was responsible for arranging this appointment.  Pt was provided with a 30 day supply of prescriptions for medications and refills must be obtained from their PCP.  For controlled substances, a more limited supply may be provided adequate until PCP appointment only.  Future labs/tests needed:    Family/ staff Communication:   Total Time:  Documentation:  Face to Face:  Family/Phone:  Vikki Ports, NP-C [facility] [address], [place] [address] Cell Phone (Mon-Fri 8am-5pm):  [phone] On Call:  [phone] & follow prompts after 5pm & weekends Office Phone:  [phone] Office Fax:  [phone]

## 2017-08-15 DIAGNOSIS — E46 Unspecified protein-calorie malnutrition: Secondary | ICD-10-CM | POA: Insufficient documentation

## 2017-08-16 ENCOUNTER — Telehealth: Payer: Self-pay | Admitting: Family Medicine

## 2017-08-16 NOTE — Telephone Encounter (Signed)
Copied from [place] [id number]. Topic: Quick Communication - See Telephone Encounter >> Aug 16, 2017  9:10 AM Ahmed Prima L wrote: CRM for notification. See Telephone encounter for:   08/16/17.  Jeani Hawking, physical therapist from [facility] called and needs PT plan of care and frequency & needs clarification on aspirin 325 MG tablet. Patient said she is only suppose to take 81 mg a day and she is doing that at home. Call back is [phone]. They verbal approve to go out to see patient twice a week for two weeks.

## 2017-08-16 NOTE — Telephone Encounter (Signed)
Also okay to given verbal order as requested.

## 2017-08-16 NOTE — Telephone Encounter (Signed)
[name] notified as instructed by telephone.  She states the discharge papers  states Aspirin 325 mg two times a day.  I advised to have her just take it once daily.  Verbal orders also given for PT plan of care and frequency 2 x week for 2 weeks as instructed by Dr. Diona Browner.

## 2017-08-16 NOTE — Telephone Encounter (Signed)
Change to aspirin to 325 mg daily was made at discharge from nursing home to decrase risk of clots I believe. If pt is minimally ambulatory.. Continue asa 325 mg, once she is back to normal activity.. Can decrease back to 81 mg daily.

## 2017-08-19 ENCOUNTER — Telehealth: Payer: Self-pay | Admitting: Family Medicine

## 2017-08-19 NOTE — Telephone Encounter (Signed)
Verbal order given to [facility] for OT 1 x week for 3 weeks.

## 2017-08-19 NOTE — Telephone Encounter (Signed)
Copied from [place]. Topic: Quick Communication - See Telephone Encounter >> Aug 19, 2017  8:08 AM Robina Ade, Helene Kelp D wrote: CRM for notification. See Telephone encounter for: 08/19/17. Miller with [facility] called and would like a verbal order for patient for 1X for 3 weeks for occupational therapy. Her number is [phone].

## 2017-08-24 ENCOUNTER — Telehealth: Payer: Self-pay

## 2017-08-24 NOTE — Telephone Encounter (Signed)
[name] notified that the CT scheduled on 09/26/2017 was ordered by Dr. Oneida Alar [name] vascular doctor.  I advised that was to follow up on [name] thoracic aortic aneurysm.  I confirmed dates and times with [name] of all [name] upcoming appointments with Dr. Diona Browner, Dr. Rockey Situ and Dr. Oneida Alar.   [name] states understanding of all upcoming appointments.

## 2017-08-24 NOTE — Telephone Encounter (Signed)
Copied from [place]. Topic: General - Other >> Aug 24, 2017  9:49 AM Ivar Drape wrote: Reason for CRM:  Patient has an appt on 09/26/17 at 11:00am for a CT Chest WO Contrast.  The patient does not know what that is for, so she needs someone to call her back and explain why or if this is needed.

## 2017-08-26 ENCOUNTER — Ambulatory Visit: Payer: Medicare Other

## 2017-08-26 DIAGNOSIS — Z0279 Encounter for issue of other medical certificate: Secondary | ICD-10-CM | POA: Diagnosis not present

## 2017-08-26 DIAGNOSIS — Z9181 History of falling: Secondary | ICD-10-CM | POA: Diagnosis not present

## 2017-08-26 DIAGNOSIS — M80072D Age-related osteoporosis with current pathological fracture, left ankle and foot, subsequent encounter for fracture with routine healing: Secondary | ICD-10-CM | POA: Diagnosis not present

## 2017-08-26 DIAGNOSIS — I2510 Atherosclerotic heart disease of native coronary artery without angina pectoris: Secondary | ICD-10-CM | POA: Diagnosis not present

## 2017-08-26 DIAGNOSIS — M80061D Age-related osteoporosis with current pathological fracture, right lower leg, subsequent encounter for fracture with routine healing: Secondary | ICD-10-CM | POA: Diagnosis not present

## 2017-08-30 ENCOUNTER — Other Ambulatory Visit: Payer: Medicare Other

## 2017-09-02 ENCOUNTER — Encounter: Payer: Medicare Other | Admitting: Family Medicine

## 2017-09-07 ENCOUNTER — Other Ambulatory Visit: Payer: Self-pay | Admitting: Family Medicine

## 2017-09-08 ENCOUNTER — Encounter (HOSPITAL_COMMUNITY): Payer: Medicare Other

## 2017-09-08 ENCOUNTER — Ambulatory Visit: Payer: Medicare Other | Admitting: Vascular Surgery

## 2017-09-08 ENCOUNTER — Other Ambulatory Visit: Payer: Medicare Other

## 2017-09-15 ENCOUNTER — Telehealth: Payer: Self-pay | Admitting: Family Medicine

## 2017-09-15 DIAGNOSIS — M1A9XX0 Chronic gout, unspecified, without tophus (tophi): Secondary | ICD-10-CM

## 2017-09-15 DIAGNOSIS — E46 Unspecified protein-calorie malnutrition: Secondary | ICD-10-CM

## 2017-09-15 DIAGNOSIS — E039 Hypothyroidism, unspecified: Secondary | ICD-10-CM

## 2017-09-15 DIAGNOSIS — M80072D Age-related osteoporosis with current pathological fracture, left ankle and foot, subsequent encounter for fracture with routine healing: Secondary | ICD-10-CM | POA: Diagnosis not present

## 2017-09-15 DIAGNOSIS — E7800 Pure hypercholesterolemia, unspecified: Secondary | ICD-10-CM

## 2017-09-15 DIAGNOSIS — N183 Chronic kidney disease, stage 3 unspecified: Secondary | ICD-10-CM

## 2017-09-15 DIAGNOSIS — I2510 Atherosclerotic heart disease of native coronary artery without angina pectoris: Secondary | ICD-10-CM | POA: Diagnosis not present

## 2017-09-15 DIAGNOSIS — M80061D Age-related osteoporosis with current pathological fracture, right lower leg, subsequent encounter for fracture with routine healing: Secondary | ICD-10-CM | POA: Diagnosis not present

## 2017-09-15 DIAGNOSIS — Z9181 History of falling: Secondary | ICD-10-CM | POA: Diagnosis not present

## 2017-09-15 NOTE — Telephone Encounter (Signed)
-----   Message from Eustace Pen, LPN sent at 9/0/3833  2:42 PM EST ----- Regarding: Labs 3/8 Lab orders needed. Thank you.  Insurance:  [facility] [employer]

## 2017-09-16 ENCOUNTER — Ambulatory Visit
Admission: RE | Admit: 2017-09-16 | Discharge: 2017-09-16 | Disposition: A | Discharge status: Home / Self Care | Payer: Medicare Other | Source: Ambulatory Visit | Attending: Vascular Surgery | Admitting: Vascular Surgery

## 2017-09-16 DIAGNOSIS — I712 Thoracic aortic aneurysm, without rupture, unspecified: Secondary | ICD-10-CM

## 2017-09-19 ENCOUNTER — Ambulatory Visit (INDEPENDENT_AMBULATORY_CARE_PROVIDER_SITE_OTHER): Payer: Medicare Other

## 2017-09-19 ENCOUNTER — Ambulatory Visit: Payer: Medicare Other

## 2017-09-19 VITALS — BP 122/84 | HR 59 | Temp 97.8°F | Ht 65.5 in | Wt 183.5 lb

## 2017-09-19 DIAGNOSIS — E039 Hypothyroidism, unspecified: Secondary | ICD-10-CM | POA: Diagnosis not present

## 2017-09-19 DIAGNOSIS — E7800 Pure hypercholesterolemia, unspecified: Secondary | ICD-10-CM

## 2017-09-19 DIAGNOSIS — E46 Unspecified protein-calorie malnutrition: Secondary | ICD-10-CM

## 2017-09-19 DIAGNOSIS — Z0000 Encounter for general adult medical examination without abnormal findings: Secondary | ICD-10-CM | POA: Diagnosis not present

## 2017-09-19 DIAGNOSIS — M1A9XX0 Chronic gout, unspecified, without tophus (tophi): Secondary | ICD-10-CM

## 2017-09-19 LAB — LIPID PANEL
Cholesterol: 158 mg/dL (ref 0–200)
HDL: 41.2 mg/dL (ref >39.00)
LDL CALC: 77 mg/dL (ref 0–99)
NONHDL: 116.49
Total CHOL/HDL Ratio: 4
Triglycerides: 198 mg/dL — ABNORMAL HIGH (ref 0.0–149.0)
VLDL: 39.6 mg/dL (ref 0.0–40.0)

## 2017-09-19 LAB — COMPREHENSIVE METABOLIC PANEL
ALT: 10 U/L (ref 0–35)
AST: 11 U/L (ref 0–37)
Albumin: 3.6 g/dL (ref 3.5–5.2)
Alkaline Phosphatase: 69 U/L (ref 39–117)
BUN: 28 mg/dL — AB (ref 6–23)
CALCIUM: 9.6 mg/dL (ref 8.4–10.5)
CHLORIDE: 107 meq/L (ref 96–112)
CO2: 31 meq/L (ref 19–32)
CREATININE: 1.32 mg/dL — AB (ref 0.40–1.20)
GFR: 40.38 mL/min — ABNORMAL LOW (ref >60.00)
Glucose, Bld: 87 mg/dL (ref 70–99)
Potassium: 4.7 mEq/L (ref 3.5–5.1)
SODIUM: 142 meq/L (ref 135–145)
Total Bilirubin: 0.7 mg/dL (ref 0.2–1.2)
Total Protein: 6.3 g/dL (ref 6.0–8.3)

## 2017-09-19 LAB — URIC ACID: Uric Acid, Serum: 3.5 mg/dL (ref 2.4–7.0)

## 2017-09-19 NOTE — Patient Instructions (Signed)
[name] , Thank you for taking time to come for your Medicare Wellness Visit. I appreciate your ongoing commitment to your health goals. Please review the following plan we discussed and let me know if I can assist you in the future.   These are the goals we discussed: Goals    . Follow up with Primary Care Provider     Starting 09/19/2017, I will continue to take medications as prescribed and to keep appointments with PCP as scheduled.        This is a list of the screening recommended for you and due dates:  Health Maintenance  Topic Date Due  . DEXA scan (bone density measurement)  10/10/2018*  . Tetanus Vaccine  07/09/2019  . Flu Shot  Completed  . Pneumonia vaccines  Completed  *Topic was postponed. The date shown is not the original due date.   Preventive Care for Adults  A healthy lifestyle and preventive care can promote health and wellness. Preventive health guidelines for adults include the following key practices.  . A routine yearly physical is a good way to check with your health care provider about your health and preventive screening. It is a chance to share any concerns and updates on your health and to receive a thorough exam.  . Visit your dentist for a routine exam and preventive care every 6 months. Brush your teeth twice a day and floss once a day. Good oral hygiene prevents tooth decay and gum disease.  . The frequency of eye exams is based on your age, health, family medical history, use  of contact lenses, and other factors. Follow your health care provider's recommendations for frequency of eye exams.  . Eat a healthy diet. Foods like vegetables, fruits, whole grains, low-fat dairy products, and lean protein foods contain the nutrients you need without too many calories. Decrease your intake of foods high in solid fats, added sugars, and salt. Eat the right amount of calories for you. Get information about a proper diet from your health care provider, if  necessary.  . Regular physical exercise is one of the most important things you can do for your health. Most adults should get at least 150 minutes of moderate-intensity exercise (any activity that increases your heart rate and causes you to sweat) each week. In addition, most adults need muscle-strengthening exercises on 2 or more days a week.  Silver Sneakers may be a benefit available to you. To determine eligibility, you may visit the website: www.silversneakers.com or contact program at 434-698-4988 Mon-Fri between 8AM-8PM.   . Maintain a healthy weight. The body mass index (BMI) is a screening tool to identify possible weight problems. It provides an estimate of body fat based on height and weight. Your health care provider can find your BMI and can help you achieve or maintain a healthy weight.   For adults 20 years and older: ? A BMI below 18.5 is considered underweight. ? A BMI of 18.5 to 24.9 is normal. ? A BMI of 25 to 29.9 is considered overweight. ? A BMI of 30 and above is considered obese.   . Maintain normal blood lipids and cholesterol levels by exercising and minimizing your intake of saturated fat. Eat a balanced diet with plenty of fruit and vegetables. Blood tests for lipids and cholesterol should begin at age 42 and be repeated every 5 years. If your lipid or cholesterol levels are high, you are over 50, or you are at high risk for heart  disease, you may need your cholesterol levels checked more frequently. Ongoing high lipid and cholesterol levels should be treated with medicines if diet and exercise are not working.  . If you smoke, find out from your health care provider how to quit. If you do not use tobacco, please do not start.  . If you choose to drink alcohol, please do not consume more than 2 drinks per day. One drink is considered to be 12 ounces (355 mL) of beer, 5 ounces (148 mL) of wine, or 1.5 ounces (44 mL) of liquor.  . If you are 75-52 years old, ask your  health care provider if you should take aspirin to prevent strokes.  . Use sunscreen. Apply sunscreen liberally and repeatedly throughout the day. You should seek shade when your shadow is shorter than you. Protect yourself by wearing long sleeves, pants, a wide-brimmed hat, and sunglasses year round, whenever you are outdoors.  . Once a month, do a whole body skin exam, using a mirror to look at the skin on your back. Tell your health care provider of new moles, moles that have irregular borders, moles that are larger than a pencil eraser, or moles that have changed in shape or color.

## 2017-09-20 LAB — T4, FREE: Free T4: 1.22 ng/dL (ref 0.60–1.60)

## 2017-09-20 LAB — T3, FREE: T3 FREE: 2.1 pg/mL — AB (ref 2.3–4.2)

## 2017-09-20 LAB — TSH: TSH: 0.28 u[IU]/mL — AB (ref 0.35–4.50)

## 2017-09-20 LAB — PREALBUMIN: Prealbumin: 21 mg/dL (ref 17–34)

## 2017-09-20 NOTE — Progress Notes (Signed)
Subjective:   [name] is a 84 y.o. female who presents for Medicare Annual (Subsequent) preventive examination.  Review of Systems:  N/A Cardiac Risk Factors include: advanced age (>81mn, >>62women);obesity (BMI >30kg/m2);dyslipidemia;hypertension     Objective:     Vitals: BP 122/84 (BP Location: Right Arm, Patient Position: Sitting, Cuff Size: Normal)   Pulse (!) 59   Temp 97.8 F (36.6 C) (Oral)   Ht 5' 5.5" (1.664 m) Comment: no shoes  Wt 183 lb 8 oz (83.2 kg)   SpO2 91%   BMI 30.07 kg/m   Body mass index is 30.07 kg/m.  Advanced Directives 09/19/2017 08/12/2017 08/04/2017 07/21/2017 07/01/2017 06/28/2017 06/24/2016  Does Patient Have a Medical Advance Directive? Yes _0  Yes  Type of AParamedicof ACross KeysLiving will - - - - - HPress photographerLiving will  Does patient want to make changes to medical advance directive? - - - - - - No - Patient declined  Copy of [place] Chart? No - copy requested - - - - - No - copy requested  Would patient like information on creating a medical advance directive? - No - Patient declined No - Patient declined No - Patient declined No - Patient declined No - Patient declined -    Tobacco Social History   Tobacco Use  Smoking Status Former Smoker  . Last attempt to quit: 07/12/2005  . Years since quitting: 12.2  Smokeless Tobacco Never Used  Tobacco Comment   quit in 102-19-97    Counseling given: No Comment: quit in 11997/02/19  Clinical Intake:  Pre-visit preparation completed: Yes  Pain : No/denies pain Pain Score: 0-No pain     Nutritional Status: BMI > 30  Obese Nutritional Risks: None Diabetes: No  How often do you need to have someone help you when you read instructions, pamphlets, or other written materials from your doctor or pharmacy?: 1 - Never What is the last grade level you completed in school?: 12th grade + business college  Interpreter  Needed?: No  Comments: pt is a widow and lives alone Information entered by :: LPinson, LPN  Past Medical History:  Diagnosis Date  . AAA (abdominal aortic aneurysm) (HCC)    Recurrent; s/p repair, now 3.8 cam; h/o ischemic bowel 2Feb 19, 2006 . Aortic stenosis    Moderate; Echo 12/09. moderate AS mean 19. AVA 0.88  . Basal cell carcinoma of nose 1980s  . Bruises easily   . CAD (coronary artery disease)    s/p NSTEMI 1/07 with BMS to OM; cath 12/09 due to positive Myoview, LM ok. LAD 40%. LCX 80-90% mid. RCA chronically occluded with R --> L collats  . Carotid artery stenosis    R 60-79% L 0-39% (6/10) - stable  . Cataract   . CRI (chronic renal insufficiency)    Mild, Cr 1.3  . Depression 2Feb 19, 2006  With death of spouse  . Full dentures   . GERD (gastroesophageal reflux disease)   . HTN (hypertension)   . Hyperlipidemia    Increased LFTs on Vytorin. Changed to Lipitor.  . Hyperparathyroidism   . Hypothyroidism   . Incontinence   . Myocardial infarction ([place] 2[place] 19, 2007 . PAD (peripheral artery disease) (HCC)    s/p iliac stents  . Renal artery stenosis (HCC)    Atrophic L kidney. Moderate blockage on R  . Ventral hernia    Moderate sized  . Vitamin  D deficiency    Past Surgical History:  Procedure Laterality Date  . ABDOMINAL AORTIC ANEURYSM REPAIR  1995, 07/01/2005  . ANGIOPLASTY  2005 - 07/01/2005  . APPENDECTOMY  1954  . breast biospy  1996  . BREAST SURGERY    . CATARACT EXTRACTION, BILATERAL    . CHOLECYSTECTOMY  07/01/2005  . CHOLECYSTECTOMY  1987  . COLONOSCOPY  07/07/2005   Colonic erosions, ulcers proctitis  . COLONOSCOPY W/ ENDOSCOPIC [place]  07/01/2005   [place] kidney diffuse atrophy  . CORONARY STENT PLACEMENT    . CORONARY STENT PLACEMENT  07/2005   MI   . HERNIA REPAIR  1997  . HOSP  05/08/2005   ? abd. prob. mesenteric ischemia  . HOSP  01/2006   Hosp for reflux (chest pain)  . ILIAC ARTERY STENT  2005 - 07/01/2005   Stent placement, both legs - PVD in arteries  .  NSTEMI  08/13/2005   1/07 s/p stent in circumflex  . TONSILLECTOMY  1943  . UPPER GASTROINTESTINAL ENDOSCOPY  07/07/2005   Nml   Family History  Problem Relation Age of Onset  . Heart attack Mother        MI  . Cancer Mother   . Heart disease Mother        heart attack  . Stroke Mother   . Cancer Father        throat  . Heart attack Father        MI  . Throat cancer Father   . Heart disease Father        heart attack   . Stroke Father   . Cancer Sister        breast  . Breast cancer Sister   . Hyperlipidemia Sister   . Hyperlipidemia Sister   . Hypertension Sister   . Hyperlipidemia Son   . Colon cancer Neg Hx    Social History   Socioeconomic History  . Marital status: Widowed    Spouse name: None  . Number of children: None  . Years of education: None  . Highest education level: None  Social Needs  . Financial resource strain: None  . Food insecurity - worry: None  . Food insecurity - inability: None  . Transportation needs - medical: None  . Transportation needs - non-medical: None  Occupational History  . None  Tobacco Use  . Smoking status: Former Smoker    Last attempt to quit: 07/12/2005    Years since quitting: 12.2  . Smokeless tobacco: Never Used  . Tobacco comment: quit in 1997  Substance and Sexual Activity  . Alcohol use: No  . Drug use: No  . Sexual activity: None  Other Topics Concern  . None  Social History Narrative   Husband deceased 2005-05-08 from multiple myeloma, lives alone near sister..    Has living will, HCPOA: daughter: [name]   Full code. Has lifealert. (Reviewed 2014)          Outpatient Encounter Medications as of 09/19/2017  Medication Sig  . acetaminophen (TYLENOL) 500 MG tablet Take 1,000 mg by mouth 2 (two) times daily as needed for pain.  . Amino Acids-Protein Hydrolys (FEEDING SUPPLEMENT, PRO-STAT SUGAR FREE 64,) LIQD Take 30 mLs by mouth 2 (two) times daily between meals.  Marland Kitchen aspirin 325 MG tablet Take 325 mg by  mouth 2 (two) times daily.  Marland Kitchen atorvastatin (LIPITOR) 80 MG tablet TAKE 1 TABLET DAILY  . Cholecalciferol 1000 UNITS capsule Take 3,000  Units by mouth daily.   . clopidogrel (PLAVIX) 75 MG tablet TAKE 1 TABLET DAILY  . estradiol (ESTRACE VAGINAL) 0.1 MG/GM vaginal cream Place 2 g vaginally 2 (two) times a week. Thursday and Sunday  . Febuxostat (ULORIC) 80 MG TABS Take 80 mg by mouth daily.   . isosorbide mononitrate (IMDUR) 30 MG 24 hr tablet TAKE 1 TABLET TWICE A DAY  . levothyroxine (SYNTHROID, LEVOTHROID) 112 MCG tablet TAKE 1 TABLET DAILY  . metoprolol tartrate (LOPRESSOR) 25 MG tablet TAKE ONE-HALF (1/2) TABLET TWICE A DAY  . sertraline (ZOLOFT) 50 MG tablet TAKE 1 TABLET (50 MG TOTAL) BY MOUTH DAILY.  . traMADol (ULTRAM) 50 MG tablet Take 1 tablet (50 mg total) by mouth every 6 (six) hours as needed for moderate pain.   No facility-administered encounter medications on file as of 09/19/2017.     Activities of Daily Living In your present state of health, do you have any difficulty performing the following activities: 09/19/2017 06/29/2017  Hearing? Tempie Donning  Vision? N N  Difficulty concentrating or making decisions? N N  Walking or climbing stairs? N N  Dressing or bathing? N Y  Doing errands, shopping? N N  Preparing Food and eating ? N -  Using the Toilet? N -  In the past six months, have you accidently leaked urine? N -  Do you have problems with loss of bowel control? N -  Managing your Medications? N -  Managing your Finances? N -  Housekeeping or managing your Housekeeping? N -  Some recent data might be hidden    Patient Care Team: Jinny Sanders, MD as PCP - General (Family Medicine) Minna Merritts, MD (Cardiology) [name], Shaune Pascal, MD (Cardiology)    Assessment:   This is a routine wellness examination for Bretta.   Hearing Screening   _0  _1  _2  _3  _4  _5  _6  _7  _8   Right ear:   0 0 0  0    Left ear:   0 0 0  0    Vision  Screening Comments: Last vision exam in June 2018 with Dr. Gloriann Loan    Exercise Activities and Dietary recommendations Current Exercise Habits: The patient does not participate in regular exercise at present, Exercise limited by: None identified  Goals    . Follow up with Primary Care Provider     Starting 09/19/2017, I will continue to take medications as prescribed and to keep appointments with PCP as scheduled.        Fall Risk Fall Risk  09/19/2017 06/24/2016 12/13/2014 06/26/2013  Falls in the past year? Yes No No No  Number falls in past yr: 1 - - -  Injury with Fall? Yes - - -  Risk for fall due to : Impaired balance/gait - - -   Depression Screen PHQ 2/9 Scores 09/19/2017 05/24/2017 08/17/2016 06/24/2016  PHQ - 2 Score 0 5 0 2  PHQ- 9 Score 0 _9 Cognitive Function MMSE - Mini Mental State Exam 09/19/2017  Orientation to time 5  Orientation to Place 5  Registration 3  Attention/ Calculation 0  Recall 3  Language- name 2 objects 0  Language- repeat 1  Language- follow 3 step command 2  Language- follow 3 step command-comments unable to follow 1 step of 3 step command  Language- read & follow direction 0  Write a sentence 0  Copy design 0  Total score 19     PLEASE NOTE: A  Mini-Cog screen was completed. Maximum score is 20. A value of 0 denotes this part of Folstein MMSE was not completed or the patient failed this part of the Mini-Cog screening.   Mini-Cog Screening Orientation to Time - Max 5 pts Orientation to Place - Max 5 pts Registration - Max 3 pts Recall - Max 3 pts Language Repeat - Max 1 pts Language Follow 3 Step Command - Max 3 pts     Immunization History  Administered Date(s) Administered  . Influenza Split 06/18/2011, 06/14/2012, 04/06/2013  . Influenza Whole 05/12/2006, 04/08/2009, 07/03/2010  . Influenza,inj,Quad PF,6+ Mos 04/02/2014, 04/24/2015, 04/12/2016, 04/18/2017  . Pneumococcal Conjugate-13 12/13/2014  . Pneumococcal  Polysaccharide-23 04/11/2005, 05/12/2006  . Td 07/08/2009  . Zoster 07/08/2009   Screening Tests Health Maintenance  Topic Date Due  . DEXA SCAN  10/10/2018 (Originally 10/27/1994)  . TETANUS/TDAP  07/09/2019  . INFLUENZA VACCINE  Completed  . PNA vac Low Risk Adult  Completed       Plan:     I have personally reviewed, addressed, and noted the following in the patient's chart:  A. Medical and social history B. Use of alcohol, tobacco or illicit drugs  C. Current medications and supplements D. Functional ability and status E.  Nutritional status F.  Physical activity G. Advance directives H. List of other physicians I.  Hospitalizations, surgeries, and ER visits in previous 12 months J.  [place] to include hearing, vision, cognitive, depression L. Referrals and appointments - none  In addition, I have reviewed and discussed with patient certain preventive protocols, quality metrics, and best practice recommendations. A written personalized care plan for preventive services as well as general preventive health recommendations were provided to patient.  See attached scanned questionnaire for additional information.   Signed,   Lindell Noe, MHA, BS, LPN Health Coach

## 2017-09-20 NOTE — Progress Notes (Signed)
PCP notes:   Health maintenance:  Bone density - addressed  Abnormal screenings:   Hearing - failed  Hearing Screening   125Hz  250Hz  500Hz  1000Hz  2000Hz  3000Hz  4000Hz  6000Hz  8000Hz   Right ear:   0 0 0  0    Left ear:   0 0 0  0     Fall risk - hx of single fall Fall Risk  09/19/2017 06/24/2016 12/13/2014 06/26/2013  Falls in the past year? Yes No No No  Number falls in past yr: 1 - - -  Injury with Fall? Yes - - -  Risk for fall due to : Impaired balance/gait - - -   Mini-Cog score: 19/20 MMSE - Mini Mental State Exam 09/19/2017  Orientation to time 5  Orientation to Place 5  Registration 3  Attention/ Calculation 0  Recall 3  Language- name 2 objects 0  Language- repeat 1  Language- follow 3 step command 2  Language- follow 3 step command-comments unable to follow 1 step of 3 step command  Language- read & follow direction 0  Write a sentence 0  Copy design 0  Total score 19      Patient concerns:   None  Nurse concerns:  None  Next PCP appt:   09/29/17 @ 1030

## 2017-09-20 NOTE — Progress Notes (Signed)
I reviewed health advisor's note, was available for consultation, and agree with documentation and plan.  

## 2017-09-21 NOTE — Progress Notes (Signed)
I reviewed health advisor's note, was available for consultation, and agree with documentation and plan.   Signed,  Cashton Hosley T. Kobie Matkins, MD  

## 2017-09-24 NOTE — Progress Notes (Signed)
Cardiology Office Note  Date:  09/27/2017   ID:  [name], DOB 11-27-1927, MRN [id number]  PCP:  Jinny Sanders, MD   Chief Complaint  Patient presents with  . Other    6 month follow up. Patient fell in December and broke both ankles. Patient has been at [facility] for rehab. Patient denies chest pain and SOB. Meds reviewed verbally with patient.     HPI:  [name] is an 84 year old woman with history of  Moderate  aortic valve stenosis  coronary artery disease,  status post non-ST-elevation myocardial infarction in January 2007, bare-metal stenting to the obtuse marginal,  totally occluded right coronary with Collaterals residual signifcant LCX disease not easily amenable to intervention, abdominal aortic aneurysm repair and bilateral iliac stenting,  descending aorta aneurysm 4.8 cm with mural thrombus carotid artery stenosis (critical on the right) followed by Dr. Oneida Alar, 80-99%  renal artery stenosis with an atrophic left kidney and a moderate blockage on the right, hypertension  hyperlipidemia,   Anxiety more forgetful/memory issues She presents for follow-up of her coronary artery disease, aortic valve stenosis and PAD  In follow-up today she reports that she got up out of a chair in December 2018 fell over her shoes broke both her ankles No surgery was required Went to rehab for prolonged period of time, had good recovery  Had casts on , developed wounds from sweating Now better 3 months later Ambulating without assistance, no cane or walker  Denies any significant shortness of breath or chest pain No TIA or stroke symptoms Overall feels well without complaints Denies any claudication type symptoms  Carotid ultrasound results reviewed with her showing still critical disease on the right She has follow-up with Dr. Oneida Alar  She has been taking aspirin 325 mg twice a day with Plavix  She is tolerating Lipitor 80 mg daily  EKG on today's visit showing normal  sinus rhythm with rate 59 bpm, no significant ST or T-wave changes  Other past medical history She stopped smoking approximately 10 years ago, smoked for 20-30 years but the exact number of years is vague   carotid ultrasound showed progression of the stenosis on the right. Estimate is 80-99%.  She has seen Dr. Oneida Alar with repeat followup and ultrasound in 2014. Plan was made to watch her closely with possible stenting with any symptoms of TIA or stroke   Aortic ultrasound showed tortuous ectatic aorta biiliac bypass graft. Maximum dimensions 3.5 x 3.3 cm, dilated left common iliac artery 2 cm Previous echocardiogram EF 55-65% Mild to moderate AS     PMH:   has a past medical history of AAA (abdominal aortic aneurysm) ([place]), Aortic stenosis, Basal cell carcinoma of nose (1980s), Bruises easily, CAD (coronary artery disease), Carotid artery stenosis, Cataract, CRI (chronic renal insufficiency), Depression (2006), Full dentures, GERD (gastroesophageal reflux disease), HTN (hypertension), Hyperlipidemia, Hyperparathyroidism, Hypothyroidism, Incontinence, Myocardial infarction ([place]) (2007), PAD (peripheral artery disease) ([place]), Renal artery stenosis ([place]), Ventral hernia, and Vitamin D deficiency.  PSH:    Past Surgical History:  Procedure Laterality Date  . ABDOMINAL AORTIC ANEURYSM REPAIR  1995, 07/01/2005  . ANGIOPLASTY  2005 - 07/01/2005  . APPENDECTOMY  1954  . breast biospy  1996  . BREAST SURGERY    . CATARACT EXTRACTION, BILATERAL    . CHOLECYSTECTOMY  07/01/2005  . CHOLECYSTECTOMY  1987  . COLONOSCOPY  07/07/2005   Colonic erosions, ulcers proctitis  . COLONOSCOPY W/ ENDOSCOPIC [place]  07/01/2005   [place] kidney  diffuse atrophy  . CORONARY STENT PLACEMENT    . CORONARY STENT PLACEMENT  07/2005   MI   . HERNIA REPAIR  1997  . HOSP  04/2005   ? abd. prob. mesenteric ischemia  . HOSP  01/2006   Hosp for reflux (chest pain)  . ILIAC ARTERY STENT  2005 - 07/01/2005   Stent  placement, both legs - PVD in arteries  . NSTEMI  08/13/2005   1/07 s/p stent in circumflex  . TONSILLECTOMY  1943  . UPPER GASTROINTESTINAL ENDOSCOPY  07/07/2005   Nml    Current Outpatient Medications  Medication Sig Dispense Refill  . acetaminophen (TYLENOL) 500 MG tablet Take 1,000 mg by mouth 2 (two) times daily as needed for pain.    Marland Kitchen aspirin 325 MG tablet Take 325 mg by mouth 2 (two) times daily.    Marland Kitchen atorvastatin (LIPITOR) 80 MG tablet TAKE 1 TABLET DAILY 90 tablet 0  . Cholecalciferol 1000 UNITS capsule Take 3,000 Units by mouth daily.     . clopidogrel (PLAVIX) 75 MG tablet TAKE 1 TABLET DAILY 90 tablet 0  . estradiol (ESTRACE VAGINAL) 0.1 MG/GM vaginal cream Place 2 g vaginally 2 (two) times a week. Thursday and Sunday    . Febuxostat (ULORIC) 80 MG TABS Take 80 mg by mouth daily.     . isosorbide mononitrate (IMDUR) 30 MG 24 hr tablet TAKE 1 TABLET TWICE A DAY 180 tablet 1  . levothyroxine (SYNTHROID, LEVOTHROID) 112 MCG tablet TAKE 1 TABLET DAILY 90 tablet 0  . metoprolol tartrate (LOPRESSOR) 25 MG tablet TAKE ONE-HALF (1/2) TABLET TWICE A DAY 90 tablet 3  . sertraline (ZOLOFT) 50 MG tablet TAKE 1 TABLET (50 MG TOTAL) BY MOUTH DAILY. 30 tablet 3  . traMADol (ULTRAM) 50 MG tablet Take 1 tablet (50 mg total) by mouth every 6 (six) hours as needed for moderate pain. 30 tablet 1   No current facility-administered medications for this visit.      Allergies:   Fish oil; Naproxen; Tramadol; Codeine; Morphine sulfate; and Sulfonamide derivatives   Social History:  The patient  reports that she quit smoking about 12 years ago. she has never used smokeless tobacco. She reports that she does not drink alcohol or use drugs.   Family History:   family history includes Breast cancer in her sister; Cancer in her father, mother, and sister; Heart attack in her father and mother; Heart disease in her father and mother; Hyperlipidemia in her sister, sister, and son; Hypertension in her  sister; Stroke in her father and mother; Throat cancer in her father.    Review of Systems: Review of Systems  Constitutional: Negative.   Respiratory: Negative.   Cardiovascular: Negative.   Gastrointestinal: Negative.   Musculoskeletal: Negative.   Neurological: Negative.   Psychiatric/Behavioral: Negative.        Mild confusion  All other systems reviewed and are negative.    PHYSICAL EXAM: VS:  BP 130/72 (BP Location: Left Arm, Patient Position: Sitting, Cuff Size: Normal)   Pulse (!) 59   Ht 5' 5.5" (1.664 m)   Wt 184 lb (83.5 kg)   BMI 30.15 kg/m  , BMI Body mass index is 30.15 kg/m. Constitutional:  oriented to person, place, and time. No distress.  HENT:  Head: Normocephalic and atraumatic.  Eyes:  no discharge. No scleral icterus.  Neck: Normal range of motion. Neck supple. No JVD present.  Cardiovascular: Normal rate, regular rhythm, normal heart sounds and intact distal pulses.  Exam reveals no gallop and no friction rub. No edema 2/6 SEM RSB Pulmonary/Chest: Effort normal and breath sounds normal. No stridor. No respiratory distress.  no wheezes.  no rales.  no tenderness.  Abdominal: Soft.  no distension.  no tenderness.  Musculoskeletal: Normal range of motion.  no  tenderness or deformity.  Neurological:  normal muscle tone. Coordination normal. No atrophy Skin: Skin is warm and dry. No rash noted. not diaphoretic.  Psychiatric:  normal mood and affect. behavior is normal. Thought content normal.    Recent Labs: 08/02/2017: Hemoglobin 12.2; Platelets 152 09/19/2017: ALT 10; BUN 28; Creatinine, Ser 1.32; Potassium 4.7; Sodium 142; TSH 0.28    Lipid Panel Lab Results  Component Value Date   CHOL 158 09/19/2017   HDL 41.20 09/19/2017   LDLCALC 77 09/19/2017   TRIG 198.0 (H) 09/19/2017      Wt Readings from Last 3 Encounters:  09/27/17 184 lb (83.5 kg)  09/19/17 183 lb 8 oz (83.2 kg)  08/11/17 181 lb 4.8 oz (82.2 kg)       ASSESSMENT AND  PLAN:  Essential hypertension - Plan: EKG 12-Lead Blood pressure is well controlled on today's visit. No changes made to the medications. Stable  Atherosclerosis of native coronary artery without angina pectoris, unspecified whether native or transplanted heart - Currently with no symptoms of angina. No further workup at this time. Continue current medication regimen. We will decrease aspirin down to 81 mg daily with her Plavix  Stenosis of right carotid artery She has follow-up with Dr. Oneida Alar  severe stenosis on the right with no recent TIA or stroke symptoms  Aortic valve stenosis, etiology of cardiac valve disease unspecified Moderate  aortic valve stenosis Repeat echocardiogram has been ordered October 2019 Murmurs not particularly impressive on today's visit  mild discrepancy between 2017 and 2018, results discussed with her in detail  Hyperlipidemia Cholesterol is at goal on the current lipid regimen. No changes to the medications were made.  Goal LDL less than 70  Abdominal aortic aneurysm (AAA) without rupture (HCC)  followed by Dr. Oneida Alar in [place]   PAD (peripheral artery disease) ([place])  significant lower extremity PAD, previous stenting , carotid disease  Stable   Chronic kidney disease (CKD), stage II (mild) Stable   anxiety  Continued mild issues with memory, anxiety , confusion    Total encounter time more than 25 minutes  Greater than 50% was spent in counseling and coordination of care with the patient   Disposition:   F/U  12 months   Orders Placed This Encounter  Procedures  . EKG 12-Lead     Signed, Esmond Plants, M.D., Ph.D. 09/27/2017  [place], [place]

## 2017-09-26 ENCOUNTER — Other Ambulatory Visit: Payer: Medicare Other

## 2017-09-26 ENCOUNTER — Ambulatory Visit: Payer: Medicare Other

## 2017-09-27 ENCOUNTER — Encounter: Payer: Self-pay | Admitting: Cardiovascular Disease

## 2017-09-27 ENCOUNTER — Ambulatory Visit (INDEPENDENT_AMBULATORY_CARE_PROVIDER_SITE_OTHER): Payer: Medicare Other | Admitting: Cardiovascular Disease

## 2017-09-27 VITALS — BP 130/72 | HR 59 | Ht 65.5 in | Wt 184.0 lb

## 2017-09-27 DIAGNOSIS — E7800 Pure hypercholesterolemia, unspecified: Secondary | ICD-10-CM

## 2017-09-27 DIAGNOSIS — I25118 Atherosclerotic heart disease of native coronary artery with other forms of angina pectoris: Secondary | ICD-10-CM

## 2017-09-27 DIAGNOSIS — I350 Nonrheumatic aortic (valve) stenosis: Secondary | ICD-10-CM

## 2017-09-27 DIAGNOSIS — I712 Thoracic aortic aneurysm, without rupture: Secondary | ICD-10-CM

## 2017-09-27 DIAGNOSIS — N183 Chronic kidney disease, stage 3 unspecified: Secondary | ICD-10-CM

## 2017-09-27 DIAGNOSIS — I6521 Occlusion and stenosis of right carotid artery: Secondary | ICD-10-CM

## 2017-09-27 DIAGNOSIS — I7123 Aneurysm of the descending thoracic aorta, without rupture: Secondary | ICD-10-CM

## 2017-09-27 DIAGNOSIS — E782 Mixed hyperlipidemia: Secondary | ICD-10-CM

## 2017-09-27 NOTE — Patient Instructions (Addendum)
Medication Instructions:   Stop the big dose aspirin Restart aspirin 81 mg daily Stay on the plavix  Labwork:  No new labs needed  Testing/Procedures:  Echo in 04/2018 for aortic valve stenosis   Follow-Up: It was a pleasure seeing you in the office today. Please call us if you have new issues that need to be addressed before your next appt.  [phone]  Your physician wants you to follow-up in: 12 months.  You will receive a reminder letter in the mail two months in advance. If you don't receive a letter, please call our office to schedule the follow-up appointment.  If you need a refill on your cardiac medications before your next appointment, please call your pharmacy.  For educational health videos Log in to : [email or web address] Or : [email or web address], password : [email or web address]

## 2017-09-29 ENCOUNTER — Ambulatory Visit (INDEPENDENT_AMBULATORY_CARE_PROVIDER_SITE_OTHER): Payer: Medicare Other | Admitting: Family Medicine

## 2017-09-29 ENCOUNTER — Encounter: Payer: Self-pay | Admitting: Family Medicine

## 2017-09-29 ENCOUNTER — Other Ambulatory Visit: Payer: Self-pay

## 2017-09-29 ENCOUNTER — Encounter: Payer: Self-pay | Admitting: *Deleted

## 2017-09-29 VITALS — BP 130/76 | HR 59 | Temp 97.5°F | Ht 65.5 in | Wt 185.0 lb

## 2017-09-29 DIAGNOSIS — N183 Chronic kidney disease, stage 3 unspecified: Secondary | ICD-10-CM

## 2017-09-29 DIAGNOSIS — Z0000 Encounter for general adult medical examination without abnormal findings: Secondary | ICD-10-CM

## 2017-09-29 DIAGNOSIS — E782 Mixed hyperlipidemia: Secondary | ICD-10-CM | POA: Diagnosis not present

## 2017-09-29 DIAGNOSIS — F331 Major depressive disorder, recurrent, moderate: Secondary | ICD-10-CM | POA: Diagnosis not present

## 2017-09-29 DIAGNOSIS — M8580 Other specified disorders of bone density and structure, unspecified site: Secondary | ICD-10-CM | POA: Diagnosis not present

## 2017-09-29 DIAGNOSIS — E039 Hypothyroidism, unspecified: Secondary | ICD-10-CM

## 2017-09-29 DIAGNOSIS — I6521 Occlusion and stenosis of right carotid artery: Secondary | ICD-10-CM | POA: Diagnosis not present

## 2017-09-29 DIAGNOSIS — I739 Peripheral vascular disease, unspecified: Secondary | ICD-10-CM | POA: Diagnosis not present

## 2017-09-29 DIAGNOSIS — I714 Abdominal aortic aneurysm, without rupture, unspecified: Secondary | ICD-10-CM

## 2017-09-29 DIAGNOSIS — E46 Unspecified protein-calorie malnutrition: Secondary | ICD-10-CM

## 2017-09-29 NOTE — Assessment & Plan Note (Signed)
Good control of depression and anxiety on sertraline.

## 2017-09-29 NOTE — Assessment & Plan Note (Signed)
TSH low but free 4 low as well... nml t3.  Conitnue to follow. Pt feeling well on current dose of medication.

## 2017-09-29 NOTE — Assessment & Plan Note (Signed)
Treating medically. Followed with dopplers per cardiology.

## 2017-09-29 NOTE — Assessment & Plan Note (Signed)
Followed by Dr. Fields. 

## 2017-09-29 NOTE — Assessment & Plan Note (Signed)
Increased LDL but almost at goal.. Likely wrose from alst year given very sedentary with fractures in last 3 months. Encouraged exercise, weight loss, healthy eating habits.

## 2017-09-29 NOTE — Assessment & Plan Note (Signed)
Stable control. 

## 2017-09-29 NOTE — Progress Notes (Signed)
Subjective:    Patient ID: [name], female    DOB: 1927-11-22, 83 y.o.   MRN: [id number]  HPI   The patient presents for  complete physical and review of chronic health problems. He/She also has the following acute concerns today:  The patient saw Candis Musa, LPN for medicare wellness. Note reviewed in detail and important notes copied below. Health maintenance:  Bone density - addressed  Abnormal screenings:   Hearing - failed             Hearing Screening   125Hz  250Hz  500Hz  1000Hz  2000Hz  3000Hz  4000Hz  6000Hz  8000Hz   Right ear:   0 0 0  0    Left ear:   0 0 0  0     Fall risk - hx of single fall Fall Risk  09/19/2017 06/24/2016 12/13/2014 06/26/2013  Falls in the past year? Yes No No No  Number falls in past yr: 1 - - -  Injury with Fall? Yes - - -  Risk for fall due to : Impaired balance/gait - - -   Mini-Cog score: 19/20 MMSE - Mini Mental State Exam 09/19/2017  Orientation to time 5  Orientation to Place 5  Registration 3  Attention/ Calculation 0  Recall 3  Language- name 2 objects 0  Language- repeat 1  Language- follow 3 step command 2  Language- follow 3 step command-comments unable to follow 1 step of 3 step command  Language- read & follow direction 0  Write a sentence 0  Copy design 0  Total score 19      Today. 09/29/17  She is fully recovered from bilateral lower leg fracture in 06/2017 after a fall.  She has been released from rehab 08/12/2017. She was living at daughters house.. Now back to  Her own home in last 2 weks.  no recent falls.  Walking better, stronger.   Malnutrition: prealbumin nml... Improved since in rehab.. Will start protein drink.   MDD, stable control on sertraline 50 mg daily.  Elevated Cholesterol:  LDl at goal on atorvastatin Lab Results  Component Value Date   CHOL 158 09/19/2017   HDL 41.20 09/19/2017   LDLCALC 77 09/19/2017   LDLDIRECT 29.0 06/21/2016   TRIG 198.0 (H) 09/19/2017   CHOLHDL 4 09/19/2017  Using medications without problems: none Muscle aches:  none Diet compliance: moderate Exercise: none Other complaints:  Wt Readings from Last 3 Encounters:  09/29/17 185 lb (83.9 kg)  09/27/17 184 lb (83.5 kg)  09/19/17 183 lb 8 oz (83.2 kg)    Hypothyroidism:   T3 slightly decreased and free t4 normal.  On levothyroxine 112 Lab Results  Component Value Date   TSH 0.28 (L) 09/19/2017   Hypertension:  metoprolol Good control on   BP Readings from Last 3 Encounters:  09/29/17 130/76  09/27/17 130/72  09/19/17 122/84  Using medication without problems or lightheadedness:  none Chest pain with exertion: none Edema: minimal swelling Short of breath: none Average home BPs: Other issues:  CKD stable GFR.   PAD, AAA: followed by Dr. Oneida Alar  Gout  On uloric.Marland Kitchen  No gout flares in last year.  Lab Results  Component Value Date   LABURIC 3.5 09/19/2017     Per Dr. Rockey Situ: Atherosclerosis of native coronary artery without angina pectoris, unspecified whether native or transplanted heart - Currently with no symptoms of angina. No further workup at this time. Continue current medication regimen. We will decrease aspirin down to 81 mg  daily with her Plavix  Aortic valve stenosis, etiology of cardiac valve disease unspecified Moderate  aortic valve stenosis Repeat echocardiogram has been ordered October 2019 Murmurs not particularly impressive on today's visit  mild discrepancy between 2017 and 2018, results discussed with her in detail     Social History /Family History/Past Medical History reviewed in detail and updated in EMR if needed. Blood pressure 130/76, pulse (!) 59, temperature (!) 97.5 F (36.4 C), temperature source Oral, height 5' 5.5" (1.664 m), weight 185 lb (83.9 kg).   Review of Systems  Constitutional: Negative for fatigue and fever.  HENT: Negative for congestion.   Eyes: Negative for pain.  Respiratory: Negative for cough and  shortness of breath.   Cardiovascular: Negative for chest pain, palpitations and leg swelling.  Gastrointestinal: Negative for abdominal pain.  Genitourinary: Negative for dysuria and vaginal bleeding.  Musculoskeletal: Negative for back pain.  Neurological: Negative for syncope, light-headedness and headaches.  Psychiatric/Behavioral: Negative for dysphoric mood.       Objective:   Physical Exam  Constitutional: Vital signs are normal. She appears well-developed and well-nourished. She is cooperative.  Non-toxic appearance. She does not appear ill. No distress.  HENT:  Head: Normocephalic.  Right Ear: Hearing, tympanic membrane, external ear and ear canal normal. Tympanic membrane is not erythematous, not retracted and not bulging.  Left Ear: Hearing, tympanic membrane, external ear and ear canal normal. Tympanic membrane is not erythematous, not retracted and not bulging.  Nose: No mucosal edema or rhinorrhea. Right sinus exhibits no maxillary sinus tenderness and no frontal sinus tenderness. Left sinus exhibits no maxillary sinus tenderness and no frontal sinus tenderness.  Mouth/Throat: Uvula is midline, oropharynx is clear and moist and mucous membranes are normal.  Eyes: Pupils are equal, round, and reactive to light. Conjunctivae, EOM and lids are normal. Lids are everted and swept, no foreign bodies found.  Neck: Trachea normal and normal range of motion. Neck supple. Carotid bruit is not present. No thyroid mass and no thyromegaly present.  Cardiovascular: Normal rate, regular rhythm, S1 normal, S2 normal, intact distal pulses and normal pulses. Exam reveals no gallop and no friction rub.  Murmur heard.  Systolic murmur is present with a grade of 3/6. Pulmonary/Chest: Effort normal and breath sounds normal. No tachypnea. No respiratory distress. She has no decreased breath sounds. She has no wheezes. She has no rhonchi. She has no rales.  Abdominal: Soft. Normal appearance and bowel  sounds are normal. There is no tenderness.  Neurological: She is alert.  Skin: Skin is warm, dry and intact. No rash noted.  Psychiatric: Judgment and thought content normal. Her mood appears not anxious. Her speech is not rapid and/or pressured. She is not agitated and not withdrawn. Cognition and memory are normal. She does not exhibit a depressed mood. She expresses no homicidal and no suicidal ideation. She expresses no suicidal plans and no homicidal plans.   Smiling and happy at appt.           Assessment & Plan:  The patient's preventative maintenance and recommended screening tests for an annual wellness exam were reviewed in full today. Brought up to date unless services declined.  Counselled on the importance of diet, exercise, and its role in overall health and mortality. The patient's FH and SH was reviewed, including their home life, tobacco status, and drug and alcohol status.   Vaccines: uptodate No indication for mammogram.  No indication for DVE/pap smear.  Colonoscopy: last in 2009.. No bowel  issues.. Does not wish to repeat.  DXA: last 2010.Marland Kitchen Showed osteopenia in hip and spine...  Willing to do given fractures.  Due!  Taking CA, vit D per nephrologist.

## 2017-09-29 NOTE — Assessment & Plan Note (Signed)
Improved with protein supplement.. Continue.

## 2017-09-29 NOTE — Patient Instructions (Signed)
Please stop at the front desk to set up referral.  

## 2017-10-13 ENCOUNTER — Encounter (HOSPITAL_COMMUNITY): Payer: Medicare Other

## 2017-10-13 ENCOUNTER — Ambulatory Visit: Payer: Medicare Other | Admitting: Vascular Surgery

## 2017-10-18 ENCOUNTER — Other Ambulatory Visit: Payer: Self-pay | Admitting: Family Medicine

## 2017-10-19 ENCOUNTER — Other Ambulatory Visit: Payer: Self-pay | Admitting: Family Medicine

## 2017-11-01 ENCOUNTER — Other Ambulatory Visit: Payer: Self-pay | Admitting: Family Medicine

## 2017-11-01 MED ORDER — FEBUXOSTAT 80 MG PO TABS: 80.0000 mg | ORAL_TABLET | Freq: Every day | ORAL | 1 refills | Status: DC

## 2017-11-01 NOTE — Telephone Encounter (Signed)
Copied from [place] [id number]. Topic: Quick Communication - Rx Refill/Question >> Nov 01, 2017 11:16 AM Scherrie Gerlach wrote: Medication:   Febuxostat (ULORIC) 80 MG TABS Pt states Dr Kathlene November prescribed this for her and will not refill unless she comes in for a visit. Pt states she does not need to see him all she needs is a refill. Does not want to see this provider anymore. Pt states Dr Diona Browner takes care of her now. Pt states she takes this to keep from getting gout.  Would like Dr Diona Browner to take over this med.  [place], [place] - [address] [phone] (Phone) [phone] (Fax)

## 2017-11-01 NOTE — Telephone Encounter (Signed)
Pt last annual 09/29/17.

## 2017-11-01 NOTE — Telephone Encounter (Signed)
Pt [facility] Pt voiced understanding.

## 2017-11-10 ENCOUNTER — Ambulatory Visit (HOSPITAL_COMMUNITY)
Admission: RE | Admit: 2017-11-10 | Discharge: 2017-11-10 | Disposition: A | Discharge status: Home / Self Care | Payer: Medicare Other | Source: Ambulatory Visit | Attending: Vascular Surgery | Admitting: Vascular Surgery

## 2017-11-10 ENCOUNTER — Encounter: Payer: Self-pay | Admitting: Vascular Surgery

## 2017-11-10 ENCOUNTER — Other Ambulatory Visit: Payer: Self-pay

## 2017-11-10 ENCOUNTER — Ambulatory Visit (INDEPENDENT_AMBULATORY_CARE_PROVIDER_SITE_OTHER): Payer: Medicare Other | Admitting: Vascular Surgery

## 2017-11-10 VITALS — BP 132/85 | HR 54 | Temp 97.0°F | Resp 20 | Ht 65.5 in | Wt 183.0 lb

## 2017-11-10 DIAGNOSIS — I6523 Occlusion and stenosis of bilateral carotid arteries: Secondary | ICD-10-CM | POA: Diagnosis not present

## 2017-11-10 DIAGNOSIS — I712 Thoracic aortic aneurysm, without rupture, unspecified: Secondary | ICD-10-CM

## 2017-11-10 DIAGNOSIS — I6521 Occlusion and stenosis of right carotid artery: Secondary | ICD-10-CM | POA: Diagnosis not present

## 2017-11-10 NOTE — Progress Notes (Signed)
Patient is an 84 year old female who returns for follow-up today regarding thoracic aneurysm and asymptomatic high-grade right internal carotid artery stenosis.  She is on aspirin Plavix and a statin.  She continues to deny any symptoms of TIA amaurosis or stroke.  She was not considered for carotid endarterectomy in the past due to cardiac dysfunction.  She was not deemed to be a carotid stent candidate primarily secondary to renal dysfunction.  She does have a known high carotid bifurcation as well.  She denies any chest or abdominal pain.  She states she overall has been doing well and is still very functional.  She broke both ankles back about 4 months ago but has recovered from this.  Review of systems: She denies shortness of breath.  She denies chest pain.    Current Outpatient Medications on File Prior to Visit  Medication Sig Dispense Refill  . acetaminophen (TYLENOL) 500 MG tablet Take 1,000 mg by mouth 2 (two) times daily as needed for pain.    Marland Kitchen aspirin EC 81 MG tablet Take 1 tablet (81 mg total) by mouth daily.  3  . atorvastatin (LIPITOR) 80 MG tablet TAKE 1 TABLET DAILY 90 tablet 0  . Cholecalciferol 1000 UNITS capsule Take 3,000 Units by mouth daily.     . clopidogrel (PLAVIX) 75 MG tablet TAKE 1 TABLET DAILY 90 tablet 0  . estradiol (ESTRACE VAGINAL) 0.1 MG/GM vaginal cream Place 2 g vaginally 2 (two) times a week. Thursday and Sunday    . Febuxostat (ULORIC) 80 MG TABS Take 1 tablet (80 mg total) by mouth daily. 90 tablet 1  . isosorbide mononitrate (IMDUR) 30 MG 24 hr tablet TAKE 1 TABLET TWICE A DAY 180 tablet 1  . levothyroxine (SYNTHROID, LEVOTHROID) 112 MCG tablet TAKE 1 TABLET DAILY 90 tablet 3  . metoprolol tartrate (LOPRESSOR) 25 MG tablet TAKE ONE-HALF (1/2) TABLET TWICE A DAY 90 tablet 3  . sertraline (ZOLOFT) 50 MG tablet TAKE 1 TABLET BY MOUTH EVERY DAY 90 tablet 1   No current facility-administered medications on file prior to visit.      Physical  exam:  Vitals:   11/10/17 1141 11/10/17 1145  BP: 134/87 132/85  Pulse: (!) 54   Resp: 20   Temp: (!) 97 F (36.1 C)   TempSrc: Oral   SpO2: 94%   Weight: 183 lb (83 kg)   Height: 5' 5.5" (1.664 m)     Neck: No carotid bruit  Chest: Clear to auscultation bilaterally  Cardiac: Regular rate and rhythm without murmur  Abdomen: Soft nontender nondistended vaguely palpable aortic pulsation  Extremities: 2+ femoral pulses bilaterally  Data: Patient had a carotid duplex exam today which showed greater than 80% right internal carotid artery stenosis velocities for 21/151 no significant left-sided carotid stenosis antegrade vertebral flow.  I reviewed her recent CT scan of the chest without contrast for approximately 1 week ago.  This showed thoracic aneurysm ascending diameter 4.5 cm descending 5.4 cm essentially unchanged  Assessment: #1 thoracic aortic aneurysm due to the patient's age she most likely is not a candidate for repair of this in the future but if it grew to more than 6 cm in diameter we would consider whether or not repair would be in her best interest  She needs a repeat chest CT without contrast in 1 year  2.  Greater than 80% asymptomatic right internal carotid artery stenosis remains asymptomatic high risk for repair of this with stent or carotid endarterectomy.  We  will continue surveillance ultrasound and only consider repair if she develops symptoms.  She will follow-up with a carotid duplex scan in 1 year.  [name], MD [facility] Office: [phone] Pager: [phone]

## 2017-11-14 ENCOUNTER — Ambulatory Visit
Admission: RE | Admit: 2017-11-14 | Discharge: 2017-11-14 | Disposition: A | Discharge status: Home / Self Care | Payer: Medicare Other | Source: Ambulatory Visit | Attending: Family Medicine | Admitting: Family Medicine

## 2017-11-14 DIAGNOSIS — M8580 Other specified disorders of bone density and structure, unspecified site: Secondary | ICD-10-CM | POA: Diagnosis not present

## 2017-11-14 DIAGNOSIS — M810 Age-related osteoporosis without current pathological fracture: Secondary | ICD-10-CM | POA: Insufficient documentation

## 2017-11-15 ENCOUNTER — Encounter: Payer: Self-pay | Admitting: Family Medicine

## 2017-11-15 DIAGNOSIS — M810 Age-related osteoporosis without current pathological fracture: Secondary | ICD-10-CM | POA: Insufficient documentation

## 2017-11-23 ENCOUNTER — Other Ambulatory Visit: Payer: Self-pay | Admitting: Family Medicine

## 2018-01-13 ENCOUNTER — Other Ambulatory Visit: Payer: Self-pay

## 2018-01-13 ENCOUNTER — Encounter: Payer: Self-pay | Admitting: Emergency Medicine

## 2018-01-13 ENCOUNTER — Ambulatory Visit
Admission: EM | Admit: 2018-01-13 | Discharge: 2018-01-13 | Disposition: A | Discharge status: Home / Self Care | Payer: Medicare Other | Attending: Family Medicine | Admitting: Family Medicine

## 2018-01-13 DIAGNOSIS — L989 Disorder of the skin and subcutaneous tissue, unspecified: Secondary | ICD-10-CM | POA: Diagnosis not present

## 2018-01-13 NOTE — ED Provider Notes (Signed)
[facility]    CSN: [id number] Arrival date & time: 01/13/18  1124  History   Chief Complaint Chief Complaint  Patient presents with  . knee lesion    left   HPI  84 year old female presents with a skin lesion.  Patient reports that she has had a raised skin lesion behind her left knee for the past week.  No reports of injury.  No ulceration.  She states that it slightly painful particularly with pressure/palpation.  No known inciting factor.  No relieving factors.  No other reported symptoms.  **Patient and daughter chronic scaly rash (diffuse).  Past Medical History:  Diagnosis Date  . AAA (abdominal aortic aneurysm) (HCC)    Recurrent; s/p repair, now 3.8 cam; h/o ischemic bowel 10-09-2004  . Aortic stenosis    Moderate; Echo 12/09. moderate AS mean 19. AVA 0.88  . Basal cell carcinoma of nose 1980s  . Bruises easily   . CAD (coronary artery disease)    s/p NSTEMI 1/07 with BMS to OM; cath 12/09 due to positive Myoview, LM ok. LAD 40%. LCX 80-90% mid. RCA chronically occluded with R --> L collats  . Carotid artery stenosis    R 60-79% L 0-39% (6/10) - stable  . Cataract   . CRI (chronic renal insufficiency)    Mild, Cr 1.3  . Depression 2004-10-09   With death of spouse  . Full dentures   . GERD (gastroesophageal reflux disease)   . HTN (hypertension)   . Hyperlipidemia    Increased LFTs on Vytorin. Changed to Lipitor.  . Hyperparathyroidism   . Hypothyroidism   . Incontinence   . Myocardial infarction ([place]) 10/09/2005  . PAD (peripheral artery disease) (HCC)    s/p iliac stents  . Renal artery stenosis (HCC)    Atrophic L kidney. Moderate blockage on R  . Ventral hernia    Moderate sized  . Vitamin D deficiency     Patient Active Problem List   Diagnosis Date Noted  . Osteoporosis 11/15/2017  . Protein-calorie malnutrition ([place]) 08/15/2017  . GAD (generalized anxiety disorder) 05/24/2017  . Major depressive disorder, recurrent episode, moderate ([place])  05/25/2016  . Counseling regarding end of life decision making 12/13/2014  . Descending thoracic aortic aneurysm ([place]) 05/30/2014  . Vitamin D deficiency 12/26/2012  . Uterine prolapse 06/23/2012  . Gout 06/06/2012  . INSOMNIA, CHRONIC, MILD 07/10/2010  . Carotid stenosis 11/01/2008  . Aortic valve stenosis 09/25/2008  . Abdominal aortic aneurysm ([place]) 09/24/2008  . LOW BACK PAIN, MILD 07/24/2008  . Coronary atherosclerosis 06/21/2008  . VENTRAL HERNIA 05/16/2008  . PULMONARY NODULE, RIGHT MIDDLE LOBE 02/01/2008  . CYSTOCELE WITH INCOMPLETE UTERINE PROLAPSE 02/20/2007  . Hypothyroidism 02/17/2007  . Hyperlipidemia 02/17/2007  . Essential hypertension 02/17/2007  . PAD (peripheral artery disease) ([place]) 02/17/2007  . GERD 02/17/2007  . CKD (chronic kidney disease) stage 3, GFR 30-59 ml/min (HCC) 02/17/2007  . ISCHEMIC COLITIS 05/19/2005    Past Surgical History:  Procedure Laterality Date  . ABDOMINAL AORTIC ANEURYSM REPAIR  10-09-1993, 07/01/2005  . ANGIOPLASTY  10/10/2003 - 07/01/2005  . APPENDECTOMY  1954  . breast biospy  1996  . BREAST SURGERY    . CATARACT EXTRACTION, BILATERAL    . CHOLECYSTECTOMY  07/01/2005  . CHOLECYSTECTOMY  1987  . COLONOSCOPY  07/07/2005   Colonic erosions, ulcers proctitis  . COLONOSCOPY W/ ENDOSCOPIC [place]  07/01/2005   [place] kidney diffuse atrophy  . CORONARY STENT PLACEMENT    .  CORONARY STENT PLACEMENT  07/2005   MI   . HERNIA REPAIR  1997  . HOSP  04/2005   ? abd. prob. mesenteric ischemia  . HOSP  01/2006   Hosp for reflux (chest pain)  . ILIAC ARTERY STENT  2005 - 07/01/2005   Stent placement, both legs - PVD in arteries  . NSTEMI  08/13/2005   1/07 s/p stent in circumflex  . TONSILLECTOMY  1943  . UPPER GASTROINTESTINAL ENDOSCOPY  07/07/2005   Nml    OB History   None      Home Medications    Prior to Admission medications   Medication Sig Start Date End Date Taking? Authorizing Provider  aspirin EC 81 MG tablet Take 1 tablet (81  mg total) by mouth daily. 09/27/17  Yes Minna Merritts, MD  atorvastatin (LIPITOR) 80 MG tablet TAKE 1 TABLET DAILY 11/23/17  Yes Bedsole, Amy E, MD  Cholecalciferol 1000 UNITS capsule Take 3,000 Units by mouth daily.    Yes [provider]  clopidogrel (PLAVIX) 75 MG tablet TAKE 1 TABLET DAILY 11/23/17  Yes Bedsole, Amy E, MD  Febuxostat (ULORIC) 80 MG TABS Take 1 tablet (80 mg total) by mouth daily. 11/01/17  Yes Bedsole, Amy E, MD  isosorbide mononitrate (IMDUR) 30 MG 24 hr tablet TAKE 1 TABLET TWICE A DAY 06/13/17  Yes Gollan, Kathlene November, MD  levothyroxine (SYNTHROID, LEVOTHROID) 112 MCG tablet TAKE 1 TABLET DAILY 10/19/17  Yes Bedsole, Amy E, MD  metoprolol tartrate (LOPRESSOR) 25 MG tablet TAKE ONE-HALF (1/2) TABLET TWICE A DAY 03/28/17  Yes Gollan, Kathlene November, MD  sertraline (ZOLOFT) 50 MG tablet TAKE 1 TABLET BY MOUTH EVERY DAY 10/18/17  Yes Bedsole, Amy E, MD  acetaminophen (TYLENOL) 500 MG tablet Take 1,000 mg by mouth 2 (two) times daily as needed for pain.    [provider]  estradiol (ESTRACE VAGINAL) 0.1 MG/GM vaginal cream Place 2 g vaginally 2 (two) times a week. Thursday and Sunday    [provider]    Family History Family History  Problem Relation Age of Onset  . Heart attack Mother        MI  . Cancer Mother   . Heart disease Mother        heart attack  . Stroke Mother   . Cancer Father        throat  . Heart attack Father        MI  . Throat cancer Father   . Heart disease Father        heart attack   . Stroke Father   . Cancer Sister        breast  . Breast cancer Sister   . Hyperlipidemia Sister   . Hyperlipidemia Sister   . Hypertension Sister   . Hyperlipidemia Son   . Colon cancer Neg Hx     Social History Social History   Tobacco Use  . Smoking status: Former Smoker    Last attempt to quit: 07/12/2005    Years since quitting: 12.5  . Smokeless tobacco: Never Used  . Tobacco comment: quit in 1997  Substance Use Topics  .  Alcohol use: No  . Drug use: No     Allergies   Naproxen; Tramadol; Codeine; Morphine sulfate; and Sulfonamide derivatives   Review of Systems Review of Systems  Constitutional: Negative.   Skin:       Skin lesion.   Physical Exam Triage Vital Signs ED Triage Vitals  Enc Vitals Group     BP 01/13/18 1136 (!) 127/94     Pulse Rate 01/13/18 1136 (!) 55     Resp 01/13/18 1136 16     Temp 01/13/18 1136 98 F (36.7 C)     Temp Source 01/13/18 1136 Oral     SpO2 01/13/18 1136 96 %     Weight 01/13/18 1137 180 lb (81.6 kg)     Height 01/13/18 1137 5\' 6"  (1.676 m)     Head Circumference --      Peak Flow --      Pain Score 01/13/18 1137 0     Pain Loc --      Pain Edu? --      Excl. in [place]? --    Updated Vital Signs BP (!) 127/94 (BP Location: Left Arm)   Pulse (!) 55   Temp 98 F (36.7 C) (Oral)   Resp 16   Ht 5\' 6"  (1.676 m)   Wt 180 lb (81.6 kg)   SpO2 96%   BMI 29.05 kg/m   Visual Acuity Right Eye Distance:   Left Eye Distance:   Bilateral Distance:    Right Eye Near:   Left Eye Near:    Bilateral Near:     Physical Exam  Constitutional: She is oriented to person, place, and time. She appears well-developed. No distress.  HENT:  Head: Normocephalic and atraumatic.  Pulmonary/Chest: Effort normal. No respiratory distress.  Neurological: She is alert and oriented to person, place, and time.  Skin:  Patient has a diffuse dry, erythematous rash.  Raised, flesh-colored papule noted behind the left knee.  No ulceration.  Slightly tender to palpation.  Psychiatric: She has a normal mood and affect. Her behavior is normal.  Nursing note and vitals reviewed.  UC Treatments / Results  Labs (all labs ordered are listed, but only abnormal results are displayed) Labs Reviewed - No data to display  EKG None  Radiology No results found.  Procedures Procedures (including critical care time)  Medications Ordered in UC Medications - No data to  display  Initial Impression / Assessment and Plan / UC Course  I have reviewed the triage vital signs and the nursing notes.  Pertinent labs & imaging results that were available during my care of the patient were reviewed by me and considered in my medical decision making (see chart for details).    84 year old female presents with a skin lesion.  Appears benign.  Advised to see dermatology for removal.  Regarding her chronic rash, I also advised her to get a second opinion from another dermatologist.  Final Clinical Impressions(s) / UC Diagnoses   Final diagnoses:  Skin lesion     Discharge Instructions     I recommend [facility]: Address: [address] [place], [place] [address]  Phone: [phone]  Give them a call. I recommend Dr. Marolyn Hammock.  Take care  Dr. Lacinda Axon    ED Prescriptions    None     Controlled Substance Prescriptions [place] Controlled Substance Registry consulted? Not Applicable   Coral Spikes, DO 01/13/18 1227

## 2018-01-13 NOTE — ED Triage Notes (Signed)
Patient in today c/o an area behind her left knee that is swollen and sore x 1 week. Patient states it is getting bigger.

## 2018-01-13 NOTE — Discharge Instructions (Signed)
I recommend [facility]: Address: [address] [place], [place] [address]  Phone: [phone]  Give them a call. I recommend Dr. Marolyn Hammock.  Take care  Dr. Lacinda Axon

## 2018-02-07 ENCOUNTER — Other Ambulatory Visit: Payer: Self-pay | Admitting: Cardiovascular Disease

## 2018-02-10 ENCOUNTER — Telehealth: Payer: Self-pay | Admitting: Family Medicine

## 2018-02-10 NOTE — Telephone Encounter (Signed)
Copied from [place] [id number]. Topic: Quick Communication - Rx Refill/Question >> Feb 10, 2018  1:25 PM Selinda Flavin B, NT wrote: Medication: colestipol (COLESTID) 1 g tablet  Has the patient contacted their pharmacy? Yes.   (Agent: If no, request that the patient contact the pharmacy for the refill.) (Agent: If yes, when and what did the pharmacy advise?)  Preferred Pharmacy (with phone number or street name): [facility]  Agent: Please be advised that RX refills may take up to 3 business days. We ask that you follow-up with your pharmacy.

## 2018-02-10 NOTE — Telephone Encounter (Signed)
Colestipol (Colestid) 1g refill. Medication not on pt's current medication list  Last OV: 09/29/17 PCP: Dr. Diona Browner [facility]: [facility]

## 2018-02-14 MED ORDER — COLESTIPOL HCL 1 G PO TABS
2.0000 g | ORAL_TABLET | Freq: Two times a day (BID) | ORAL | 3 refills | Status: DC
Start: 2018-02-14 — End: 2019-01-19

## 2018-02-14 NOTE — Telephone Encounter (Signed)
Spoke with [name].  She states she is taking it for cholesterol.  She states her vascular doctors are the ones that wanted her to stay on it.    Refill?

## 2018-02-14 NOTE — Telephone Encounter (Signed)
Refill for 1 year 

## 2018-02-14 NOTE — Telephone Encounter (Signed)
Call pt.. Has she been taking this.. If so what for?

## 2018-03-03 ENCOUNTER — Telehealth: Payer: Self-pay | Admitting: Pharmacist

## 2018-03-03 NOTE — Progress Notes (Signed)
Tried calling patient to discuss Uloric therapy (recent FDA warning to avoid due to CV risk). Unable to reach patient x 2, left messages.  Can try to switch from Uloric to allopurinol in the future. In patients with CKD, allopurinol can be initiated at 50 mg once daily and titrated cautiously to optimal serum uric acid level and gout control (ACR Josie Saunders 2012a).

## 2018-03-06 ENCOUNTER — Other Ambulatory Visit: Payer: Self-pay

## 2018-03-06 ENCOUNTER — Other Ambulatory Visit: Payer: Self-pay | Admitting: Cardiovascular Disease

## 2018-03-06 MED ORDER — METOPROLOL TARTRATE 25 MG PO TABS: ORAL_TABLET | ORAL | 3 refills | Status: DC

## 2018-04-02 ENCOUNTER — Emergency Department: Payer: Medicare Other

## 2018-04-02 ENCOUNTER — Encounter: Payer: Self-pay | Admitting: Emergency Medicine

## 2018-04-02 DIAGNOSIS — I129 Hypertensive chronic kidney disease with stage 1 through stage 4 chronic kidney disease, or unspecified chronic kidney disease: Secondary | ICD-10-CM | POA: Diagnosis not present

## 2018-04-02 DIAGNOSIS — Z87891 Personal history of nicotine dependence: Secondary | ICD-10-CM | POA: Insufficient documentation

## 2018-04-02 DIAGNOSIS — W010XXA Fall on same level from slipping, tripping and stumbling without subsequent striking against object, initial encounter: Secondary | ICD-10-CM | POA: Insufficient documentation

## 2018-04-02 DIAGNOSIS — N183 Chronic kidney disease, stage 3 (moderate): Secondary | ICD-10-CM | POA: Insufficient documentation

## 2018-04-02 DIAGNOSIS — Y92009 Unspecified place in unspecified non-institutional (private) residence as the place of occurrence of the external cause: Secondary | ICD-10-CM | POA: Insufficient documentation

## 2018-04-02 DIAGNOSIS — S5012XA Contusion of left forearm, initial encounter: Secondary | ICD-10-CM | POA: Diagnosis not present

## 2018-04-02 DIAGNOSIS — S0990XA Unspecified injury of head, initial encounter: Secondary | ICD-10-CM | POA: Insufficient documentation

## 2018-04-02 DIAGNOSIS — Y93E9 Activity, other interior property and clothing maintenance: Secondary | ICD-10-CM | POA: Diagnosis not present

## 2018-04-02 DIAGNOSIS — Z7982 Long term (current) use of aspirin: Secondary | ICD-10-CM | POA: Insufficient documentation

## 2018-04-02 DIAGNOSIS — Z7901 Long term (current) use of anticoagulants: Secondary | ICD-10-CM | POA: Insufficient documentation

## 2018-04-02 DIAGNOSIS — Y999 Unspecified external cause status: Secondary | ICD-10-CM | POA: Insufficient documentation

## 2018-04-02 DIAGNOSIS — S51012A Laceration without foreign body of left elbow, initial encounter: Secondary | ICD-10-CM | POA: Insufficient documentation

## 2018-04-02 DIAGNOSIS — E039 Hypothyroidism, unspecified: Secondary | ICD-10-CM | POA: Diagnosis not present

## 2018-04-02 DIAGNOSIS — I2510 Atherosclerotic heart disease of native coronary artery without angina pectoris: Secondary | ICD-10-CM | POA: Insufficient documentation

## 2018-04-02 NOTE — ED Triage Notes (Signed)
Patient states that she was cleaning and tripped over a chair. Patient with complaint of left arm pain. Patient with large hematoma to left lower arm and skin tear to left upper arm with bleeding controlled. Patient states that she did hit her head but denies LOC. Patient states that she takes Plavix.

## 2018-04-03 ENCOUNTER — Emergency Department: Payer: Medicare Other

## 2018-04-03 ENCOUNTER — Emergency Department
Admission: EM | Admit: 2018-04-03 | Discharge: 2018-04-03 | Disposition: A | Discharge status: Home / Self Care | Payer: Medicare Other | Attending: Emergency Medicine | Admitting: Emergency Medicine

## 2018-04-03 DIAGNOSIS — S5012XA Contusion of left forearm, initial encounter: Secondary | ICD-10-CM

## 2018-04-03 DIAGNOSIS — S51012A Laceration without foreign body of left elbow, initial encounter: Secondary | ICD-10-CM

## 2018-04-03 MED ORDER — ACETAMINOPHEN 325 MG PO TABS
650.0000 mg | ORAL_TABLET | Freq: Once | ORAL | Status: AC
  Administered 2018-04-03: 650 mg via ORAL
  Filled 2018-04-03: qty 2

## 2018-04-03 NOTE — ED Provider Notes (Signed)
[facility] Emergency Department Provider Note   None    (approximate)  I have reviewed the triage vital signs and the nursing notes.   HISTORY  Chief Complaint Fall    HPI [name] is a 84 y.o. female with below list of chronic medical conditions presents to the emergency departmentpresent in emergency Department history of accidental trip and fall with resultant left forearm injury   Past Medical History:  Diagnosis Date  . AAA (abdominal aortic aneurysm) (HCC)    Recurrent; s/p repair, now 3.8 cam; h/o ischemic bowel 2004/10/22  . Aortic stenosis    Moderate; Echo 12/09. moderate AS mean 19. AVA 0.88  . Basal cell carcinoma of nose 1980s  . Bruises easily   . CAD (coronary artery disease)    s/p NSTEMI 1/07 with BMS to OM; cath 12/09 due to positive Myoview, LM ok. LAD 40%. LCX 80-90% mid. RCA chronically occluded with R --> L collats  . Carotid artery stenosis    R 60-79% L 0-39% (6/10) - stable  . Cataract   . CRI (chronic renal insufficiency)    Mild, Cr 1.3  . Depression 10/22/2004   With death of spouse  . Full dentures   . GERD (gastroesophageal reflux disease)   . HTN (hypertension)   . Hyperlipidemia    Increased LFTs on Vytorin. Changed to Lipitor.  . Hyperparathyroidism   . Hypothyroidism   . Incontinence   . Myocardial infarction ([place]) 10/22/05  . PAD (peripheral artery disease) (HCC)    s/p iliac stents  . Renal artery stenosis (HCC)    Atrophic L kidney. Moderate blockage on R  . Ventral hernia    Moderate sized  . Vitamin D deficiency     Patient Active Problem List   Diagnosis Date Noted  . Osteoporosis 11/15/2017  . Protein-calorie malnutrition ([place]) 08/15/2017  . GAD (generalized anxiety disorder) 05/24/2017  . Major depressive disorder, recurrent episode, moderate ([place]) 05/25/2016  . Counseling regarding end of life decision making 12/13/2014  . Descending thoracic aortic aneurysm ([place]) 05/30/2014  . Vitamin D  deficiency 12/26/2012  . Uterine prolapse 06/23/2012  . Gout 06/06/2012  . INSOMNIA, CHRONIC, MILD 07/10/2010  . Carotid stenosis 11/01/2008  . Aortic valve stenosis 09/25/2008  . Abdominal aortic aneurysm ([place]) 09/24/2008  . LOW BACK PAIN, MILD 07/24/2008  . Coronary atherosclerosis 06/21/2008  . VENTRAL HERNIA 05/16/2008  . PULMONARY NODULE, RIGHT MIDDLE LOBE 02/01/2008  . CYSTOCELE WITH INCOMPLETE UTERINE PROLAPSE 02/20/2007  . Hypothyroidism 02/17/2007  . Hyperlipidemia 02/17/2007  . Essential hypertension 02/17/2007  . PAD (peripheral artery disease) ([place]) 02/17/2007  . GERD 02/17/2007  . CKD (chronic kidney disease) stage 3, GFR 30-59 ml/min (HCC) 02/17/2007  . ISCHEMIC COLITIS 05/19/2005    Past Surgical History:  Procedure Laterality Date  . ABDOMINAL AORTIC ANEURYSM REPAIR  Oct 22, 1993, 07/01/2005  . ANGIOPLASTY  10/23/03 - 07/01/2005  . APPENDECTOMY  1954  . breast biospy  1996  . BREAST SURGERY    . CATARACT EXTRACTION, BILATERAL    . CHOLECYSTECTOMY  07/01/2005  . CHOLECYSTECTOMY  1987  . COLONOSCOPY  07/07/2005   Colonic erosions, ulcers proctitis  . COLONOSCOPY W/ ENDOSCOPIC [place]  07/01/2005   [place] kidney diffuse atrophy  . CORONARY STENT PLACEMENT    . CORONARY STENT PLACEMENT  07/2005   MI   . HERNIA REPAIR  1997  . HOSP  04/2005   ? abd. prob. mesenteric ischemia  . HOSP  01/2006  Hosp for reflux (chest pain)  . ILIAC ARTERY STENT  2005 - 07/01/2005   Stent placement, both legs - PVD in arteries  . NSTEMI  08/13/2005   1/07 s/p stent in circumflex  . TONSILLECTOMY  1943  . UPPER GASTROINTESTINAL ENDOSCOPY  07/07/2005   Nml    Prior to Admission medications   Medication Sig Start Date End Date Taking? Authorizing Provider  acetaminophen (TYLENOL) 500 MG tablet Take 1,000 mg by mouth 2 (two) times daily as needed for pain.    [provider]  aspirin EC 81 MG tablet Take 1 tablet (81 mg total) by mouth daily. 09/27/17   Minna Merritts, MD    atorvastatin (LIPITOR) 80 MG tablet TAKE 1 TABLET DAILY 11/23/17   Bedsole, Amy E, MD  Cholecalciferol 1000 UNITS capsule Take 3,000 Units by mouth daily.     [provider]  clopidogrel (PLAVIX) 75 MG tablet TAKE 1 TABLET DAILY 11/23/17   Bedsole, Amy E, MD  colestipol (COLESTID) 1 g tablet Take 2 tablets (2 g total) by mouth 2 (two) times daily. 02/14/18   Bedsole, Amy E, MD  estradiol (ESTRACE VAGINAL) 0.1 MG/GM vaginal cream Place 2 g vaginally 2 (two) times a week. Thursday and Sunday    [provider]  Febuxostat (ULORIC) 80 MG TABS Take 1 tablet (80 mg total) by mouth daily. 11/01/17   Bedsole, Amy E, MD  isosorbide mononitrate (IMDUR) 30 MG 24 hr tablet TAKE 1 TABLET TWICE A DAY 02/07/18   Gollan, Kathlene November, MD  levothyroxine (SYNTHROID, LEVOTHROID) 112 MCG tablet TAKE 1 TABLET DAILY 10/19/17   Bedsole, Amy E, MD  metoprolol tartrate (LOPRESSOR) 25 MG tablet TAKE ONE-HALF (1/2) TABLET TWICE A DAY 03/06/18   Minna Merritts, MD  sertraline (ZOLOFT) 50 MG tablet TAKE 1 TABLET BY MOUTH EVERY DAY 10/18/17   Bedsole, Amy E, MD    Allergies Naproxen; Tramadol; Codeine; Morphine sulfate; and Sulfonamide derivatives  Family History  Problem Relation Age of Onset  . Heart attack Mother        MI  . Cancer Mother   . Heart disease Mother        heart attack  . Stroke Mother   . Cancer Father        throat  . Heart attack Father        MI  . Throat cancer Father   . Heart disease Father        heart attack   . Stroke Father   . Cancer Sister        breast  . Breast cancer Sister   . Hyperlipidemia Sister   . Hyperlipidemia Sister   . Hypertension Sister   . Hyperlipidemia Son   . Colon cancer Neg Hx     Social History Social History   Tobacco Use  . Smoking status: Former Smoker    Last attempt to quit: 07/12/2005    Years since quitting: 12.7  . Smokeless tobacco: Never Used  . Tobacco comment: quit in 1997  Substance Use Topics  . Alcohol use: No  . Drug  use: No    Review of Systems Constitutional: No fever/chills Eyes: No visual changes. ENT: No sore throat. Cardiovascular: Denies chest pain. Respiratory: Denies shortness of breath. Gastrointestinal: No abdominal pain.  No nausea, no vomiting.  No diarrhea.  No constipation. Genitourinary: Negative for dysuria. Musculoskeletal: Negative for neck pain.  Negative for back pain.  Positive for left forearm pain and  swelling. Integumentary: Negative for rash. Neurological: Negative for headaches, focal weakness or numbness.   ____________________________________________   PHYSICAL EXAM:  VITAL SIGNS: ED Triage Vitals  Enc Vitals Group     BP 04/02/18 2204 (!) 145/77     Pulse Rate 04/02/18 2204 62     Resp 04/02/18 2204 18     Temp 04/02/18 2204 97.7 F (36.5 C)     Temp Source 04/02/18 2204 Oral     SpO2 04/02/18 2204 96 %     Weight 04/02/18 2207 81.6 kg (180 lb)     Height 04/02/18 2207 1.676 m (5\' 6" )     Head Circumference --      Peak Flow --      Pain Score 04/02/18 2213 8     Pain Loc --      Pain Edu? --      Excl. in [place]? --     Constitutional: Alert and oriented. Well appearing and in no acute distress. Eyes: Conjunctivae are normal. PERRL. EOMI. Head: Atraumatic. Mouth/Throat: Mucous membranes are moist.  Oropharynx non-erythematous. Neck: No stridor.  No meningeal signs.  No cervical spine tenderness to palpation. Cardiovascular: Normal rate, regular rhythm. Good peripheral circulation. Grossly normal heart sounds. Respiratory: Normal respiratory effort.  No retractions. Lungs CTAB. Gastrointestinal: Soft and nontender. No distention.  Musculoskeletal: Left mid forearm ecchymoses with associated swelling tender to touch. Neurologic:  Normal speech and language. No gross focal neurologic deficits are appreciated.  Skin:  Skin is warm, dry and intact. No rash noted. Psychiatric: Mood and affect are normal. Speech and behavior are  normal.  _____  RADIOLOGY I, [place], personally viewed and evaluated these images (plain radiographs) as part of my medical decision making, as well as reviewing the written report by the radiologist.  ED MD interpretation: No acute fracture or dislocation noted of the left forearm.  Official radiology report(s): Dg Forearm Left  Result Date: 04/03/2018 CLINICAL DATA:  84 y/o  F; left forearm pain after fall. EXAM: LEFT FOREARM - 2 VIEW COMPARISON:  None. FINDINGS: There is no evidence of fracture or other focal bone lesions. Swelling of the mid forearm soft tissues. Normal radiocarpal and elbow joint alignment. IMPRESSION: No acute fracture or dislocation identified. Swelling of mid forearm soft tissues. Electronically Signed   By: Kristine Garbe M.D.   On: 04/03/2018 00:17   Ct Head Wo Contrast  Result Date: 04/02/2018 CLINICAL DATA:  Trip and fall with head injury. EXAM: CT HEAD WITHOUT CONTRAST CT CERVICAL SPINE WITHOUT CONTRAST TECHNIQUE: Multidetector CT imaging of the head and cervical spine was performed following the standard protocol without intravenous contrast. Multiplanar CT image reconstructions of the cervical spine were also generated. COMPARISON:  08/23/2011 neck CT. FINDINGS: CT HEAD FINDINGS Brain: Small right caudate head lacune. No evidence of parenchymal hemorrhage or extra-axial fluid collection. No mass lesion, mass effect, or midline shift. No CT evidence of acute infarction. Generalized cerebral volume loss. Nonspecific mild subcortical and periventricular white matter hypodensity, most in keeping with chronic small vessel ischemic change. No ventriculomegaly. Vascular: No acute abnormality. Skull: No evidence of calvarial fracture. Sinuses/Orbits: The visualized paranasal sinuses are essentially clear. Other:  The mastoid air cells are unopacified. CT CERVICAL SPINE FINDINGS Alignment: Straightening of the cervical spine. No facet subluxation. Dens is  well positioned between the lateral masses of C1. Minimal 2 mm anterolisthesis at C2-3 and C3-4, unchanged. Skull base and vertebrae: No acute fracture. No primary bone lesion or focal  pathologic process. Soft tissues and spinal canal: No prevertebral edema. No visible canal hematoma. Disc levels: Moderate multilevel degenerative disc disease throughout the cervical spine, most prominent at C4-5. Moderate bilateral facet arthropathy. Moderate degenerative foraminal stenosis on the left at C3-4. Upper chest: No acute abnormality. The superior margin of the distal aortic arch is visualized in the upper left chest. Other: Visualized mastoid air cells appear clear. No discrete thyroid nodules. No pathologically enlarged cervical nodes. IMPRESSION: CT HEAD: 1. No evidence of acute intracranial abnormality. No evidence of calvarial fracture. 2. Generalized cerebral volume loss with mild chronic small vessel ischemic change in the cerebral white matter. 3. Small right caudate head lacune. CT CERVICAL SPINE: 1. No cervical spine fracture or acute malalignment. 2. Moderate multilevel cervical degenerative changes as detailed. Electronically Signed   By: Ilona Sorrel M.D.   On: 04/02/2018 23:09   Ct Cervical Spine Wo Contrast  Result Date: 04/02/2018 CLINICAL DATA:  Trip and fall with head injury. EXAM: CT HEAD WITHOUT CONTRAST CT CERVICAL SPINE WITHOUT CONTRAST TECHNIQUE: Multidetector CT imaging of the head and cervical spine was performed following the standard protocol without intravenous contrast. Multiplanar CT image reconstructions of the cervical spine were also generated. COMPARISON:  08/23/2011 neck CT. FINDINGS: CT HEAD FINDINGS Brain: Small right caudate head lacune. No evidence of parenchymal hemorrhage or extra-axial fluid collection. No mass lesion, mass effect, or midline shift. No CT evidence of acute infarction. Generalized cerebral volume loss. Nonspecific mild subcortical and periventricular white  matter hypodensity, most in keeping with chronic small vessel ischemic change. No ventriculomegaly. Vascular: No acute abnormality. Skull: No evidence of calvarial fracture. Sinuses/Orbits: The visualized paranasal sinuses are essentially clear. Other:  The mastoid air cells are unopacified. CT CERVICAL SPINE FINDINGS Alignment: Straightening of the cervical spine. No facet subluxation. Dens is well positioned between the lateral masses of C1. Minimal 2 mm anterolisthesis at C2-3 and C3-4, unchanged. Skull base and vertebrae: No acute fracture. No primary bone lesion or focal pathologic process. Soft tissues and spinal canal: No prevertebral edema. No visible canal hematoma. Disc levels: Moderate multilevel degenerative disc disease throughout the cervical spine, most prominent at C4-5. Moderate bilateral facet arthropathy. Moderate degenerative foraminal stenosis on the left at C3-4. Upper chest: No acute abnormality. The superior margin of the distal aortic arch is visualized in the upper left chest. Other: Visualized mastoid air cells appear clear. No discrete thyroid nodules. No pathologically enlarged cervical nodes. IMPRESSION: CT HEAD: 1. No evidence of acute intracranial abnormality. No evidence of calvarial fracture. 2. Generalized cerebral volume loss with mild chronic small vessel ischemic change in the cerebral white matter. 3. Small right caudate head lacune. CT CERVICAL SPINE: 1. No cervical spine fracture or acute malalignment. 2. Moderate multilevel cervical degenerative changes as detailed. Electronically Signed   By: Ilona Sorrel M.D.   On: 04/02/2018 23:09      Procedures   ____________________________________________   INITIAL IMPRESSION / ASSESSMENT AND PLAN / ED COURSE  As part of my medical decision making, I reviewed the following data within the electronic MEDICAL RECORD NUMBER   84 year old female presenting with above-stated history and physical exam secondary to accidental trip  and fall with resultant left forearm injury.  X-ray revealed no evidence of fracture dislocation suspect hematoma to the left forearm ice applied.  Patient requested Tylenol only for her pain.  Spoke with the patient and her daughter at length regarding warning signs that would warrant immediate return ____________________________________________  FINAL CLINICAL IMPRESSION(S) /  ED DIAGNOSES  Final diagnoses:  Skin tear of left elbow without complication, initial encounter  Contusion of left forearm, initial encounter     MEDICATIONS GIVEN DURING THIS VISIT:  Medications  acetaminophen (TYLENOL) tablet 650 mg (has no administration in time range)     ED Discharge Orders    None       Note:  This document was prepared using Dragon voice recognition software and may include unintentional dictation errors.    Gregor Hams, MD 04/03/18 [phone]

## 2018-04-03 NOTE — ED Notes (Signed)
Steri strips applied to left arm. Pt tolerated well. Pt and family educated on care of extremity of skin.

## 2018-04-04 ENCOUNTER — Ambulatory Visit: Payer: Medicare Other | Admitting: Family Medicine

## 2018-04-11 ENCOUNTER — Other Ambulatory Visit: Payer: Self-pay | Admitting: Family Medicine

## 2018-04-14 ENCOUNTER — Ambulatory Visit (INDEPENDENT_AMBULATORY_CARE_PROVIDER_SITE_OTHER): Payer: Medicare Other | Admitting: Family Medicine

## 2018-04-14 ENCOUNTER — Encounter: Payer: Self-pay | Admitting: Family Medicine

## 2018-04-14 VITALS — BP 130/90 | HR 60 | Temp 97.0°F | Ht 65.5 in | Wt 184.0 lb

## 2018-04-14 DIAGNOSIS — F331 Major depressive disorder, recurrent, moderate: Secondary | ICD-10-CM | POA: Diagnosis not present

## 2018-04-14 DIAGNOSIS — I10 Essential (primary) hypertension: Secondary | ICD-10-CM

## 2018-04-14 DIAGNOSIS — M1A9XX0 Chronic gout, unspecified, without tophus (tophi): Secondary | ICD-10-CM | POA: Diagnosis not present

## 2018-04-14 DIAGNOSIS — Z23 Encounter for immunization: Secondary | ICD-10-CM

## 2018-04-14 DIAGNOSIS — E782 Mixed hyperlipidemia: Secondary | ICD-10-CM

## 2018-04-14 DIAGNOSIS — N183 Chronic kidney disease, stage 3 unspecified: Secondary | ICD-10-CM

## 2018-04-14 DIAGNOSIS — E039 Hypothyroidism, unspecified: Secondary | ICD-10-CM

## 2018-04-14 MED ORDER — ALLOPURINOL 100 MG PO TABS: 50.0000 mg | ORAL_TABLET | Freq: Every day | ORAL | 11 refills | Status: DC

## 2018-04-14 NOTE — Assessment & Plan Note (Signed)
Well controlled. Continue current medication.  

## 2018-04-14 NOTE — Progress Notes (Signed)
Subjective:    Patient ID: [name], female    DOB: 16-Apr-1928, 84 y.o.   MRN: [id number]  HPI   84 year old female presents for 6 month follow up.   AS, CS TAA , PAD followed by vascular and cardiology. On plavix. CAD followed by cards. HAs upcoming OV with carbs.  Hypertension:   Good control on metoprolol,   BP Readings from Last 3 Encounters:  04/14/18 130/90  04/03/18 (!) 175/85  01/13/18 (!) 127/94  Using medication without problems or lightheadedness: none Chest pain with exertion:none Edema:none Short of breath: none Average home BPs: not checking Other issues:   High cholesterol:  At goal last check on atorvastatin   CKD followed by Dr. Zara Chess   1 week ago she fell again. She tripped.. No proceeding symptoms.  She has had recurrent falls and is unsteady on her feet.  She uses a cane occ.. The fall occurred at home.. Tripped on chair leg.  She is on uloric for gout. She has CAD and there is a new black box warning for uloric. We need to change this to low dose allopurinol.  MDD, stable control on sertraline 50 mg daily.  Hypothyroid  Lab Results  Component Value Date   TSH 0.28 (L) 09/19/2017  nml t3 and t4  Blood pressure 130/90, pulse 60, temperature (!) 97 F (36.1 C), temperature source Oral, height 5' 5.5" (1.664 m), weight 184 lb (83.5 kg). Social History /Family History/Past Medical History reviewed in detail and updated in EMR if needed. Wt Readings from Last 3 Encounters:  04/14/18 184 lb (83.5 kg)  04/02/18 180 lb (81.6 kg)  01/13/18 180 lb (81.6 kg)    Review of Systems  Constitutional: Negative for fatigue and fever.  HENT: Negative for congestion.   Eyes: Negative for pain.  Respiratory: Negative for cough and shortness of breath.   Cardiovascular: Negative for chest pain, palpitations and leg swelling.  Gastrointestinal: Negative for abdominal pain.  Genitourinary: Negative for dysuria and vaginal bleeding.    Musculoskeletal: Negative for back pain.  Neurological: Negative for syncope, light-headedness and headaches.  Psychiatric/Behavioral: Negative for dysphoric mood.       Objective:   Physical Exam  Constitutional: Vital signs are normal. She appears well-developed and well-nourished. She is cooperative.  Non-toxic appearance. She does not appear ill. No distress.  HENT:  Head: Normocephalic.  Right Ear: Hearing, tympanic membrane, external ear and ear canal normal. Tympanic membrane is not erythematous, not retracted and not bulging.  Left Ear: Hearing, tympanic membrane, external ear and ear canal normal. Tympanic membrane is not erythematous, not retracted and not bulging.  Nose: No mucosal edema or rhinorrhea. Right sinus exhibits no maxillary sinus tenderness and no frontal sinus tenderness. Left sinus exhibits no maxillary sinus tenderness and no frontal sinus tenderness.  Mouth/Throat: Uvula is midline, oropharynx is clear and moist and mucous membranes are normal.  Eyes: Pupils are equal, round, and reactive to light. Conjunctivae, EOM and lids are normal. Lids are everted and swept, no foreign bodies found.  Neck: Trachea normal and normal range of motion. Neck supple. Carotid bruit is not present. No thyroid mass and no thyromegaly present.  Cardiovascular: Normal rate, regular rhythm, S1 normal, S2 normal, intact distal pulses and normal pulses. Exam reveals no gallop and no friction rub.  Murmur heard.  Systolic murmur is present with a grade of 3/6. Pulmonary/Chest: Effort normal and breath sounds normal. No tachypnea. No respiratory distress. She has no  decreased breath sounds. She has no wheezes. She has no rhonchi. She has no rales.  Abdominal: Soft. Normal appearance and bowel sounds are normal. There is no tenderness.  Neurological: She is alert.  Skin: Skin is warm, dry and intact. No rash noted.  Psychiatric: Judgment and thought content normal. Her mood appears not  anxious. Her speech is not rapid and/or pressured. She is not agitated and not withdrawn. Cognition and memory are normal. She does not exhibit a depressed mood. She expresses no homicidal and no suicidal ideation. She expresses no suicidal plans and no homicidal plans.   Smiling and happy at appt.           Assessment & Plan:

## 2018-04-14 NOTE — Patient Instructions (Signed)
Stop uloric.  Start low dose allopurinol at 50 mg daily.  Return in 4 weeks for lab only visit for uric acid.

## 2018-04-14 NOTE — Assessment & Plan Note (Signed)
Followed by Dr. Zara Chess.

## 2018-04-14 NOTE — Assessment & Plan Note (Signed)
Stop uloric given black box and start allopurinol low dose ( GFR> 30) and check uric acid in 4 weeks.

## 2018-04-18 ENCOUNTER — Other Ambulatory Visit: Payer: Self-pay

## 2018-04-18 ENCOUNTER — Ambulatory Visit (INDEPENDENT_AMBULATORY_CARE_PROVIDER_SITE_OTHER): Payer: Medicare Other

## 2018-04-18 DIAGNOSIS — I350 Nonrheumatic aortic (valve) stenosis: Secondary | ICD-10-CM | POA: Diagnosis not present

## 2018-07-04 ENCOUNTER — Other Ambulatory Visit: Payer: Self-pay | Admitting: Family Medicine

## 2018-08-06 ENCOUNTER — Other Ambulatory Visit: Payer: Self-pay | Admitting: Cardiovascular Disease

## 2018-09-21 ENCOUNTER — Other Ambulatory Visit: Payer: Self-pay | Admitting: Family Medicine

## 2018-09-22 ENCOUNTER — Telehealth: Payer: Self-pay | Admitting: Family Medicine

## 2018-09-22 ENCOUNTER — Ambulatory Visit: Payer: Medicare Other

## 2018-09-22 DIAGNOSIS — M810 Age-related osteoporosis without current pathological fracture: Secondary | ICD-10-CM

## 2018-09-22 DIAGNOSIS — E559 Vitamin D deficiency, unspecified: Secondary | ICD-10-CM

## 2018-09-22 DIAGNOSIS — E039 Hypothyroidism, unspecified: Secondary | ICD-10-CM

## 2018-09-22 DIAGNOSIS — E782 Mixed hyperlipidemia: Secondary | ICD-10-CM

## 2018-09-22 DIAGNOSIS — M1A9XX0 Chronic gout, unspecified, without tophus (tophi): Secondary | ICD-10-CM

## 2018-09-22 NOTE — Telephone Encounter (Signed)
-----   Message from Eustace Pen, LPN sent at [phone]/75/8832  3:26 PM EDT ----- Regarding: 3/13 - labs Lab orders needed. Thank you.

## 2018-09-22 NOTE — Telephone Encounter (Signed)
-----   Message from Eustace Pen, LPN sent at 9/52/8413  3:26 PM EDT ----- Regarding: 3/13 - labs Lab orders needed. Thank you.

## 2018-09-24 ENCOUNTER — Emergency Department (HOSPITAL_COMMUNITY): Payer: Medicare Other

## 2018-09-24 ENCOUNTER — Other Ambulatory Visit: Payer: Self-pay

## 2018-09-24 ENCOUNTER — Emergency Department (HOSPITAL_COMMUNITY)
Admission: EM | Admit: 2018-09-24 | Discharge: 2018-09-24 | Disposition: A | Discharge status: Home / Self Care | Payer: Medicare Other | Attending: Emergency Medicine | Admitting: Emergency Medicine

## 2018-09-24 ENCOUNTER — Encounter (HOSPITAL_COMMUNITY): Payer: Self-pay

## 2018-09-24 DIAGNOSIS — M549 Dorsalgia, unspecified: Secondary | ICD-10-CM

## 2018-09-24 DIAGNOSIS — E039 Hypothyroidism, unspecified: Secondary | ICD-10-CM | POA: Diagnosis not present

## 2018-09-24 DIAGNOSIS — Z79899 Other long term (current) drug therapy: Secondary | ICD-10-CM | POA: Diagnosis not present

## 2018-09-24 DIAGNOSIS — R109 Unspecified abdominal pain: Secondary | ICD-10-CM | POA: Diagnosis not present

## 2018-09-24 DIAGNOSIS — I716 Thoracoabdominal aortic aneurysm, without rupture, unspecified: Secondary | ICD-10-CM

## 2018-09-24 DIAGNOSIS — N12 Tubulo-interstitial nephritis, not specified as acute or chronic: Secondary | ICD-10-CM

## 2018-09-24 DIAGNOSIS — Z87891 Personal history of nicotine dependence: Secondary | ICD-10-CM | POA: Insufficient documentation

## 2018-09-24 DIAGNOSIS — I10 Essential (primary) hypertension: Secondary | ICD-10-CM | POA: Diagnosis not present

## 2018-09-24 DIAGNOSIS — R51 Headache: Secondary | ICD-10-CM | POA: Diagnosis present

## 2018-09-24 LAB — CBC WITH DIFFERENTIAL/PLATELET
Abs Immature Granulocytes: 0.03 10*3/uL (ref 0.00–0.07)
BASOS PCT: 1 %
Basophils Absolute: 0.1 10*3/uL (ref 0.0–0.1)
EOS ABS: 0.2 10*3/uL (ref 0.0–0.5)
EOS PCT: 2 %
HEMATOCRIT: 38.7 % (ref 36.0–46.0)
Hemoglobin: 12 g/dL (ref 12.0–15.0)
Immature Granulocytes: 0 %
LYMPHS ABS: 1.3 10*3/uL (ref 0.7–4.0)
Lymphocytes Relative: 16 %
MCH: 27.9 pg (ref 26.0–34.0)
MCHC: 31 g/dL (ref 30.0–36.0)
MCV: 90 fL (ref 80.0–100.0)
MONOS PCT: 8 %
Monocytes Absolute: 0.6 10*3/uL (ref 0.1–1.0)
NEUTROS PCT: 73 %
NRBC: 0 % (ref 0.0–0.2)
Neutro Abs: 5.9 10*3/uL (ref 1.7–7.7)
Platelets: 126 10*3/uL — ABNORMAL LOW (ref 150–400)
RBC: 4.3 MIL/uL (ref 3.87–5.11)
RDW: 16.2 % — AB (ref 11.5–15.5)
WBC: 8.1 10*3/uL (ref 4.0–10.5)

## 2018-09-24 LAB — LIPASE, BLOOD: Lipase: 48 U/L (ref 11–51)

## 2018-09-24 LAB — URINALYSIS, ROUTINE W REFLEX MICROSCOPIC
BILIRUBIN URINE: NEGATIVE
GLUCOSE, UA: NEGATIVE mg/dL
Ketones, ur: NEGATIVE mg/dL
NITRITE: POSITIVE — AB
PH: 5 (ref 5.0–8.0)
Protein, ur: 100 mg/dL — AB
Specific Gravity, Urine: 1.017 (ref 1.005–1.030)

## 2018-09-24 LAB — COMPREHENSIVE METABOLIC PANEL
ALBUMIN: 3.6 g/dL (ref 3.5–5.0)
ALK PHOS: 60 U/L (ref 38–126)
ALT: 14 U/L (ref 0–44)
ANION GAP: 14 (ref 5–15)
AST: 17 U/L (ref 15–41)
BILIRUBIN TOTAL: 0.8 mg/dL (ref 0.3–1.2)
BUN: 31 mg/dL — ABNORMAL HIGH (ref 8–23)
CALCIUM: 9 mg/dL (ref 8.9–10.3)
CO2: 20 mmol/L — ABNORMAL LOW (ref 22–32)
Chloride: 101 mmol/L (ref 98–111)
Creatinine, Ser: 1.52 mg/dL — ABNORMAL HIGH (ref 0.44–1.00)
GFR calc non Af Amer: 30 mL/min — ABNORMAL LOW (ref >60)
GFR, EST AFRICAN AMERICAN: 35 mL/min — AB (ref >60)
Glucose, Bld: 108 mg/dL — ABNORMAL HIGH (ref 70–99)
POTASSIUM: 4 mmol/L (ref 3.5–5.1)
SODIUM: 135 mmol/L (ref 135–145)
TOTAL PROTEIN: 6.3 g/dL — AB (ref 6.5–8.1)

## 2018-09-24 LAB — CK: Total CK: 133 U/L (ref 38–234)

## 2018-09-24 MED ORDER — SODIUM CHLORIDE 0.9 % IV BOLUS
500.0000 mL | Freq: Once | INTRAVENOUS | Status: AC
  Administered 2018-09-24: 500 mL via INTRAVENOUS

## 2018-09-24 MED ORDER — HYDROCODONE-ACETAMINOPHEN 5-325 MG PO TABS: 1.0000 | ORAL_TABLET | Freq: Four times a day (QID) | ORAL | 0 refills | Status: DC | PRN

## 2018-09-24 MED ORDER — CEPHALEXIN 500 MG PO CAPS: 500.0000 mg | ORAL_CAPSULE | Freq: Three times a day (TID) | ORAL | 0 refills | Status: DC

## 2018-09-24 MED ORDER — IOPAMIDOL (ISOVUE-370) INJECTION 76%
INTRAVENOUS | Status: AC
  Filled 2018-09-24: qty 50

## 2018-09-24 MED ORDER — SODIUM CHLORIDE 0.9 % IV SOLN
1.0000 g | Freq: Once | INTRAVENOUS | Status: AC
  Administered 2018-09-24: 1 g via INTRAVENOUS
  Filled 2018-09-24: qty 10

## 2018-09-24 MED ORDER — FENTANYL CITRATE (PF) 100 MCG/2ML IJ SOLN
50.0000 ug | Freq: Once | INTRAMUSCULAR | Status: AC
  Administered 2018-09-24: 50 ug via INTRAVENOUS
  Filled 2018-09-24: qty 2

## 2018-09-24 NOTE — ED Triage Notes (Signed)
Pt from home w/ a c/o lower back pain and bilateral leg pain. The back pain radiates into her abd at the site of her hernia. She had a fall several months ago where she landed on her back. This morning the leg pain began. Additional complaints of an intermittent headache.

## 2018-09-24 NOTE — ED Provider Notes (Signed)
[facility] Provider Note   CSN: [id number] Arrival date & time: 09/24/18  1921    History   Chief Complaint Chief Complaint  Patient presents with   Back Pain    HPI ARRYANNA HOLQUIN is a 85 y.o. female hx of AAA s/p repair, CAD status post stent, for hernia here presenting with back pain, headache, abdominal pain.  Patient states that she fell about a month ago and did hit her head and her arm and her back.  She states that she did not come to the ER or get any imaging studies for that.  Has been having intermittent headaches and back pain.  She has been taking her Tylenol and has been controlling it.  This morning, she states that her back pain got worse and her legs cramped up and she had a hard time getting out of bed.  She denies any additional falls since the fall about a month ago.  She also has some worsening headaches but no vomiting or weakness or trouble speaking or trouble breathing.  In addition, patient does have a ventral hernia that is chronic but patient states that she it is more distended but denies any nausea or vomiting.  Patient is still passing gas and having normal bowel movements.      The history is provided by the patient.    Past Medical History:  Diagnosis Date   AAA (abdominal aortic aneurysm) ([place])    Recurrent; s/p repair, now 3.8 cam; h/o ischemic bowel October 15, 2004   Aortic stenosis    Moderate; Echo 12/09. moderate AS mean 19. AVA 0.88   Basal cell carcinoma of nose 1980s   Bruises easily    CAD (coronary artery disease)    s/p NSTEMI 1/07 with BMS to OM; cath 12/09 due to positive Myoview, LM ok. LAD 40%. LCX 80-90% mid. RCA chronically occluded with R --> L collats   Carotid artery stenosis    R 60-79% L 0-39% (6/10) - stable   Cataract    CRI (chronic renal insufficiency)    Mild, Cr 1.3   Depression 2004-10-15   With death of spouse   Full dentures    GERD (gastroesophageal reflux disease)    HTN  (hypertension)    Hyperlipidemia    Increased LFTs on Vytorin. Changed to Lipitor.   Hyperparathyroidism    Hypothyroidism    Incontinence    Myocardial infarction [facility]) 2005-10-15   PAD (peripheral artery disease) ([facility])    s/p iliac stents   Renal artery stenosis ([facility])    Atrophic L kidney. Moderate blockage on R   Ventral hernia    Moderate sized   Vitamin D deficiency     Patient Active Problem List   Diagnosis Date Noted   Osteoporosis 11/15/2017   GAD (generalized anxiety disorder) 05/24/2017   Major depressive disorder, recurrent episode, moderate ([place]) 05/25/2016   Counseling regarding end of life decision making 12/13/2014   Descending thoracic aortic aneurysm ([place]) 05/30/2014   Vitamin D deficiency 12/26/2012   Uterine prolapse 06/23/2012   Gout 06/06/2012   INSOMNIA, CHRONIC, MILD 07/10/2010   Carotid stenosis 11/01/2008   Aortic valve stenosis 09/25/2008   Abdominal aortic aneurysm ([place]) 09/24/2008   LOW BACK PAIN, MILD 07/24/2008   Coronary atherosclerosis 06/21/2008   VENTRAL HERNIA 05/16/2008   PULMONARY NODULE, RIGHT MIDDLE LOBE 02/01/2008   CYSTOCELE WITH INCOMPLETE UTERINE PROLAPSE 02/20/2007   Hypothyroidism 02/17/2007   Hyperlipidemia 02/17/2007   Essential hypertension 02/17/2007  PAD (peripheral artery disease) ([place]) 02/17/2007   GERD 02/17/2007   CKD (chronic kidney disease) stage 3, GFR 30-59 ml/min ([place]) 02/17/2007   ISCHEMIC COLITIS 05/19/2005    Past Surgical History:  Procedure Laterality Date   ABDOMINAL AORTIC ANEURYSM REPAIR  1995, 07/01/2005   ANGIOPLASTY  2005 - 07/01/2005   APPENDECTOMY  1954   breast biospy  1996   BREAST SURGERY     CATARACT EXTRACTION, BILATERAL     CHOLECYSTECTOMY  07/01/2005   CHOLECYSTECTOMY  1987   COLONOSCOPY  07/07/2005   Colonic erosions, ulcers proctitis   COLONOSCOPY W/ ENDOSCOPIC [place]  07/01/2005   [place] kidney diffuse atrophy   CORONARY STENT PLACEMENT      CORONARY STENT PLACEMENT  07/2005   MI    HERNIA REPAIR  1997   HOSP  04/2005   ? abd. prob. mesenteric ischemia   HOSP  01/2006   Hosp for reflux (chest pain)   ILIAC ARTERY STENT  2005 - 07/01/2005   Stent placement, both legs - PVD in arteries   NSTEMI  08/13/2005   1/07 s/p stent in circumflex   TONSILLECTOMY  1943   UPPER GASTROINTESTINAL ENDOSCOPY  07/07/2005   Nml     OB History   No obstetric history on file.      Home Medications    Prior to Admission medications   Medication Sig Start Date End Date Taking? Authorizing Provider  acetaminophen (TYLENOL) 500 MG tablet Take 1,000 mg by mouth 2 (two) times daily as needed for pain.    [provider]  allopurinol (ZYLOPRIM) 100 MG tablet Take 0.5 tablets (50 mg total) by mouth daily. 04/14/18   Bedsole, Amy E, MD  aspirin EC 81 MG tablet Take 1 tablet (81 mg total) by mouth daily. 09/27/17   Minna Merritts, MD  atorvastatin (LIPITOR) 80 MG tablet TAKE 1 TABLET DAILY 11/23/17   Bedsole, Amy E, MD  Cholecalciferol 1000 UNITS capsule Take 3,000 Units by mouth daily.     [provider]  clopidogrel (PLAVIX) 75 MG tablet TAKE 1 TABLET DAILY 11/23/17   Bedsole, Amy E, MD  colestipol (COLESTID) 1 g tablet Take 2 tablets (2 g total) by mouth 2 (two) times daily. 02/14/18   Bedsole, Amy E, MD  estradiol (ESTRACE VAGINAL) 0.1 MG/GM vaginal cream Place 2 g vaginally 2 (two) times a week. Thursday and Sunday    [provider]  isosorbide mononitrate (IMDUR) 30 MG 24 hr tablet TAKE 1 TABLET TWICE A DAY 08/07/18   Minna Merritts, MD  levothyroxine (SYNTHROID, LEVOTHROID) 112 MCG tablet TAKE 1 TABLET DAILY 09/21/18   Bedsole, Amy E, MD  metoprolol tartrate (LOPRESSOR) 25 MG tablet TAKE ONE-HALF (1/2) TABLET TWICE A DAY 03/06/18   Minna Merritts, MD  sertraline (ZOLOFT) 50 MG tablet TAKE 1 TABLET BY MOUTH EVERY DAY 07/04/18   Venia Carbon, MD    Family History Family History  Problem Relation  Age of Onset   Heart attack Mother        MI   Cancer Mother    Heart disease Mother        heart attack   Stroke Mother    Cancer Father        throat   Heart attack Father        MI   Throat cancer Father    Heart disease Father        heart attack    Stroke  Father    Cancer Sister        breast   Breast cancer Sister    Hyperlipidemia Sister    Hyperlipidemia Sister    Hypertension Sister    Hyperlipidemia Son    Colon cancer Neg Hx     Social History Social History   Tobacco Use   Smoking status: Former Smoker    Last attempt to quit: 07/12/2005    Years since quitting: 13.2   Smokeless tobacco: Never Used   Tobacco comment: quit in 1997  Substance Use Topics   Alcohol use: No   Drug use: No     Allergies   Naproxen; Tramadol; Codeine; Morphine sulfate; and Sulfonamide derivatives   Review of Systems Review of Systems  Musculoskeletal: Positive for back pain.  All other systems reviewed and are negative.    Physical Exam Updated Vital Signs BP (!) 155/86    Pulse 64    Temp 97.6 F (36.4 C) (Oral)    Resp 16    Ht 5\' 6"  (1.676 m)    Wt 81.6 kg    SpO2 98%    BMI 29.05 kg/m   Physical Exam Vitals signs and nursing note reviewed.  Constitutional:      Comments: Chronically ill   HENT:     Head: Normocephalic.     Comments: Mild posterior scalp tenderness, no obvious laceration or hematoma     Right Ear: Tympanic membrane normal.     Left Ear: Tympanic membrane normal.     Nose: Nose normal.     Mouth/Throat:     Mouth: Mucous membranes are moist.  Eyes:     Extraocular Movements: Extraocular movements intact.     Pupils: Pupils are equal, round, and reactive to light.  Neck:     Musculoskeletal: Normal range of motion.  Cardiovascular:     Rate and Rhythm: Normal rate and regular rhythm.     Pulses: Normal pulses.     Heart sounds: Normal heart sounds.  Pulmonary:     Effort: Pulmonary effort is normal.     Breath  sounds: Normal breath sounds.  Abdominal:     General: Abdomen is flat.     Comments: + ventral hernia, mildly distended and tender, no rebound   Musculoskeletal:     Comments: Mild lower lumbar tenderness, no obvious deformity. No saddle anesthesia. No calf tenderness   Skin:    General: Skin is warm.     Capillary Refill: Capillary refill takes less than 2 seconds.  Neurological:     General: No focal deficit present.     Mental Status: She is alert and oriented to person, place, and time.  Psychiatric:        Mood and Affect: Mood normal.        Behavior: Behavior normal.      ED Treatments / Results  Labs (all labs ordered are listed, but only abnormal results are displayed) Labs Reviewed  CBC WITH DIFFERENTIAL/PLATELET - Abnormal; Notable for the following components:      Result Value   RDW 16.2 (*)    Platelets 126 (*)    All other components within normal limits  COMPREHENSIVE METABOLIC PANEL - Abnormal; Notable for the following components:   CO2 20 (*)    Glucose, Bld 108 (*)    BUN 31 (*)    Creatinine, Ser 1.52 (*)    Total Protein 6.3 (*)    GFR calc non Af Amer 30 (*)  GFR calc Af Amer 35 (*)    All other components within normal limits  URINALYSIS, ROUTINE W REFLEX MICROSCOPIC - Abnormal; Notable for the following components:   APPearance HAZY (*)    Hgb urine dipstick LARGE (*)    Protein, ur 100 (*)    Nitrite POSITIVE (*)    Leukocytes,Ua SMALL (*)    Bacteria, UA MANY (*)    All other components within normal limits  URINE CULTURE  LIPASE, BLOOD  CK    EKG EKG Interpretation  Date/Time:  Sunday September 24 2018 19:55:14 EDT Ventricular Rate:  55 PR Interval:    QRS Duration: 86 QT Interval:  470 QTC Calculation: 450 R Axis:   24 Text Interpretation:  Sinus rhythm Borderline short PR interval No significant change since last tracing Confirmed by Wandra Arthurs [phone]) on 09/24/2018 8:07:00 PM   Radiology Ct Abdomen Pelvis Wo  Contrast  Result Date: 09/24/2018 CLINICAL DATA:  Pt states she fell months ago and is now having low back pain radiating to hernia site. Hx ventral hernia, renal artery stenosis, GERD, chronic renal insufficiency, aortic stenosis, AAA, and iliac artery stent, hernia repair, cholecystectomy, appendectomy, and abdominal aortic aneurysm repair. EXAM: CT ABDOMEN AND PELVIS WITHOUT CONTRAST TECHNIQUE: Multidetector CT imaging of the abdomen and pelvis was performed following the standard protocol without IV contrast. COMPARISON:  CT, 01/03/2009 FINDINGS: Lower chest: No acute abnormality. Hepatobiliary: No focal liver abnormality is seen. Status post cholecystectomy. Mild chronic dilation of the common bile duct 12 mm. Pancreas: Unremarkable. No pancreatic ductal dilatation or surrounding inflammatory changes. Spleen: Normal in size without focal abnormality. Adrenals/Urinary Tract: No adrenal masses. Marked atrophy of the left kidney. Bilateral homogeneous low-attenuation renal masses, largest on the right, upper pole, 4.6 cm, enlarged on the left, 2.8 cm, posterolateral midpole, all consistent with cysts, increased in size prior CT. No renal stones. No hydronephrosis. Normal ureters. Normal bladder. Stomach/Bowel: Stomach is unremarkable. Small bowel and portions of the transverse colon enters a wide-based ventral hernia. No evidence of obstruction, incarceration or strangulation. Small bowel and colon normal caliber. No wall thickening. No adjacent inflammation. Vascular/Lymphatic: Diffuse aortic ectasia. The descending thoracic aorta measures 5.2 x 5.0 cm, 4.4 x 4.4 cm on the prior CT. Immediate infrarenal abdominal aorta measures 3.9 cm, 3.3 cm previously. Mid infrarenal abdominal aorta measures 3.7 cm, 2.8 cm previously. Left common iliac artery is dilated 24.8 cm, previously 2.7 cm. No evidence of aneurysm rupture. No enlarged lymph nodes. Reproductive: Uterus and bilateral adnexa are unremarkable. Other: No  ascites. Musculoskeletal: No fracture or acute finding. No osteoblastic or osteolytic lesions. IMPRESSION: 1. No acute findings. 2. Diffuse aortic ectasia from the lower thoracic aorta through the bifurcation. Aorta is is more dilated than it was on the prior CT as detailed above. 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 No evidence of aneurysm rupture. Status of the aortic aneurysm repair is not well evaluated on this unenhanced exam. 3. Large ventral hernia containing small bowel and a portion of transverse colon, increased in size prior CT. No evidence of obstruction, incarceration or strangulation. 4. Marked atrophy of the left kidney similar to the prior CT. Presumed bilateral renal cysts have increased in size. Electronically Signed   By: Lajean Manes M.D.   On: 09/24/2018 21:34   Ct Head Wo Contrast  Result Date: 09/24/2018 CLINICAL DATA:  [place] months ago. Now with pain. Intracranial venous injury suspected. EXAM: CT  HEAD WITHOUT CONTRAST CT CERVICAL SPINE WITHOUT CONTRAST TECHNIQUE: Multidetector CT imaging of the head and cervical spine was performed following the standard protocol without intravenous contrast. Multiplanar CT image reconstructions of the cervical spine were also generated. COMPARISON:  04/02/2018. FINDINGS: CT HEAD FINDINGS Brain: No evidence of acute infarction, hemorrhage, hydrocephalus, extra-axial collection or mass lesion/mass effect. There is ventricular and sulcal enlargement reflecting mild diffuse atrophy, stable. Old stable lacunar infarct noted in the right caudate nucleus head. Mild periventricular white matter hypoattenuation is also present consistent with chronic microvascular ischemic change. Vascular: No hyperdense vessel or unexpected calcification. Skull: Normal. Negative for fracture or focal lesion. Sinuses/Orbits: Globes and orbits are unremarkable. The visualized sinuses and mastoid air cells are clear. Other:  None. CT CERVICAL SPINE FINDINGS Alignment: Mild kyphosis, apex at C4.  No spondylolisthesis. Skull base and vertebrae: No acute fracture. No primary bone lesion or focal pathologic process. Soft tissues and spinal canal: No prevertebral fluid or swelling. No visible canal hematoma. Disc levels: Mild loss of disc height at C4-C5 and C5-C6. Mild spondylotic disc bulging and endplate spurring at J1-O8 and C5-C6. There are more significant facet degenerative changes, greatest on the right at C2-C3 and on the left at C3-C4. No evidence of a disc herniation. Upper chest: No acute findings. No masses. Mild apical lung scarring. Other: None. IMPRESSION: HEAD CT 1. No acute intracranial abnormalities. 2. Mild atrophy and chronic microvascular ischemic change. 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 CERVICAL CT 1. No fracture or acute finding. 2. Stable degenerative changes. Electronically Signed   By: Lajean Manes M.D.   On: 09/24/2018 21:01   Ct Cervical Spine Wo Contrast  Result Date: 09/24/2018 CLINICAL DATA:  [place] months ago. Now with pain. Intracranial venous injury suspected. EXAM: CT HEAD WITHOUT CONTRAST CT CERVICAL SPINE WITHOUT CONTRAST TECHNIQUE: Multidetector CT imaging of the head and cervical spine was performed following the standard protocol without intravenous contrast. Multiplanar CT image reconstructions of the cervical spine were also generated. COMPARISON:  04/02/2018. FINDINGS: CT HEAD FINDINGS Brain: No evidence of acute infarction, hemorrhage, hydrocephalus, extra-axial collection or mass lesion/mass effect. There is ventricular and sulcal enlargement reflecting mild diffuse atrophy, stable. Old stable lacunar infarct noted in the right caudate nucleus head. Mild periventricular white matter hypoattenuation is also present consistent with chronic microvascular ischemic change. Vascular: No hyperdense vessel or unexpected calcification. Skull: Normal. Negative for fracture or focal  lesion. Sinuses/Orbits: Globes and orbits are unremarkable. The visualized sinuses and mastoid air cells are clear. Other: None. CT CERVICAL SPINE FINDINGS Alignment: Mild kyphosis, apex at C4.  No spondylolisthesis. Skull base and vertebrae: No acute fracture. No primary bone lesion or focal pathologic process. Soft tissues and spinal canal: No prevertebral fluid or swelling. No visible canal hematoma. Disc levels: Mild loss of disc height at C4-C5 and C5-C6. Mild spondylotic disc bulging and endplate spurring at C1-Y6 and C5-C6. There are more significant facet degenerative changes, greatest on the right at C2-C3 and on the left at C3-C4. No evidence of a disc herniation. Upper chest: No acute findings. No masses. Mild apical lung scarring. Other: None. IMPRESSION: HEAD CT 1. No acute intracranial abnormalities. 2. Mild atrophy and chronic microvascular ischemic change. 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 CERVICAL CT 1. No fracture or acute finding. 2. Stable degenerative changes. Electronically Signed   By: Lajean Manes M.D.   On: 09/24/2018 21:01   Ct L-spine No Charge  Result Date: 09/24/2018 CLINICAL DATA:  85 year old female with  low back pain after fall months ago. EXAM: CT LUMBAR SPINE WITHOUT CONTRAST TECHNIQUE: Technique: Multiplanar CT images of the lumbar spine were reconstructed from contemporary CT of the Abdomen and Pelvis. CONTRAST:  None COMPARISON:  CT Abdomen, and Pelvis today reported separately. Chest CT 09/16/2017. FINDINGS: Segmentation: Normal. Alignment: Mild straightening of lumbar lordosis. Mild degenerative appearing retrolisthesis of L5 on S1. Vertebrae: Osteopenia. No acute osseous abnormality identified. Visible sacrum and SI joints appear intact. Paraspinal and other soft tissues: Severe thoracoabdominal aneurysm. 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 See CT  Abdomen and Pelvis today reported separately. Paraspinal soft tissues remain within  normal limits. Disc levels: T12-L1:  Negative. L1-L2:  Negative. L2-L3: Left eccentric circumferential disc bulge and endplate spurring. Mild left lateral recess stenosis suspected (left L3 nerve level). L3-L4: Circumferential disc bulge eccentric to the left. Mild posterior element hypertrophy. Up to mild spinal and moderate left lateral recess stenosis suspected (left L4 nerve level). L4-L5: Circumferential disc bulge. Vacuum disc. Moderate posterior element hypertrophy. Mild to moderate spinal stenosis suspected. L5-S1: Disc space loss and vacuum disc. Circumferential disc osteophyte complex. Mild posterior element hypertrophy. No definite spinal or lateral recess stenosis. Moderate bilateral L5 foraminal stenosis. IMPRESSION: 1. Severe Thoracoabdominal Aneurysm. See CT Abdomen and Pelvis today reported separately. 2. Osteopenia. No acute osseous abnormality identified in the lumbar spine. 3. Mild for age lumbar spine degeneration, probable mild or moderate degenerative spinal stenosis at L3-L4 and L4-L5. Moderate bilateral neural foraminal stenosis at L5-S1. Electronically Signed   By: Genevie Ann M.D.   On: 09/24/2018 21:36    Procedures Procedures (including critical care time)  Medications Ordered in ED Medications  iopamidol (ISOVUE-370) 76 % injection (has no administration in time range)  fentaNYL (SUBLIMAZE) injection 50 mcg (50 mcg Intravenous Given 09/24/18 1954)  cefTRIAXone (ROCEPHIN) 1 g in sodium chloride 0.9 % 100 mL IVPB (1 g Intravenous New Bag/Given 09/24/18 2154)  sodium chloride 0.9 % bolus 500 mL (500 mLs Intravenous New Bag/Given 09/24/18 2154)     Initial Impression / Assessment and Plan / ED Course  I have reviewed the triage vital signs and the nursing notes.  Pertinent labs & imaging results that were available during my care of the patient were reviewed by me and considered in my medical decision making (see chart for details).       GENELLA BAS is a 85 y.o. female  here with back pain, headache, abdominal pain. Patient did have a fall a month ago but didn't get checked out. She has worsening headaches, back pain. Will get CT head/neck, CT ab/pel, CT lumbar spine, labs, UA.    10:30 PM Patient's Cr is 1.5. CT head unremarkable. CT ab/pel showed enlarged thoracic and aortic aneurysm with no obvious rupture. Unfortunately due to her elevated Cr, she can't get contrast.  I talked the patient and daughter extensively about getting contrast CTA to look at the thoracic and aortic aneurysm.  Per the daughter, patient only has one kidney and she does not want to potentially cause renal failure.  I have low suspicion for dissection currently.  Pain is well controlled.  Her urinalysis showed a UTI.  Will treat for pyelonephritis currently and will have her follow-up with her vascular surgeon outpatient.   Final Clinical Impressions(s) / ED Diagnoses   Final diagnoses:  Back pain    ED Discharge Orders    None       Drenda Freeze, MD 09/24/18 2232

## 2018-09-24 NOTE — Discharge Instructions (Signed)
Take keflex three times daily for a week.   Take tylenol for pain.   Take vicodin for severe pain.   Your aneurysm is more enlarged than previous. Please see your surgeon for further evaluation   See your primary care doctor  Return to ER if you have worse abdominal pain, back pain, chest pain, trouble breathing, headaches, weakness, numbness

## 2018-09-26 ENCOUNTER — Ambulatory Visit (INDEPENDENT_AMBULATORY_CARE_PROVIDER_SITE_OTHER): Payer: Medicare Other | Admitting: Family Medicine

## 2018-09-26 ENCOUNTER — Other Ambulatory Visit: Payer: Self-pay

## 2018-09-26 ENCOUNTER — Encounter: Payer: Self-pay | Admitting: Family Medicine

## 2018-09-26 VITALS — BP 100/70 | HR 70 | Temp 97.9°F | Ht 65.5 in | Wt 174.8 lb

## 2018-09-26 DIAGNOSIS — N12 Tubulo-interstitial nephritis, not specified as acute or chronic: Secondary | ICD-10-CM | POA: Diagnosis not present

## 2018-09-26 DIAGNOSIS — I714 Abdominal aortic aneurysm, without rupture, unspecified: Secondary | ICD-10-CM

## 2018-09-26 DIAGNOSIS — N183 Chronic kidney disease, stage 3 unspecified: Secondary | ICD-10-CM

## 2018-09-26 LAB — URINE CULTURE: Culture: >=100000 — AB

## 2018-09-26 MED ORDER — ATORVASTATIN CALCIUM 80 MG PO TABS: 80.0000 mg | ORAL_TABLET | Freq: Every day | ORAL | 3 refills | Status: DC

## 2018-09-26 MED ORDER — CLOPIDOGREL BISULFATE 75 MG PO TABS: 75.0000 mg | ORAL_TABLET | Freq: Every day | ORAL | 3 refills | Status: DC

## 2018-09-26 MED ORDER — LEVOTHYROXINE SODIUM 112 MCG PO TABS: 112.0000 ug | ORAL_TABLET | Freq: Every day | ORAL | 3 refills | Status: DC

## 2018-09-26 MED ORDER — ALLOPURINOL 100 MG PO TABS: 50.0000 mg | ORAL_TABLET | Freq: Every day | ORAL | 3 refills | Status: DC

## 2018-09-26 MED ORDER — SERTRALINE HCL 50 MG PO TABS: 50.0000 mg | ORAL_TABLET | Freq: Every day | ORAL | 3 refills | Status: DC

## 2018-09-26 NOTE — Patient Instructions (Addendum)
Keep up with fluids. Complete antibiotics. Contact vascular MD about changes in  abdominal aneurysm.

## 2018-09-26 NOTE — Assessment & Plan Note (Signed)
Improved general symptoms, still with intermittent back pain but it sounds more [facility] at this time.  Complelte course of antibiotics given Ecoli in urine.

## 2018-09-26 NOTE — Progress Notes (Addendum)
Subjective:    Patient ID: [name], female    DOB: June 23, 1928, 85 y.o.   MRN: [id number]  HPI   85 year old female  With history of AAA s/p repair,  CAD s/p stent presented for ER follow up..  Seen on 3/15 for back pain, headache and abdominal pain.. Dx with pyelonephritis. No dysuria, no change in frequency, no fever  Also fell 1 month ago.. hit her head but had no prior evaluation.  Cr 1.52 GFR 30  urine culture: Ecoli CT ab/pel showed enlarged thoracic and aortic aneurysm with no obvious rupture.  CT head unremarkable   Given rocephin 1 g  Treated at home with: cephalexin TID x 7 days   She feels better overall but still with  intermittant mid back pain. NO abdominal pain. More energy , sleeping better not feeling as ill since starting antibitoics.  Worse when updoing things, staying on feet.  Drinking lots of water.  No fever at home. NO SE to antibiotics.   Has AAA.Burley Saver showed slight increase in size: IMPRESSION: 1. No acute findings. 2. Diffuse aortic ectasia from the lower thoracic aorta through the bifurcation. Aorta is is more dilated than it was on the prior CT as detailed above. 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 No evidence of aneurysm rupture. Status of the aortic aneurysm repair is not well evaluated on this unenhanced exam. 3. Large ventral hernia containing small bowel and a portion of transverse colon, increased in size prior CT. No evidence of obstruction, incarceration or strangulation. 4. Marked atrophy of the left kidney similar to the prior CT. Presumed bilateral renal cysts have increased in size.  The descending thoracic aorta measures 5.2 x 5.0 cm, 4.4 x 4.4 cm on the prior CT. Immediate infrarenal abdominal aorta measures 3.9 cm, 3.3 cm previously. Mid infrarenal abdominal aorta measures 3.7 cm, 2.8 cm previously. Left common iliac artery is dilated 24.8 cm, previously 2.7  cm. No evidence of aneurysm rupture.   Daughter has  A call into Dr. Oneida Alar.  Dr. Barbee Cough..  [facility]  Dr. Oneida Alar Vascular Dr. Zara Chess   Social History [facility] History/Past Medical History reviewed in detail and updated in EMR if needed. Blood pressure 100/70, pulse 70, temperature 97.9 F (36.6 C), temperature source Oral, height 5' 5.5" (1.664 m), weight 174 lb 12 oz (79.3 kg), SpO2 96 %.   Review of Systems  Constitutional: Negative for fatigue and fever.  HENT: Negative for congestion.   Eyes: Negative for pain.  Respiratory: Negative for cough and shortness of breath.   Cardiovascular: Negative for chest pain, palpitations and leg swelling.  Gastrointestinal: Negative for abdominal pain.  Genitourinary: Negative for dysuria and vaginal bleeding.  Musculoskeletal: Negative for back pain.  Neurological: Negative for syncope, light-headedness and headaches.  Psychiatric/Behavioral: Negative for dysphoric mood.       Objective:   Physical Exam Constitutional:      General: She is not in acute distress.    Appearance: Normal appearance. She is well-developed. She is not ill-appearing or toxic-appearing.     Comments: elderly  HENT:     Head: Normocephalic.     Right Ear: Hearing, tympanic membrane, ear canal and external ear normal. Tympanic membrane is not erythematous, retracted or bulging.     Left Ear: Hearing, tympanic membrane, ear canal and external ear normal. Tympanic membrane is not erythematous, retracted or bulging.     Nose:  No mucosal edema or rhinorrhea.     Right Sinus: No maxillary sinus tenderness or frontal sinus tenderness.     Left Sinus: No maxillary sinus tenderness or frontal sinus tenderness.     Mouth/Throat:     Pharynx: Uvula midline.  Eyes:     General: Lids are normal. Lids are everted, no foreign bodies appreciated.     Conjunctiva/sclera: Conjunctivae normal.     Pupils: Pupils are equal, round, and reactive to light.  Neck:      Musculoskeletal: Normal range of motion and neck supple.     Thyroid: No thyroid mass or thyromegaly.     Vascular: No carotid bruit.     Trachea: Trachea normal.  Cardiovascular:     Rate and Rhythm: Normal rate and regular rhythm.     Pulses: Normal pulses.     Heart sounds: S1 normal and S2 normal. Murmur present. Systolic murmur present with a grade of 2/6. No friction rub. No gallop.   Pulmonary:     Effort: Pulmonary effort is normal. No tachypnea or respiratory distress.     Breath sounds: Normal breath sounds. No decreased breath sounds, wheezing, rhonchi or rales.  Abdominal:     General: Bowel sounds are normal.     Palpations: Abdomen is soft.     Tenderness: There is no abdominal tenderness.  Musculoskeletal:     Cervical back: She exhibits decreased range of motion. She exhibits no tenderness and no bony tenderness.     Thoracic back: She exhibits normal range of motion, no tenderness and no bony tenderness.     Lumbar back: She exhibits decreased range of motion. She exhibits no tenderness and no bony tenderness.     Comments: No current back pain, no CVA tenderness  Skin:    General: Skin is warm and dry.     Findings: No rash.  Neurological:     Mental Status: She is alert.  Psychiatric:        Mood and Affect: Mood is not anxious or depressed.        Speech: Speech normal.        Behavior: Behavior normal. Behavior is cooperative.        Thought Content: Thought content normal.        Judgment: Judgment normal.           Assessment & Plan:

## 2018-09-26 NOTE — Assessment & Plan Note (Signed)
Slight decrease in GFR in last year.Marland Kitchen keep follow up with Dr. Zara Chess as planned.

## 2018-09-26 NOTE — Addendum Note (Signed)
Addended by: Carter Kitten on: 09/26/2018 02:27 PM   Modules accepted: Orders

## 2018-09-26 NOTE — Assessment & Plan Note (Signed)
Increase in AAA noted on non-contrast Ct abd pelvis since last check in 2010... daughter will contact Dr. Nona Dell to determine if change in plan needed as now > 5 cm.  Pt is still a poor surgical candidate.

## 2018-09-27 ENCOUNTER — Telehealth: Payer: Self-pay

## 2018-09-27 NOTE — Telephone Encounter (Signed)
Post ED Visit - Positive Culture Follow-up  Culture report reviewed by antimicrobial stewardship pharmacist: [place] Team []  Elenor Quinones, Pharm.D. []  Heide Guile, Pharm.D., BCPS AQ-ID []  Parks Neptune, Pharm.D., BCPS []  Alycia Rossetti, Pharm.D., BCPS []  [place], Pharm.D., BCPS, AAHIVP []  Legrand Como, Pharm.D., BCPS, AAHIVP [x]  Salome Arnt, PharmD, BCPS []  Johnnette Gourd, PharmD, BCPS []  Hughes Better, PharmD, BCPS []  Leeroy Cha, PharmD []  Laqueta Linden, PharmD, BCPS []  Albertina Parr, PharmD  [place] Team []  Leodis Sias, PharmD []  Lindell Spar, PharmD []  Royetta Asal, PharmD []  Graylin Shiver, Rph []  Rema Fendt) Glennon Mac, PharmD []  Arlyn Dunning, PharmD []  Netta Cedars, PharmD []  Dia Sitter, PharmD []  Leone Haven, PharmD []  Gretta Arab, PharmD []  Theodis Shove, PharmD []  Peggyann Juba, PharmD []  Reuel Boom, PharmD   Positive urine culture Treated with Cephalexin, organism sensitive to the same and no further patient follow-up is required at this time.  Genia Del 09/27/2018, 8:20 AM

## 2018-09-28 ENCOUNTER — Other Ambulatory Visit: Payer: Self-pay | Admitting: Family Medicine

## 2018-10-03 ENCOUNTER — Encounter: Payer: Medicare Other | Admitting: Family Medicine

## 2018-10-05 ENCOUNTER — Telehealth: Payer: Medicare Other | Admitting: Vascular Surgery

## 2018-10-10 ENCOUNTER — Ambulatory Visit: Payer: Medicare Other | Admitting: Family Medicine

## 2018-10-12 ENCOUNTER — Other Ambulatory Visit: Payer: Self-pay

## 2018-10-12 ENCOUNTER — Ambulatory Visit (INDEPENDENT_AMBULATORY_CARE_PROVIDER_SITE_OTHER): Payer: Medicare Other | Admitting: Vascular Surgery

## 2018-10-12 DIAGNOSIS — I716 Thoracoabdominal aortic aneurysm, without rupture: Secondary | ICD-10-CM

## 2018-10-12 DIAGNOSIS — I6521 Occlusion and stenosis of right carotid artery: Secondary | ICD-10-CM | POA: Diagnosis not present

## 2018-10-12 DIAGNOSIS — I723 Aneurysm of iliac artery: Secondary | ICD-10-CM

## 2018-10-12 NOTE — Progress Notes (Signed)
Virtual Visit via Telephone Note  I connected with [name] on 10/12/18 at  1:15 PM EDT by telephone and verified that I am speaking with the correct person using two identifiers.  Most of the interview was conducted through the patient's daughter [name].  Was done at the patient's request.    History of Present Illness: #1 patient has a known thoracoabdominal aneurysm type II.  She had a recent CT scan which showed enlargement of the left common iliac artery to 4.8 cm diameter.  The patient does have some chronic back pain.  This is not really worse.  She has no abdominal pain.  2.  Patient has known history of greater than 80% internal carotid artery stenosis.  She has less than 40% left internal carotid artery stenosis.  Most recent carotid duplex exam is May 2019.  Previously she had not been considered for carotid endarterectomy due to high location of the lesion.  She had not been deemed a candidate for carotid stenting due to renal insufficiency and worry about contrast nephropathy.  She has a nonfunctioning kidney on one side.  However, she has now begun to develop some symptoms of a aphasia at times.  The patient also will stare off into space blankly for brief periods of time and then returned to normal.  In discussions with her daughter these sound consistent with TIA type events.  She is having these fairly frequently.  They have been going on for about a month.  The patient remains on Plavix aspirin and a statin.  She has been compliant with these.    Data: I reviewed the patient's CT scan of the chest abdomen pelvis which shows stable 4 to 5 mm descending thoracic and upper abdominal aorta.  She has a 4.8 cm nonruptured left common iliac artery aneurysm.  There is a stent near the iliac bifurcation.  Assessment and Plan: #1 thoracoabdominal aneurysm now with enlargement of left common iliac artery to 4.8 cm.  I discussed with the patient and her daughter that unfortunately she  is not a candidate for endovascular or open repair of this due to the anatomical configuration and the scale of operation if it was fixed open.  Unfortunately the size of the aorta and surrounding arteries are too large for stent graft repair.  I discussed with the patient and her daughter that if this aneurysm ruptures she will most likely die from this.  In the event of rupture we will not consider repair.  Patient and her daughter are in agreement with this.  #2 severe greater than 80% right internal carotid artery stenosis now symptomatic with what sounds like TIA events.  We will schedule the patient for a carotid duplex ultrasound early next week as well as a noncontrast CT scan of the neck focused on the arterial circulation to for consideration for TCAR stenting which would require less contrast and lower risk of contrast nephropathy.  I believe the patient is high risk for significant stroke without intervention.  I do believe she would benefit from this and is fairly functional overall.  I did discuss with the daughter that if the artery is occluded at this point we would not consider further intervention.  Follow Up Instructions: The patient will be scheduled for carotid duplex ultrasound and CT angios of the neck next week without contrast   I discussed the assessment and treatment plan with the patient. The patient was provided an opportunity to ask questions and all were  answered. The patient agreed with the plan and demonstrated an understanding of the instructions.  I provided 10 minutes of non-face-to-face time during this encounter.   [name], MD

## 2018-10-13 ENCOUNTER — Other Ambulatory Visit: Payer: Self-pay

## 2018-10-13 DIAGNOSIS — I6521 Occlusion and stenosis of right carotid artery: Secondary | ICD-10-CM

## 2018-10-16 ENCOUNTER — Other Ambulatory Visit: Payer: Self-pay

## 2018-10-16 DIAGNOSIS — I6521 Occlusion and stenosis of right carotid artery: Secondary | ICD-10-CM

## 2018-10-17 ENCOUNTER — Encounter (HOSPITAL_COMMUNITY): Payer: Medicare Other

## 2018-10-18 ENCOUNTER — Ambulatory Visit
Admission: RE | Admit: 2018-10-18 | Discharge: 2018-10-18 | Disposition: A | Discharge status: Home / Self Care | Payer: Medicare Other | Source: Ambulatory Visit | Attending: Vascular Surgery | Admitting: Vascular Surgery

## 2018-10-18 ENCOUNTER — Telehealth (HOSPITAL_COMMUNITY): Payer: Self-pay | Admitting: Rehabilitation

## 2018-10-18 ENCOUNTER — Other Ambulatory Visit: Payer: Self-pay

## 2018-10-18 DIAGNOSIS — I6521 Occlusion and stenosis of right carotid artery: Secondary | ICD-10-CM

## 2018-10-18 NOTE — Telephone Encounter (Signed)
The above patient or their representative was contacted and gave the following answers to these questions:         Do you have any of the following symptoms? No  Fever                    Cough                   Shortness of breath  Do  you have any of the following other symptoms? No    muscle pain         vomiting,        diarrhea        rash         weakness        red eye        abdominal pain         bruising          bruising or bleeding              joint pain           severe headache    Have you been in contact with someone who was or has been sick in the past 2 weeks? No  Yes                 Unsure                         Unable to assess   Does the person that you were in contact with have any of the following symptoms? N/A  Cough         shortness of breath           muscle pain         vomiting,            diarrhea            rash            weakness           fever            red eye           abdominal pain           bruising  or  bleeding                joint pain                severe headache               Have you  or someone you have been in contact with traveled internationally in th last month? No        If yes, which countries? N/A   Have you  or someone you have been in contact with traveled outside [place] in th last month?  No       If yes, which state and city? N/A   COMMENTS OR ACTION PLAN FOR THIS PATIENT:          

## 2018-10-19 ENCOUNTER — Telehealth: Payer: Medicare Other | Admitting: Vascular Surgery

## 2018-10-19 ENCOUNTER — Ambulatory Visit (HOSPITAL_COMMUNITY)
Admission: RE | Admit: 2018-10-19 | Discharge: 2018-10-19 | Disposition: A | Discharge status: Home / Self Care | Payer: Medicare Other | Source: Ambulatory Visit | Attending: Vascular Surgery | Admitting: Vascular Surgery

## 2018-10-19 DIAGNOSIS — I6521 Occlusion and stenosis of right carotid artery: Secondary | ICD-10-CM | POA: Insufficient documentation

## 2018-10-24 ENCOUNTER — Telehealth: Payer: Self-pay | Admitting: Cardiovascular Disease

## 2018-10-24 NOTE — Telephone Encounter (Signed)
° °  [facility] Pre-operative Risk Assessment    Request for surgical clearance:  1. What type of surgery is being performed? TCAR   2. When is this surgery scheduled? 11/03/2018   3. What type of clearance is required (medical clearance vs. Pharmacy clearance to hold med vs. Both)? Medical   4. Are there any medications that need to be held prior to surgery and how long? None listed    5. Practice name and name of physician performing surgery? [facility], Dr [name]    6. What is your office phone number [phone], direct line to [facility] [phone]    7.   What is your office fax number [phone]  8.   Anesthesia type (None, local, MAC, general) ? None listed    Ace Gins 10/24/2018, 2:21 PM  _________________________________________________________________   (provider comments below)

## 2018-10-24 NOTE — Telephone Encounter (Signed)
   Primary Cardiologist:Timothy Rockey Situ, MD  Chart reviewed as part of pre-operative protocol coverage. Because of [name] past medical history and time since last visit, he/she will require a follow-up visit in order to better assess preoperative cardiovascular risk.  Pre-op covering staff: - Please schedule appointment and call patient to inform them. - Please contact requesting surgeon's office via preferred method (i.e, phone, fax) to inform them of need for appointment prior to surgery.  Patient may be setup for a telehealth visit with Dr. Rockey Situ in the next week, if possible.   If applicable, this message will also be routed to pharmacy pool and/or primary cardiologist for input on holding anticoagulant/antiplatelet agent as requested below so that this information is available at time of patient's appointment.   Tami Lin Solae Norling, PA  10/24/2018, 4:10 PM

## 2018-10-25 NOTE — Telephone Encounter (Signed)
Can we book [name] for evisit with me For preop surgery, thx TG

## 2018-10-26 ENCOUNTER — Other Ambulatory Visit: Payer: Self-pay | Admitting: *Deleted

## 2018-10-26 ENCOUNTER — Other Ambulatory Visit: Payer: Self-pay | Admitting: Family Medicine

## 2018-10-26 NOTE — Progress Notes (Signed)
Call to patient's daughter [name]. Instructed to be at [facility] admitting  At 5:30 am on 11/03/2018 for surgery. NPO past MN except must take Aspirin, Statin and Plavix with sip of water am of surgery before leaving for the hospital. Expect a call and follow the detailed surgery/medication instructions received from the hospital pre-admission department. Verbalized understanding and expect Televisit with Cardiologist. Reviewed visitor restrictions.

## 2018-10-26 NOTE — Telephone Encounter (Signed)
Spoke with patient.  Patient was completely unaware of any procedure she was having.  She stated she was going to call her daughter then call back.

## 2018-10-30 ENCOUNTER — Telehealth: Payer: Self-pay | Admitting: *Deleted

## 2018-10-30 ENCOUNTER — Telehealth: Payer: Self-pay

## 2018-10-30 NOTE — Telephone Encounter (Signed)
Call from patient's daughter [name]. After the patient and family spoke this weekend, patient has decided she wants to cancel surgery with Dr. Oneida Alar.

## 2018-10-30 NOTE — Telephone Encounter (Signed)
-----   Message from Willy Eddy, RN sent at 10/30/2018 10:19 AM EDT ----- Regarding: CARDIAC CLEARance I had a detailed conversation with [name]'s daughter on Thursday regarding her surgery and her need for appointment with Dr. Rockey Situ for clearance ASAP. [name] [phone]. Patient and daughter are aware that surgery is scheduled for 11/03/2018 TCAR with Dr. Oneida Alar. I had asked that [name] contact the office on Friday. Thank you for your help. Becky

## 2018-10-30 NOTE — Telephone Encounter (Signed)
Called patients daughter to set up an Evisit.  No answer. LMOV.

## 2018-10-30 NOTE — Telephone Encounter (Signed)
Appointment scheduled.  11/03/2018 @ 1:40pm

## 2018-10-30 NOTE — Telephone Encounter (Signed)
Virtual Visit Pre-Appointment Phone Call  Steps For Call:  1. Confirm consent - "In the setting of the current Covid19 crisis, you are scheduled for a VIDEO visit with your provider on 11/03/2018 at 1:40pm.  Just as we do with many in-office visits, in order for you to participate in this visit, we must obtain consent.  If you'd like, I can send this to your mychart (if signed up) or email for you to review.  Otherwise, I can obtain your verbal consent now.  All virtual visits are billed to your insurance company just like a normal visit would be.  By agreeing to a virtual visit, we'd like you to understand that the technology does not allow for your provider to perform an examination, and thus may limit your provider's ability to fully assess your condition. If your provider identifies any concerns that need to be evaluated in person, we will make arrangements to do so.  Finally, though the technology is pretty good, we cannot assure that it will always work on either your or our end, and in the setting of a video visit, we may have to convert it to a phone-only visit.  In either situation, we cannot ensure that we have a secure connection.  Are you willing to proceed?" STAFF: Did the patient verbally acknowledge consent to telehealth visit? Document YES/NO here: YES  2. Confirm the BEST phone number to call the day of the visit by including in appointment notes  3. Give patient instructions for MyChart download to smartphone OR [email or web address] as below if video visit (depending on what platform provider is using)  4. Confirm that appointment type is correct in Epic appointment notes (VIDEO vs PHONE)  5. Advise patient to be prepared with their blood pressure, heart rate, weight, any heart rhythm information, their current medicines, and a piece of paper and pen handy for any instructions they may receive the day of their visit  6. Inform patient they will receive a phone call 15 minutes prior  to their appointment time (may be from unknown caller ID) so they should be prepared to answer    TELEPHONE CALL NOTE  [name] has been deemed a candidate for a follow-up tele-health visit to limit community exposure during the Covid-19 pandemic. I spoke with the patient via phone to ensure availability of phone/video source, confirm preferred email & phone number, and discuss instructions and expectations.  I reminded [name] to be prepared with any vital sign and/or heart rhythm information that could potentially be obtained via home monitoring, at the time of her visit. I reminded [name] to expect a phone call prior to her visit.  [name], [place] 10/30/2018 4:04 PM   INSTRUCTIONS FOR DOWNLOADING THE MYCHART APP TO SMARTPHONE  - The patient must first make sure to have activated MyChart and know their login information - If Apple, go to [employer] and type in MyChart in the search bar and download the app. If Android, ask patient to go to [employer] and type in [place] in the search bar and download the app. The app is free but as with any other app downloads, their phone may require them to verify saved payment information or Apple/Android password.  - The patient will need to then log into the app with their MyChart username and password, and select [facility] as their healthcare provider to link the account. When it is time for your visit, go  to the MyChart app, find appointments, and click Begin Video Visit. Be sure to Select Allow for your device to access the Microphone and Camera for your visit. You will then be connected, and your provider will be with you shortly.  **If they have any issues connecting, or need assistance please contact MyChart service desk [phone])**  **If using a computer, in order to ensure the best quality for their visit they will need to use either of the following Internet Browsers: [employer], or  Google Chrome**  IF USING DOXIMITY or DOXY.ME - The patient will receive a link just prior to their visit by text.     FULL LENGTH CONSENT FOR TELE-HEALTH VISIT   I hereby voluntarily request, consent and authorize [facility] and its employed or contracted physicians, physician assistants, nurse practitioners or other licensed health care professionals (the Practitioner), to provide me with telemedicine health care services (the "Services") as deemed necessary by the treating Practitioner. I acknowledge and consent to receive the Services by the Practitioner via telemedicine. I understand that the telemedicine visit will involve communicating with the Practitioner through live audiovisual communication technology and the disclosure of certain medical information by electronic transmission. I acknowledge that I have been given the opportunity to request an in-person assessment or other available alternative prior to the telemedicine visit and am voluntarily participating in the telemedicine visit.  I understand that I have the right to withhold or withdraw my consent to the use of telemedicine in the course of my care at any time, without affecting my right to future care or treatment, and that the Practitioner or I may terminate the telemedicine visit at any time. I understand that I have the right to inspect all information obtained and/or recorded in the course of the telemedicine visit and may receive copies of available information for a reasonable fee.  I understand that some of the potential risks of receiving the Services via telemedicine include:  Marland Kitchen Delay or interruption in medical evaluation due to technological equipment failure or disruption; . Information transmitted may not be sufficient (e.g. poor resolution of images) to allow for appropriate medical decision making by the Practitioner; and/or  . In rare instances, security protocols could fail, causing a breach of personal health  information.  Furthermore, I acknowledge that it is my responsibility to provide information about my medical history, conditions and care that is complete and accurate to the best of my ability. I acknowledge that Practitioner's advice, recommendations, and/or decision may be based on factors not within their control, such as incomplete or inaccurate data provided by me or distortions of diagnostic images or specimens that may result from electronic transmissions. I understand that the practice of medicine is not an exact science and that Practitioner makes no warranties or guarantees regarding treatment outcomes. I acknowledge that I will receive a copy of this consent concurrently upon execution via email to the email address I last provided but may also request a printed copy by calling the office of [facility].    I understand that my insurance will be billed for this visit.   I have read or had this consent read to me. . I understand the contents of this consent, which adequately explains the benefits and risks of the Services being provided via telemedicine.  . I have been provided ample opportunity to ask questions regarding this consent and the Services and have had my questions answered to my satisfaction. . I give my informed consent  for the services to be provided through the use of telemedicine in my medical care  By participating in this telemedicine visit I agree to the above.

## 2018-10-31 NOTE — Progress Notes (Deleted)
{Choose 1 Note Type (Telehealth Visit or Telephone Visit):[phone]}   I connected with  [name] on 10/31/18 by a video enabled telemedicine application and verified that I am speaking with the correct person using two identifiers. I discussed the limitations of evaluation and management by telemedicine. The patient expressed understanding and agreed to proceed.   Evaluation Performed:  Follow-up visit  Date:  10/31/2018   ID:  [name], DOB 1928-03-22, MRN [id number]  Patient Location:  [address] [place] [address]   Provider location:   [facility] [place]  PCP:  Jinny Sanders, MD  Cardiologist:  Patsy Baltimore   Chief Complaint:      History of Present Illness:    [name] is a 85 y.o. female who presents via audio/video conferencing for a telehealth visit today.   The patient does not symptoms concerning for COVID-19 infection (fever, chills, cough, or new SHORTNESS OF BREATH).   Patient has a past medical history of Moderate  aortic valve stenosis  coronary artery disease,  status post non-ST-elevation myocardial infarction in January 2007, bare-metal stenting to the obtuse marginal, totally occluded right coronary with Collaterals residual signifcant LCX disease not easily amenable to intervention, abdominal aortic aneurysm repair and bilateral iliac stenting,  descending aorta aneurysm 4.8 cm with mural thrombus carotid artery stenosis (critical on the right) followed by Dr. Oneida Alar, 80-99%  renal artery stenosis with an atrophic left kidney and a moderate blockage on the right, hypertension  hyperlipidemia,   Anxiety more forgetful/memory issues She presents for follow-up of her coronary artery disease, aortic valve stenosis and PAD  In follow-up today she reports that she got up out of a chair in December 2018 fell over her shoes broke both her ankles No surgery was required Went to rehab for prolonged  period of time, had good recovery  Had casts on , developed wounds from sweating Now better 3 months later Ambulating without assistance, no cane or walker  Denies any significant shortness of breath or chest pain No TIA or stroke symptoms Overall feels well without complaints Denies any claudication type symptoms  Carotid ultrasound results reviewed with her showing still critical disease on the right She has follow-up with Dr. Oneida Alar  She has been taking aspirin 325 mg twice a day with Plavix  She is tolerating Lipitor 80 mg daily  EKG on today's visit showing normal sinus rhythm with rate 59 bpm, no significant ST or T-wave changes  Other past medical history She stopped smoking approximately 10 years ago, smoked for 20-30 years but the exact number of years is vague  carotid ultrasound showed progression of the stenosis on the right. Estimate is 80-99%.  She has seen Dr. Oneida Alar with repeat followup and ultrasound in 2014. Plan was made to watch her closely with possible stenting with any symptoms of TIA or stroke   Aortic ultrasound showed tortuous ectatic aorta biiliac bypass graft. Maximum dimensions 3.5 x 3.3 cm, dilated left common iliac artery 2 cm Previous echocardiogram EF 55-65% Mild to moderate AS     Prior CV studies:   The following studies were reviewed today:    Past Medical History:  Diagnosis Date  . AAA (abdominal aortic aneurysm) (HCC)    Recurrent; s/p repair, now 3.8 cam; h/o ischemic bowel 2006  . Aortic stenosis    Moderate; Echo 12/09. moderate AS mean 19. AVA 0.88  . Basal cell carcinoma of nose 1980s  .  Bruises easily   . CAD (coronary artery disease)    s/p NSTEMI 1/07 with BMS to OM; cath 12/09 due to positive Myoview, LM ok. LAD 40%. LCX 80-90% mid. RCA chronically occluded with R --> L collats  . Carotid artery stenosis    R 60-79% L 0-39% (6/10) - stable  . Cataract   . CRI (chronic renal insufficiency)    Mild, Cr 1.3  .  Depression 10-06-04   With death of spouse  . Full dentures   . GERD (gastroesophageal reflux disease)   . HTN (hypertension)   . Hyperlipidemia    Increased LFTs on Vytorin. Changed to Lipitor.  . Hyperparathyroidism   . Hypothyroidism   . Incontinence   . Myocardial infarction ([place]) 10/06/05  . PAD (peripheral artery disease) (HCC)    s/p iliac stents  . Renal artery stenosis (HCC)    Atrophic L kidney. Moderate blockage on R  . Ventral hernia    Moderate sized  . Vitamin D deficiency    Past Surgical History:  Procedure Laterality Date  . ABDOMINAL AORTIC ANEURYSM REPAIR  10-06-1993, 07/01/2005  . ANGIOPLASTY  2003/10/07 - 07/01/2005  . APPENDECTOMY  1954  . breast biospy  1996  . BREAST SURGERY    . CATARACT EXTRACTION, BILATERAL    . CHOLECYSTECTOMY  07/01/2005  . CHOLECYSTECTOMY  1987  . COLONOSCOPY  07/07/2005   Colonic erosions, ulcers proctitis  . COLONOSCOPY W/ ENDOSCOPIC [place]  07/01/2005   [place] kidney diffuse atrophy  . CORONARY STENT PLACEMENT    . CORONARY STENT PLACEMENT  07/2005   MI   . HERNIA REPAIR  1997  . HOSP  04/2005   ? abd. prob. mesenteric ischemia  . HOSP  01/2006   Hosp for reflux (chest pain)  . ILIAC ARTERY STENT  Oct 07, 2003 - 07/01/2005   Stent placement, both legs - PVD in arteries  . NSTEMI  08/13/2005   1/07 s/p stent in circumflex  . TONSILLECTOMY  10/06/1941  . UPPER GASTROINTESTINAL ENDOSCOPY  07/07/2005   Nml     No outpatient medications have been marked as taking for the 11/03/18 encounter (Appointment) with Minna Merritts, MD.     Allergies:   Naproxen; Tramadol; Codeine; Morphine sulfate; and Sulfonamide derivatives   Social History   Tobacco Use  . Smoking status: Former Smoker    Last attempt to quit: 07/12/2005    Years since quitting: 13.3  . Smokeless tobacco: Never Used  . Tobacco comment: quit in 10-07-1995  Substance Use Topics  . Alcohol use: No  . Drug use: No     Current Outpatient Medications on File Prior to Visit  Medication Sig  Dispense Refill  . acetaminophen (TYLENOL) 500 MG tablet Take 1,000 mg by mouth 2 (two) times daily as needed for pain.    Marland Kitchen allopurinol (ZYLOPRIM) 100 MG tablet Take 0.5 tablets (50 mg total) by mouth daily. 45 tablet 3  . aspirin EC 81 MG tablet Take 1 tablet (81 mg total) by mouth daily.  3  . atorvastatin (LIPITOR) 80 MG tablet Take 1 tablet (80 mg total) by mouth daily. 90 tablet 3  . cephALEXin (KEFLEX) 500 MG capsule Take 1 capsule (500 mg total) by mouth 3 (three) times daily. 21 capsule 0  . Cholecalciferol 1000 UNITS capsule Take 3,000 Units by mouth daily.     . clopidogrel (PLAVIX) 75 MG tablet Take 1 tablet (75 mg total) by mouth daily. 90 tablet 3  .  colestipol (COLESTID) 1 g tablet Take 2 tablets (2 g total) by mouth 2 (two) times daily. 360 tablet 3  . estradiol (ESTRACE VAGINAL) 0.1 MG/GM vaginal cream Place 2 g vaginally 2 (two) times a week. Thursday and Sunday    . HYDROcodone-acetaminophen (NORCO/VICODIN) 5-325 MG tablet Take 1 tablet by mouth every 6 (six) hours as needed. 8 tablet 0  . isosorbide mononitrate (IMDUR) 30 MG 24 hr tablet TAKE 1 TABLET TWICE A DAY 180 tablet 0  . levothyroxine (SYNTHROID, LEVOTHROID) 112 MCG tablet Take 1 tablet (112 mcg total) by mouth daily. 90 tablet 3  . metoprolol tartrate (LOPRESSOR) 25 MG tablet TAKE ONE-HALF (1/2) TABLET TWICE A DAY 90 tablet 3  . sertraline (ZOLOFT) 50 MG tablet Take 1 tablet (50 mg total) by mouth daily. 90 tablet 3   No current facility-administered medications on file prior to visit.      Family Hx: The patient's family history includes Breast cancer in her sister; Cancer in her father, mother, and sister; Heart attack in her father and mother; Heart disease in her father and mother; Hyperlipidemia in her sister, sister, and son; Hypertension in her sister; Stroke in her father and mother; Throat cancer in her father. There is no history of Colon cancer.  ROS:   Please see the history of present illness.    ROS     Labs/Other Tests and Data Reviewed:    Recent Labs: 09/24/2018: ALT 14; BUN 31; Creatinine, Ser 1.52; Hemoglobin 12.0; Platelets 126; Potassium 4.0; Sodium 135   Recent Lipid Panel Lab Results  Component Value Date/Time   CHOL 158 09/19/2017 11:14 AM   CHOL 184 11/20/2014 08:22 AM   TRIG 198.0 (H) 09/19/2017 11:14 AM   HDL 41.20 09/19/2017 11:14 AM   HDL 45 11/20/2014 08:22 AM   CHOLHDL 4 09/19/2017 11:14 AM   LDLCALC 77 09/19/2017 11:14 AM   LDLCALC 86 11/20/2014 08:22 AM   LDLDIRECT 29.0 06/21/2016 10:11 AM    Wt Readings from Last 3 Encounters:  09/26/18 174 lb 12 oz (79.3 kg)  09/24/18 180 lb (81.6 kg)  04/14/18 184 lb (83.5 kg)     Exam:    Vital Signs: Vital signs may also be detailed in the HPI There were no vitals taken for this visit.  Wt Readings from Last 3 Encounters:  09/26/18 174 lb 12 oz (79.3 kg)  09/24/18 180 lb (81.6 kg)  04/14/18 184 lb (83.5 kg)   Temp Readings from Last 3 Encounters:  09/26/18 97.9 F (36.6 C) (Oral)  09/24/18 97.7 F (36.5 C) (Oral)  04/14/18 (!) 97 F (36.1 C) (Oral)   BP Readings from Last 3 Encounters:  09/26/18 100/70  09/24/18 140/68  04/14/18 130/90   Pulse Readings from Last 3 Encounters:  09/26/18 70  09/24/18 (!) 54  04/14/18 60     Well nourished, well developed female in no acute distress. Constitutional:  oriented to person, place, and time. No distress.  Head: Normocephalic and atraumatic.  Eyes:  no discharge. No scleral icterus.  Neck: Normal range of motion. Neck supple.  Pulmonary/Chest: No audible wheezing, no distress, appears comfortable Musculoskeletal: Normal range of motion.  no  tenderness or deformity.  Neurological:   Coordination normal. Full exam not performed Skin:  No rash Psychiatric:  normal mood and affect. behavior is normal. Thought content normal.    ASSESSMENT & PLAN:    No diagnosis found.   COVID-19 Education: The signs and symptoms of COVID-19 were discussed  with  the patient and how to seek care for testing (follow up with PCP or arrange E-visit).  The importance of social distancing was discussed today.  Patient Risk:   After full review of this patients clinical status, I feel that they are at least moderate risk at this time.  Time:   Today, I have spent 25 minutes with the patient with telehealth technology discussing the cardiac and medical problems/diagnoses detailed above   10 min spent reviewing the chart prior to patient visit today   Medication Adjustments/Labs and Tests Ordered: Current medicines are reviewed at length with the patient today.  Concerns regarding medicines are outlined above.   Tests Ordered: No tests ordered   Medication Changes: No changes made   Disposition: Follow-up in 6 months   Signed, Ida Rogue, MD  10/31/2018 6:14 PM    [place] [facility] [address] [place] [address], [place], [place] [address]

## 2018-11-03 ENCOUNTER — Encounter (HOSPITAL_COMMUNITY): Admission: RE | Payer: Self-pay | Source: Home / Self Care

## 2018-11-03 ENCOUNTER — Other Ambulatory Visit: Payer: Self-pay

## 2018-11-03 ENCOUNTER — Inpatient Hospital Stay (HOSPITAL_COMMUNITY): Admission: RE | Admit: 2018-11-03 | Payer: Medicare Other | Source: Home / Self Care | Admitting: Vascular Surgery

## 2018-11-03 ENCOUNTER — Telehealth: Payer: Medicare Other | Admitting: Cardiovascular Disease

## 2018-11-03 SURGERY — TRANSCAROTID ARTERY REVASCULARIZATION (TCAR)
Anesthesia: Choice | Laterality: Right

## 2018-11-05 ENCOUNTER — Other Ambulatory Visit: Payer: Self-pay | Admitting: Cardiovascular Disease

## 2018-11-09 NOTE — Progress Notes (Signed)
Virtual Visit via Video Note   This visit type was conducted due to national recommendations for restrictions regarding the COVID-19 Pandemic (e.g. social distancing) in an effort to limit this patient's exposure and mitigate transmission in our community.  Due to her co-morbid illnesses, this patient is at least at moderate risk for complications without adequate follow up.  This format is felt to be most appropriate for this patient at this time.  All issues noted in this document were discussed and addressed.  A limited physical exam was performed with this format.  Please refer to the patient's chart for her consent to telehealth for [facility].   I connected with  [name] on 11/10/18 by a video enabled telemedicine application and verified that I am speaking with the correct person using two identifiers. I discussed the limitations of evaluation and management by telemedicine. The patient expressed understanding and agreed to proceed.   Evaluation Performed:  Follow-up visit  Date:  11/10/2018   ID:  SOLIYANA Smith, DOB 08-Dec-1927, MRN [id number]  Patient Location:  [address] [place] [address]   Provider location:   [facility], [place] office  PCP:  Jinny Sanders, MD  Cardiologist:  Patsy Baltimore   Chief Complaint: Gait instability    History of Present Illness:    [name] is a 85 y.o. female who presents via audio/video conferencing for a telehealth visit today.   The patient does not symptoms concerning for COVID-19 infection (fever, chills, cough, or new SHORTNESS OF BREATH).   Patient has a past medical history of Moderate  aortic valve stenosis  coronary artery disease,  status post non-ST-elevation myocardial infarction in January 2007, bare-metal stenting to the obtuse marginal, totally occluded right coronary with Collaterals residual signifcant LCX disease not easily amenable to intervention, abdominal aortic  aneurysm repair and bilateral iliac stenting,  descending aorta aneurysm 4.8 cm with mural thrombus carotid artery stenosis (critical on the right) followed by Dr. Oneida Alar, 80-99%  renal artery stenosis with an atrophic left kidney and a moderate blockage on the right, hypertension  hyperlipidemia,   Anxiety more forgetful/memory issues She presents for follow-up of her coronary artery disease, aortic valve stenosis and PAD   December 2018 broke both her ankles [facility], rehab No surgery was required  Ambulating without assistance, no cane or walker  No chest pain, no SOB  some SOB with heavy exertion  Dr. Oneida Alar: Carotids, Aorta: Velocities in the right ICA are consistent with a 80-99% stenosis. Same as 2019  Delayed stent of carotid  Daughter thinks she is having trouble getting her words out, No  stroke symptoms  Overall feels well without complaints Denies any claudication type symptoms  Remote UTI, 09/24/2018 Looked dehydrated, CR 1.5  She is tolerating Lipitor 80 mg daily  Other past medical history She stopped smoking approximately 10 years ago, smoked for 20-30 years but the exact number of years is vague  carotid ultrasound showed progression of the stenosis on the right. Estimate is 80-99%.  She has seen Dr. Oneida Alar with repeat followup and ultrasound in 2014. Plan was made to watch her closely with possible stenting with any symptoms of TIA or stroke   Aortic ultrasound showed tortuous ectatic aorta biiliac bypass graft. Maximum dimensions 3.5 x 3.3 cm, dilated left common iliac artery 2 cm Previous echocardiogram EF 55-65% Mild to moderate AS    Prior CV studies:   The following studies  were reviewed today:  Echo 04/2018 Left ventricle: The cavity size was normal. Wall thickness was   increased in a pattern of moderate LVH. There was moderate   concentric hypertrophy. Systolic function was normal. The   estimated ejection fraction was in the  range of 60% to 65%.   Doppler parameters are consistent with abnormal left ventricular   relaxation (grade 1 diastolic dysfunction). Doppler parameters   are consistent with high ventricular filling pressure. - Aortic valve: Transvalvular velocity was increased. There was   moderate stenosis. There was mild to moderate regurgitation. Peak   velocity (S): 362 cm/s. Mean gradient (S): 28 mm Hg. - Aortic root: The aortic root was mildly dilated. - Ascending aorta: The ascending aorta was mildly dilated. - Mitral valve: Calcified annulus. Mildly thickened leaflets . - Right ventricle: The cavity size was normal. Systolic function   was normal. - Right atrium: The atrium was mildly dilated.   Past Medical History:  Diagnosis Date  . AAA (abdominal aortic aneurysm) (HCC)    Recurrent; s/p repair, now 3.8 cam; h/o ischemic bowel Oct 06, 2004  . Aortic stenosis    Moderate; Echo 12/09. moderate AS mean 19. AVA 0.88  . Basal cell carcinoma of nose 1980s  . Bruises easily   . CAD (coronary artery disease)    s/p NSTEMI 1/07 with BMS to OM; cath 12/09 due to positive Myoview, LM ok. LAD 40%. LCX 80-90% mid. RCA chronically occluded with R --> L collats  . Carotid artery stenosis    R 60-79% L 0-39% (6/10) - stable  . Cataract   . CRI (chronic renal insufficiency)    Mild, Cr 1.3  . Depression 2004-10-06   With death of spouse  . Full dentures   . GERD (gastroesophageal reflux disease)   . HTN (hypertension)   . Hyperlipidemia    Increased LFTs on Vytorin. Changed to Lipitor.  . Hyperparathyroidism   . Hypothyroidism   . Incontinence   . Myocardial infarction ([place]) 10-06-05  . PAD (peripheral artery disease) (HCC)    s/p iliac stents  . Renal artery stenosis (HCC)    Atrophic L kidney. Moderate blockage on R  . Ventral hernia    Moderate sized  . Vitamin D deficiency    Past Surgical History:  Procedure Laterality Date  . ABDOMINAL AORTIC ANEURYSM REPAIR  06-Oct-1993, 07/01/2005  . ANGIOPLASTY  2003/10/07  - 07/01/2005  . APPENDECTOMY  1954  . breast biospy  1996  . BREAST SURGERY    . CATARACT EXTRACTION, BILATERAL    . CHOLECYSTECTOMY  07/01/2005  . CHOLECYSTECTOMY  1987  . COLONOSCOPY  07/07/2005   Colonic erosions, ulcers proctitis  . COLONOSCOPY W/ ENDOSCOPIC [place]  07/01/2005   [place] kidney diffuse atrophy  . CORONARY STENT PLACEMENT    . CORONARY STENT PLACEMENT  07/2005   MI   . HERNIA REPAIR  1997  . HOSP  04/2005   ? abd. prob. mesenteric ischemia  . HOSP  01/2006   Hosp for reflux (chest pain)  . ILIAC ARTERY STENT  10/07/03 - 07/01/2005   Stent placement, both legs - PVD in arteries  . NSTEMI  08/13/2005   1/07 s/p stent in circumflex  . TONSILLECTOMY  [birth year]-Oct-1941  . UPPER GASTROINTESTINAL ENDOSCOPY  07/07/2005   Nml     Current Meds  Medication Sig  . acetaminophen (TYLENOL) 500 MG tablet Take 1,000 mg by mouth 2 (two) times daily as needed for pain.  Marland Kitchen allopurinol (ZYLOPRIM)  100 MG tablet Take 0.5 tablets (50 mg total) by mouth daily.  Marland Kitchen aspirin EC 81 MG tablet Take 1 tablet (81 mg total) by mouth daily.  Marland Kitchen atorvastatin (LIPITOR) 80 MG tablet Take 1 tablet (80 mg total) by mouth daily.  . cephALEXin (KEFLEX) 500 MG capsule Take 1 capsule (500 mg total) by mouth 3 (three) times daily.  . Cholecalciferol 1000 UNITS capsule Take 3,000 Units by mouth daily.   . clopidogrel (PLAVIX) 75 MG tablet Take 1 tablet (75 mg total) by mouth daily.  . colestipol (COLESTID) 1 g tablet Take 2 tablets (2 g total) by mouth 2 (two) times daily.  Marland Kitchen estradiol (ESTRACE VAGINAL) 0.1 MG/GM vaginal cream Place 2 g vaginally 2 (two) times a week. Thursday and Sunday  . HYDROcodone-acetaminophen (NORCO/VICODIN) 5-325 MG tablet Take 1 tablet by mouth every 6 (six) hours as needed.  . isosorbide mononitrate (IMDUR) 30 MG 24 hr tablet TAKE 1 TABLET TWICE A DAY  . levothyroxine (SYNTHROID, LEVOTHROID) 112 MCG tablet Take 1 tablet (112 mcg total) by mouth daily.  . metoprolol tartrate (LOPRESSOR) 25 MG tablet  TAKE ONE-HALF (1/2) TABLET TWICE A DAY  . sertraline (ZOLOFT) 50 MG tablet Take 1 tablet (50 mg total) by mouth daily.     Allergies:   Naproxen; Tramadol; Codeine; Morphine sulfate; and Sulfonamide derivatives   Social History   Tobacco Use  . Smoking status: Former Smoker    Last attempt to quit: 07/12/2005    Years since quitting: 13.3  . Smokeless tobacco: Never Used  . Tobacco comment: quit in 1997  Substance Use Topics  . Alcohol use: No  . Drug use: No     Current Outpatient Medications on File Prior to Visit  Medication Sig Dispense Refill  . acetaminophen (TYLENOL) 500 MG tablet Take 1,000 mg by mouth 2 (two) times daily as needed for pain.    Marland Kitchen allopurinol (ZYLOPRIM) 100 MG tablet Take 0.5 tablets (50 mg total) by mouth daily. 45 tablet 3  . aspirin EC 81 MG tablet Take 1 tablet (81 mg total) by mouth daily.  3  . atorvastatin (LIPITOR) 80 MG tablet Take 1 tablet (80 mg total) by mouth daily. 90 tablet 3  . cephALEXin (KEFLEX) 500 MG capsule Take 1 capsule (500 mg total) by mouth 3 (three) times daily. 21 capsule 0  . Cholecalciferol 1000 UNITS capsule Take 3,000 Units by mouth daily.     . clopidogrel (PLAVIX) 75 MG tablet Take 1 tablet (75 mg total) by mouth daily. 90 tablet 3  . colestipol (COLESTID) 1 g tablet Take 2 tablets (2 g total) by mouth 2 (two) times daily. 360 tablet 3  . estradiol (ESTRACE VAGINAL) 0.1 MG/GM vaginal cream Place 2 g vaginally 2 (two) times a week. Thursday and Sunday    . HYDROcodone-acetaminophen (NORCO/VICODIN) 5-325 MG tablet Take 1 tablet by mouth every 6 (six) hours as needed. 8 tablet 0  . isosorbide mononitrate (IMDUR) 30 MG 24 hr tablet TAKE 1 TABLET TWICE A DAY 180 tablet 0  . levothyroxine (SYNTHROID, LEVOTHROID) 112 MCG tablet Take 1 tablet (112 mcg total) by mouth daily. 90 tablet 3  . metoprolol tartrate (LOPRESSOR) 25 MG tablet TAKE ONE-HALF (1/2) TABLET TWICE A DAY 90 tablet 3  . sertraline (ZOLOFT) 50 MG tablet Take 1 tablet (50  mg total) by mouth daily. 90 tablet 3   No current facility-administered medications on file prior to visit.      Family Hx: The patient's  family history includes Breast cancer in her sister; Cancer in her father, mother, and sister; Heart attack in her father and mother; Heart disease in her father and mother; Hyperlipidemia in her sister, sister, and son; Hypertension in her sister; Stroke in her father and mother; Throat cancer in her father. There is no history of Colon cancer.  ROS:   Please see the history of present illness.    Review of Systems  Constitutional: Negative.   Respiratory: Negative.   Cardiovascular: Negative.   Gastrointestinal: Negative.   Musculoskeletal: Positive for falls.  Neurological: Negative.   Psychiatric/Behavioral: Negative.   All other systems reviewed and are negative.     Labs/Other Tests and Data Reviewed:    Recent Labs: 09/24/2018: ALT 14; BUN 31; Creatinine, Ser 1.52; Hemoglobin 12.0; Platelets 126; Potassium 4.0; Sodium 135   Recent Lipid Panel Lab Results  Component Value Date/Time   CHOL 158 09/19/2017 11:14 AM   CHOL 184 11/20/2014 08:22 AM   TRIG 198.0 (H) 09/19/2017 11:14 AM   HDL 41.20 09/19/2017 11:14 AM   HDL 45 11/20/2014 08:22 AM   CHOLHDL 4 09/19/2017 11:14 AM   LDLCALC 77 09/19/2017 11:14 AM   LDLCALC 86 11/20/2014 08:22 AM   LDLDIRECT 29.0 06/21/2016 10:11 AM    Wt Readings from Last 3 Encounters:  09/26/18 174 lb 12 oz (79.3 kg)  09/24/18 180 lb (81.6 kg)  04/14/18 184 lb (83.5 kg)     Exam:    Vital Signs: Vital signs may also be detailed in the HPI There were no vitals taken for this visit.  Wt Readings from Last 3 Encounters:  09/26/18 174 lb 12 oz (79.3 kg)  09/24/18 180 lb (81.6 kg)  04/14/18 184 lb (83.5 kg)   Temp Readings from Last 3 Encounters:  09/26/18 97.9 F (36.6 C) (Oral)  09/24/18 97.7 F (36.5 C) (Oral)  04/14/18 (!) 97 F (36.1 C) (Oral)   BP Readings from Last 3 Encounters:   09/26/18 100/70  09/24/18 140/68  04/14/18 130/90   Pulse Readings from Last 3 Encounters:  09/26/18 70  09/24/18 (!) 54  04/14/18 60    130/70 Pul/se 60 to 70 resp 16  Well nourished, well developed female in no acute distress. Constitutional:  oriented to person, place, and time. No distress.  Head: Normocephalic and atraumatic.  Eyes:  no discharge. No scleral icterus.  Neck: Normal range of motion. Neck supple.  Pulmonary/Chest: No audible wheezing, no distress, appears comfortable Musculoskeletal: Normal range of motion.  no  tenderness or deformity.  Neurological:   Coordination normal. Full exam not performed Skin:  No rash Psychiatric:  normal mood and affect. behavior is normal. Thought content normal.    ASSESSMENT & PLAN:    PAD (peripheral artery disease) (HCC) Severe carotid disease Discussed with patient and daughter Recommended follow-up with Dr. Oneida Alar, consider having this stented sooner rather than later Currently is without recent TIA/stroke We did discuss the increasing risk with watchful waiting Family nervous about coronavirus  Carotid stenosis, bilateral Severe unilateral disease, moderate on the other side  Atherosclerosis of native coronary artery with stable angina pectoris, unspecified whether native or transplanted heart [facility]) Currently with no symptoms of angina. No further workup at this time. Continue current medication regimen.  Descending thoracic aortic aneurysm (HCC)   CKD (chronic kidney disease) stage 3, GFR 30-59 ml/min (HCC) Creatinine 1.52 in the setting of urinary tract infection Typically creatinine 1.3, now drinking more fluids  Pure hypercholesterolemia Cholesterol is  at goal on the current lipid regimen. No changes to the medications were made.  Nonrheumatic aortic valve stenosis  Moderate stenosis, will need echocardiogram on annual basis  preop cardiovascular eval For carotid stent, Acceptable risk No further  cardiac testing needed    COVID-19 Education: The signs and symptoms of COVID-19 were discussed with the patient and how to seek care for testing (follow up with PCP or arrange E-visit).  The importance of social distancing was discussed today.  Patient Risk:   After full review of this patients clinical status, I feel that they are at least moderate risk at this time.  Time:   Today, I have spent 25 minutes with the patient with telehealth technology discussing the cardiac and medical problems/diagnoses detailed above   10 min spent reviewing the chart prior to patient visit today   Medication Adjustments/Labs and Tests Ordered: Current medicines are reviewed at length with the patient today.  Concerns regarding medicines are outlined above.   Tests Ordered: No tests ordered   Medication Changes: No changes made   Disposition: Follow-up in 12 months   Signed, Ida Rogue, MD  11/10/2018 2:09 PM    [place] [facility] [address] [place] [address], [place], [place] [address]

## 2018-11-10 ENCOUNTER — Other Ambulatory Visit: Payer: Self-pay

## 2018-11-10 ENCOUNTER — Telehealth (INDEPENDENT_AMBULATORY_CARE_PROVIDER_SITE_OTHER): Payer: Medicare Other | Admitting: Cardiovascular Disease

## 2018-11-10 DIAGNOSIS — E7800 Pure hypercholesterolemia, unspecified: Secondary | ICD-10-CM

## 2018-11-10 DIAGNOSIS — I739 Peripheral vascular disease, unspecified: Secondary | ICD-10-CM

## 2018-11-10 DIAGNOSIS — I6523 Occlusion and stenosis of bilateral carotid arteries: Secondary | ICD-10-CM

## 2018-11-10 DIAGNOSIS — N183 Chronic kidney disease, stage 3 unspecified: Secondary | ICD-10-CM

## 2018-11-10 DIAGNOSIS — I7123 Aneurysm of the descending thoracic aorta, without rupture: Secondary | ICD-10-CM

## 2018-11-10 DIAGNOSIS — I350 Nonrheumatic aortic (valve) stenosis: Secondary | ICD-10-CM

## 2018-11-10 DIAGNOSIS — I25118 Atherosclerotic heart disease of native coronary artery with other forms of angina pectoris: Secondary | ICD-10-CM | POA: Diagnosis not present

## 2018-11-10 DIAGNOSIS — I712 Thoracic aortic aneurysm, without rupture: Secondary | ICD-10-CM

## 2018-11-10 NOTE — Patient Instructions (Signed)

## 2018-11-21 ENCOUNTER — Other Ambulatory Visit: Payer: Self-pay | Admitting: Family Medicine

## 2018-11-27 ENCOUNTER — Ambulatory Visit: Payer: Medicare Other

## 2018-12-20 ENCOUNTER — Other Ambulatory Visit: Payer: Self-pay | Admitting: Family Medicine

## 2019-01-01 ENCOUNTER — Telehealth: Payer: Self-pay | Admitting: *Deleted

## 2019-01-01 NOTE — Telephone Encounter (Signed)
I will do the best that I can in this situation

## 2019-01-01 NOTE — Telephone Encounter (Signed)
Patient's daughter called requesting an appointment to see Dr. Diona Browner. Patient's daughter [name] stated that she has noticed a change in her mom's mental and physical ability in the last couple of weeks. [name] stated that she went over yesterday and her mom had her medications all mixed up. [name] stated that she realized yesterday that her mom has not been taking her medications and she does not know how long.[name] stated that she got her mom's medications all straightened out and in a pill case for her.  [name] stated that this has not suddenly happened, but feels that she does need to be seen by someone since Dr. Diona Browner is out of the office this week.Marland Kitchen Appointment scheduled tomorrow 01/02/19 with Dr. Lorelei Pont. Donzetta Sprung that if her mom gets worse before her appointment tomorrow she needs to call 911 or take her to the ER and she verbalized understanding.

## 2019-01-02 ENCOUNTER — Encounter (HOSPITAL_COMMUNITY): Payer: Self-pay

## 2019-01-02 ENCOUNTER — Observation Stay (HOSPITAL_COMMUNITY)
Admission: EM | Admit: 2019-01-02 | Discharge: 2019-01-04 | Disposition: A | Discharge status: Home / Self Care | Payer: Medicare Other | Attending: Internal Medicine | Admitting: Internal Medicine

## 2019-01-02 ENCOUNTER — Other Ambulatory Visit: Payer: Self-pay

## 2019-01-02 ENCOUNTER — Encounter: Payer: Self-pay | Admitting: Family Medicine

## 2019-01-02 ENCOUNTER — Ambulatory Visit (INDEPENDENT_AMBULATORY_CARE_PROVIDER_SITE_OTHER): Payer: Medicare Other | Admitting: Family Medicine

## 2019-01-02 VITALS — BP 124/80 | HR 56 | Temp 98.2°F | Ht 65.5 in | Wt 186.5 lb

## 2019-01-02 DIAGNOSIS — I350 Nonrheumatic aortic (valve) stenosis: Secondary | ICD-10-CM | POA: Insufficient documentation

## 2019-01-02 DIAGNOSIS — R4182 Altered mental status, unspecified: Secondary | ICD-10-CM

## 2019-01-02 DIAGNOSIS — Z79899 Other long term (current) drug therapy: Secondary | ICD-10-CM | POA: Diagnosis not present

## 2019-01-02 DIAGNOSIS — N183 Chronic kidney disease, stage 3 unspecified: Secondary | ICD-10-CM

## 2019-01-02 DIAGNOSIS — D500 Iron deficiency anemia secondary to blood loss (chronic): Secondary | ICD-10-CM | POA: Diagnosis not present

## 2019-01-02 DIAGNOSIS — I6529 Occlusion and stenosis of unspecified carotid artery: Secondary | ICD-10-CM | POA: Diagnosis not present

## 2019-01-02 DIAGNOSIS — E038 Other specified hypothyroidism: Secondary | ICD-10-CM | POA: Diagnosis not present

## 2019-01-02 DIAGNOSIS — T50901A Poisoning by unspecified drugs, medicaments and biological substances, accidental (unintentional), initial encounter: Secondary | ICD-10-CM | POA: Diagnosis not present

## 2019-01-02 DIAGNOSIS — E039 Hypothyroidism, unspecified: Secondary | ICD-10-CM | POA: Insufficient documentation

## 2019-01-02 DIAGNOSIS — D638 Anemia in other chronic diseases classified elsewhere: Secondary | ICD-10-CM

## 2019-01-02 DIAGNOSIS — F329 Major depressive disorder, single episode, unspecified: Secondary | ICD-10-CM | POA: Diagnosis not present

## 2019-01-02 DIAGNOSIS — Z9114 Patient's other noncompliance with medication regimen: Secondary | ICD-10-CM | POA: Insufficient documentation

## 2019-01-02 DIAGNOSIS — Z905 Acquired absence of kidney: Secondary | ICD-10-CM | POA: Diagnosis not present

## 2019-01-02 DIAGNOSIS — I252 Old myocardial infarction: Secondary | ICD-10-CM | POA: Diagnosis not present

## 2019-01-02 DIAGNOSIS — R531 Weakness: Secondary | ICD-10-CM | POA: Insufficient documentation

## 2019-01-02 DIAGNOSIS — E785 Hyperlipidemia, unspecified: Secondary | ICD-10-CM | POA: Insufficient documentation

## 2019-01-02 DIAGNOSIS — Z87891 Personal history of nicotine dependence: Secondary | ICD-10-CM | POA: Insufficient documentation

## 2019-01-02 DIAGNOSIS — E559 Vitamin D deficiency, unspecified: Secondary | ICD-10-CM | POA: Insufficient documentation

## 2019-01-02 DIAGNOSIS — R7989 Other specified abnormal findings of blood chemistry: Secondary | ICD-10-CM

## 2019-01-02 DIAGNOSIS — M109 Gout, unspecified: Secondary | ICD-10-CM | POA: Diagnosis not present

## 2019-01-02 DIAGNOSIS — Z1159 Encounter for screening for other viral diseases: Secondary | ICD-10-CM | POA: Insufficient documentation

## 2019-01-02 DIAGNOSIS — I714 Abdominal aortic aneurysm, without rupture, unspecified: Secondary | ICD-10-CM | POA: Diagnosis present

## 2019-01-02 DIAGNOSIS — I129 Hypertensive chronic kidney disease with stage 1 through stage 4 chronic kidney disease, or unspecified chronic kidney disease: Secondary | ICD-10-CM | POA: Diagnosis not present

## 2019-01-02 DIAGNOSIS — I2510 Atherosclerotic heart disease of native coronary artery without angina pectoris: Secondary | ICD-10-CM | POA: Diagnosis not present

## 2019-01-02 DIAGNOSIS — D696 Thrombocytopenia, unspecified: Secondary | ICD-10-CM | POA: Diagnosis not present

## 2019-01-02 DIAGNOSIS — R2689 Other abnormalities of gait and mobility: Secondary | ICD-10-CM | POA: Insufficient documentation

## 2019-01-02 DIAGNOSIS — I739 Peripheral vascular disease, unspecified: Secondary | ICD-10-CM | POA: Diagnosis not present

## 2019-01-02 DIAGNOSIS — N179 Acute kidney failure, unspecified: Principal | ICD-10-CM | POA: Insufficient documentation

## 2019-01-02 DIAGNOSIS — Z7982 Long term (current) use of aspirin: Secondary | ICD-10-CM | POA: Diagnosis not present

## 2019-01-02 DIAGNOSIS — Z79890 Hormone replacement therapy (postmenopausal): Secondary | ICD-10-CM | POA: Insufficient documentation

## 2019-01-02 DIAGNOSIS — D649 Anemia, unspecified: Secondary | ICD-10-CM

## 2019-01-02 DIAGNOSIS — R945 Abnormal results of liver function studies: Secondary | ICD-10-CM

## 2019-01-02 DIAGNOSIS — Z66 Do not resuscitate: Secondary | ICD-10-CM | POA: Insufficient documentation

## 2019-01-02 DIAGNOSIS — Z7902 Long term (current) use of antithrombotics/antiplatelets: Secondary | ICD-10-CM | POA: Insufficient documentation

## 2019-01-02 DIAGNOSIS — D509 Iron deficiency anemia, unspecified: Secondary | ICD-10-CM

## 2019-01-02 DIAGNOSIS — I10 Essential (primary) hypertension: Secondary | ICD-10-CM | POA: Diagnosis present

## 2019-01-02 LAB — BASIC METABOLIC PANEL
Anion gap: 9 (ref 5–15)
BUN: 31 mg/dL — ABNORMAL HIGH (ref 6–23)
BUN: 34 mg/dL — ABNORMAL HIGH (ref 8–23)
CO2: 28 mEq/L (ref 19–32)
CO2: 26 mmol/L (ref 22–32)
Calcium: 9.2 mg/dL (ref 8.4–10.5)
Calcium: 9.6 mg/dL (ref 8.9–10.3)
Chloride: 100 mEq/L (ref 96–112)
Chloride: 105 mmol/L (ref 98–111)
Creatinine, Ser: 2.29 mg/dL — ABNORMAL HIGH (ref 0.40–1.20)
Creatinine, Ser: 2.57 mg/dL — ABNORMAL HIGH (ref 0.44–1.00)
GFR calc Af Amer: 18 mL/min — ABNORMAL LOW (ref >60)
GFR calc non Af Amer: 16 mL/min — ABNORMAL LOW (ref >60)
GFR: 20.06 mL/min — ABNORMAL LOW (ref >60.00)
Glucose, Bld: 83 mg/dL (ref 70–99)
Glucose, Bld: 96 mg/dL (ref 70–99)
Potassium: 4.4 mEq/L (ref 3.5–5.1)
Potassium: 4.1 mmol/L (ref 3.5–5.1)
Sodium: 137 mEq/L (ref 135–145)
Sodium: 140 mmol/L (ref 135–145)

## 2019-01-02 LAB — CBC WITH DIFFERENTIAL/PLATELET
Basophils Absolute: 0.1 10*3/uL (ref 0.0–0.1)
Basophils Relative: 1.5 % (ref 0.0–3.0)
Eosinophils Absolute: 0.2 10*3/uL (ref 0.0–0.7)
Eosinophils Relative: 2.5 % (ref 0.0–5.0)
HCT: 26.2 % — ABNORMAL LOW (ref 36.0–46.0)
Hemoglobin: 8.5 g/dL — ABNORMAL LOW (ref 12.0–15.0)
Lymphocytes Relative: 18.9 % (ref 12.0–46.0)
Lymphs Abs: 1.5 10*3/uL (ref 0.7–4.0)
MCHC: 32.5 g/dL (ref 30.0–36.0)
MCV: 76.8 fl — ABNORMAL LOW (ref 78.0–100.0)
Monocytes Absolute: 0.4 10*3/uL (ref 0.1–1.0)
Monocytes Relative: 5.4 % (ref 3.0–12.0)
Neutro Abs: 5.6 10*3/uL (ref 1.4–7.7)
Neutrophils Relative %: 71.7 % (ref 43.0–77.0)
Platelets: 132 10*3/uL — ABNORMAL LOW (ref 150.0–400.0)
RBC: 3.4 Mil/uL — ABNORMAL LOW (ref 3.87–5.11)
RDW: 20.5 % — ABNORMAL HIGH (ref 11.5–15.5)
WBC: 7.9 10*3/uL (ref 4.0–10.5)

## 2019-01-02 LAB — URINALYSIS, ROUTINE W REFLEX MICROSCOPIC
Bilirubin Urine: NEGATIVE
Glucose, UA: NEGATIVE mg/dL
Ketones, ur: NEGATIVE mg/dL
Leukocytes,Ua: NEGATIVE
Nitrite: NEGATIVE
Protein, ur: 100 mg/dL — AB
Specific Gravity, Urine: 1.015 (ref 1.005–1.030)
pH: 6 (ref 5.0–8.0)

## 2019-01-02 LAB — HEPATIC FUNCTION PANEL
ALT: 53 U/L — ABNORMAL HIGH (ref 0–35)
AST: 97 U/L — ABNORMAL HIGH (ref 0–37)
Albumin: 4.1 g/dL (ref 3.5–5.2)
Alkaline Phosphatase: 40 U/L (ref 39–117)
Bilirubin, Direct: 0.2 mg/dL (ref 0.0–0.3)
Total Bilirubin: 0.9 mg/dL (ref 0.2–1.2)
Total Protein: 6.6 g/dL (ref 6.0–8.3)

## 2019-01-02 LAB — POC URINALSYSI DIPSTICK (AUTOMATED)
Bilirubin, UA: NEGATIVE
Glucose, UA: NEGATIVE
Ketones, UA: NEGATIVE
Nitrite, UA: NEGATIVE
Protein, UA: POSITIVE — AB
Spec Grav, UA: 1.02 (ref 1.010–1.025)
Urobilinogen, UA: 1 E.U./dL
pH, UA: 6 (ref 5.0–8.0)

## 2019-01-02 LAB — POCT UA - MICROSCOPIC ONLY

## 2019-01-02 LAB — CBC
HCT: 27 % — ABNORMAL LOW (ref 36.0–46.0)
Hemoglobin: 8.2 g/dL — ABNORMAL LOW (ref 12.0–15.0)
MCH: 24.6 pg — ABNORMAL LOW (ref 26.0–34.0)
MCHC: 30.4 g/dL (ref 30.0–36.0)
MCV: 80.8 fL (ref 80.0–100.0)
Platelets: 132 10*3/uL — ABNORMAL LOW (ref 150–400)
RBC: 3.34 MIL/uL — ABNORMAL LOW (ref 3.87–5.11)
RDW: 20 % — ABNORMAL HIGH (ref 11.5–15.5)
WBC: 7.2 10*3/uL (ref 4.0–10.5)
nRBC: 0 % (ref 0.0–0.2)

## 2019-01-02 LAB — TSH: TSH: 197.26 u[IU]/mL — ABNORMAL HIGH (ref 0.35–4.50)

## 2019-01-02 NOTE — ED Triage Notes (Signed)
Pt arrives POV for eval of abnormal labwork. Daughter reports that she noted over the weekend that she wasn't acting herself. She noted her to be more confused than usual. Saw PCP this AM and MD called her this evening and told her that she had an AKI. Daughter reports creatinine 2.3, thyroid level is "off the charts" and hemoglobin is 8.

## 2019-01-02 NOTE — Progress Notes (Signed)
T. , MD [facility] [place] [facility] [place], [address] Phone: [phone]  FAX: [phone]  CAMARIA GERALD - 85 y.o. female  MRN [id number]  Date of Birth: Nov 26, 1927  Visit Date: 01/02/2019  PCP: Jinny Sanders, MD  Referred by: Jinny Sanders, MD  Chief Complaint  Patient presents with  . Mental & Physical Changes   Subjective:   [name] is a 85 y.o. very pleasant female patient who presents with the following:  Daughter was alarmed.   Got some of her medication confused and mixed up her meds all over the weekend. Lives by herself.  Looked like she was drugged and under some slow motion.   Her daughter thought that she essentially just was not quite at her normal self.  They did observe that she did not have any ataxia, slurred speech, facial drooping or any other concerning symptoms for stroke.  Up until now, she had been taking care of her self relatively well.  She had been stopping her medications for an unknown amount of time.  Titrated back to normal down to Tuesday.   Pill box.  Loud heart murmur  Past Medical History, Surgical History, Social History, Family History, Problem List, Medications, and Allergies have been reviewed and updated if relevant.  Patient Active Problem List   Diagnosis Date Noted  . Pyelonephritis 09/26/2018  . Osteoporosis 11/15/2017  . GAD (generalized anxiety disorder) 05/24/2017  . Major depressive disorder, recurrent episode, moderate ([place]) 05/25/2016  . Counseling regarding end of life decision making 12/13/2014  . Descending thoracic aortic aneurysm ([place]) 05/30/2014  . Vitamin D deficiency 12/26/2012  . Uterine prolapse 06/23/2012  . Gout 06/06/2012  . INSOMNIA, CHRONIC, MILD 07/10/2010  . Carotid stenosis 11/01/2008  . Aortic valve stenosis 09/25/2008  . Abdominal aortic aneurysm ([place]) 09/24/2008  . LOW BACK PAIN, MILD  07/24/2008  . Coronary atherosclerosis 06/21/2008  . VENTRAL HERNIA 05/16/2008  . PULMONARY NODULE, RIGHT MIDDLE LOBE 02/01/2008  . CYSTOCELE WITH INCOMPLETE UTERINE PROLAPSE 02/20/2007  . Hypothyroidism 02/17/2007  . Hyperlipidemia 02/17/2007  . Essential hypertension 02/17/2007  . PAD (peripheral artery disease) ([place]) 02/17/2007  . GERD 02/17/2007  . CKD (chronic kidney disease) stage 3, GFR 30-59 ml/min (HCC) 02/17/2007  . ISCHEMIC COLITIS 05/19/2005    Past Medical History:  Diagnosis Date  . AAA (abdominal aortic aneurysm) (HCC)    Recurrent; s/p repair, now 3.8 cam; h/o ischemic bowel 09/03/04  . Aortic stenosis    Moderate; Echo 12/09. moderate AS mean 19. AVA 0.88  . Basal cell carcinoma of nose 1980s  . Bruises easily   . CAD (coronary artery disease)    s/p NSTEMI 1/07 with BMS to OM; cath 12/09 due to positive Myoview, LM ok. LAD 40%. LCX 80-90% mid. RCA chronically occluded with R --> L collats  . Carotid artery stenosis    R 60-79% L 0-39% (6/10) - stable  . Cataract   . CRI (chronic renal insufficiency)    Mild, Cr 1.3  . Depression 09-03-04   With death of spouse  . Full dentures   . GERD (gastroesophageal reflux disease)   . HTN (hypertension)   . Hyperlipidemia    Increased LFTs on Vytorin. Changed to Lipitor.  . Hyperparathyroidism   . Hypothyroidism   . Incontinence   . Myocardial infarction ([place]) 09-03-2005  . PAD (peripheral artery disease) (HCC)    s/p iliac  stents  . Renal artery stenosis (HCC)    Atrophic L kidney. Moderate blockage on R  . Ventral hernia    Moderate sized  . Vitamin D deficiency     Past Surgical History:  Procedure Laterality Date  . ABDOMINAL AORTIC ANEURYSM REPAIR  1995, 07/01/2005  . ANGIOPLASTY  2005 - 07/01/2005  . APPENDECTOMY  1954  . breast biospy  1996  . BREAST SURGERY    . CATARACT EXTRACTION, BILATERAL    . CHOLECYSTECTOMY  07/01/2005  . CHOLECYSTECTOMY  1987  . COLONOSCOPY  07/07/2005   Colonic erosions, ulcers  proctitis  . COLONOSCOPY W/ ENDOSCOPIC [place]  07/01/2005   [place] kidney diffuse atrophy  . CORONARY STENT PLACEMENT    . CORONARY STENT PLACEMENT  07/2005   MI   . HERNIA REPAIR  1997  . HOSP  Jun 01, 2005   ? abd. prob. mesenteric ischemia  . HOSP  01/2006   Hosp for reflux (chest pain)  . ILIAC ARTERY STENT  2005 - 07/01/2005   Stent placement, both legs - PVD in arteries  . NSTEMI  08/13/2005   1/07 s/p stent in circumflex  . TONSILLECTOMY  1943  . UPPER GASTROINTESTINAL ENDOSCOPY  07/07/2005   Nml    Social History   Socioeconomic History  . Marital status: Widowed    Spouse name: Not on file  . Number of children: Not on file  . Years of education: Not on file  . Highest education level: Not on file  Occupational History  . Not on file  Social Needs  . Financial resource strain: Not on file  . Food insecurity    Worry: Not on file    Inability: Not on file  . Transportation needs    Medical: Not on file    Non-medical: Not on file  Tobacco Use  . Smoking status: Former Smoker    Quit date: 07/12/2005    Years since quitting: 13.4  . Smokeless tobacco: Never Used  . Tobacco comment: quit in 1997  Substance and Sexual Activity  . Alcohol use: No  . Drug use: No  . Sexual activity: Not on file  Lifestyle  . Physical activity    Days per week: Not on file    Minutes per session: Not on file  . Stress: Not on file  Relationships  . Social Herbalist on phone: Not on file    Gets together: Not on file    Attends religious service: Not on file    Active member of club or organization: Not on file    Attends meetings of clubs or organizations: Not on file    Relationship status: Not on file  . Intimate partner violence    Fear of current or ex partner: Not on file    Emotionally abused: Not on file    Physically abused: Not on file    Forced sexual activity: Not on file  Other Topics Concern  . Not on file  Social History Narrative   Husband deceased  06/01/2005 from multiple myeloma, lives alone near sister..    Has living will, HCPOA: daughter: [name]   Full code. Has lifealert. (Reviewed 2014)          Family History  Problem Relation Age of Onset  . Heart attack Mother        MI  . Cancer Mother   . Heart disease Mother        heart attack  .  Stroke Mother   . Cancer Father        throat  . Heart attack Father        MI  . Throat cancer Father   . Heart disease Father        heart attack   . Stroke Father   . Cancer Sister        breast  . Breast cancer Sister   . Hyperlipidemia Sister   . Hyperlipidemia Sister   . Hypertension Sister   . Hyperlipidemia Son   . Colon cancer Neg Hx     Allergies  Allergen Reactions  . Naproxen Nausea And Vomiting  . Tramadol Nausea And Vomiting  . Codeine Nausea And Vomiting and Rash  . Morphine Sulfate Nausea And Vomiting and Rash  . Sulfonamide Derivatives Nausea And Vomiting and Rash    Medication list reviewed and updated in full in [place].   GEN: No acute illnesses, no fevers, chills. GI: No n/v/d, eating normally Pulm: No SOB Interactive and getting along well at home.  Otherwise, ROS is as per the HPI.  Objective:   BP 124/80   Pulse (!) 56   Temp 98.2 F (36.8 C) (Oral)   Ht 5' 5.5" (1.664 m)   Wt 186 lb 8 oz (84.6 kg)   SpO2 93%   BMI 30.56 kg/m   GEN: WDWN, NAD, Non-toxic, oriented x 3 but hard of hearing HEENT: Atraumatic, Normocephalic. Neck supple. No masses, No LAD. Ears and Nose: No external deformity. CV: 3/6 SEM PULM: CTA B, no wheezes, crackles, rhonchi. No retractions. No resp. distress. No accessory muscle use. EXTR: No c/c/e NEURO Normal gait.  PSYCH: Normally interactive. Calm demeanor.   Laboratory and Imaging Data:  Recent Results (from the past 2160 hour(s))  POCT Urinalysis Dipstick (Automated)     Status: Abnormal   Collection Time: 01/02/19 10:14 AM  Result Value Ref Range   Color, UA Yellow    Clarity, UA  Clear    Glucose, UA Negative Negative   Bilirubin, UA Negative    Ketones, UA Negative    Spec Grav, UA 1.020 1.010 - 1.025   Blood, UA Moderate    pH, UA 6.0 5.0 - 8.0   Protein, UA Positive (A) Negative   Urobilinogen, UA 1.0 0.2 or 1.0 E.U./dL   Nitrite, UA Negative    Leukocytes, UA Trace (A) Negative  POCT UA - Microscopic Only     Status: None   Collection Time: 01/02/19 10:45 AM  Result Value Ref Range   WBC, Ur, HPF, POC 1-5    RBC, urine, microscopic 10-15    Bacteria, U Microscopic 1+    Mucus, UA     Epithelial cells, urine per micros 10-15    Crystals, Ur, HPF, POC     Casts, Ur, LPF, POC     Yeast, UA    Basic metabolic panel     Status: Abnormal   Collection Time: 01/02/19 11:06 AM  Result Value Ref Range   Sodium 137 135 - 145 mEq/L   Potassium 4.4 3.5 - 5.1 mEq/L   Chloride 100 96 - 112 mEq/L   CO2 28 19 - 32 mEq/L   Glucose, Bld 83 70 - 99 mg/dL   BUN 31 (H) 6 - 23 mg/dL   Creatinine, Ser 2.29 (H) 0.40 - 1.20 mg/dL   Calcium 9.2 8.4 - 10.5 mg/dL   GFR 20.06 (L) >60.00 mL/min  CBC with Differential/Platelet  Status: Abnormal   Collection Time: 01/02/19 11:06 AM  Result Value Ref Range   WBC 7.9 4.0 - 10.5 K/uL   RBC 3.40 (L) 3.87 - 5.11 Mil/uL   Hemoglobin 8.5 aL (L) 12.0 - 15.0 g/dL   HCT 26.2 aL (L) 36.0 - 46.0 %   MCV 76.8 (L) 78.0 - 100.0 fl   MCHC 32.5 30.0 - 36.0 g/dL   RDW 20.5 (H) 11.5 - 15.5 %   Platelets 132.0 (L) 150.0 - 400.0 K/uL   Neutrophils Relative % 71.7 43.0 - 77.0 %   Lymphocytes Relative 18.9 12.0 - 46.0 %   Monocytes Relative 5.4 3.0 - 12.0 %   Eosinophils Relative 2.5 0.0 - 5.0 %   Basophils Relative 1.5 0.0 - 3.0 %   Neutro Abs 5.6 1.4 - 7.7 K/uL   Lymphs Abs 1.5 0.7 - 4.0 K/uL   Monocytes Absolute 0.4 0.1 - 1.0 K/uL   Eosinophils Absolute 0.2 0.0 - 0.7 K/uL   Basophils Absolute 0.1 0.0 - 0.1 K/uL  Hepatic function panel     Status: Abnormal   Collection Time: 01/02/19 11:06 AM  Result Value Ref Range   Total  Bilirubin 0.9 0.2 - 1.2 mg/dL   Bilirubin, Direct 0.2 0.0 - 0.3 mg/dL   Alkaline Phosphatase 40 39 - 117 U/L   AST 97 (H) 0 - 37 U/L   ALT 53 (H) 0 - 35 U/L   Total Protein 6.6 6.0 - 8.3 g/dL   Albumin 4.1 3.5 - 5.2 g/dL  TSH     Status: Abnormal   Collection Time: 01/02/19 11:06 AM  Result Value Ref Range   TSH 197.26 (H) 0.35 - 4.50 uIU/mL    Assessment and Plan:     ICD-10-CM   1. Altered mental status, unspecified altered mental status type  R41.82 POCT Urinalysis Dipstick (Automated)    Urine Culture    POCT UA - Microscopic Only    Basic metabolic panel    CBC with Differential/Platelet    Hepatic function panel  2. CKD (chronic kidney disease) stage 3, GFR 30-59 ml/min (HCC)  N18.3   3. Other specified hypothyroidism  E03.8 TSH  4. Weakness  N56.2 Basic metabolic panel    CBC with Differential/Platelet    Hepatic function panel  5. Accidental medication error, initial encounter  Z30.865H Basic metabolic panel    CBC with Differential/Platelet    Hepatic function panel  6. Acute renal failure, unspecified acute renal failure type ([place])  N17.9   7. Elevated LFTs  R94.5   8. Anemia in other chronic diseases classified elsewhere  D63.8    The patient appeared changed but improved since the weekend per her daughter, but a change in baseline.  After further work-up she is in acute renal failure, LFT's have tripled, notable Hgb of 8, and a TSH of 200.  I called her daughter and the [place] ER to direct them to go to the ER via private vehicle.   Follow-up: No follow-ups on file.  No orders of the defined types were placed in this encounter.  Orders Placed This Encounter  Procedures  . Urine Culture  . Basic metabolic panel  . CBC with Differential/Platelet  . Hepatic function panel  . TSH  . POCT Urinalysis Dipstick (Automated)  . POCT UA - Microscopic Only    Signed,  Vadie Principato T. Burney [place], MD   Outpatient Encounter Medications as of 01/02/2019  Medication  Sig  . acetaminophen (TYLENOL) 500 MG  tablet Take 1,000 mg by mouth 2 (two) times daily as needed for pain.  Marland Kitchen allopurinol (ZYLOPRIM) 100 MG tablet Take 0.5 tablets (50 mg total) by mouth daily.  Marland Kitchen aspirin EC 81 MG tablet Take 1 tablet (81 mg total) by mouth daily.  Marland Kitchen atorvastatin (LIPITOR) 80 MG tablet Take 1 tablet (80 mg total) by mouth daily.  . Cholecalciferol 1000 UNITS capsule Take 3,000 Units by mouth daily.   . clopidogrel (PLAVIX) 75 MG tablet Take 1 tablet (75 mg total) by mouth daily.  . colestipol (COLESTID) 1 g tablet Take 2 tablets (2 g total) by mouth 2 (two) times daily.  Marland Kitchen estradiol (ESTRACE VAGINAL) 0.1 MG/GM vaginal cream Place 2 g vaginally 2 (two) times a week. Thursday and Sunday  . isosorbide mononitrate (IMDUR) 30 MG 24 hr tablet TAKE 1 TABLET TWICE A DAY  . levothyroxine (SYNTHROID, LEVOTHROID) 112 MCG tablet Take 1 tablet (112 mcg total) by mouth daily.  . metoprolol tartrate (LOPRESSOR) 25 MG tablet TAKE ONE-HALF (1/2) TABLET TWICE A DAY  . sertraline (ZOLOFT) 50 MG tablet Take 1 tablet (50 mg total) by mouth daily.  . [DISCONTINUED] cephALEXin (KEFLEX) 500 MG capsule Take 1 capsule (500 mg total) by mouth 3 (three) times daily.  . [DISCONTINUED] HYDROcodone-acetaminophen (NORCO/VICODIN) 5-325 MG tablet Take 1 tablet by mouth every 6 (six) hours as needed.   No facility-administered encounter medications on file as of 01/02/2019.

## 2019-01-03 ENCOUNTER — Observation Stay (HOSPITAL_COMMUNITY): Payer: Medicare Other

## 2019-01-03 ENCOUNTER — Encounter (HOSPITAL_COMMUNITY): Payer: Self-pay | Admitting: Internal Medicine

## 2019-01-03 DIAGNOSIS — N179 Acute kidney failure, unspecified: Secondary | ICD-10-CM | POA: Diagnosis present

## 2019-01-03 DIAGNOSIS — E038 Other specified hypothyroidism: Secondary | ICD-10-CM

## 2019-01-03 DIAGNOSIS — D509 Iron deficiency anemia, unspecified: Secondary | ICD-10-CM

## 2019-01-03 DIAGNOSIS — D649 Anemia, unspecified: Secondary | ICD-10-CM

## 2019-01-03 DIAGNOSIS — I714 Abdominal aortic aneurysm, without rupture: Secondary | ICD-10-CM

## 2019-01-03 LAB — CBC WITH DIFFERENTIAL/PLATELET
Abs Immature Granulocytes: 0.03 10*3/uL (ref 0.00–0.07)
Basophils Absolute: 0.1 10*3/uL (ref 0.0–0.1)
Basophils Relative: 1 %
Eosinophils Absolute: 0.2 10*3/uL (ref 0.0–0.5)
Eosinophils Relative: 4 %
HCT: 27.5 % — ABNORMAL LOW (ref 36.0–46.0)
Hemoglobin: 7.9 g/dL — ABNORMAL LOW (ref 12.0–15.0)
Immature Granulocytes: 1 %
Lymphocytes Relative: 25 %
Lymphs Abs: 1.7 10*3/uL (ref 0.7–4.0)
MCH: 24.4 pg — ABNORMAL LOW (ref 26.0–34.0)
MCHC: 28.7 g/dL — ABNORMAL LOW (ref 30.0–36.0)
MCV: 84.9 fL (ref 80.0–100.0)
Monocytes Absolute: 0.3 10*3/uL (ref 0.1–1.0)
Monocytes Relative: 5 %
Neutro Abs: 4.3 10*3/uL (ref 1.7–7.7)
Neutrophils Relative %: 64 %
Platelets: 113 10*3/uL — ABNORMAL LOW (ref 150–400)
RBC: 3.24 MIL/uL — ABNORMAL LOW (ref 3.87–5.11)
RDW: 20 % — ABNORMAL HIGH (ref 11.5–15.5)
WBC: 6.6 10*3/uL (ref 4.0–10.5)
nRBC: 0 % (ref 0.0–0.2)

## 2019-01-03 LAB — CK: Total CK: 2604 U/L — ABNORMAL HIGH (ref 38–234)

## 2019-01-03 LAB — COMPREHENSIVE METABOLIC PANEL
ALT: 44 U/L (ref 0–44)
AST: 90 U/L — ABNORMAL HIGH (ref 15–41)
Albumin: 3.5 g/dL (ref 3.5–5.0)
Alkaline Phosphatase: 37 U/L — ABNORMAL LOW (ref 38–126)
Anion gap: 13 (ref 5–15)
BUN: 32 mg/dL — ABNORMAL HIGH (ref 8–23)
CO2: 22 mmol/L (ref 22–32)
Calcium: 8.9 mg/dL (ref 8.9–10.3)
Chloride: 104 mmol/L (ref 98–111)
Creatinine, Ser: 2.44 mg/dL — ABNORMAL HIGH (ref 0.44–1.00)
GFR calc Af Amer: 20 mL/min — ABNORMAL LOW (ref >60)
GFR calc non Af Amer: 17 mL/min — ABNORMAL LOW (ref >60)
Glucose, Bld: 87 mg/dL (ref 70–99)
Potassium: 3.9 mmol/L (ref 3.5–5.1)
Sodium: 139 mmol/L (ref 135–145)
Total Bilirubin: 0.9 mg/dL (ref 0.3–1.2)
Total Protein: 6 g/dL — ABNORMAL LOW (ref 6.5–8.1)

## 2019-01-03 LAB — RETICULOCYTES
Immature Retic Fract: 24.7 % — ABNORMAL HIGH (ref 2.3–15.9)
RBC.: 3.24 MIL/uL — ABNORMAL LOW (ref 3.87–5.11)
Retic Count, Absolute: 53.8 10*3/uL (ref 19.0–186.0)
Retic Ct Pct: 1.7 % (ref 0.4–3.1)

## 2019-01-03 LAB — URINE CULTURE
MICRO NUMBER:: 598342
SPECIMEN QUALITY:: ADEQUATE

## 2019-01-03 LAB — FOLATE: Folate: 10.4 ng/mL (ref >5.9)

## 2019-01-03 LAB — TSH: TSH: 199.528 u[IU]/mL — ABNORMAL HIGH (ref 0.350–4.500)

## 2019-01-03 LAB — POC OCCULT BLOOD, ED: Fecal Occult Bld: NEGATIVE

## 2019-01-03 LAB — IRON AND TIBC
Iron: 35 ug/dL (ref 28–170)
Saturation Ratios: 7 % — ABNORMAL LOW (ref 10.4–31.8)
TIBC: 522 ug/dL — ABNORMAL HIGH (ref 250–450)
UIBC: 487 ug/dL

## 2019-01-03 LAB — CBG MONITORING, ED: Glucose-Capillary: 80 mg/dL (ref 70–99)

## 2019-01-03 LAB — SODIUM, URINE, RANDOM: Sodium, Ur: 96 mmol/L

## 2019-01-03 LAB — CREATININE, URINE, RANDOM: Creatinine, Urine: 146.95 mg/dL

## 2019-01-03 LAB — VITAMIN B12: Vitamin B-12: 234 pg/mL (ref 180–914)

## 2019-01-03 LAB — FERRITIN: Ferritin: 57 ng/mL (ref 11–307)

## 2019-01-03 LAB — T4, FREE: Free T4: <0.25 ng/dL — ABNORMAL LOW (ref 0.61–1.12)

## 2019-01-03 MED ORDER — CLOPIDOGREL BISULFATE 75 MG PO TABS
75.0000 mg | ORAL_TABLET | Freq: Every day | ORAL | Status: DC
  Administered 2019-01-03 – 2019-01-04 (×2): 75 mg via ORAL
  Filled 2019-01-03 (×2): qty 1

## 2019-01-03 MED ORDER — ACETAMINOPHEN 650 MG RE SUPP: 650.0000 mg | Freq: Four times a day (QID) | RECTAL | Status: DC | PRN

## 2019-01-03 MED ORDER — ATORVASTATIN CALCIUM 80 MG PO TABS
80.0000 mg | ORAL_TABLET | Freq: Every day | ORAL | Status: DC
  Administered 2019-01-03 – 2019-01-04 (×2): 80 mg via ORAL
  Filled 2019-01-03 (×2): qty 1

## 2019-01-03 MED ORDER — ENOXAPARIN SODIUM 30 MG/0.3ML ~~LOC~~ SOLN
30.0000 mg | Freq: Every day | SUBCUTANEOUS | Status: DC
  Administered 2019-01-03 – 2019-01-04 (×2): 30 mg via SUBCUTANEOUS
  Filled 2019-01-03 (×3): qty 0.3

## 2019-01-03 MED ORDER — ACETAMINOPHEN 325 MG PO TABS: 650.0000 mg | ORAL_TABLET | Freq: Four times a day (QID) | ORAL | Status: DC | PRN

## 2019-01-03 MED ORDER — ASPIRIN EC 81 MG PO TBEC
81.0000 mg | DELAYED_RELEASE_TABLET | Freq: Every day | ORAL | Status: DC
  Administered 2019-01-03 – 2019-01-04 (×2): 81 mg via ORAL
  Filled 2019-01-03 (×2): qty 1

## 2019-01-03 MED ORDER — ONDANSETRON HCL 4 MG/2ML IJ SOLN: 4.0000 mg | Freq: Four times a day (QID) | INTRAMUSCULAR | Status: DC | PRN

## 2019-01-03 MED ORDER — ISOSORBIDE MONONITRATE ER 30 MG PO TB24
30.0000 mg | ORAL_TABLET | Freq: Two times a day (BID) | ORAL | Status: DC
  Administered 2019-01-03 – 2019-01-04 (×3): 30 mg via ORAL
  Filled 2019-01-03 (×3): qty 1

## 2019-01-03 MED ORDER — SODIUM CHLORIDE 0.9 % IV SOLN
INTRAVENOUS | Status: AC
  Administered 2019-01-03: 12:00:00 via INTRAVENOUS

## 2019-01-03 MED ORDER — SODIUM CHLORIDE 0.9 % IV SOLN
510.0000 mg | Freq: Once | INTRAVENOUS | Status: AC
  Administered 2019-01-03: 510 mg via INTRAVENOUS
  Filled 2019-01-03: qty 17

## 2019-01-03 MED ORDER — ONDANSETRON HCL 4 MG PO TABS: 4.0000 mg | ORAL_TABLET | Freq: Four times a day (QID) | ORAL | Status: DC | PRN

## 2019-01-03 MED ORDER — SODIUM CHLORIDE 0.9 % IV BOLUS
1000.0000 mL | Freq: Once | INTRAVENOUS | Status: AC
  Administered 2019-01-03: 1000 mL via INTRAVENOUS

## 2019-01-03 MED ORDER — SERTRALINE HCL 50 MG PO TABS
50.0000 mg | ORAL_TABLET | Freq: Every day | ORAL | Status: DC
  Administered 2019-01-04: 50 mg via ORAL
  Filled 2019-01-03 (×2): qty 1

## 2019-01-03 MED ORDER — ALLOPURINOL 100 MG PO TABS
50.0000 mg | ORAL_TABLET | Freq: Every day | ORAL | Status: DC
  Administered 2019-01-03 – 2019-01-04 (×2): 50 mg via ORAL
  Filled 2019-01-03: qty 1
  Filled 2019-01-03: qty 0.5

## 2019-01-03 MED ORDER — HYDRALAZINE HCL 20 MG/ML IJ SOLN: 10.0000 mg | INTRAMUSCULAR | Status: DC | PRN

## 2019-01-03 MED ORDER — LEVOTHYROXINE SODIUM 100 MCG/5ML IV SOLN
66.0000 ug | Freq: Every day | INTRAVENOUS | Status: DC
  Administered 2019-01-03 – 2019-01-04 (×2): 66 ug via INTRAVENOUS
  Filled 2019-01-03 (×2): qty 5

## 2019-01-03 MED ORDER — SODIUM CHLORIDE 0.9 % IV SOLN
INTRAVENOUS | Status: DC
  Administered 2019-01-03: 09:00:00 via INTRAVENOUS

## 2019-01-03 MED ORDER — COLESTIPOL HCL 1 G PO TABS
2.0000 g | ORAL_TABLET | Freq: Two times a day (BID) | ORAL | Status: DC
  Administered 2019-01-03 – 2019-01-04 (×2): 2 g via ORAL
  Filled 2019-01-03 (×4): qty 2

## 2019-01-03 NOTE — H&P (Addendum)
History and Physical    [name] OBS:[id number] DOB: 02-12-1928 DOA: 01/02/2019  PCP: Jinny Sanders, MD  Patient coming from: Home.  History obtained from patient's daughter.  Chief Complaint: Abnormal labs.  HPI: [name] is a 85 y.o. female with history of abdominal aortic aneurysm status post repair with single functioning kidney, CAD, peripheral vascular disease, chronic kidney disease stage III, hypothyroidism was brought to the ER after patient had routine labs done by primary care physician was found to be markedly abnormal.  Patient's TSH was 199 creatinine has increased to 2.5 from 1.5 3 months ago.  Per patient's daughter who provided most of the history patient's daughter had visited patient's home last week and 4 days ago and at that time noticed that patient has not been taking her medication and was not known how long she has not taken it.  Patient also over the last 1 month has been having increasing cognitive decline weakness but has not had any nausea vomiting diarrhea chest pain or shortness of breath fever chills or productive cough.  Given the patient has not taken her medication daughter called Dr. PCP who did lab work and was calling to come to the ER because of worsening creatinine severely increased TSH and new anemia.  Patient also was found to have frequent dizziness.  A week ago patient did have a fall and scraped her leg onto the car bumper but did not lose consciousness.  As per the patient's daughter this was due to patient sleeping.  ED Course: In the ER repeat labs show hemoglobin of 8.5 which is a drop of 4 g from Oct 15, 2022.  Stool for occult blood was negative.  In addition mild thrombocytopenia was seen.  Creatinine is 2.2 TSH is 199.  EKG shows sinus bradycardia.  Heart rate was around 55 bpm.  Patient was started on IV fluids and admitted for acute renal failure anemia severe hypothyroidism dizziness.  Review of Systems: As per HPI, rest all  negative.   Past Medical History:  Diagnosis Date  . AAA (abdominal aortic aneurysm) (HCC)    Recurrent; s/p repair, now 3.8 cam; h/o ischemic bowel 2004-10-14  . Aortic stenosis    Moderate; Echo 12/09. moderate AS mean 19. AVA 0.88  . Basal cell carcinoma of nose 1980s  . Bruises easily   . CAD (coronary artery disease)    s/p NSTEMI 1/07 with BMS to OM; cath 12/09 due to positive Myoview, LM ok. LAD 40%. LCX 80-90% mid. RCA chronically occluded with R --> L collats  . Carotid artery stenosis    R 60-79% L 0-39% (6/10) - stable  . Cataract   . CRI (chronic renal insufficiency)    Mild, Cr 1.3  . Depression 10-14-2004   With death of spouse  . Full dentures   . GERD (gastroesophageal reflux disease)   . HTN (hypertension)   . Hyperlipidemia    Increased LFTs on Vytorin. Changed to Lipitor.  . Hyperparathyroidism   . Hypothyroidism   . Incontinence   . Myocardial infarction ([place]) 2005-10-14  . PAD (peripheral artery disease) (HCC)    s/p iliac stents  . Renal artery stenosis (HCC)    Atrophic L kidney. Moderate blockage on R  . Ventral hernia    Moderate sized  . Vitamin D deficiency     Past Surgical History:  Procedure Laterality Date  . ABDOMINAL AORTIC ANEURYSM REPAIR  October 14, 1993, 07/01/2005  . ANGIOPLASTY  2003/10/15 - 07/01/2005  .  APPENDECTOMY  1954  . breast biospy  1996  . BREAST SURGERY    . CATARACT EXTRACTION, BILATERAL    . CHOLECYSTECTOMY  07/01/2005  . CHOLECYSTECTOMY  1987  . COLONOSCOPY  07/07/2005   Colonic erosions, ulcers proctitis  . COLONOSCOPY W/ ENDOSCOPIC [place]  07/01/2005   [place] kidney diffuse atrophy  . CORONARY STENT PLACEMENT    . CORONARY STENT PLACEMENT  07/2005   MI   . HERNIA REPAIR  1997  . HOSP  04/2005   ? abd. prob. mesenteric ischemia  . HOSP  01/2006   Hosp for reflux (chest pain)  . ILIAC ARTERY STENT  2005 - 07/01/2005   Stent placement, both legs - PVD in arteries  . NSTEMI  08/13/2005   1/07 s/p stent in circumflex  . TONSILLECTOMY  1943  .  UPPER GASTROINTESTINAL ENDOSCOPY  07/07/2005   Nml     reports that she quit smoking about 13 years ago. She has never used smokeless tobacco. She reports that she does not drink alcohol or use drugs.  Allergies  Allergen Reactions  . Naproxen Nausea And Vomiting  . Tramadol Nausea And Vomiting  . Codeine Nausea And Vomiting and Rash  . Morphine Sulfate Nausea And Vomiting and Rash  . Sulfonamide Derivatives Nausea And Vomiting and Rash    Family History  Problem Relation Age of Onset  . Heart attack Mother        MI  . Cancer Mother   . Heart disease Mother        heart attack  . Stroke Mother   . Cancer Father        throat  . Heart attack Father        MI  . Throat cancer Father   . Heart disease Father        heart attack   . Stroke Father   . Cancer Sister        breast  . Breast cancer Sister   . Hyperlipidemia Sister   . Hyperlipidemia Sister   . Hypertension Sister   . Hyperlipidemia Son   . Colon cancer Neg Hx     Prior to Admission medications   Medication Sig Start Date End Date Taking? Authorizing Provider  acetaminophen (TYLENOL) 500 MG tablet Take 1,000 mg by mouth 2 (two) times daily as needed for pain.   Yes [provider]  allopurinol (ZYLOPRIM) 100 MG tablet Take 0.5 tablets (50 mg total) by mouth daily. 09/26/18  Yes Bedsole, Amy E, MD  aspirin EC 81 MG tablet Take 1 tablet (81 mg total) by mouth daily. 09/27/17  Yes Minna Merritts, MD  atorvastatin (LIPITOR) 80 MG tablet Take 1 tablet (80 mg total) by mouth daily. 09/26/18  Yes Bedsole, Amy E, MD  Cholecalciferol 1000 UNITS capsule Take 3,000 Units by mouth daily.    Yes [provider]  clopidogrel (PLAVIX) 75 MG tablet Take 1 tablet (75 mg total) by mouth daily. 09/26/18  Yes Bedsole, Amy E, MD  colestipol (COLESTID) 1 g tablet Take 2 tablets (2 g total) by mouth 2 (two) times daily. 02/14/18  Yes Bedsole, Amy E, MD  estradiol (ESTRACE VAGINAL) 0.1 MG/GM vaginal cream Place 2 g  vaginally 2 (two) times a week. Thursday and Sunday   Yes [provider]  isosorbide mononitrate (IMDUR) 30 MG 24 hr tablet TAKE 1 TABLET TWICE A DAY Patient taking differently: Take 30 mg by mouth 2 (two) times a day.  11/06/18  Yes Gollan, Kathlene November, MD  levothyroxine (SYNTHROID, LEVOTHROID) 112 MCG tablet Take 1 tablet (112 mcg total) by mouth daily. 09/26/18  Yes Bedsole, Amy E, MD  metoprolol tartrate (LOPRESSOR) 25 MG tablet TAKE ONE-HALF (1/2) TABLET TWICE A DAY Patient taking differently: Take 12.5 mg by mouth 2 (two) times daily.  03/06/18  Yes Minna Merritts, MD  sertraline (ZOLOFT) 50 MG tablet Take 1 tablet (50 mg total) by mouth daily. 09/26/18  Yes Jinny Sanders, MD    Physical Exam: Vitals:   01/03/19 0255 01/03/19 0300 01/03/19 0329 01/03/19 0400  BP:  (!) 166/80 124/69 (!) 169/85  Pulse: (!) 51 (!) 47 (!) 51   Resp: 15 19 19 16   Temp:   (!) 97.2 F (36.2 C)   TempSrc:   Oral   SpO2: 94% 95% 93%   Weight:      Height:          Constitutional: Moderately built and nourished. Vitals:   01/03/19 0255 01/03/19 0300 01/03/19 0329 01/03/19 0400  BP:  (!) 166/80 124/69 (!) 169/85  Pulse: (!) 51 (!) 47 (!) 51   Resp: 15 19 19 16   Temp:   (!) 97.2 F (36.2 C)   TempSrc:   Oral   SpO2: 94% 95% 93%   Weight:      Height:       Eyes: Anicteric no pallor. ENMT: No discharge from the ears eyes nose and mouth. Neck: No mass felt.  No neck rigidity. Respiratory: No rhonchi or crepitations. Cardiovascular: S1-S2 heard. Abdomen: Soft nontender bowel sounds present. Musculoskeletal: No edema.  No joint effusion. Skin: No rash. Neurologic: Alert awake oriented to time place and person moves all extremities. Psychiatric: Patient is hard of hearing otherwise oriented to place and person.   Labs on Admission: I have personally reviewed following labs and imaging studies  CBC: Recent Labs  Lab 01/02/19 1106 01/02/19 2314  WBC 7.9 7.2  NEUTROABS 5.6  --    HGB 8.5 aL* 8.2*  HCT 26.2 aL* 27.0*  MCV 76.8* 80.8  PLT 132.0* 720*   Basic Metabolic Panel: Recent Labs  Lab 01/02/19 1106 01/02/19 2314  NA 137 140  K 4.4 4.1  CL 100 105  CO2 28 26  GLUCOSE 83 96  BUN 31* 34*  CREATININE 2.29* 2.57*  CALCIUM 9.2 9.6   GFR: Estimated Creatinine Clearance: 16.2 mL/min (A) (by C-G formula based on SCr of 2.57 mg/dL (H)). Liver Function Tests: Recent Labs  Lab 01/02/19 1106  AST 97*  ALT 53*  ALKPHOS 40  BILITOT 0.9  PROT 6.6  ALBUMIN 4.1   No results for input(s): LIPASE, AMYLASE in the last 168 hours. No results for input(s): AMMONIA in the last 168 hours. Coagulation Profile: No results for input(s): INR, PROTIME in the last 168 hours. Cardiac Enzymes: No results for input(s): CKTOTAL, CKMB, CKMBINDEX, TROPONINI in the last 168 hours. BNP (last 3 results) No results for input(s): PROBNP in the last 8760 hours. HbA1C: No results for input(s): HGBA1C in the last 72 hours. CBG: Recent Labs  Lab 01/03/19 0251  GLUCAP 80   Lipid Profile: No results for input(s): CHOL, HDL, LDLCALC, TRIG, CHOLHDL, LDLDIRECT in the last 72 hours. Thyroid Function Tests: Recent Labs    01/02/19 2314 01/03/19 0159  TSH 199.528*  --   FREET4  --  <0.25*   Anemia Panel: No results for input(s): VITAMINB12, FOLATE, FERRITIN, TIBC, IRON, RETICCTPCT in the last 72  hours. Urine analysis:    Component Value Date/Time   COLORURINE YELLOW 01/02/2019 2301   APPEARANCEUR CLEAR 01/02/2019 2301   LABSPEC 1.015 01/02/2019 2301   PHURINE 6.0 01/02/2019 2301   GLUCOSEU NEGATIVE 01/02/2019 2301   HGBUR MODERATE (A) 01/02/2019 2301   HGBUR moderate 12/31/2008 1355   BILIRUBINUR NEGATIVE 01/02/2019 2301   BILIRUBINUR Negative 01/02/2019 1014   KETONESUR NEGATIVE 01/02/2019 2301   PROTEINUR 100 (A) 01/02/2019 2301   UROBILINOGEN 1.0 01/02/2019 1014   UROBILINOGEN 1.0 01/04/2009 0656   NITRITE NEGATIVE 01/02/2019 2301   LEUKOCYTESUR NEGATIVE  01/02/2019 2301   Sepsis Labs: @LABRCNTIP (procalcitonin:4,lacticidven:4) )No results found for this or any previous visit (from the past 240 hour(s)).   Radiological Exams on Admission: No results found.  EKG: Independently reviewed.  Sinus bradycardia heart rate around 55 bpm.  Assessment/Plan Principal Problem:   ARF (acute renal failure) (HCC) Active Problems:   Hypothyroidism   Essential hypertension   Aortic valve stenosis   Abdominal aortic aneurysm (HCC)   PAD (peripheral artery disease) (HCC)   Gout   Anemia    1. Acute renal failure with single functioning kidney-cause not clear check FENa and if creatinine does not improve with IV fluids may check renal ultrasound.  Closely follow intake output. 2. Severe hypothyroidism secondary to likely from noncompliant with medication.  Patient is to take Synthroid 112 mcg which I have dosed IV for now.  No signs of any myxedema coma. 3. Normocytic normochromic anemia stool for occult blood was negative.  Check anemia panel recheck CBC transfuse if hemoglobin less than 7.  Will need GI work-up eventually. 4. Dizziness and weakness with cognitive decline -could be from severe hypothyroidism.  Check anemia panel for specifically for B12 folate MRI brain is pending get physical therapy consult.  Patient is also bradycardic which could be contributing to the symptoms. 5. Hypertension -since patient has significant bradycardia will hold metoprolol actively we give Synthroid and monitor in telemetry to make sure the heart rate is coming up at that point may reconsider starting metoprolol.  Continue Imdur PRN IV hydralazine for now. 6. Peripheral vascular disease on statins antiplatelet agents. 7. History of gout on allopurinol which may need to be held if creatinine does not improve. 8. Thrombocytopenia -has had thrombocytopenia previously.  Follow CBC.   DVT prophylaxis: Lovenox. Code Status: DNR as confirmed with patient's daughter.  Family Communication: Patient's daughter. Disposition Plan: To be determined. Consults called: None. Admission status: Observation.   Rise Patience MD [facility] Pager [phone].  If 7PM-7AM, please contact night-coverage [email or web address] Password [facility]  01/03/2019, 4:50 AM

## 2019-01-03 NOTE — Progress Notes (Signed)
Patient arrived to the floor via stretcher. Telemetry placed. Assessment and history preformed. Iv started and currently infusing. Low bed in use. Will continue to monitor.

## 2019-01-03 NOTE — Progress Notes (Signed)
Patient admitted after midnight. See H&P. In brief 85 yo hx AAA s/p repair, CAD, single functioning kidney, CKD III hypothyroidism presents with abnormal labs. Saw PCP for generalized weakness and found TSH 199 and acute on chronic kidney disease and iron def anemia. Likely related to early dementia and inability to manage medications.    PE Gen: awake alert no acute distress CV: rrr no mgr no LE edema Resp: normal effort BS clear bilaterally no wheeze Abd: soft +BS no guarding Neuro: alert oriented to self only, follows commands, speech clear. Moving all extremeties  A/P  1. Acute renal failure with single functioning kidney-cause not clear check FENa and if creatinine does not improve with IV fluids may check renal ultrasound.  Closely follow intake output. 2. Severe hypothyroidism secondary to likely from noncompliant with medication.  Patient is to take Synthroid 112 mcg. Likely unable to manage meds anymore.  No signs of any myxedema coma. Continue IV synthroid 3. Normocytic normochromic anemia stool for occult blood was negative. Iron studies with saturation ratio 7. Will give IV iron. monitor 4. Dizziness and weakness with cognitive decline -could be from severe hypothyroidism as well as anemia. Folate 57.  Patient is also bradycardic which could be contributing to the symptoms. 5. Hypertension -since patient has significant bradycardia will hold metoprolol actively we give Synthroid and monitor in telemetry to make sure the heart rate is coming up at that point may reconsider starting metoprolol.  Continue Imdur PRN IV hydralazine for now. 6. Peripheral vascular disease on statins antiplatelet agents. 7. History of gout on allopurinol which may need to be held if creatinine does not improve. 8. Thrombocytopenia -has had thrombocytopenia previously.  Follow CBC.   Radene Gunning, NP

## 2019-01-03 NOTE — Evaluation (Signed)
Physical Therapy Evaluation Patient Details Name: [name] MRN: [id number] DOB: 1928-03-14 Today's Date: 01/03/2019   History of Present Illness  Pt is an 85 y/o female admitted secondary to abnormal lab values and ARF secondary to not taking necessary medications. Pt thought to have possible early dementia that has led to inability of managing medications. PMH includes HTN, PAD, AAA s/p repair, CAD, and CKD.   Clinical Impression  Pt admitted secondary to problem above with deficits below. Pt requiring min guard A for mobility tasks this session. Pt's daughter present and reports pt with notable new cognitive deficits and has been forgetting to take her medications as appropriate. Reports plan is to return home with daughter at d/c, so daughter can provide 24/7 assist. Will continue to follow acutely to maximize functional mobility independence and safety.     Follow Up Recommendations Home health PT;Supervision/Assistance - 24 hour    Equipment Recommendations  None recommended by PT    Recommendations for Other Services       Precautions / Restrictions Precautions Precautions: Fall Restrictions Weight Bearing Restrictions: No      Mobility  Bed Mobility Overal bed mobility: Needs Assistance Bed Mobility: Supine to Sit;Sit to Supine     Supine to sit: Supervision Sit to supine: Min assist   General bed mobility comments: Supervision for safety to come to sitting at edge of stretcher. Min A for LE assist to return to supine.   Transfers Overall transfer level: Needs assistance Equipment used: 1 person hand held assist Transfers: Sit to/from Stand Sit to Stand: Min guard         General transfer comment: Min guard for steadying assist.   Ambulation/Gait Ambulation/Gait assistance: Min guard Gait Distance (Feet): 15 Feet Assistive device: 1 person hand held assist Gait Pattern/deviations: Step-through pattern;Decreased stride length Gait velocity: Decreased     General Gait Details: Pt connected to monitors in ED room, therefore mobility limited. Performed forwards and backwards walking at EOB. Pt with mild unsteadiness, however, no LOB noted.   Stairs            Wheelchair Mobility    Modified Rankin (Stroke Patients Only)       Balance Overall balance assessment: Needs assistance Sitting-balance support: No upper extremity supported;Feet supported Sitting balance-Leahy Scale: Good     Standing balance support: No upper extremity supported;Single extremity supported;During functional activity Standing balance-Leahy Scale: Fair Standing balance comment: Able to maintain static standing without UE support                              Pertinent Vitals/Pain Pain Assessment: No/denies pain    Home Living Family/patient expects to be discharged to:: Private residence Living Arrangements: Alone Available Help at Discharge: Family;Available 24 hours/day Type of Home: House Home Access: Stairs to enter Entrance Stairs-Rails: Right;Left;Can reach both Entrance Stairs-Number of Steps: 3 Home Layout: One level Home Equipment: None Additional Comments: Pt's daughter reports plan is for pt to return home with daughter secondary to new cognitive deficits.     Prior Function Level of Independence: Independent               Hand Dominance        Extremity/Trunk Assessment   Upper Extremity Assessment Upper Extremity Assessment: Overall WFL for tasks assessed    Lower Extremity Assessment Lower Extremity Assessment: Generalized weakness    Cervical / Trunk Assessment Cervical / Trunk Assessment: Normal  Communication   Communication: No difficulties  Cognition Arousal/Alertness: Awake/alert Behavior During Therapy: WFL for tasks assessed/performed Overall Cognitive Status: Impaired/Different from baseline Area of Impairment: Orientation;Memory;Problem solving                 Orientation Level:  Disoriented to;Place;Situation   Memory: Decreased short-term memory;Decreased recall of precautions       Problem Solving: Slow processing General Comments: Pt reporting she was in [place] and required reorientation that she was in [place].       General Comments General comments (skin integrity, edema, etc.): Educated pt's daughter about need for 24/7 support and pt in agreement.     Exercises     Assessment/Plan    PT Assessment Patient needs continued PT services  PT Problem List Decreased strength;Decreased balance;Decreased mobility;Decreased cognition;Decreased safety awareness;Decreased knowledge of precautions       PT Treatment Interventions Gait training;Stair training;Functional mobility training;Therapeutic activities;Therapeutic exercise;Balance training;Patient/family education    PT Goals (Current goals can be found in the Care Plan section)  Acute Rehab PT Goals Patient Stated Goal: to go home with daughter PT Goal Formulation: With patient/family Time For Goal Achievement: 01/17/19 Potential to Achieve Goals: Good    Frequency Min 3X/week   Barriers to discharge        Co-evaluation               AM-PAC PT "6 Clicks" Mobility  Outcome Measure Help needed turning from your back to your side while in a flat bed without using bedrails?: A Little Help needed moving from lying on your back to sitting on the side of a flat bed without using bedrails?: A Little Help needed moving to and from a bed to a chair (including a wheelchair)?: A Little Help needed standing up from a chair using your arms (e.g., wheelchair or bedside chair)?: A Little Help needed to walk in hospital room?: A Little Help needed climbing 3-5 steps with a railing? : A Lot 6 Click Score: 17    End of Session Equipment Utilized During Treatment: Gait belt Activity Tolerance: Patient tolerated treatment well Patient left: in bed;with call bell/phone within reach;with  family/visitor present;Other (comment)(MD present ) Nurse Communication: Mobility status PT Visit Diagnosis: Other abnormalities of gait and mobility (R26.89);Muscle weakness (generalized) (M62.81)    Time: 1050-1107 PT Time Calculation (min) (ACUTE ONLY): 17 min   Charges:   PT Evaluation $PT Eval Low Complexity: [place], PT, DPT  Acute Rehabilitation Services  Pager: [phone] Office: [phone]   Rudean Hitt 01/03/2019, 3:17 PM

## 2019-01-03 NOTE — Plan of Care (Signed)

## 2019-01-03 NOTE — ED Provider Notes (Signed)
[facility] EMERGENCY DEPARTMENT Provider Note   CSN: [id number] Arrival date & time: 01/02/19  10-24-2212     History   Chief Complaint Chief Complaint  Patient presents with  . Abnormal Lab    HPI [name] is a 85 y.o. female.     HPI  This is an 85 year old female with a history of coronary artery disease, hypertension, hyperlipidemia, hypothyroidism who presents with abnormal lab work.  Daughter reports that she has noted that her mother has seemed a bit more confused lately.  She stopped taking her medication.  She was seen by her primary physician this morning and noted to have multiple abnormal labs.  She was referred here for admission.  Patient does not have any physical complaints at this time.  She denies any pain.  She does report occasional dizziness.  Denies chest pain, shortness of breath, fevers, cough, nausea, vomiting, abdominal pain.  Daughter reports that she has not been taking multiple of her medications.  Past Medical History:  Diagnosis Date  . AAA (abdominal aortic aneurysm) (HCC)    Recurrent; s/p repair, now 3.8 cam; h/o ischemic bowel 10/24/2004  . Aortic stenosis    Moderate; Echo 12/09. moderate AS mean 19. AVA 0.88  . Basal cell carcinoma of nose 1980s  . Bruises easily   . CAD (coronary artery disease)    s/p NSTEMI 1/07 with BMS to OM; cath 12/09 due to positive Myoview, LM ok. LAD 40%. LCX 80-90% mid. RCA chronically occluded with R --> L collats  . Carotid artery stenosis    R 60-79% L 0-39% (6/10) - stable  . Cataract   . CRI (chronic renal insufficiency)    Mild, Cr 1.3  . Depression 10/24/2004   With death of spouse  . Full dentures   . GERD (gastroesophageal reflux disease)   . HTN (hypertension)   . Hyperlipidemia    Increased LFTs on Vytorin. Changed to Lipitor.  . Hyperparathyroidism   . Hypothyroidism   . Incontinence   . Myocardial infarction ([place]) 10/24/2005  . PAD (peripheral artery disease) (HCC)    s/p iliac stents   . Renal artery stenosis (HCC)    Atrophic L kidney. Moderate blockage on R  . Ventral hernia    Moderate sized  . Vitamin D deficiency     Patient Active Problem List   Diagnosis Date Noted  . Pyelonephritis 09/26/2018  . Osteoporosis 11/15/2017  . GAD (generalized anxiety disorder) 05/24/2017  . Major depressive disorder, recurrent episode, moderate ([place]) 05/25/2016  . Counseling regarding end of life decision making 12/13/2014  . Descending thoracic aortic aneurysm ([place]) 05/30/2014  . Vitamin D deficiency 12/26/2012  . Uterine prolapse 06/23/2012  . Gout 06/06/2012  . INSOMNIA, CHRONIC, MILD 07/10/2010  . Carotid stenosis 11/01/2008  . Aortic valve stenosis 09/25/2008  . Abdominal aortic aneurysm ([place]) 09/24/2008  . LOW BACK PAIN, MILD 07/24/2008  . Coronary atherosclerosis 06/21/2008  . VENTRAL HERNIA 05/16/2008  . PULMONARY NODULE, RIGHT MIDDLE LOBE 02/01/2008  . CYSTOCELE WITH INCOMPLETE UTERINE PROLAPSE 02/20/2007  . Hypothyroidism 02/17/2007  . Hyperlipidemia 02/17/2007  . Essential hypertension 02/17/2007  . PAD (peripheral artery disease) ([place]) 02/17/2007  . GERD 02/17/2007  . CKD (chronic kidney disease) stage 3, GFR 30-59 ml/min (HCC) 02/17/2007  . ISCHEMIC COLITIS 05/19/2005    Past Surgical History:  Procedure Laterality Date  . ABDOMINAL AORTIC ANEURYSM REPAIR  October 24, 1993, 07/01/2005  . ANGIOPLASTY  10-25-03 - 07/01/2005  . APPENDECTOMY  1954  .  breast biospy  1996  . BREAST SURGERY    . CATARACT EXTRACTION, BILATERAL    . CHOLECYSTECTOMY  07/01/2005  . CHOLECYSTECTOMY  1987  . COLONOSCOPY  07/07/2005   Colonic erosions, ulcers proctitis  . COLONOSCOPY W/ ENDOSCOPIC [place]  07/01/2005   [place] kidney diffuse atrophy  . CORONARY STENT PLACEMENT    . CORONARY STENT PLACEMENT  07/2005   MI   . HERNIA REPAIR  1997  . HOSP  04/2005   ? abd. prob. mesenteric ischemia  . HOSP  01/2006   Hosp for reflux (chest pain)  . ILIAC ARTERY STENT  2005 - 07/01/2005   Stent  placement, both legs - PVD in arteries  . NSTEMI  08/13/2005   1/07 s/p stent in circumflex  . TONSILLECTOMY  1943  . UPPER GASTROINTESTINAL ENDOSCOPY  07/07/2005   Nml     OB History   No obstetric history on file.      Home Medications    Prior to Admission medications   Medication Sig Start Date End Date Taking? Authorizing Provider  acetaminophen (TYLENOL) 500 MG tablet Take 1,000 mg by mouth 2 (two) times daily as needed for pain.   Yes [provider]  allopurinol (ZYLOPRIM) 100 MG tablet Take 0.5 tablets (50 mg total) by mouth daily. 09/26/18  Yes Bedsole, Amy E, MD  aspirin EC 81 MG tablet Take 1 tablet (81 mg total) by mouth daily. 09/27/17  Yes Minna Merritts, MD  atorvastatin (LIPITOR) 80 MG tablet Take 1 tablet (80 mg total) by mouth daily. 09/26/18  Yes Bedsole, Amy E, MD  Cholecalciferol 1000 UNITS capsule Take 3,000 Units by mouth daily.    Yes [provider]  clopidogrel (PLAVIX) 75 MG tablet Take 1 tablet (75 mg total) by mouth daily. 09/26/18  Yes Bedsole, Amy E, MD  colestipol (COLESTID) 1 g tablet Take 2 tablets (2 g total) by mouth 2 (two) times daily. 02/14/18  Yes Bedsole, Amy E, MD  estradiol (ESTRACE VAGINAL) 0.1 MG/GM vaginal cream Place 2 g vaginally 2 (two) times a week. Thursday and Sunday   Yes [provider]  isosorbide mononitrate (IMDUR) 30 MG 24 hr tablet TAKE 1 TABLET TWICE A DAY Patient taking differently: Take 30 mg by mouth 2 (two) times a day.  11/06/18  Yes Gollan, Kathlene November, MD  levothyroxine (SYNTHROID, LEVOTHROID) 112 MCG tablet Take 1 tablet (112 mcg total) by mouth daily. 09/26/18  Yes Bedsole, Amy E, MD  metoprolol tartrate (LOPRESSOR) 25 MG tablet TAKE ONE-HALF (1/2) TABLET TWICE A DAY Patient taking differently: Take 12.5 mg by mouth 2 (two) times daily.  03/06/18  Yes Minna Merritts, MD  sertraline (ZOLOFT) 50 MG tablet Take 1 tablet (50 mg total) by mouth daily. 09/26/18  Yes Jinny Sanders, MD    Family  History Family History  Problem Relation Age of Onset  . Heart attack Mother        MI  . Cancer Mother   . Heart disease Mother        heart attack  . Stroke Mother   . Cancer Father        throat  . Heart attack Father        MI  . Throat cancer Father   . Heart disease Father        heart attack   . Stroke Father   . Cancer Sister        breast  . Breast  cancer Sister   . Hyperlipidemia Sister   . Hyperlipidemia Sister   . Hypertension Sister   . Hyperlipidemia Son   . Colon cancer Neg Hx     Social History Social History   Tobacco Use  . Smoking status: Former Smoker    Quit date: 07/12/2005    Years since quitting: 13.4  . Smokeless tobacco: Never Used  . Tobacco comment: quit in 1997  Substance Use Topics  . Alcohol use: No  . Drug use: No     Allergies   Naproxen, Tramadol, Codeine, Morphine sulfate, and Sulfonamide derivatives   Review of Systems Review of Systems  Constitutional: Negative for fever.  Respiratory: Negative for shortness of breath.   Cardiovascular: Negative for chest pain.  Gastrointestinal: Negative for abdominal pain, nausea and vomiting.  Genitourinary: Negative for dysuria.  Musculoskeletal: Negative for back pain.  Skin: Positive for wound.  Neurological: Positive for dizziness. Negative for weakness and numbness.  All other systems reviewed and are negative.    Physical Exam Updated Vital Signs BP (!) 147/88   Pulse (!) 51   Temp 98.5 F (36.9 C) (Oral)   Resp 15   Ht 1.676 m (5\' 6" )   Wt 84.4 kg   SpO2 94%   BMI 30.02 kg/m   Physical Exam Vitals signs and nursing note reviewed.  Constitutional:      Appearance: She is well-developed. She is not ill-appearing.     Comments: Hard of hearing  HENT:     Head: Normocephalic and atraumatic.     Mouth/Throat:     Mouth: Mucous membranes are moist.  Eyes:     Pupils: Pupils are equal, round, and reactive to light.  Neck:     Musculoskeletal: Neck supple.   Cardiovascular:     Rate and Rhythm: Normal rate and regular rhythm.     Heart sounds: Murmur present.  Pulmonary:     Effort: Pulmonary effort is normal. No respiratory distress.     Breath sounds: No wheezing.  Abdominal:     General: Bowel sounds are normal.     Palpations: Abdomen is soft.     Tenderness: There is no abdominal tenderness.     Hernia: A hernia is present.  Genitourinary:    Rectum: Guaiac result negative.  Musculoskeletal:     Right lower leg: Edema present.     Left lower leg: Edema present.  Skin:    General: Skin is warm and dry.     Comments: Extensive ecchymosis and swelling predominantly over right ankle and lower leg  Neurological:     Mental Status: She is alert and oriented to person, place, and time.     Comments: 5 out of 5 strength in all 4 extremities, no dysmetria to finger-nose-finger, no drift, fluent speech, no facial droop noted  Psychiatric:        Mood and Affect: Mood normal.      ED Treatments / Results  Labs (all labs ordered are listed, but only abnormal results are displayed) Labs Reviewed  BASIC METABOLIC PANEL - Abnormal; Notable for the following components:      Result Value   BUN 34 (*)    Creatinine, Ser 2.57 (*)    GFR calc non Af Amer 16 (*)    GFR calc Af Amer 18 (*)    All other components within normal limits  CBC - Abnormal; Notable for the following components:   RBC 3.34 (*)    Hemoglobin 8.2 (*)  HCT 27.0 (*)    MCH 24.6 (*)    RDW 20.0 (*)    Platelets 132 (*)    All other components within normal limits  URINALYSIS, ROUTINE W REFLEX MICROSCOPIC - Abnormal; Notable for the following components:   Hgb urine dipstick MODERATE (*)    Protein, ur 100 (*)    Bacteria, UA RARE (*)    All other components within normal limits  TSH - Abnormal; Notable for the following components:   TSH 199.528 (*)    All other components within normal limits  NOVEL CORONAVIRUS, NAA (HOSPITAL ORDER, SEND-OUT TO REF LAB)   T4, FREE  CBG MONITORING, ED  POC OCCULT BLOOD, ED    EKG EKG Interpretation  Date/Time:  Tuesday January 02 2019 23:17:29 EDT Ventricular Rate:  55 PR Interval:  118 QRS Duration: 84 QT Interval:  448 QTC Calculation: 428 R Axis:   32 Text Interpretation:  Sinus bradycardia Nonspecific ST and T wave abnormality Abnormal ECG No significant change since last tracing Confirmed by Merrily Pew [phone]) on 01/03/2019 12:13:41 AM   Radiology No results found.  Procedures Procedures (including critical care time)  Medications Ordered in ED Medications  sodium chloride 0.9 % bolus 1,000 mL (1,000 mLs Intravenous New Bag/Given 01/03/19 0255)     Initial Impression / Assessment and Plan / ED Course  I have reviewed the triage vital signs and the nursing notes.  Pertinent labs & imaging results that were available during my care of the patient were reviewed by me and considered in my medical decision making (see chart for details).        Patient presents with abnormal lab work.  Recent history of confusion and not taking her medications.  Lab work notable for acute kidney injury with creatinine approximately doubled baseline.  Additionally, TSH is extremely elevated likely reflective of not taking her thyroid medication.  She also has a new anemia with a hemoglobin of 8 down from 12.  Denies any active bleeding.  She is Hemoccult negative on exam.  She has a bruising on the right lower extremity reports trauma.  Patient not orthostatic.  She given fluids for dizziness and AKI.  Nonfocal on exam, do not feel she needs imaging.  She otherwise denies any infectious symptoms.  We will plan for admission to the hospital for hydration and medication reconciliation.    Final Clinical Impressions(s) / ED Diagnoses   Final diagnoses:  AKI (acute kidney injury) ([place])  Anemia, unspecified type  Hypothyroidism, unspecified type    ED Discharge Orders    None       Merryl Hacker,  MD 01/03/19 [phone]

## 2019-01-03 NOTE — ED Notes (Signed)
Patient going to MRI

## 2019-01-03 NOTE — Progress Notes (Signed)
Called and updated patient's family on plan of care. Answered all questions.

## 2019-01-03 NOTE — ED Notes (Signed)
Tele   Breakfast ordered  

## 2019-01-03 NOTE — ED Notes (Signed)
Patient transported to MRI 

## 2019-01-04 DIAGNOSIS — I10 Essential (primary) hypertension: Secondary | ICD-10-CM | POA: Diagnosis not present

## 2019-01-04 DIAGNOSIS — N179 Acute kidney failure, unspecified: Secondary | ICD-10-CM

## 2019-01-04 DIAGNOSIS — E038 Other specified hypothyroidism: Secondary | ICD-10-CM | POA: Diagnosis not present

## 2019-01-04 DIAGNOSIS — D649 Anemia, unspecified: Secondary | ICD-10-CM | POA: Diagnosis not present

## 2019-01-04 LAB — BASIC METABOLIC PANEL
Anion gap: 10 (ref 5–15)
BUN: 25 mg/dL — ABNORMAL HIGH (ref 8–23)
CO2: 22 mmol/L (ref 22–32)
Calcium: 8.8 mg/dL — ABNORMAL LOW (ref 8.9–10.3)
Chloride: 107 mmol/L (ref 98–111)
Creatinine, Ser: 1.92 mg/dL — ABNORMAL HIGH (ref 0.44–1.00)
GFR calc Af Amer: 26 mL/min — ABNORMAL LOW (ref >60)
GFR calc non Af Amer: 23 mL/min — ABNORMAL LOW (ref >60)
Glucose, Bld: 83 mg/dL (ref 70–99)
Potassium: 3.8 mmol/L (ref 3.5–5.1)
Sodium: 139 mmol/L (ref 135–145)

## 2019-01-04 LAB — CBC
HCT: 25.6 % — ABNORMAL LOW (ref 36.0–46.0)
Hemoglobin: 8 g/dL — ABNORMAL LOW (ref 12.0–15.0)
MCH: 24.8 pg — ABNORMAL LOW (ref 26.0–34.0)
MCHC: 31.3 g/dL (ref 30.0–36.0)
MCV: 79.3 fL — ABNORMAL LOW (ref 80.0–100.0)
Platelets: 116 10*3/uL — ABNORMAL LOW (ref 150–400)
RBC: 3.23 MIL/uL — ABNORMAL LOW (ref 3.87–5.11)
RDW: 19.9 % — ABNORMAL HIGH (ref 11.5–15.5)
WBC: 6.3 10*3/uL (ref 4.0–10.5)
nRBC: 0 % (ref 0.0–0.2)

## 2019-01-04 LAB — NOVEL CORONAVIRUS, NAA (HOSP ORDER, SEND-OUT TO REF LAB; TAT 18-24 HRS): SARS-CoV-2, NAA: NOT DETECTED

## 2019-01-04 NOTE — Progress Notes (Signed)
DISCHARGE NOTE HOME ZIYANNA TOLIN to be discharged Home per MD order. Discussed prescriptions and follow up appointments with the patient. Prescriptions given to patient; medication list explained in detail. Patient verbalized understanding.  Skin clean, dry and intact without evidence of skin break down, no evidence of skin tears noted. IV catheter discontinued intact. Site without signs and symptoms of complications. Dressing and pressure applied. Pt denies pain at the site currently. No complaints noted.  Patient free of lines, drains, and wounds.   An After Visit Summary (AVS) was printed and given to the patient. Patient escorted via wheelchair, and discharged home via private auto.  Aneta Mins BSN, RN3

## 2019-01-04 NOTE — Care Management Obs Status (Signed)
MEDICARE OBSERVATION STATUS NOTIFICATION   Patient Details  Name: [name] MRN: [id number] Date of Birth: 25-Nov-1927   Medicare Observation Status Notification Given:  Yes  CM spoke and explained the form to the patients daughter, [name], d/t the patients cognitive issues, with verbal permission provided for a verbal signature by the [facility].   Midge Minium RN, BSN, NCM-BC, ACM-RN [phone] 01/04/2019, 11:28 AM

## 2019-01-04 NOTE — Discharge Summary (Signed)
Physician Discharge Summary  [name] FAO:[id number] DOB: 1928/03/29 DOA: 01/02/2019  PCP: Becky Sanders, MD  Admit date: 01/02/2019 Discharge date: 01/04/2019  Time spent: 45 minutes  Recommendations for Outpatient Follow-up:  1. Follow up with PCP 1-2 weeks for evaluation of symptoms. Recommend bmet to track kidney function 2. Follow up with PCP 6 weeks for TSH level 3. Home health PT   Discharge Diagnoses:  Principal Problem:   ARF (acute renal failure) (HCC) Active Problems:   Hypothyroidism   Essential hypertension   Aortic valve stenosis   Abdominal aortic aneurysm (HCC)   PAD (peripheral artery disease) ([place])   Gout   Anemia   Discharge Condition: stable  Diet recommendation: heart healthy  Filed Weights   01/02/19 2247 01/03/19 2102  Weight: 84.4 kg 84.3 kg    History of present illness:  [name] is a 85 y.o. female with history of abdominal aortic aneurysm status post repair with single functioning kidney, CAD, peripheral vascular disease, chronic kidney disease stage III, hypothyroidism was brought to the ER 6/24 after patient had routine labs done by primary care physician was found to be markedly abnormal.  Patient's TSH was 199 creatinine has increased to 2.5 from 1.5 3 months prior.  Per patient's daughter who provided most of the history patient's daughter had visited patient's home last week and 4 days prior and at that time noticed that patient had not been taking her medication and was not known how long she has not taken it.  Patient also over the last 1 month having increasing cognitive decline weakness but has not had any nausea vomiting diarrhea chest pain or shortness of breath fever chills or productive cough.  Given the patient had not taken her medication daughter called Dr. PCP who did lab work and was calling to come to the ER because of worsening creatinine severely increased TSH and new anemia.  Patient also was found to have frequent  dizziness.  A week prior patient did have a fall and scraped her leg onto the car bumper but did not lose consciousness.    Hospital Course:   1. Acute renal failure with single functioning kidney-cause not clear. At discharge creatinine 1.9 and baseline 1.5. 2. Severe hypothyroidism  likely from noncompliant with medication. Patient is to take Synthroid 112 mcg. Likely unable to manage meds anymore due to early congnitive issues. No signs of any myxedema coma. she recieved IV synthroid. Recommend recheck TSH 6 weeks. Daughter to take over medication administration 3. Normocytic normochromic anemia stool for occult blood was negative. Iron studies with saturation ratio 7. Provided with  IV iron. Follow up PCP 6 weeks 4. Dizziness and weakness with cognitive decline-could be from severe hypothyroidism as well as anemia. Folate 57. resolved at discharge. Evaluated by PT who recommend [place] [facility] 5. Hypertension-Initially with  Bradycardia. Metoprolol held. HR 75 at discharge. Resumed home med. 6. Peripheral vascular disease on statins antiplatelet agents. 7. History of gout on allopurinol which may need to be held if creatinine does not improve. 8. Thrombocytopenia-has had thrombocytopenia previously. Platelet 116. OP follow up.  Procedures:  Consultations:    Discharge Exam: Vitals:   01/04/19 0456 01/04/19 0951  BP: (!) 130/95 (!) 142/87  Pulse: 66 71  Resp: 15 18  Temp: 97.7 F (36.5 C) 98 F (36.7 C)  SpO2: 92% 93%    General: sitting up in bed eating oatmeal. Smiling no acute distress Cardiovascular: rrr no mgr no LE edema Respiratory:  normal effort BS clear bilaterally  Discharge Instructions   Discharge Instructions    Call MD for:  difficulty breathing, headache or visual disturbances   Complete by: As directed    Call MD for:  extreme fatigue   Complete by: As directed    Call MD for:  persistant nausea and vomiting   Complete by: As directed    Diet - low sodium  heart healthy   Complete by: As directed    Discharge instructions   Complete by: As directed    Take medications as prescribed Follow up with PCP 1-2 weeks for evaluation of symptoms. Recommend BMET .  Follow up with PCP 6 weeks for TSH level   Increase activity slowly   Complete by: As directed      Allergies as of 01/04/2019      Reactions   Naproxen Nausea And Vomiting   Tramadol Nausea And Vomiting   Codeine Nausea And Vomiting, Rash   Morphine Sulfate Nausea And Vomiting, Rash   Sulfonamide Derivatives Nausea And Vomiting, Rash      Medication List    TAKE these medications   acetaminophen 500 MG tablet Commonly known as: TYLENOL Take 1,000 mg by mouth 2 (two) times daily as needed for pain.   allopurinol 100 MG tablet Commonly known as: ZYLOPRIM Take 0.5 tablets (50 mg total) by mouth daily.   aspirin EC 81 MG tablet Take 1 tablet (81 mg total) by mouth daily.   atorvastatin 80 MG tablet Commonly known as: LIPITOR Take 1 tablet (80 mg total) by mouth daily.   Cholecalciferol 25 MCG (1000 UT) capsule Take 3,000 Units by mouth daily.   clopidogrel 75 MG tablet Commonly known as: PLAVIX Take 1 tablet (75 mg total) by mouth daily.   colestipol 1 g tablet Commonly known as: COLESTID Take 2 tablets (2 g total) by mouth 2 (two) times daily.   ESTRACE VAGINAL 0.1 MG/GM vaginal cream Generic drug: estradiol Place 2 g vaginally 2 (two) times a week. Thursday and Sunday   isosorbide mononitrate 30 MG 24 hr tablet Commonly known as: IMDUR TAKE 1 TABLET TWICE A DAY What changed: when to take this   levothyroxine 112 MCG tablet Commonly known as: SYNTHROID Take 1 tablet (112 mcg total) by mouth daily.   metoprolol tartrate 25 MG tablet Commonly known as: LOPRESSOR TAKE ONE-HALF (1/2) TABLET TWICE A DAY What changed:   how much to take  how to take this  when to take this  additional instructions   sertraline 50 MG tablet Commonly known as:  ZOLOFT Take 1 tablet (50 mg total) by mouth daily.      Allergies  Allergen Reactions  . Naproxen Nausea And Vomiting  . Tramadol Nausea And Vomiting  . Codeine Nausea And Vomiting and Rash  . Morphine Sulfate Nausea And Vomiting and Rash  . Sulfonamide Derivatives Nausea And Vomiting and Rash      The results of significant diagnostics from this hospitalization (including imaging, microbiology, ancillary and laboratory) are listed below for reference.    Significant Diagnostic Studies: Mr Brain Wo Contrast  Result Date: 01/03/2019 CLINICAL DATA:  Unexplained altered level of consciousness. Increasing cognitive decline with weakness. EXAM: MRI HEAD WITHOUT CONTRAST TECHNIQUE: Multiplanar, multiecho pulse sequences of the brain and surrounding structures were obtained without intravenous contrast. COMPARISON:  Multiple prior CT studies. FINDINGS: Brain: No acute infarction, hemorrhage, hydrocephalus, extra-axial collection or mass lesion. Advanced atrophy. T2 and FLAIR hyperintensities in the white matter, likely  small vessel disease. Marked susceptibility both gyriform as well as punctate, affect the subarachnoid spaces and superficial aspects of both posterior inferior temporal lobes, greater on the LEFT, consistent with chronic siderosis. No abnormal FLAIR signal in the subarachnoid spaces or layering intraventricular blood. Remote trauma is likely. Other causes, such as amyloid or occult vascular malformation, are less favored. Vascular: Flow voids are maintained. Skull and upper cervical spine: Calvarium and skull base intact. Sinuses/Orbits: Paranasal sinuses are clear.  Negative orbits. Other: None. IMPRESSION: Atrophy and small vessel disease.  No evidence of acute stroke. Widespread siderosis affects the subarachnoid spaces and superficial aspect of both posterior inferior temporal lobes. No CT correlate. Remote trauma is likely. Electronically Signed   By: Staci Righter M.D.   On:  01/03/2019 08:44    Microbiology: Recent Results (from the past 240 hour(s))  Urine Culture     Status: None   Collection Time: 01/02/19 10:32 AM   Specimen: Urine  Result Value Ref Range Status   MICRO NUMBER: [id number]  Final   SPECIMEN QUALITY: Adequate  Final   Sample Source NOT GIVEN  Final   STATUS: FINAL  Final   Result:   Final    Single organism less than 10,000 CFU/mL isolated. These organisms, commonly found on external and internal genitalia, are considered colonizers. No further testing performed.     Labs: Basic Metabolic Panel: Recent Labs  Lab 01/02/19 1106 01/02/19 2314 01/03/19 0644 01/04/19 0537  NA 137 140 139 139  K 4.4 4.1 3.9 3.8  CL 100 105 104 107  CO2 28 26 22 22   GLUCOSE 83 96 87 83  BUN 31* 34* 32* 25*  CREATININE 2.29* 2.57* 2.44* 1.92*  CALCIUM 9.2 9.6 8.9 8.8*   Liver Function Tests: Recent Labs  Lab 01/02/19 1106 01/03/19 0644  AST 97* 90*  ALT 53* 44  ALKPHOS 40 37*  BILITOT 0.9 0.9  PROT 6.6 6.0*  ALBUMIN 4.1 3.5   No results for input(s): LIPASE, AMYLASE in the last 168 hours. No results for input(s): AMMONIA in the last 168 hours. CBC: Recent Labs  Lab 01/02/19 1106 01/02/19 2314 01/03/19 0644 01/04/19 0537  WBC 7.9 7.2 6.6 6.3  NEUTROABS 5.6  --  4.3  --   HGB 8.5 aL* 8.2* 7.9* 8.0*  HCT 26.2 aL* 27.0* 27.5* 25.6*  MCV 76.8* 80.8 84.9 79.3*  PLT 132.0* 132* 113* 116*   Cardiac Enzymes: Recent Labs  Lab 01/03/19 0644  CKTOTAL 2,604*   BNP: BNP (last 3 results) No results for input(s): BNP in the last 8760 hours.  ProBNP (last 3 results) No results for input(s): PROBNP in the last 8760 hours.  CBG: Recent Labs  Lab 01/03/19 0251  GLUCAP 80       Signed:  Radene Gunning NP [facility] 01/04/2019, 10:35 AM

## 2019-01-04 NOTE — TOC Transition Note (Addendum)
Transition of Care [facility]) - CM/SW Discharge Note   Patient Details  Name: [name] MRN: [id number] Date of Birth: 02-12-1928  Transition of Care [facility]) CM/SW Contact:  Midge Minium RN, BSN, NCM-BC, ACM-RN [phone] Phone Number: 01/04/2019, 11:46 AM   Clinical Narrative:    CM following for transition of care needs. CM spoke to the patients daughter, [name], via phone to discuss the POC d/t the patient cognitive issues. Daughter stated the patient will transition to her home and she will be available to provide assistance with the patients ADLs and medications as needed. HHPT recommended post discharge, with the patients daughter agreeable. CMS HH Compare list discussed, with [facility] selected, or no preference if [facility] is unable to accept the referral. [place] referral discussed with Butch Penny RN, [facility] liaison, with the liaison to check if the referral can be accepted. Patients daughter indicated she will provide transportation home. Address: [address], [place] [address].   Addendum: 01/04/19 @ 1227-Zakary Kimura RNCM-CB from [place], [place]; the agency is unable to accept the [facility] referral. HHPT referral given to Adela Lank RN, [facility]; AVS updated.   Final next level of care: [place] Barriers to Discharge: No Barriers Identified   Patient Goals and CMS Choice Patient states their goals for this hospitalization and ongoing recovery are:: (POC discussed with the patients daughter d/t cognitive issues) CMS Medicare.gov Compare Post Acute Care list provided to:: Patient Represenative (must comment) Choice offered to / list presented to : Adult Children(Kaye Ronnald Ramp (daughter))   Discharge Plan and Services                DME Arranged: N/A DME Agency: NA       HH Arranged: PT [place] Agency: [place] ([facility]) Date [place]: 01/04/19 Time [place]: 1146 Representative spoke with at [place]:  Haematologist ([facility] [place] liaison)  Social Determinants of Health ([place]) Interventions     Readmission Risk Interventions No flowsheet data found.

## 2019-01-04 NOTE — Progress Notes (Signed)
Called and spoke with patients daughter regarding discharge.

## 2019-01-09 ENCOUNTER — Telehealth: Payer: Self-pay

## 2019-01-09 ENCOUNTER — Ambulatory Visit: Payer: Medicare Other | Admitting: Family Medicine

## 2019-01-09 DIAGNOSIS — E039 Hypothyroidism, unspecified: Secondary | ICD-10-CM

## 2019-01-09 DIAGNOSIS — Z79899 Other long term (current) drug therapy: Secondary | ICD-10-CM

## 2019-01-09 DIAGNOSIS — I10 Essential (primary) hypertension: Secondary | ICD-10-CM

## 2019-01-09 DIAGNOSIS — R29898 Other symptoms and signs involving the musculoskeletal system: Secondary | ICD-10-CM

## 2019-01-09 NOTE — Telephone Encounter (Signed)
[name] PT with [facility] left v/m; pt recently in hospital for acute kidney failure; [name] requesting [facility] PT orders for 1 x a wk for 1 wk;  2 x a wk for 2 wks; and 1 x a wk for 2 wks to strengthen legs, improve balance,home safety,fall prevention, and gait training. Also request skilled nursing referral for med mgt and hypertension. Pt also request rolator walker with 4 wheels on walker and brakes to be faxed to [place] [phone] to help pt improve mobility outdoors.

## 2019-01-09 NOTE — Telephone Encounter (Signed)
Okay to given verbal order for [facility] PT orders for 1 x a wk for 1 wk;  2 x a wk for 2 wks; and 1 x a wk for 2 wks to strengthen legs, improve balance,home safety,fall prevention, and gait training  Placed order for skilled nursing referral. Please fax rx for rolator walker.. order for DME placed.

## 2019-01-10 NOTE — Telephone Encounter (Signed)
Left message for Skeet Simmer giving verbal orders for [facility] PT orders for 1 x a wk for 1 wk; 2 x a wk for 2 wks; and 1 x a wk for 2 wks to strengthen legs, improve balance,home safety,fall prevention, and gait training.  I also advised that Dr. Diona Browner placed the referral for skilled nursing.  Sent Jolee Ewing and Darlina Guys a community message letting them know about DME order for Rolator walker.

## 2019-01-15 NOTE — Telephone Encounter (Signed)
Lori with [facility] called back and stated that they had to move the nursing up 1 week and wanted Dr Diona Browner to be aware.  Lori C/B #  [phone]

## 2019-01-19 ENCOUNTER — Other Ambulatory Visit: Payer: Self-pay

## 2019-01-19 ENCOUNTER — Encounter: Payer: Self-pay | Admitting: Family Medicine

## 2019-01-19 ENCOUNTER — Ambulatory Visit (INDEPENDENT_AMBULATORY_CARE_PROVIDER_SITE_OTHER): Payer: Medicare Other | Admitting: Family Medicine

## 2019-01-19 VITALS — BP 130/90 | HR 67 | Temp 98.7°F | Ht 65.5 in | Wt 182.2 lb

## 2019-01-19 DIAGNOSIS — M545 Low back pain: Secondary | ICD-10-CM

## 2019-01-19 DIAGNOSIS — N183 Chronic kidney disease, stage 3 (moderate): Secondary | ICD-10-CM | POA: Diagnosis not present

## 2019-01-19 DIAGNOSIS — I2510 Atherosclerotic heart disease of native coronary artery without angina pectoris: Secondary | ICD-10-CM

## 2019-01-19 DIAGNOSIS — D631 Anemia in chronic kidney disease: Secondary | ICD-10-CM | POA: Diagnosis not present

## 2019-01-19 DIAGNOSIS — I350 Nonrheumatic aortic (valve) stenosis: Secondary | ICD-10-CM

## 2019-01-19 DIAGNOSIS — F0390 Unspecified dementia without behavioral disturbance: Secondary | ICD-10-CM | POA: Diagnosis not present

## 2019-01-19 DIAGNOSIS — E039 Hypothyroidism, unspecified: Secondary | ICD-10-CM | POA: Diagnosis not present

## 2019-01-19 DIAGNOSIS — Z7902 Long term (current) use of antithrombotics/antiplatelets: Secondary | ICD-10-CM

## 2019-01-19 DIAGNOSIS — D509 Iron deficiency anemia, unspecified: Secondary | ICD-10-CM

## 2019-01-19 DIAGNOSIS — F331 Major depressive disorder, recurrent, moderate: Secondary | ICD-10-CM

## 2019-01-19 DIAGNOSIS — F411 Generalized anxiety disorder: Secondary | ICD-10-CM

## 2019-01-19 DIAGNOSIS — R911 Solitary pulmonary nodule: Secondary | ICD-10-CM

## 2019-01-19 DIAGNOSIS — K559 Vascular disorder of intestine, unspecified: Secondary | ICD-10-CM

## 2019-01-19 DIAGNOSIS — D696 Thrombocytopenia, unspecified: Secondary | ICD-10-CM

## 2019-01-19 DIAGNOSIS — E785 Hyperlipidemia, unspecified: Secondary | ICD-10-CM

## 2019-01-19 DIAGNOSIS — I6529 Occlusion and stenosis of unspecified carotid artery: Secondary | ICD-10-CM

## 2019-01-19 DIAGNOSIS — Z7982 Long term (current) use of aspirin: Secondary | ICD-10-CM

## 2019-01-19 DIAGNOSIS — Z87891 Personal history of nicotine dependence: Secondary | ICD-10-CM

## 2019-01-19 DIAGNOSIS — Z9181 History of falling: Secondary | ICD-10-CM

## 2019-01-19 DIAGNOSIS — R4189 Other symptoms and signs involving cognitive functions and awareness: Secondary | ICD-10-CM

## 2019-01-19 DIAGNOSIS — M1030 Gout due to renal impairment, unspecified site: Secondary | ICD-10-CM

## 2019-01-19 DIAGNOSIS — E559 Vitamin D deficiency, unspecified: Secondary | ICD-10-CM

## 2019-01-19 DIAGNOSIS — F03A0 Unspecified dementia, mild, without behavioral disturbance, psychotic disturbance, mood disturbance, and anxiety: Secondary | ICD-10-CM

## 2019-01-19 DIAGNOSIS — K439 Ventral hernia without obstruction or gangrene: Secondary | ICD-10-CM

## 2019-01-19 DIAGNOSIS — K219 Gastro-esophageal reflux disease without esophagitis: Secondary | ICD-10-CM

## 2019-01-19 DIAGNOSIS — M810 Age-related osteoporosis without current pathological fracture: Secondary | ICD-10-CM

## 2019-01-19 DIAGNOSIS — N12 Tubulo-interstitial nephritis, not specified as acute or chronic: Secondary | ICD-10-CM

## 2019-01-19 DIAGNOSIS — Q600 Renal agenesis, unilateral: Secondary | ICD-10-CM

## 2019-01-19 DIAGNOSIS — I739 Peripheral vascular disease, unspecified: Secondary | ICD-10-CM

## 2019-01-19 DIAGNOSIS — N179 Acute kidney failure, unspecified: Secondary | ICD-10-CM

## 2019-01-19 DIAGNOSIS — I129 Hypertensive chronic kidney disease with stage 1 through stage 4 chronic kidney disease, or unspecified chronic kidney disease: Secondary | ICD-10-CM | POA: Diagnosis not present

## 2019-01-19 DIAGNOSIS — I712 Thoracic aortic aneurysm, without rupture: Secondary | ICD-10-CM

## 2019-01-19 DIAGNOSIS — N812 Incomplete uterovaginal prolapse: Secondary | ICD-10-CM

## 2019-01-19 MED ORDER — ALLOPURINOL 100 MG PO TABS: 50.0000 mg | ORAL_TABLET | Freq: Every day | ORAL | 3 refills | Status: AC

## 2019-01-19 MED ORDER — CLOPIDOGREL BISULFATE 75 MG PO TABS: 75.0000 mg | ORAL_TABLET | Freq: Every day | ORAL | 3 refills | Status: AC

## 2019-01-19 MED ORDER — SERTRALINE HCL 50 MG PO TABS: 50.0000 mg | ORAL_TABLET | Freq: Every day | ORAL | 3 refills | Status: AC

## 2019-01-19 MED ORDER — METOPROLOL TARTRATE 25 MG PO TABS: ORAL_TABLET | ORAL | 3 refills | Status: AC

## 2019-01-19 MED ORDER — ISOSORBIDE MONONITRATE ER 30 MG PO TB24: 30.0000 mg | ORAL_TABLET | Freq: Two times a day (BID) | ORAL | 3 refills | Status: AC

## 2019-01-19 MED ORDER — ATORVASTATIN CALCIUM 80 MG PO TABS: 80.0000 mg | ORAL_TABLET | Freq: Every day | ORAL | 3 refills | Status: AC

## 2019-01-19 MED ORDER — COLESTIPOL HCL 1 G PO TABS: 2.0000 g | ORAL_TABLET | Freq: Two times a day (BID) | ORAL | 3 refills | Status: AC

## 2019-01-19 MED ORDER — LEVOTHYROXINE SODIUM 112 MCG PO TABS: 112.0000 ug | ORAL_TABLET | Freq: Every day | ORAL | 3 refills | Status: AC

## 2019-01-19 NOTE — Progress Notes (Signed)
VIRTUAL VISIT Due to national recommendations of social distancing due to [place] 19, a virtual visit is felt to be most appropriate for this patient at this time.   I connected with the patient on 01/19/19 at  3:00 PM EDT by virtual telehealth platform and verified that I am speaking with the correct person using two identifiers.   I discussed the limitations, risks, security and privacy concerns of performing an evaluation and management service by  virtual telehealth platform and the availability of in person appointments. I also discussed with the patient that there may be a patient responsible charge related to this service. The patient expressed understanding and agreed to proceed.  Patient location: Home Provider Location: [facility] Participants: Eliezer Lofts and Vickie Epley   Chief Complaint  Patient presents with  . Hospitalization Follow-up    History of Present Illness: 85 year old female presents for hospital follow up Admitted on 01/02/19 with weakness generalized, high TSH at 199 and ARF Cr2.5 up from 1.5. Discharged 6/25  Hospital course below: ARF improved with hydration down to 1.9 today, needs repeat be met in 1 week Noted to have severe hypothyroidism likely from poor compliance with medicines/inability to manage medications or inappropriate timing of Synthroid use limiting its absorption she was treated with IV Synthroid for 2 days and subsequently transitioned back to 112 mcg of Synthroid educated patient and daughter on taking is early in the morning 2 hours before breakfast she needs repeat thyroid function tests in 4 to 6 weeks, anticipate slow improvement in TSH down from 199. -In addition also noted to have iron deficiency anemia no overt bleeding Hemoccult was negative could have some degree of anemia from CKD and may have intermittent low-grade GI bleeding, given IV iron here, did not pursue GI/endoscopic evaluation at this time in the absence of active  bleeding, discussed with daughter to have PCP arrange nonurgent GI follow-up in 1 month and discussed risks benefits of pursuing further evaluation in the setting of advanced age and comorbidities, recommend starting iron at 1 week follow-up  Today daughter is at appt.  She has been set up for [facility].. RN, PT  Her daughter is now giving her the medications.  She plans on living with her daughter for now.  Her memory is getting better.. but still decreased from baseline. Keeping up with fluids. Sleeping well. Eating well, appetite excellent.   Wt Readings from Last 3 Encounters:  01/19/19 182 lb 4 oz (82.7 kg)  01/03/19 185 lb 13.6 oz (84.3 kg)  01/02/19 186 lb 8 oz (84.6 kg)    Iron def anemia: Down from 12 in 09/2018 to 8.5 at admission.  No blood in stool not   2012 colonscopy:   COVID 19 screen No recent travel or known exposure to [place] The patient denies respiratory symptoms of COVID 19 at this time.  The importance of social distancing was discussed today.   Review of Systems  Constitutional: Negative for chills and fever.  HENT: Negative for congestion and ear pain.   Eyes: Negative for pain and redness.  Respiratory: Negative for cough and shortness of breath.   Cardiovascular: Negative for chest pain, palpitations and leg swelling.  Gastrointestinal: Negative for abdominal pain, blood in stool, constipation, diarrhea, nausea and vomiting.  Genitourinary: Negative for dysuria.  Musculoskeletal: Negative for falls and myalgias.  Skin: Negative for rash.  Neurological: Negative for dizziness.  Psychiatric/Behavioral: Negative for depression. The patient is not nervous/anxious.  Past Medical History:  Diagnosis Date  . AAA (abdominal aortic aneurysm) (HCC)    Recurrent; s/p repair, now 3.8 cam; h/o ischemic bowel Oct 02, 2004  . Aortic stenosis    Moderate; Echo 12/09. moderate AS mean 19. AVA 0.88  . Basal cell carcinoma of nose 1980s  . Bruises easily   . CAD (coronary  artery disease)    s/p NSTEMI 1/07 with BMS to OM; cath 12/09 due to positive Myoview, LM ok. LAD 40%. LCX 80-90% mid. RCA chronically occluded with R --> L collats  . Carotid artery stenosis    R 60-79% L 0-39% (6/10) - stable  . Cataract   . CRI (chronic renal insufficiency)    Mild, Cr 1.3  . Depression 10-02-2004   With death of spouse  . Full dentures   . GERD (gastroesophageal reflux disease)   . HTN (hypertension)   . Hyperlipidemia    Increased LFTs on Vytorin. Changed to Lipitor.  . Hyperparathyroidism   . Hypothyroidism   . Incontinence   . Myocardial infarction ([place]) 10-02-2005  . PAD (peripheral artery disease) (HCC)    s/p iliac stents  . Renal artery stenosis (HCC)    Atrophic L kidney. Moderate blockage on R  . Ventral hernia    Moderate sized  . Vitamin D deficiency     reports that she quit smoking about 13 years ago. She has never used smokeless tobacco. She reports that she does not drink alcohol or use drugs.   Current Outpatient Medications:  .  acetaminophen (TYLENOL) 500 MG tablet, Take 1,000 mg by mouth 2 (two) times daily as needed for pain., Disp: , Rfl:  .  allopurinol (ZYLOPRIM) 100 MG tablet, Take 0.5 tablets (50 mg total) by mouth daily., Disp: 45 tablet, Rfl: 3 .  aspirin EC 81 MG tablet, Take 1 tablet (81 mg total) by mouth daily., Disp: , Rfl: 3 .  atorvastatin (LIPITOR) 80 MG tablet, Take 1 tablet (80 mg total) by mouth daily., Disp: 90 tablet, Rfl: 3 .  Cholecalciferol 1000 UNITS capsule, Take 3,000 Units by mouth daily. , Disp: , Rfl:  .  clopidogrel (PLAVIX) 75 MG tablet, Take 1 tablet (75 mg total) by mouth daily., Disp: 90 tablet, Rfl: 3 .  colestipol (COLESTID) 1 g tablet, Take 2 tablets (2 g total) by mouth 2 (two) times daily., Disp: 360 tablet, Rfl: 3 .  estradiol (ESTRACE VAGINAL) 0.1 MG/GM vaginal cream, Place 2 g vaginally 2 (two) times a week. Thursday and Sunday, Disp: , Rfl:  .  isosorbide mononitrate (IMDUR) 30 MG 24 hr tablet, TAKE 1 TABLET  TWICE A DAY, Disp: 180 tablet, Rfl: 0 .  levothyroxine (SYNTHROID, LEVOTHROID) 112 MCG tablet, Take 1 tablet (112 mcg total) by mouth daily., Disp: 90 tablet, Rfl: 3 .  metoprolol tartrate (LOPRESSOR) 25 MG tablet, TAKE ONE-HALF (1/2) TABLET TWICE A DAY, Disp: 90 tablet, Rfl: 3 .  sertraline (ZOLOFT) 50 MG tablet, Take 1 tablet (50 mg total) by mouth daily., Disp: 90 tablet, Rfl: 3   Observations/Objective: Blood pressure 130/90, pulse 67, temperature 98.7 F (37.1 C), temperature source Temporal, height 5' 5.5" (1.664 m), weight 182 lb 4 oz (82.7 kg), SpO2 93 %.  Physical Exam Constitutional:      General: She is not in acute distress.    Appearance: Normal appearance. She is well-developed. She is not ill-appearing or toxic-appearing.  HENT:     Head: Normocephalic.     Right Ear: Hearing, tympanic membrane, ear canal  and external ear normal. Tympanic membrane is not erythematous, retracted or bulging.     Left Ear: Hearing, tympanic membrane, ear canal and external ear normal. Tympanic membrane is not erythematous, retracted or bulging.     Nose: No mucosal edema or rhinorrhea.     Right Sinus: No maxillary sinus tenderness or frontal sinus tenderness.     Left Sinus: No maxillary sinus tenderness or frontal sinus tenderness.     Mouth/Throat:     Pharynx: Uvula midline.  Eyes:     General: Lids are normal. Lids are everted, no foreign bodies appreciated.     Conjunctiva/sclera: Conjunctivae normal.     Pupils: Pupils are equal, round, and reactive to light.  Neck:     Musculoskeletal: Normal range of motion and neck supple.     Thyroid: No thyroid mass or thyromegaly.     Vascular: No carotid bruit.     Trachea: Trachea normal.  Cardiovascular:     Rate and Rhythm: Normal rate and regular rhythm.     Pulses: Normal pulses.     Heart sounds: Normal heart sounds, S1 normal and S2 normal. No murmur. No friction rub. No gallop.   Pulmonary:     Effort: Pulmonary effort is normal.  No tachypnea or respiratory distress.     Breath sounds: Normal breath sounds. No decreased breath sounds, wheezing, rhonchi or rales.  Abdominal:     General: Bowel sounds are normal.     Palpations: Abdomen is soft.     Tenderness: There is no abdominal tenderness.  Skin:    General: Skin is warm and dry.     Findings: No rash.  Neurological:     Mental Status: She is alert. Mental status is at baseline.  Psychiatric:        Mood and Affect: Mood is not anxious or depressed.        Speech: Speech normal.        Behavior: Behavior normal. Behavior is cooperative.        Thought Content: Thought content normal.        Judgment: Judgment normal.      Assessment and Plan Hypothyroidism Now back on medication.. will re-eval in 4-6 weeks. Daughter assisting with med care.  Iron deficiency anemia Likely due to CKD, possible low grade GI loss.. follow up with GI nonurgently.  ARF (acute renal failure) ([place]) IMproving at discharge.. will re-eval.  Mild dementia ([place]) Recent worsening due to medication confusion.. now improving back to baseline mild dementia.     I discussed the assessment and treatment plan with the patient. The patient was provided an opportunity to ask questions and all were answered. The patient agreed with the plan and demonstrated an understanding of the instructions.   The patient was advised to call back or seek an in-person evaluation if the symptoms worsen or if the condition fails to improve as anticipated.     Eliezer Lofts, MD

## 2019-01-19 NOTE — Patient Instructions (Addendum)
Please stop at the lab to have labs drawn.  Start ferrous sulfate 325 mg daily.  Return stool cards x 3.

## 2019-01-20 LAB — BASIC METABOLIC PANEL WITH GFR
BUN/Creatinine Ratio: 20 (calc) (ref 6–22)
BUN: 37 mg/dL — ABNORMAL HIGH (ref 7–25)
CO2: 25 mmol/L (ref 20–32)
Calcium: 9.9 mg/dL (ref 8.6–10.4)
Chloride: 106 mmol/L (ref 98–110)
Creat: 1.84 mg/dL — ABNORMAL HIGH (ref 0.60–0.88)
GFR, Est African American: 28 mL/min/{1.73_m2} — ABNORMAL LOW (ref >=60)
GFR, Est Non African American: 24 mL/min/{1.73_m2} — ABNORMAL LOW (ref >=60)
Glucose, Bld: 99 mg/dL (ref 65–99)
Potassium: 4.6 mmol/L (ref 3.5–5.3)
Sodium: 141 mmol/L (ref 135–146)

## 2019-02-05 ENCOUNTER — Telehealth: Payer: Self-pay | Admitting: *Deleted

## 2019-02-05 NOTE — Telephone Encounter (Signed)
Patient's daughter [name] left a voicemail stating that her mom is having continuous muscle spasms in her legs. [name] stated that she is giving her extra strength tylenol every 8 hourswhich is not making it better. [name] is wanting to know what can be recommended over the counter or something natural like a lotion that may help with this.

## 2019-02-06 ENCOUNTER — Encounter: Payer: Self-pay | Admitting: Family Medicine

## 2019-02-06 ENCOUNTER — Ambulatory Visit (INDEPENDENT_AMBULATORY_CARE_PROVIDER_SITE_OTHER): Payer: Medicare Other | Admitting: Family Medicine

## 2019-02-06 ENCOUNTER — Ambulatory Visit
Admission: RE | Admit: 2019-02-06 | Discharge: 2019-02-06 | Disposition: A | Discharge status: Home / Self Care | Payer: Medicare Other | Source: Ambulatory Visit | Attending: Family Medicine | Admitting: Family Medicine

## 2019-02-06 ENCOUNTER — Other Ambulatory Visit: Payer: Self-pay

## 2019-02-06 VITALS — BP 140/92 | HR 68 | Temp 97.6°F | Ht 65.5 in | Wt 178.8 lb

## 2019-02-06 DIAGNOSIS — E038 Other specified hypothyroidism: Secondary | ICD-10-CM | POA: Diagnosis not present

## 2019-02-06 DIAGNOSIS — M79604 Pain in right leg: Secondary | ICD-10-CM | POA: Insufficient documentation

## 2019-02-06 NOTE — Patient Instructions (Signed)
Set up [place] right leg before leaving office with Rosaria Ferries.  Please stop at the lab to have labs drawn.

## 2019-02-06 NOTE — Telephone Encounter (Signed)
Appt made 7/28 @ 3:40 pm, pt aware

## 2019-02-06 NOTE — Assessment & Plan Note (Signed)
Re-eval free t3 and free t4 levels back on levothyroxine.

## 2019-02-06 NOTE — Assessment & Plan Note (Signed)
Eval for cause of cramp.  given age, inactivity and poor historian.. eval for DVT. Denies SOB.   She is on ASA and plavix for PAD.

## 2019-02-06 NOTE — Progress Notes (Signed)
Chief Complaint  Patient presents with  . Leg Pain    Right    History of Present Illness: HPI  85 year old female with PAD presents with her daughter for new onset leg cramps, right leg pain.  In last week she woke up with leg cramps in right calf... walking and tylenol helped some. Now pain constant in right calf. She has dementia and in unsteady on feet.  Daughter reports 5 weeks ago.. hit car running board on right anterior shin... has bruise and bump... now less swollen  Admitted to hospital on 6/23/ for confusion.  Bilateral legs were swollen given poor thyroid control ( was not compliant with meds, now is) She is relatively inactive.. does do PT. Right leg  stillsomewhat more swollen.   01/19/19: BMET stable TSH recheck due next week.  COVID 19 screen No recent travel or known exposure to COVID19 The patient denies respiratory symptoms of COVID 19 at this time.  The importance of social distancing was discussed today.   Review of Systems  Constitutional: Negative for chills and fever.  HENT: Negative for congestion and ear pain.   Eyes: Negative for pain and redness.  Respiratory: Negative for cough and shortness of breath.   Cardiovascular: Negative for chest pain, palpitations and leg swelling.  Gastrointestinal: Negative for abdominal pain, blood in stool, constipation, diarrhea, nausea and vomiting.  Genitourinary: Negative for dysuria.  Musculoskeletal: Negative for falls and myalgias.  Skin: Negative for rash.  Neurological: Negative for dizziness.  Psychiatric/Behavioral: Negative for depression. The patient is not nervous/anxious.       Past Medical History:  Diagnosis Date  . AAA (abdominal aortic aneurysm) (HCC)    Recurrent; s/p repair, now 3.8 cam; h/o ischemic bowel 11-02-2004  . Aortic stenosis    Moderate; Echo 12/09. moderate AS mean 19. AVA 0.88  . Basal cell carcinoma of nose 1980s  . Bruises easily   . CAD (coronary artery disease)    s/p NSTEMI  1/07 with BMS to OM; cath 12/09 due to positive Myoview, LM ok. LAD 40%. LCX 80-90% mid. RCA chronically occluded with R --> L collats  . Carotid artery stenosis    R 60-79% L 0-39% (6/10) - stable  . Cataract   . CRI (chronic renal insufficiency)    Mild, Cr 1.3  . Depression 2004/11/02   With death of spouse  . Full dentures   . GERD (gastroesophageal reflux disease)   . HTN (hypertension)   . Hyperlipidemia    Increased LFTs on Vytorin. Changed to Lipitor.  . Hyperparathyroidism   . Hypothyroidism   . Incontinence   . Myocardial infarction ([place]) 11-02-2005  . PAD (peripheral artery disease) (HCC)    s/p iliac stents  . Renal artery stenosis (HCC)    Atrophic L kidney. Moderate blockage on R  . Ventral hernia    Moderate sized  . Vitamin D deficiency     reports that she quit smoking about 13 years ago. She has never used smokeless tobacco. She reports that she does not drink alcohol or use drugs.   Current Outpatient Medications:  .  acetaminophen (TYLENOL) 500 MG tablet, Take 1,000 mg by mouth 2 (two) times daily as needed for pain., Disp: , Rfl:  .  allopurinol (ZYLOPRIM) 100 MG tablet, Take 0.5 tablets (50 mg total) by mouth daily., Disp: 45 tablet, Rfl: 3 .  aspirin EC 81 MG tablet, Take 1 tablet (81 mg total) by mouth daily., Disp: , Rfl: 3 .  atorvastatin (LIPITOR) 80 MG tablet, Take 1 tablet (80 mg total) by mouth daily., Disp: 90 tablet, Rfl: 3 .  Cholecalciferol 1000 UNITS capsule, Take 3,000 Units by mouth daily. , Disp: , Rfl:  .  clopidogrel (PLAVIX) 75 MG tablet, Take 1 tablet (75 mg total) by mouth daily., Disp: 90 tablet, Rfl: 3 .  colestipol (COLESTID) 1 g tablet, Take 2 tablets (2 g total) by mouth 2 (two) times daily., Disp: 360 tablet, Rfl: 3 .  estradiol (ESTRACE VAGINAL) 0.1 MG/GM vaginal cream, Place 2 g vaginally 2 (two) times a week. Thursday and Sunday, Disp: , Rfl:  .  isosorbide mononitrate (IMDUR) 30 MG 24 hr tablet, Take 1 tablet (30 mg total) by mouth 2 (two)  times daily., Disp: 180 tablet, Rfl: 3 .  levothyroxine (SYNTHROID) 112 MCG tablet, Take 1 tablet (112 mcg total) by mouth daily., Disp: 90 tablet, Rfl: 3 .  metoprolol tartrate (LOPRESSOR) 25 MG tablet, TAKE ONE-HALF (1/2) TABLET TWICE A DAY, Disp: 90 tablet, Rfl: 3 .  sertraline (ZOLOFT) 50 MG tablet, Take 1 tablet (50 mg total) by mouth daily., Disp: 90 tablet, Rfl: 3   Observations/Objective: Blood pressure (!) 140/92, pulse 68, temperature 97.6 F (36.4 C), temperature source Temporal, height 5' 5.5" (1.664 m), weight 178 lb 12 oz (81.1 kg), SpO2 98 %.  Physical Exam Constitutional:      General: She is not in acute distress.    Appearance: Normal appearance. She is well-developed. She is not ill-appearing or toxic-appearing.  HENT:     Head: Normocephalic.     Right Ear: Hearing, tympanic membrane, ear canal and external ear normal. Tympanic membrane is not erythematous, retracted or bulging.     Left Ear: Hearing, tympanic membrane, ear canal and external ear normal. Tympanic membrane is not erythematous, retracted or bulging.     Nose: No mucosal edema or rhinorrhea.     Right Sinus: No maxillary sinus tenderness or frontal sinus tenderness.     Left Sinus: No maxillary sinus tenderness or frontal sinus tenderness.     Mouth/Throat:     Pharynx: Uvula midline.  Eyes:     General: Lids are normal. Lids are everted, no foreign bodies appreciated.     Conjunctiva/sclera: Conjunctivae normal.     Pupils: Pupils are equal, round, and reactive to light.  Neck:     Musculoskeletal: Normal range of motion and neck supple.     Thyroid: No thyroid mass or thyromegaly.     Vascular: No carotid bruit.     Trachea: Trachea normal.  Cardiovascular:     Rate and Rhythm: Normal rate and regular rhythm.     Pulses:          Dorsalis pedis pulses are 1+ on the right side and 1+ on the left side.       Posterior tibial pulses are 1+ on the right side and 1+ on the left side.     Heart  sounds: Normal heart sounds, S1 normal and S2 normal. No murmur. No friction rub. No gallop.   Pulmonary:     Effort: Pulmonary effort is normal. No tachypnea or respiratory distress.     Breath sounds: Normal breath sounds. No decreased breath sounds, wheezing, rhonchi or rales.  Abdominal:     General: Bowel sounds are normal.     Palpations: Abdomen is soft.     Tenderness: There is no abdominal tenderness.  Musculoskeletal:     Right knee: Normal.  Right ankle: Normal.     Comments:  Contusion  and hematoma on right anterior shin,  1 plus edema right ankle, nonpitting  Positive Homan's sign   senile purpura  chronic sun change and dry skin bilateral legs.    Skin:    General: Skin is warm and dry.     Findings: No rash.  Neurological:     Mental Status: She is alert.  Psychiatric:        Mood and Affect: Mood is not anxious or depressed.        Speech: Speech normal.        Behavior: Behavior normal. Behavior is cooperative.        Thought Content: Thought content normal.        Judgment: Judgment normal.      Assessment and Plan      Eliezer Lofts, MD

## 2019-02-07 LAB — BASIC METABOLIC PANEL
BUN: 32 mg/dL — ABNORMAL HIGH (ref 6–23)
CO2: 27 mEq/L (ref 19–32)
Calcium: 9.6 mg/dL (ref 8.4–10.5)
Chloride: 107 mEq/L (ref 96–112)
Creatinine, Ser: 1.81 mg/dL — ABNORMAL HIGH (ref 0.40–1.20)
GFR: 26.31 mL/min — ABNORMAL LOW (ref >60.00)
Glucose, Bld: 103 mg/dL — ABNORMAL HIGH (ref 70–99)
Potassium: 4.7 mEq/L (ref 3.5–5.1)
Sodium: 141 mEq/L (ref 135–145)

## 2019-02-07 LAB — TSH: TSH: 2.26 u[IU]/mL (ref 0.35–4.50)

## 2019-02-07 LAB — T4, FREE: Free T4: 1.06 ng/dL (ref 0.60–1.60)

## 2019-02-07 LAB — MAGNESIUM: Magnesium: 1.9 mg/dL (ref 1.5–2.5)

## 2019-02-07 LAB — T3, FREE: T3, Free: 2.4 pg/mL (ref 2.3–4.2)

## 2019-02-12 ENCOUNTER — Other Ambulatory Visit: Payer: Medicare Other

## 2019-02-15 ENCOUNTER — Encounter: Payer: Self-pay | Admitting: Family Medicine

## 2019-02-15 ENCOUNTER — Ambulatory Visit (INDEPENDENT_AMBULATORY_CARE_PROVIDER_SITE_OTHER): Payer: Medicare Other | Admitting: Family Medicine

## 2019-02-15 DIAGNOSIS — D509 Iron deficiency anemia, unspecified: Secondary | ICD-10-CM

## 2019-02-15 DIAGNOSIS — N179 Acute kidney failure, unspecified: Secondary | ICD-10-CM | POA: Diagnosis not present

## 2019-02-15 DIAGNOSIS — E038 Other specified hypothyroidism: Secondary | ICD-10-CM | POA: Diagnosis not present

## 2019-02-15 DIAGNOSIS — R252 Cramp and spasm: Secondary | ICD-10-CM

## 2019-02-15 NOTE — Progress Notes (Signed)
VIRTUAL VISIT Due to national recommendations of social distancing due to [place] 19, a virtual visit is felt to be most appropriate for this patient at this time.   I connected with the patient on 02/15/19 at  2:00 PM EDT by virtual telehealth platform and verified that I am speaking with the correct person using two identifiers.   I discussed the limitations, risks, security and privacy concerns of performing an evaluation and management service by  virtual telehealth platform and the availability of in person appointments. I also discussed with the patient that there may be a patient responsible charge related to this service. The patient expressed understanding and agreed to proceed.  Patient location: Home Provider Location: [facility] Participants: Eliezer Lofts and Vickie Epley   Chief Complaint  Patient presents with  . 6 week F/U    History of Present Illness:   85 year old female presents for 6 week follow up on  ARF, iron def anemia and hypothyroid. 02/06/2019 labs showed nml electrolytes, stable kidney function.   Nml Mg. Thyroid stable. Now back on medication.  On ferrous sulfate 325 mg daily for iron def anemia.Marland Kitchen re-eval planned in 03/2019  stool cards:   Recent  Right leg pain: US dopplers neg for DVT  Still having leg cramps in both legs at night.  Still sore near hematoma on right calf,,d ecraseing in size.  no  Swelling in legs Tylenol helps with pain.  COVID 19 screen No recent travel or known exposure to COVID19 The patient denies respiratory symptoms of COVID 19 at this time.  The importance of social distancing was discussed today.   Review of Systems  Constitutional: Negative for chills and fever.  HENT: Negative for congestion and ear pain.   Eyes: Negative for pain and redness.  Respiratory: Negative for cough and shortness of breath.   Cardiovascular: Negative for chest pain, palpitations and leg swelling.  Gastrointestinal: Negative for  abdominal pain, blood in stool, constipation, diarrhea, nausea and vomiting.  Genitourinary: Negative for dysuria.  Musculoskeletal: Negative for falls and myalgias.  Skin: Negative for rash.  Neurological: Negative for dizziness.  Psychiatric/Behavioral: Negative for depression. The patient is not nervous/anxious.       Past Medical History:  Diagnosis Date  . AAA (abdominal aortic aneurysm) (HCC)    Recurrent; s/p repair, now 3.8 cam; h/o ischemic bowel 2004/10/19  . Aortic stenosis    Moderate; Echo 12/09. moderate AS mean 19. AVA 0.88  . Basal cell carcinoma of nose 1980s  . Bruises easily   . CAD (coronary artery disease)    s/p NSTEMI 1/07 with BMS to OM; cath 12/09 due to positive Myoview, LM ok. LAD 40%. LCX 80-90% mid. RCA chronically occluded with R --> L collats  . Carotid artery stenosis    R 60-79% L 0-39% (6/10) - stable  . Cataract   . CRI (chronic renal insufficiency)    Mild, Cr 1.3  . Depression Oct 19, 2004   With death of spouse  . Full dentures   . GERD (gastroesophageal reflux disease)   . HTN (hypertension)   . Hyperlipidemia    Increased LFTs on Vytorin. Changed to Lipitor.  . Hyperparathyroidism   . Hypothyroidism   . Incontinence   . Myocardial infarction ([place]) 10-19-2005  . PAD (peripheral artery disease) (HCC)    s/p iliac stents  . Renal artery stenosis (HCC)    Atrophic L kidney. Moderate blockage on R  . Ventral hernia  Moderate sized  . Vitamin D deficiency     reports that she quit smoking about 13 years ago. She has never used smokeless tobacco. She reports that she does not drink alcohol or use drugs.   Current Outpatient Medications:  .  acetaminophen (TYLENOL) 500 MG tablet, Take 1,000 mg by mouth 2 (two) times daily as needed for pain., Disp: , Rfl:  .  allopurinol (ZYLOPRIM) 100 MG tablet, Take 0.5 tablets (50 mg total) by mouth daily., Disp: 45 tablet, Rfl: 3 .  aspirin EC 81 MG tablet, Take 1 tablet (81 mg total) by mouth daily., Disp: , Rfl: 3 .   atorvastatin (LIPITOR) 80 MG tablet, Take 1 tablet (80 mg total) by mouth daily., Disp: 90 tablet, Rfl: 3 .  Cholecalciferol 1000 UNITS capsule, Take 3,000 Units by mouth daily. , Disp: , Rfl:  .  clopidogrel (PLAVIX) 75 MG tablet, Take 1 tablet (75 mg total) by mouth daily., Disp: 90 tablet, Rfl: 3 .  colestipol (COLESTID) 1 g tablet, Take 2 tablets (2 g total) by mouth 2 (two) times daily., Disp: 360 tablet, Rfl: 3 .  estradiol (ESTRACE VAGINAL) 0.1 MG/GM vaginal cream, Place 2 g vaginally 2 (two) times a week. Thursday and Sunday, Disp: , Rfl:  .  isosorbide mononitrate (IMDUR) 30 MG 24 hr tablet, Take 1 tablet (30 mg total) by mouth 2 (two) times daily., Disp: 180 tablet, Rfl: 3 .  levothyroxine (SYNTHROID) 112 MCG tablet, Take 1 tablet (112 mcg total) by mouth daily., Disp: 90 tablet, Rfl: 3 .  metoprolol tartrate (LOPRESSOR) 25 MG tablet, TAKE ONE-HALF (1/2) TABLET TWICE A DAY, Disp: 90 tablet, Rfl: 3 .  sertraline (ZOLOFT) 50 MG tablet, Take 1 tablet (50 mg total) by mouth daily., Disp: 90 tablet, Rfl: 3   Observations/Objective: There were no vitals taken for this visit. BP Readings from Last 3 Encounters:  02/06/19 (!) 140/92  01/19/19 130/90  01/04/19 (!) 142/87    Physical Exam  Physical Exam Constitutional:      General: The patient is not in acute distress. Pulmonary:     Effort: Pulmonary effort is normal. No respiratory distress.  Neurological:     Mental Status: The patient is alert and oriented to person, place, and time.  Psychiatric:        Mood and Affect: Mood normal.        Behavior: Behavior normal.   Assessment and Plan   ARF (acute renal failure) (HCC) Improved and GFR back at baseline.   Hypothyroidism Thyroid now controlled back on medication.  Iron deficiency anemia Return stool cards. Increase iron to 325 BID.Marland Kitchen. low iron may be contributing to leg pain. Re-eval in 4 weeks ( will be 3 month eval).  Leg cramps Electrolyte and fluid status  normal. May be duew to low ferritin.  Increase iron.  Work on walking some, gentle strething and tylenol prn.    I discussed the assessment and treatment plan with the patient. The patient was provided an opportunity to ask questions and all were answered. The patient agreed with the plan and demonstrated an understanding of the instructions.   The patient was advised to call back or seek an in-person evaluation if the symptoms worsen or if the condition fails to improve as anticipated.     Eliezer Lofts, MD

## 2019-02-15 NOTE — Progress Notes (Signed)
9/4 appointment daughter aware

## 2019-02-15 NOTE — Assessment & Plan Note (Signed)
Thyroid now controlled back on medication.

## 2019-02-15 NOTE — Assessment & Plan Note (Signed)
Return stool cards. Increase iron to 325 BID.Marland Kitchen. low iron may be contributing to leg pain. Re-eval in 4 weeks ( will be 3 month eval).

## 2019-02-15 NOTE — Assessment & Plan Note (Signed)
Improved and GFR back at baseline.

## 2019-02-15 NOTE — Patient Instructions (Addendum)
Can try to  Increase ferrous sulfate to 2 tabs daily.  Call to make a lab appt in 4 weeks for cbc and iron studies.  Tylenol for pain in legs.  Bring in stool test when able.

## 2019-02-15 NOTE — Assessment & Plan Note (Signed)
Electrolyte and fluid status normal. May be duew to low ferritin.  Increase iron.  Work on walking some, gentle strething and tylenol prn.

## 2019-02-25 DIAGNOSIS — F03A0 Unspecified dementia, mild, without behavioral disturbance, psychotic disturbance, mood disturbance, and anxiety: Secondary | ICD-10-CM | POA: Insufficient documentation

## 2019-02-25 DIAGNOSIS — F0390 Unspecified dementia without behavioral disturbance: Secondary | ICD-10-CM | POA: Insufficient documentation

## 2019-02-25 NOTE — Assessment & Plan Note (Signed)
IMproving at discharge.. will re-eval.

## 2019-02-25 NOTE — Assessment & Plan Note (Signed)
Likely due to CKD, possible low grade GI loss.. follow up with GI nonurgently.

## 2019-02-25 NOTE — Assessment & Plan Note (Signed)
Now back on medication.. will re-eval in 4-6 weeks. Daughter assisting with med care.

## 2019-02-25 NOTE — Assessment & Plan Note (Signed)
Recent worsening due to medication confusion.. now improving back to baseline mild dementia.

## 2019-03-12 ENCOUNTER — Emergency Department (HOSPITAL_COMMUNITY): Payer: Medicare Other

## 2019-03-12 ENCOUNTER — Other Ambulatory Visit: Payer: Self-pay

## 2019-03-12 ENCOUNTER — Emergency Department (HOSPITAL_COMMUNITY)
Admission: EM | Admit: 2019-03-12 | Discharge: 2019-03-12 | Disposition: A | Discharge status: Home / Self Care | Payer: Medicare Other | Attending: Emergency Medicine | Admitting: Emergency Medicine

## 2019-03-12 ENCOUNTER — Encounter (HOSPITAL_COMMUNITY): Payer: Self-pay

## 2019-03-12 DIAGNOSIS — M546 Pain in thoracic spine: Secondary | ICD-10-CM | POA: Diagnosis not present

## 2019-03-12 DIAGNOSIS — F0390 Unspecified dementia without behavioral disturbance: Secondary | ICD-10-CM | POA: Insufficient documentation

## 2019-03-12 DIAGNOSIS — E039 Hypothyroidism, unspecified: Secondary | ICD-10-CM | POA: Insufficient documentation

## 2019-03-12 DIAGNOSIS — R1010 Upper abdominal pain, unspecified: Secondary | ICD-10-CM

## 2019-03-12 DIAGNOSIS — Z87891 Personal history of nicotine dependence: Secondary | ICD-10-CM | POA: Diagnosis not present

## 2019-03-12 DIAGNOSIS — Z7982 Long term (current) use of aspirin: Secondary | ICD-10-CM | POA: Diagnosis not present

## 2019-03-12 DIAGNOSIS — K439 Ventral hernia without obstruction or gangrene: Secondary | ICD-10-CM | POA: Insufficient documentation

## 2019-03-12 DIAGNOSIS — I252 Old myocardial infarction: Secondary | ICD-10-CM | POA: Diagnosis not present

## 2019-03-12 DIAGNOSIS — I10 Essential (primary) hypertension: Secondary | ICD-10-CM | POA: Insufficient documentation

## 2019-03-12 DIAGNOSIS — M545 Low back pain: Secondary | ICD-10-CM | POA: Diagnosis not present

## 2019-03-12 DIAGNOSIS — I2510 Atherosclerotic heart disease of native coronary artery without angina pectoris: Secondary | ICD-10-CM | POA: Insufficient documentation

## 2019-03-12 LAB — URINALYSIS, ROUTINE W REFLEX MICROSCOPIC
Bilirubin Urine: NEGATIVE
Glucose, UA: NEGATIVE mg/dL
Ketones, ur: NEGATIVE mg/dL
Nitrite: NEGATIVE
Protein, ur: 30 mg/dL — AB
Specific Gravity, Urine: 1.016 (ref 1.005–1.030)
pH: 5 (ref 5.0–8.0)

## 2019-03-12 LAB — COMPREHENSIVE METABOLIC PANEL
ALT: 10 U/L (ref 0–44)
AST: 15 U/L (ref 15–41)
Albumin: 3.1 g/dL — ABNORMAL LOW (ref 3.5–5.0)
Alkaline Phosphatase: 58 U/L (ref 38–126)
Anion gap: 10 (ref 5–15)
BUN: 20 mg/dL (ref 8–23)
CO2: 22 mmol/L (ref 22–32)
Calcium: 8.5 mg/dL — ABNORMAL LOW (ref 8.9–10.3)
Chloride: 104 mmol/L (ref 98–111)
Creatinine, Ser: 1.72 mg/dL — ABNORMAL HIGH (ref 0.44–1.00)
GFR calc Af Amer: 30 mL/min — ABNORMAL LOW (ref >60)
GFR calc non Af Amer: 26 mL/min — ABNORMAL LOW (ref >60)
Glucose, Bld: 102 mg/dL — ABNORMAL HIGH (ref 70–99)
Potassium: 4.2 mmol/L (ref 3.5–5.1)
Sodium: 136 mmol/L (ref 135–145)
Total Bilirubin: 0.4 mg/dL (ref 0.3–1.2)
Total Protein: 5.8 g/dL — ABNORMAL LOW (ref 6.5–8.1)

## 2019-03-12 LAB — LACTIC ACID, PLASMA: Lactic Acid, Venous: 1.1 mmol/L (ref 0.5–1.9)

## 2019-03-12 LAB — CBC WITH DIFFERENTIAL/PLATELET
Abs Immature Granulocytes: 0.04 10*3/uL (ref 0.00–0.07)
Basophils Absolute: 0.1 10*3/uL (ref 0.0–0.1)
Basophils Relative: 1 %
Eosinophils Absolute: 0.2 10*3/uL (ref 0.0–0.5)
Eosinophils Relative: 2 %
HCT: 35.2 % — ABNORMAL LOW (ref 36.0–46.0)
Hemoglobin: 10.8 g/dL — ABNORMAL LOW (ref 12.0–15.0)
Immature Granulocytes: 1 %
Lymphocytes Relative: 19 %
Lymphs Abs: 1.6 10*3/uL (ref 0.7–4.0)
MCH: 28.1 pg (ref 26.0–34.0)
MCHC: 30.7 g/dL (ref 30.0–36.0)
MCV: 91.4 fL (ref 80.0–100.0)
Monocytes Absolute: 0.8 10*3/uL (ref 0.1–1.0)
Monocytes Relative: 9 %
Neutro Abs: 5.8 10*3/uL (ref 1.7–7.7)
Neutrophils Relative %: 68 %
Platelets: 152 10*3/uL (ref 150–400)
RBC: 3.85 MIL/uL — ABNORMAL LOW (ref 3.87–5.11)
RDW: 18.1 % — ABNORMAL HIGH (ref 11.5–15.5)
WBC: 8.4 10*3/uL (ref 4.0–10.5)
nRBC: 0 % (ref 0.0–0.2)

## 2019-03-12 LAB — LIPASE, BLOOD: Lipase: 34 U/L (ref 11–51)

## 2019-03-12 NOTE — ED Provider Notes (Signed)
[facility] EMERGENCY DEPARTMENT Provider Note   CSN: [id number] Arrival date & time: 03/12/19  1813     History   Chief Complaint Chief Complaint  Patient presents with   Abdominal Pain    HPI [name] is a 85 y.o. female.     HPI  85 year old female presents with abdominal pain.  History is from the patient and the daughter.  Since yesterday has had what seems to be intermittent abdominal pain.  Minimal to no pain right now.  No chest pain.  The pain seems to wrap around to her bilateral back.  No urinary symptoms, vomiting or fever.  Pain was at the site of her hernias but also in her back.  No further distention than typical from the hernia.  Past Medical History:  Diagnosis Date   AAA (abdominal aortic aneurysm) ([place])    Recurrent; s/p repair, now 3.8 cam; h/o ischemic bowel 15-Oct-2004   Aortic stenosis    Moderate; Echo 12/09. moderate AS mean 19. AVA 0.88   Basal cell carcinoma of nose 1980s   Bruises easily    CAD (coronary artery disease)    s/p NSTEMI 1/07 with BMS to OM; cath 12/09 due to positive Myoview, LM ok. LAD 40%. LCX 80-90% mid. RCA chronically occluded with R --> L collats   Carotid artery stenosis    R 60-79% L 0-39% (6/10) - stable   Cataract    CRI (chronic renal insufficiency)    Mild, Cr 1.3   Depression 10/15/04   With death of spouse   Full dentures    GERD (gastroesophageal reflux disease)    HTN (hypertension)    Hyperlipidemia    Increased LFTs on Vytorin. Changed to Lipitor.   Hyperparathyroidism    Hypothyroidism    Incontinence    Myocardial infarction [facility]) October 15, 2005   PAD (peripheral artery disease) (HCC)    s/p iliac stents   Renal artery stenosis (HCC)    Atrophic L kidney. Moderate blockage on R   Ventral hernia    Moderate sized   Vitamin D deficiency     Patient Active Problem List   Diagnosis Date Noted   Mild dementia ([place]) 02/25/2019   Leg cramps 02/15/2019   Right leg pain  02/06/2019   ARF (acute renal failure) ([place]) 01/03/2019   Iron deficiency anemia 01/03/2019   Pyelonephritis 09/26/2018   Osteoporosis 11/15/2017   GAD (generalized anxiety disorder) 05/24/2017   Major depressive disorder, recurrent episode, moderate ([place]) 05/25/2016   Counseling regarding end of life decision making 12/13/2014   Descending thoracic aortic aneurysm ([place]) 05/30/2014   Vitamin D deficiency 12/26/2012   Uterine prolapse 06/23/2012   Gout 06/06/2012   INSOMNIA, CHRONIC, MILD 07/10/2010   Carotid stenosis 11/01/2008   Aortic valve stenosis 09/25/2008   Abdominal aortic aneurysm ([place]) 09/24/2008   LOW BACK PAIN, MILD 07/24/2008   Coronary atherosclerosis 06/21/2008   VENTRAL HERNIA 05/16/2008   PULMONARY NODULE, RIGHT MIDDLE LOBE 02/01/2008   CYSTOCELE WITH INCOMPLETE UTERINE PROLAPSE 02/20/2007   Hypothyroidism 02/17/2007   Hyperlipidemia 02/17/2007   Essential hypertension 02/17/2007   PAD (peripheral artery disease) ([place]) 02/17/2007   GERD 02/17/2007   CKD (chronic kidney disease) stage 3, GFR 30-59 ml/min ([place]) 02/17/2007   ISCHEMIC COLITIS 05/19/2005    Past Surgical History:  Procedure Laterality Date   ABDOMINAL AORTIC ANEURYSM REPAIR  1995, 07/01/2005   ANGIOPLASTY  10-16-03 - 07/01/2005   APPENDECTOMY  1954   breast biospy  1996  BREAST SURGERY     CATARACT EXTRACTION, BILATERAL     CHOLECYSTECTOMY  07/01/2005   CHOLECYSTECTOMY  1987   COLONOSCOPY  07/07/2005   Colonic erosions, ulcers proctitis   COLONOSCOPY W/ ENDOSCOPIC [place]  07/01/2005   [place] kidney diffuse atrophy   CORONARY STENT PLACEMENT     CORONARY STENT PLACEMENT  07/2005   MI    HERNIA REPAIR  1997   HOSP  04/2005   ? abd. prob. mesenteric ischemia   HOSP  01/2006   Hosp for reflux (chest pain)   ILIAC ARTERY STENT  2005 - 07/01/2005   Stent placement, both legs - PVD in arteries   NSTEMI  08/13/2005   1/07 s/p stent in circumflex    TONSILLECTOMY  1943   UPPER GASTROINTESTINAL ENDOSCOPY  07/07/2005   Nml     OB History   No obstetric history on file.      Home Medications    Prior to Admission medications   Medication Sig Start Date End Date Taking? Authorizing Provider  acetaminophen (TYLENOL) 500 MG tablet Take 1,000 mg by mouth 2 (two) times daily as needed for pain.    [provider]  allopurinol (ZYLOPRIM) 100 MG tablet Take 0.5 tablets (50 mg total) by mouth daily. 01/19/19   Bedsole, Amy E, MD  aspirin EC 81 MG tablet Take 1 tablet (81 mg total) by mouth daily. 09/27/17   Minna Merritts, MD  atorvastatin (LIPITOR) 80 MG tablet Take 1 tablet (80 mg total) by mouth daily. 01/19/19   Bedsole, Amy E, MD  Cholecalciferol 1000 UNITS capsule Take 3,000 Units by mouth daily.     [provider]  clopidogrel (PLAVIX) 75 MG tablet Take 1 tablet (75 mg total) by mouth daily. 01/19/19   Bedsole, Amy E, MD  colestipol (COLESTID) 1 g tablet Take 2 tablets (2 g total) by mouth 2 (two) times daily. 01/19/19   Bedsole, Amy E, MD  estradiol (ESTRACE VAGINAL) 0.1 MG/GM vaginal cream Place 2 g vaginally 2 (two) times a week. Thursday and Sunday    [provider]  isosorbide mononitrate (IMDUR) 30 MG 24 hr tablet Take 1 tablet (30 mg total) by mouth 2 (two) times daily. 01/19/19   Bedsole, Amy E, MD  levothyroxine (SYNTHROID) 112 MCG tablet Take 1 tablet (112 mcg total) by mouth daily. 01/19/19   Bedsole, Amy E, MD  metoprolol tartrate (LOPRESSOR) 25 MG tablet TAKE ONE-HALF (1/2) TABLET TWICE A DAY 01/19/19   Bedsole, Amy E, MD  sertraline (ZOLOFT) 50 MG tablet Take 1 tablet (50 mg total) by mouth daily. 01/19/19   Jinny Sanders, MD    Family History Family History  Problem Relation Age of Onset   Heart attack Mother        MI   Cancer Mother    Heart disease Mother        heart attack   Stroke Mother    Cancer Father        throat   Heart attack Father        MI   Throat cancer Father     Heart disease Father        heart attack    Stroke Father    Cancer Sister        breast   Breast cancer Sister    Hyperlipidemia Sister    Hyperlipidemia Sister    Hypertension Sister    Hyperlipidemia Son    Colon cancer Neg  Hx     Social History Social History   Tobacco Use   Smoking status: Former Smoker    Quit date: 07/12/2005    Years since quitting: 13.6   Smokeless tobacco: Never Used   Tobacco comment: quit in 1997  Substance Use Topics   Alcohol use: No   Drug use: No     Allergies   Naproxen, Tramadol, Codeine, Morphine sulfate, and Sulfonamide derivatives   Review of Systems Review of Systems  Constitutional: Negative for fever.  Respiratory: Negative for shortness of breath.   Cardiovascular: Negative for chest pain.  Gastrointestinal: Positive for abdominal pain. Negative for constipation, diarrhea and vomiting.  Genitourinary: Negative for dysuria.  Musculoskeletal: Positive for back pain.  All other systems reviewed and are negative.    Physical Exam Updated Vital Signs BP 135/83    Pulse (!) 57    Temp 98.4 F (36.9 C) (Oral)    Resp 18    Ht 5\' 3"  (1.6 m)    Wt 77.1 kg    SpO2 93%    BMI 30.11 kg/m   Physical Exam Vitals signs and nursing note reviewed.  Constitutional:      General: She is not in acute distress.    Appearance: She is well-developed. She is not ill-appearing.  HENT:     Head: Normocephalic and atraumatic.     Right Ear: External ear normal.     Left Ear: External ear normal.     Nose: Nose normal.  Eyes:     General:        Right eye: No discharge.        Left eye: No discharge.  Cardiovascular:     Rate and Rhythm: Normal rate and regular rhythm.     Pulses:          Dorsalis pedis pulses are detected w/ Doppler on the right side and detected w/ Doppler on the left side.     Heart sounds: Murmur present.  Pulmonary:     Effort: Pulmonary effort is normal.     Breath sounds: Normal breath  sounds.  Abdominal:     Palpations: Abdomen is soft.     Tenderness: There is no abdominal tenderness.     Hernia: A hernia (2 large ventral hernias, superior in the abdomen. both are easily reducible) is present. Hernia is present in the ventral area.  Musculoskeletal:     Comments: Diffuse lower thoracic/lumbar tenderness, hard to localize  Skin:    General: Skin is warm and dry.  Neurological:     Mental Status: She is alert.  Psychiatric:        Mood and Affect: Mood is not anxious.      ED Treatments / Results  Labs (all labs ordered are listed, but only abnormal results are displayed) Labs Reviewed  COMPREHENSIVE METABOLIC PANEL - Abnormal; Notable for the following components:      Result Value   Glucose, Bld 102 (*)    Creatinine, Ser 1.72 (*)    Calcium 8.5 (*)    Total Protein 5.8 (*)    Albumin 3.1 (*)    GFR calc non Af Amer 26 (*)    GFR calc Af Amer 30 (*)    All other components within normal limits  CBC WITH DIFFERENTIAL/PLATELET - Abnormal; Notable for the following components:   RBC 3.85 (*)    Hemoglobin 10.8 (*)    HCT 35.2 (*)    RDW 18.1 (*)  All other components within normal limits  URINALYSIS, ROUTINE W REFLEX MICROSCOPIC - Abnormal; Notable for the following components:   Hgb urine dipstick LARGE (*)    Protein, ur 30 (*)    Leukocytes,Ua TRACE (*)    Bacteria, UA RARE (*)    All other components within normal limits  URINE CULTURE  LIPASE, BLOOD  LACTIC ACID, PLASMA    EKG EKG Interpretation  Date/Time:  Monday March 12 2019 18:23:45 EDT Ventricular Rate:  66 PR Interval:    QRS Duration: 89 QT Interval:  425 QTC Calculation: 446 R Axis:   40 Text Interpretation:  Sinus rhythm Borderline short PR interval nonspecific ST/T changes similar to June 2020 Confirmed by Sherwood Gambler [id number]) on 03/12/2019 6:49:37 PM   Radiology Ct Abdomen Pelvis Wo Contrast  Result Date: 03/12/2019 CLINICAL DATA:  Abdominal distention. UPPER  abdominal and epigastric pain, 10/10. Pain radiates into the back. History of AAA and 2 hernias. EXAM: CT ABDOMEN AND PELVIS WITHOUT CONTRAST TECHNIQUE: Multidetector CT imaging of the abdomen and pelvis was performed following the standard protocol without IV contrast. COMPARISON:  CT of the abdomen and pelvis on 09/24/2018 FINDINGS: Lower chest: There are subpleural reticular changes at the lung bases, RIGHT greater than LEFT. No focal consolidations or pleural effusions. The descending thoracic aorta is aneurysmal, 5.4 x 5.2 centimeters (previously 5.0 x 5.2 centimeters). Heart size is normal. Dense atherosclerotic calcification of the coronary arteries and mitral annulus. Hepatobiliary: The liver is unremarkable in appearance. Stable biliary duct dilatation, common bile duct measuring 13 millimeters. Cholecystectomy. Pancreas: Atrophic pancreas otherwise normal. Spleen: Normal in size without focal abnormality. Adrenals/Urinary Tract: Adrenal glands are normal in appearance. Atrophic LEFT kidney. Numerous bilateral renal cysts, stable in appearance. No hydronephrosis. The bladder and visualized portion of the urethra are normal. Stomach/Bowel: The stomach is normal in appearance. Small bowel loops are present within a RIGHT anterior abdominal wall hernia in not associated with obstruction. There are numerous colonic diverticula. Loops of colon are identified within a LEFT anterior abdominal wall hernia, not associated with obstruction. The appendix is well seen and has a normal appearance. Vascular/Lymphatic: Tortuous, aneurysmal abdominal aorta. Previous aortobifemoral stent graft and LEFT common iliac stent. Infrarenal aneurysm is 4.1 centimeters, previously 3.9 centimeters. LEFT common iliac artery aneurysm is 5.0 centimeters, previously 2.5 centimeters. LEFT external iliac stent. Reproductive: Uterus is present. No adnexal mass. Other: No free pelvic fluid. Anterior abdominal wall is notable for complex  anterior abdominal wall hernias, containing mesenteric fat and, containing loops of large bowel and small bowel. Musculoskeletal: No acute or significant osseous findings. IMPRESSION: 1. No evidence for acute abnormality of the abdomen or pelvis. 2. Descending thoracic aortic aneurysm, now 5.4 centimeters, previously 5.2 centimeters. 3. Abdominal aortic aneurysm now measures maximum of 4.1 centimeters, previously 3.9 centimeters. 4. 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 5. Coronary artery disease. 6. Status post cholecystectomy. 7. Stable biliary duct dilatation. 8. Stable appearance of bilateral renal cysts. 9. Atrophic LEFT kidney. 10. Colonic diverticulosis. 11. Complex anterior abdominal wall hernias containing loops of large and small bowel. No evidence for obstruction. Electronically Signed   By: Nolon Nations M.D.   On: 03/12/2019 21:02    Procedures Procedures (including critical care time)  Medications Ordered in ED Medications - No data to display   Initial Impression / Assessment and Plan / ED Course  I have reviewed the triage vital signs and the nursing notes.  Pertinent labs &  imaging results that were available during my care of the patient were reviewed by me and considered in my medical decision making (see chart for details).        Patient does not have any abdominal pain in the emergency department.  She is well-appearing.  No obvious UTI.  Labs are near baseline.  CT does show slightly enlarging aneurysm but no obvious fluid collection.  I had discussion with daughter and given her well appearance, we both agree that contrast, which could injure her solitary kidney function, is not indicated at this time given the lower suspicion for ruptured AAA.  We discussed that there is some limitation with this, but at this point she could go home and return if symptoms worsen.  She does have the large hernias but no evidence  of incarceration at this time.  Discharged home with return precautions.  Final Clinical Impressions(s) / ED Diagnoses   Final diagnoses:  Upper abdominal pain    ED Discharge Orders    None       Sherwood Gambler, MD 03/12/19 [phone]

## 2019-03-12 NOTE — ED Notes (Signed)
Left DP pulse found with dopler, unable to locate right DP. Dr. Regenia Skeeter aware.

## 2019-03-12 NOTE — ED Triage Notes (Signed)
Pt arrives EMS from home with c/o upper abdominal/epigastric pain 10/10 radiating to back. 'Pt has AAA and 2 hernias. Alert and oriented x 4.

## 2019-03-12 NOTE — Discharge Instructions (Signed)
If you develop worsening, continued, or recurrent abdominal pain, uncontrolled vomiting, fever, chest or back pain, or any other new/concerning symptoms then return to the ER for evaluation.  

## 2019-03-13 ENCOUNTER — Telehealth: Payer: Self-pay | Admitting: Family Medicine

## 2019-03-13 LAB — URINE CULTURE: Culture: <10000 — AB

## 2019-03-13 NOTE — Telephone Encounter (Signed)
Patient's daughter called to cancel patient's lab appointment on 03/16/19.  Patient was taken to the hospital yesterday and had lab work done.

## 2019-03-13 NOTE — Telephone Encounter (Signed)
Noted  

## 2019-03-16 ENCOUNTER — Other Ambulatory Visit: Payer: Medicare Other

## 2019-03-28 ENCOUNTER — Other Ambulatory Visit: Payer: Self-pay | Admitting: *Deleted

## 2019-03-28 ENCOUNTER — Telehealth: Payer: Self-pay | Admitting: *Deleted

## 2019-03-28 DIAGNOSIS — I739 Peripheral vascular disease, unspecified: Secondary | ICD-10-CM

## 2019-03-28 NOTE — Telephone Encounter (Signed)
Call from patient's daughter. Patient is having increase in leg pain right greater than left. Coolness to both feet. Recent visit to ER. (see ER note). Doppler pulses only by provider. Recent venous doppler RLE by PCP r/0 DVT. Due to medical history wanted patient to be seen by Dr. Oneida Alar. Will schedule Vascular studies in office and face to face visit with Dr. Oneida Alar. Daughter to be called with appts.

## 2019-04-12 ENCOUNTER — Other Ambulatory Visit: Payer: Self-pay

## 2019-04-12 DIAGNOSIS — I6521 Occlusion and stenosis of right carotid artery: Secondary | ICD-10-CM

## 2019-04-12 DIAGNOSIS — I723 Aneurysm of iliac artery: Secondary | ICD-10-CM

## 2019-04-12 DIAGNOSIS — I739 Peripheral vascular disease, unspecified: Secondary | ICD-10-CM

## 2019-04-16 ENCOUNTER — Telehealth: Payer: Self-pay | Admitting: Family Medicine

## 2019-04-16 NOTE — Telephone Encounter (Signed)
Let her know that her mother's hemoglobin was much better.. 2 points higher. Iron was not checked on 03/11/2019.  We can use tramadol for pain or tylenol with codeine... has she ever tried either of these?

## 2019-04-16 NOTE — Telephone Encounter (Signed)
Rollene Fare, Can you please call Ulis Rias to she what her concerns are?

## 2019-04-16 NOTE — Telephone Encounter (Signed)
Spoke to patient's daughter and was advised that she would like for Dr. Diona Browner to look at her mom's blood work that was done at [facility] ER back in August. Patient's daughter wants to know if there is any improvement in her iron?  Patient's daughter stated that her mom is having a lot of pain in her legs and lower back and  tylenol is really not helping. Patient has an appointment this Thursday 04/13/2019 with a vein and vascular doctor.  Patient's daughter wants to know if Dr. Diona Browner can give her something other than tylenol for her pain? Patient's daughter is aware that Dr. Diona Browner is out of the office today. Pharmacy [facility]

## 2019-04-16 NOTE — Telephone Encounter (Signed)
Patient's Daughter Ulis Rias is requesting a call back.  She stated she left a message with someone last week and did not hear back from anyone. I did not see a message.   Ulis Rias stated that patient was recently in the hospital and she has some concerns she would like to leave with the nurse and get sent over to Dr Diona Browner to see what she advice them to do.   C/B # [id number].

## 2019-04-16 NOTE — Telephone Encounter (Signed)
Ulis Rias notified as instructed by telephone.  She states Ms. Asplund doesn't do well with codeine so she would like to try the Tramadol.

## 2019-04-16 NOTE — Telephone Encounter (Signed)
Left message on voicemail for Ulis Rias to call back.

## 2019-04-17 NOTE — Telephone Encounter (Signed)
Ulis Rias notified as instructed by telephone.  She would like to try either the Vicodin or Percocet, whichever one Dr. Rometta Emery thinks might be gentler on Ms. Haro's stomach.  She would like to try one just for a week for breakthrough pain to see how Ms. Scarpitti does.

## 2019-04-17 NOTE — Telephone Encounter (Signed)
Let pt's daughter know I missed tramadol on allergy list... looks ike in past cause nausea.  Codien and morphine on allergy list too... so likely vicodin and percocet out as well... has she tolerated either of these in past?    or we could try gabapentin for chronic pain at [facility]?   If too many options and daughter with questions.. make an appt.

## 2019-04-19 ENCOUNTER — Other Ambulatory Visit: Payer: Self-pay

## 2019-04-19 ENCOUNTER — Ambulatory Visit (HOSPITAL_COMMUNITY)
Admission: RE | Admit: 2019-04-19 | Discharge: 2019-04-19 | Disposition: A | Discharge status: Home / Self Care | Payer: Medicare Other | Source: Ambulatory Visit | Attending: Family | Admitting: Family

## 2019-04-19 ENCOUNTER — Encounter (HOSPITAL_COMMUNITY): Payer: Self-pay

## 2019-04-19 ENCOUNTER — Ambulatory Visit: Payer: Medicare Other | Admitting: Vascular Surgery

## 2019-04-19 DIAGNOSIS — I739 Peripheral vascular disease, unspecified: Secondary | ICD-10-CM

## 2019-04-19 DIAGNOSIS — I723 Aneurysm of iliac artery: Secondary | ICD-10-CM

## 2019-04-19 DIAGNOSIS — I6521 Occlusion and stenosis of right carotid artery: Secondary | ICD-10-CM

## 2019-04-19 MED ORDER — HYDROCODONE-ACETAMINOPHEN 5-300 MG PO TABS: 0.5000 | ORAL_TABLET | Freq: Three times a day (TID) | ORAL | 0 refills | Status: AC | PRN

## 2019-04-19 NOTE — Addendum Note (Signed)
Addended byEliezer Lofts E on: 04/23/2019 12:05 PM   Modules accepted: Orders

## 2019-04-19 NOTE — Telephone Encounter (Addendum)
Spoke with [name].  Ms. Swanigan passed away this morning.  I called [facility] and cancelled Rx for pain medicine.

## 2019-04-19 NOTE — Telephone Encounter (Signed)
Call  I sent in vicodin to use up to every eight hours as needed. Given she has had nausea with similar medications in this family ( codeine , morphine) it is highly like that she will have same side effects to this.  I would start with only a 1/2 tab and , use only daily if really uncomfortable.  The three times daily is more for the pharmacy given they will not give more than a 5 day supply at first as she is not a hospice patient.

## 2019-04-19 NOTE — Telephone Encounter (Signed)
Aww. I will miss her.

## 2019-05-06 IMAGING — DX DG ANKLE COMPLETE 3+V*L*
3 series · 3 of 3 positions shown · non-contrast
Comparison: None.

CLINICAL DATA: Fall with foot and ankle pain

EXAM:
LEFT ANKLE COMPLETE - 3+ VIEW

[ankle ap]
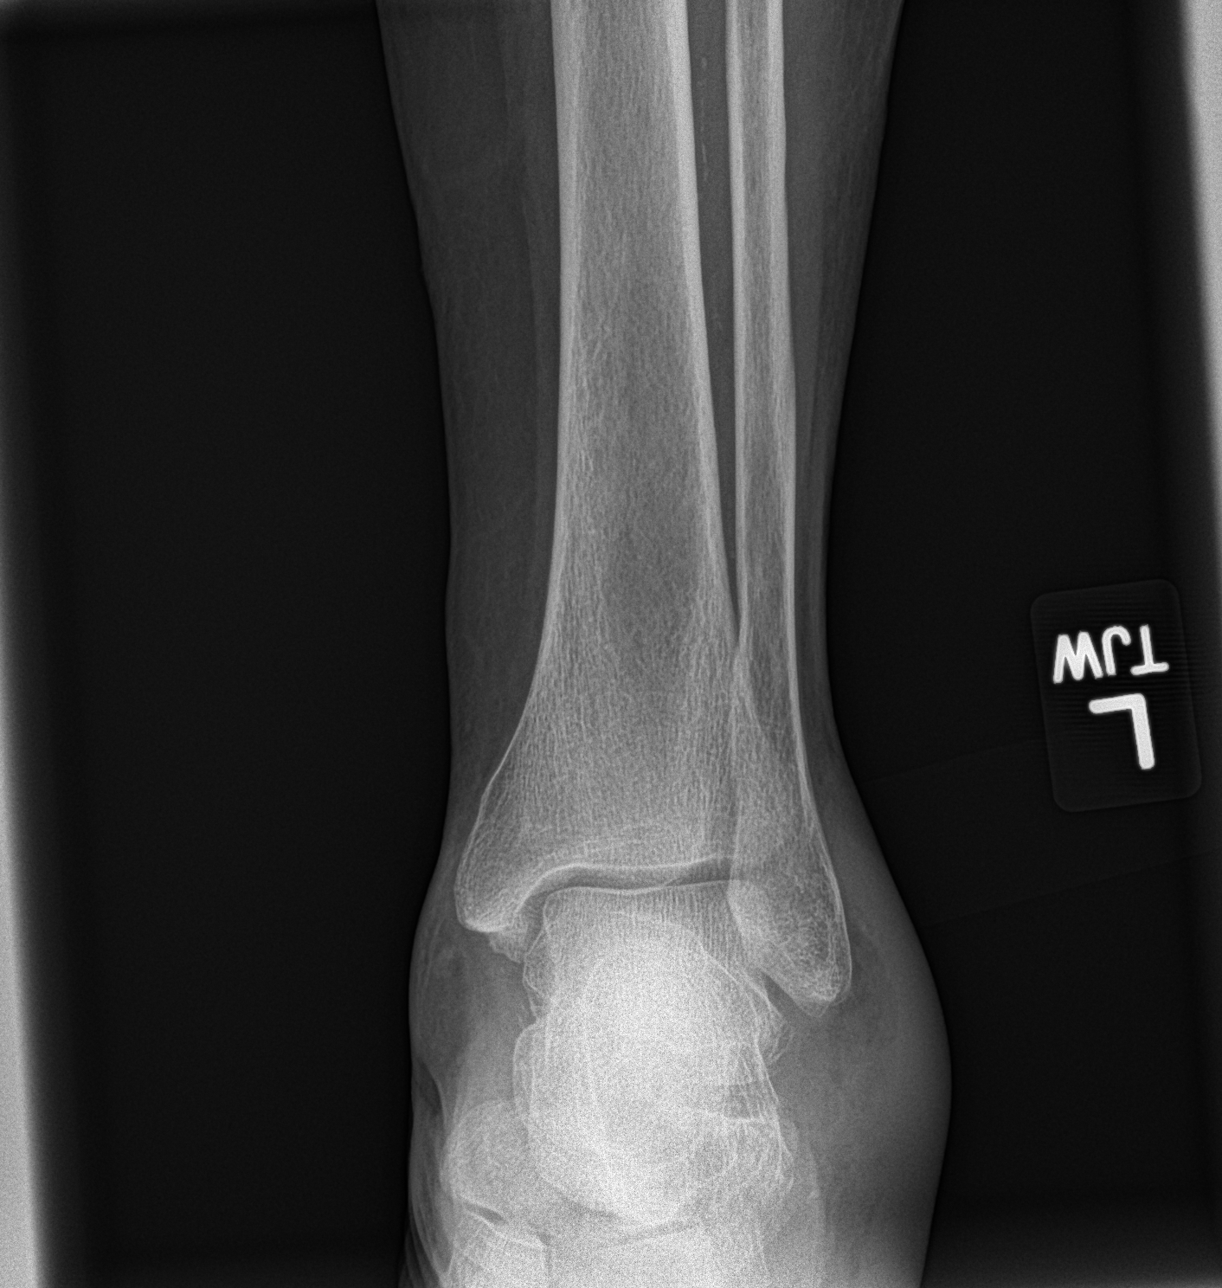

[ankle obl]
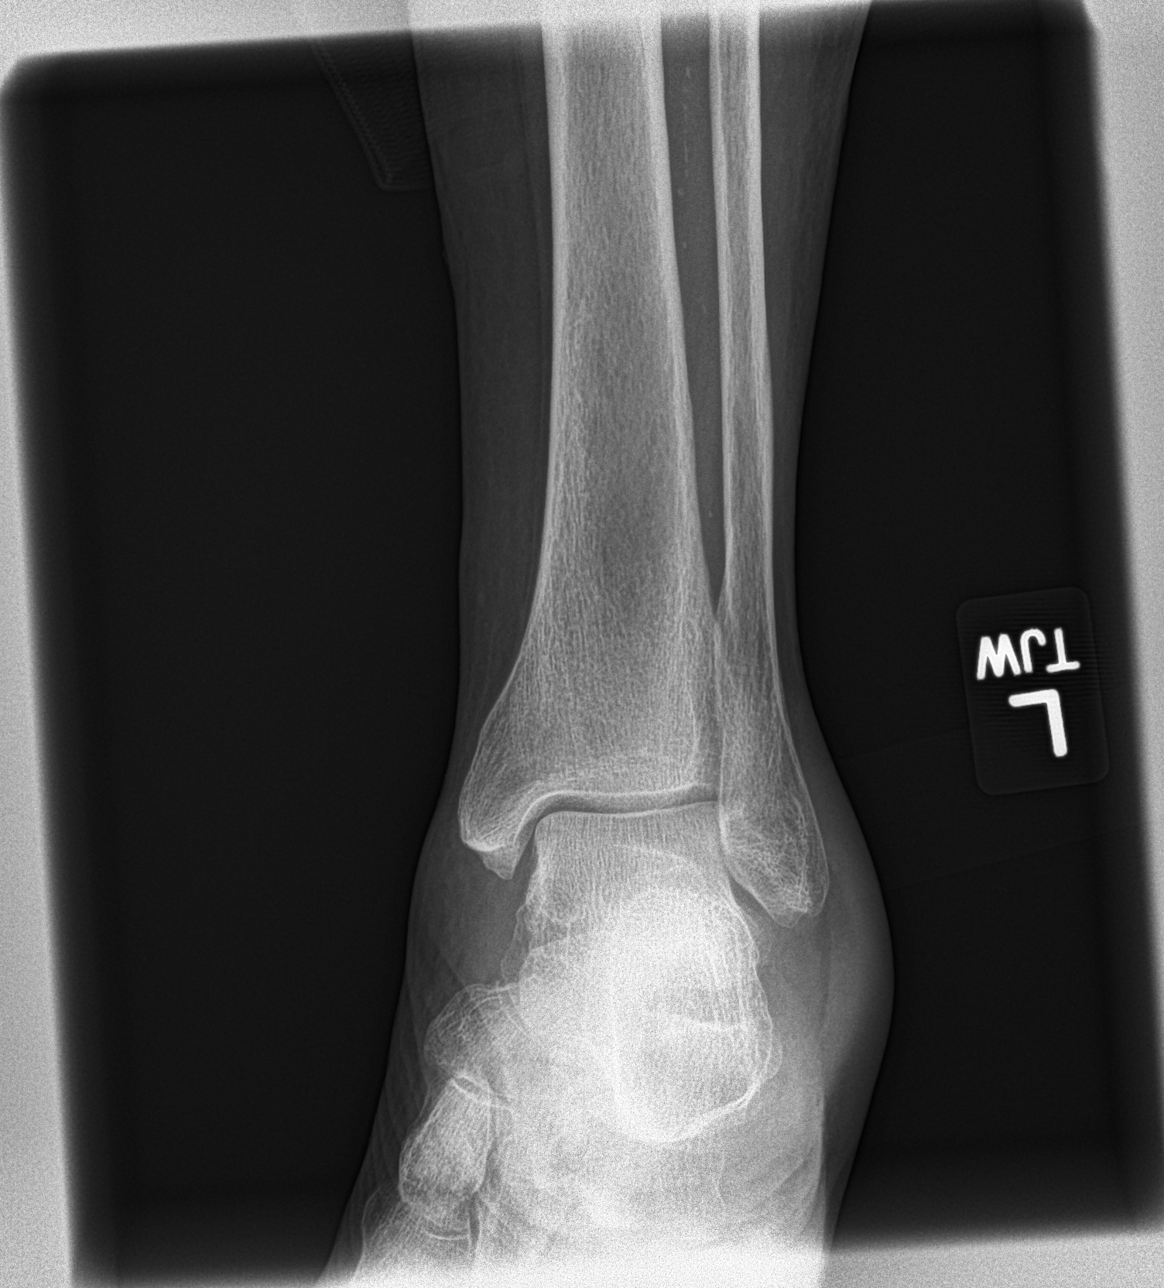

[ankle lat]
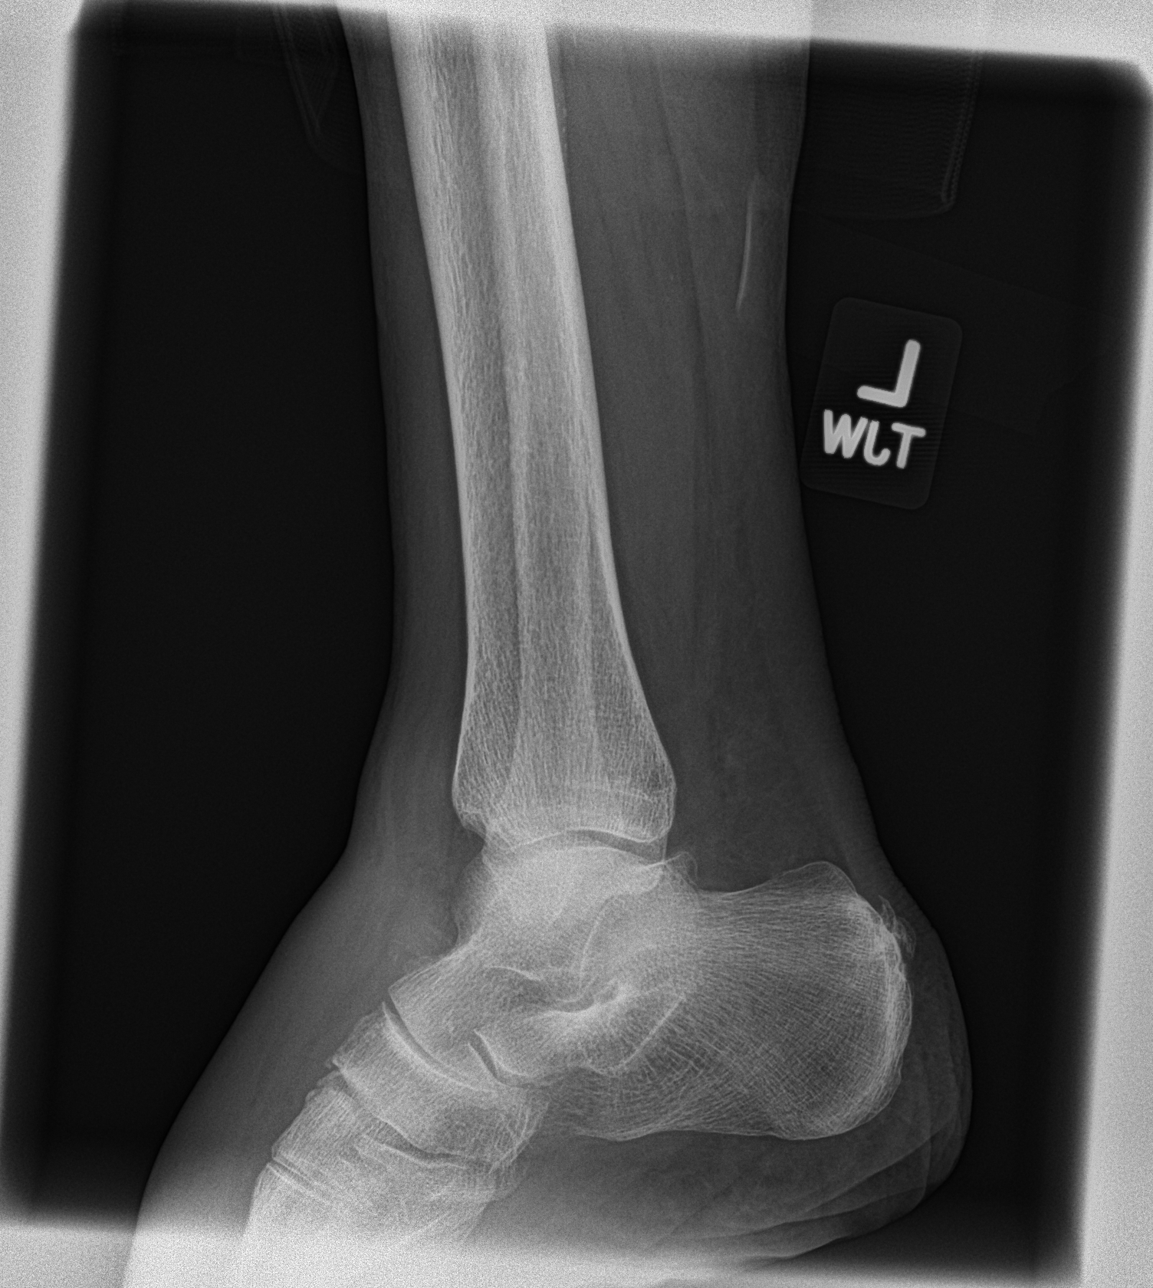

[3 of 3 positions shown; findings below may reference images not displayed]

FINDINGS: Lateral soft tissue swelling. Possible avulsion off the dorsal
anterior cortex of the talus. Large amount of dorsal soft tissue
swelling. There may be additional small avulsion off the anterior,
dorsal navicular.
IMPRESSION: Large amount of dorsal soft tissue swelling and lateral soft tissue
swelling. Possible cortical avulsion injuries involving the dorsal
anterior talus and dorsal anterior navicular.

## 2019-05-13 DEATH — deceased

## 2020-08-01 IMAGING — CT CT ABDOMEN AND PELVIS WITHOUT CONTRAST
2 of 4 series · 14 of 46 positions shown, 16 images · non-contrast
Comparison: CT, 01/03/2009
COMPARISON: CT, 01/03/2009

Addendum:
CLINICAL DATA: Pt states she fell months ago and is now having low
back pain radiating to hernia site. Hx ventral hernia, renal artery
stenosis, GERD, chronic renal insufficiency, aortic stenosis, AAA,
and iliac artery stent, hernia repair, cholecystectomy,
appendectomy, and abdominal aortic aneurysm repair.

EXAM:
CT ABDOMEN AND PELVIS WITHOUT CONTRAST
TECHNIQUE: Multidetector CT imaging of the abdomen and pelvis was performed
following the standard protocol without IV contrast.

[Series 3: a/p w/o 5mm · axial · non-contrast · 0.90mm/px · z∈[-791,-426]mm · 11 of 87 slices shown, 13 images]
[im 7/87  soft-tissue]
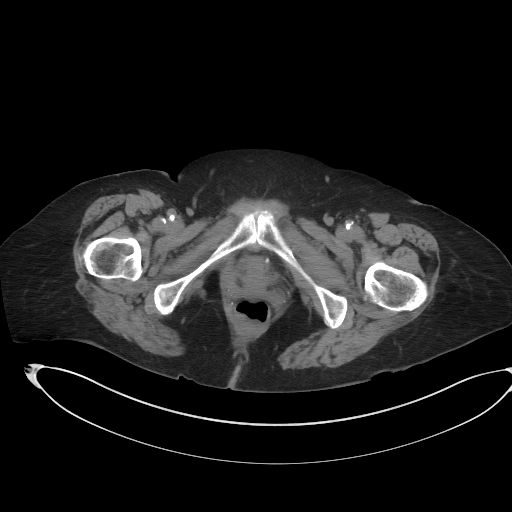
[im 7/87  bone]
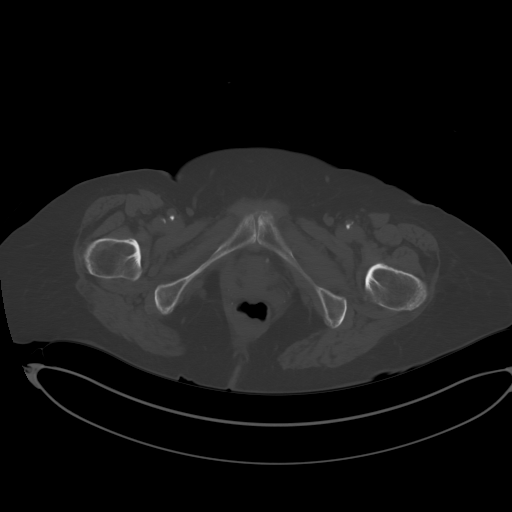
[im 14/87  soft-tissue]
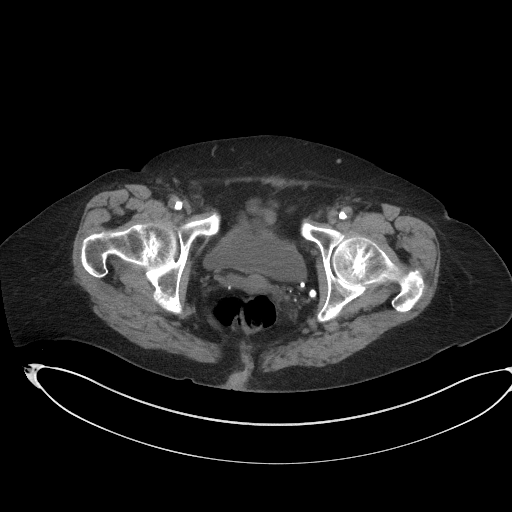
[im 21/87  soft-tissue]
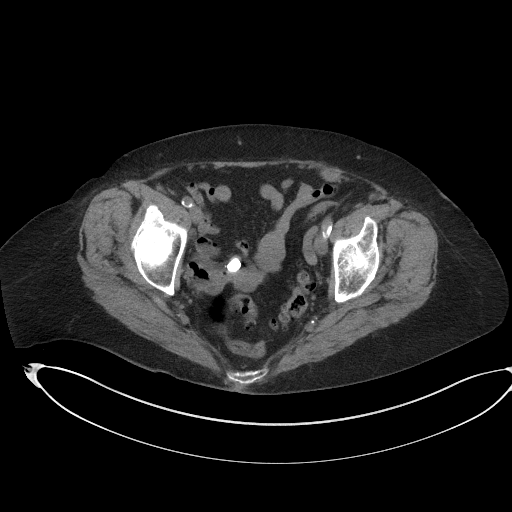
[im 28/87  soft-tissue]
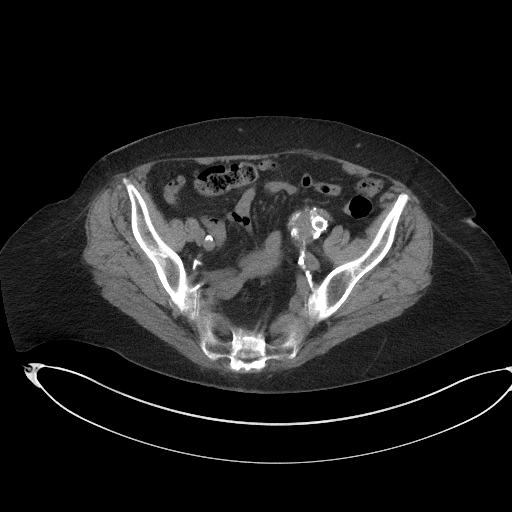
[im 35/87  soft-tissue]
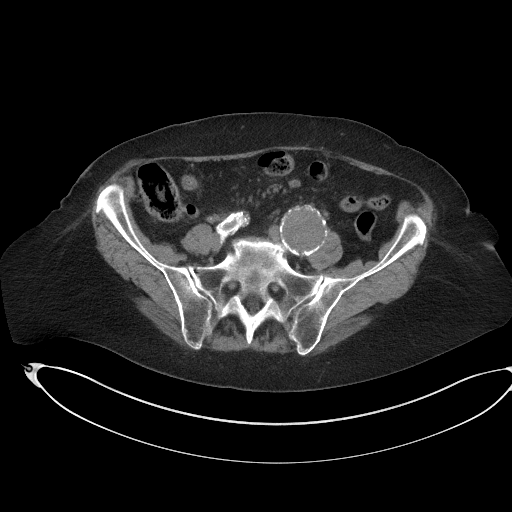
[im 45/87  soft-tissue]
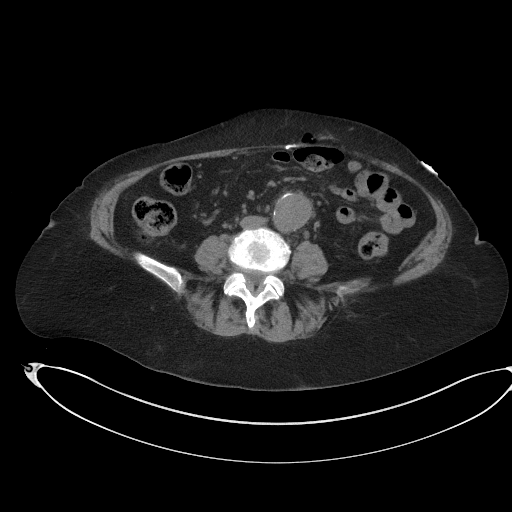
[im 52/87  soft-tissue]
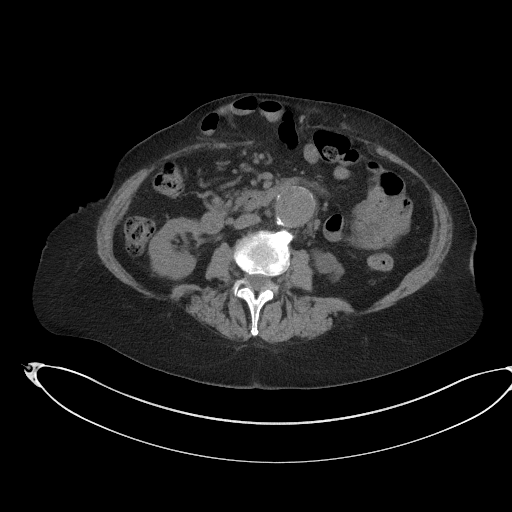
[im 59/87  soft-tissue]
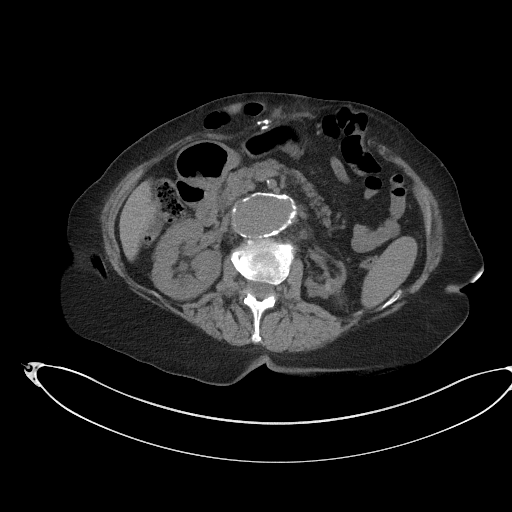
[im 66/87  soft-tissue]
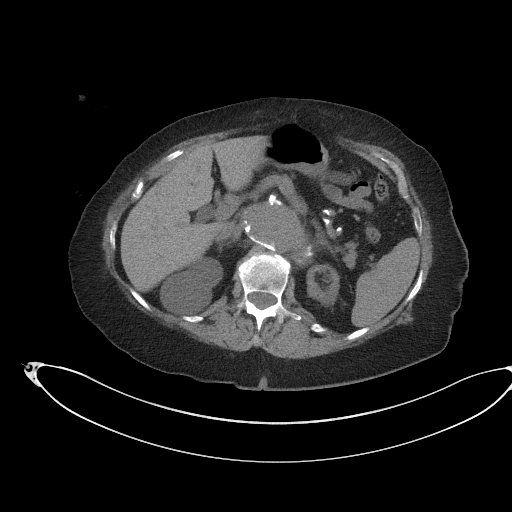
[im 66/87  bone]
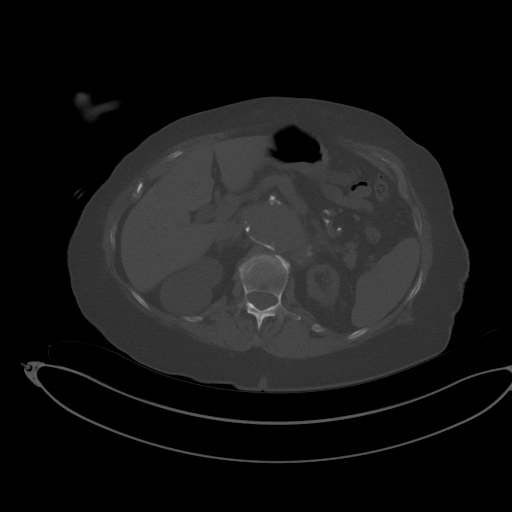
[im 73/87  soft-tissue]
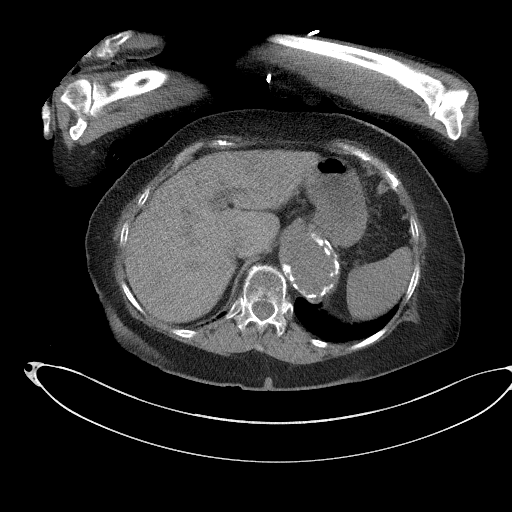
[im 80/87  soft-tissue]
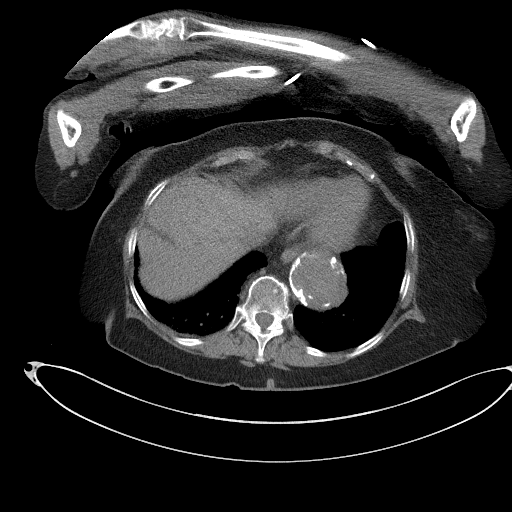

[Series 6: a/p w/o cor · coronal · non-contrast · 0.85mm/px · 3 of 137 slices shown]
[im 46/137  soft-tissue]
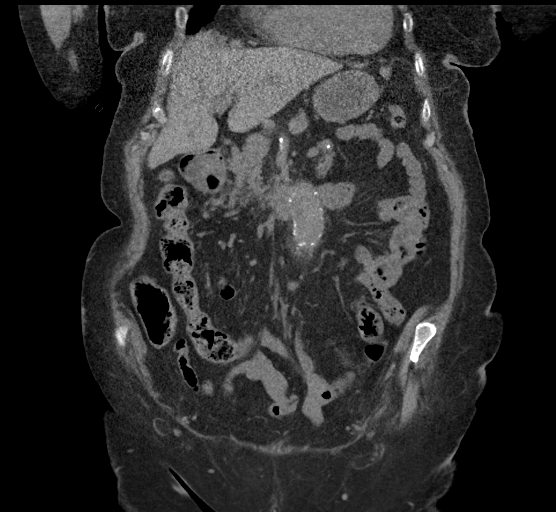
[im 61/137  soft-tissue]
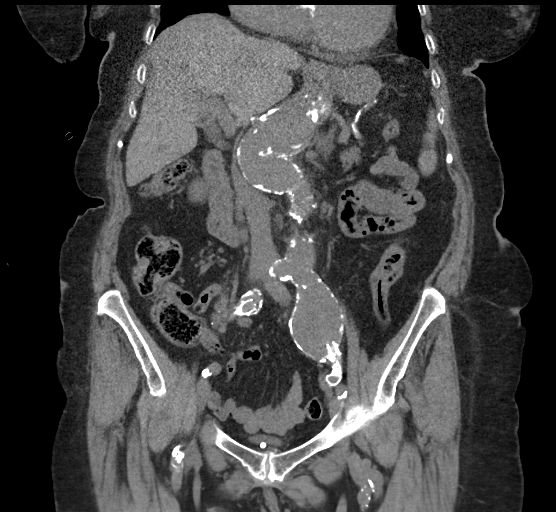
[im 76/137  soft-tissue]
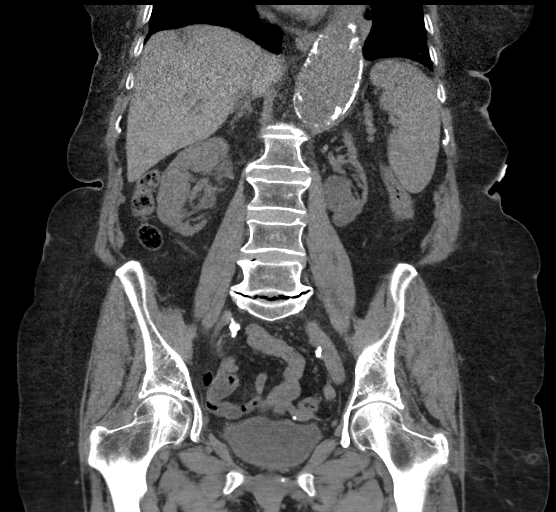

[14 of 46 positions shown; findings below may reference images not displayed]

FINDINGS: Lower chest: No acute abnormality.

Hepatobiliary: No focal liver abnormality is seen. Status post
cholecystectomy. Mild chronic dilation of the common bile duct 12
mm.

Pancreas: Unremarkable. No pancreatic ductal dilatation or
surrounding inflammatory changes.

Spleen: Normal in size without focal abnormality.

Adrenals/Urinary Tract: No adrenal masses.

Marked atrophy of the left kidney. Bilateral homogeneous
low-attenuation renal masses, largest on the right, upper pole,
cm, enlarged on the left, 2.8 cm, posterolateral midpole, all
consistent with cysts, increased in size prior CT. No renal stones.
No hydronephrosis. Normal ureters. Normal bladder.

Stomach/Bowel: Stomach is unremarkable. Small bowel and portions of
the transverse colon enters a wide-based ventral hernia. No evidence
of obstruction, incarceration or strangulation. Small bowel and
colon normal caliber. No wall thickening. No adjacent inflammation.

Vascular/Lymphatic: Diffuse aortic ectasia. The descending thoracic
aorta measures 5.2 x 5.0 cm, 4.4 x 4.4 cm on the prior CT. Immediate
infrarenal abdominal aorta measures 3.9 cm, 3.3 cm previously. Mid
infrarenal abdominal aorta measures 3.7 cm, 2.8 cm previously. Left
common iliac artery is dilated 24.8 cm, previously 2.7 cm. No
evidence of aneurysm rupture.

No enlarged lymph nodes.

Reproductive: Uterus and bilateral adnexa are unremarkable.

Other: No ascites.

Musculoskeletal: No fracture or acute finding. No osteoblastic or
osteolytic lesions.
IMPRESSION: 1. No acute findings.
2. Diffuse aortic ectasia from the lower thoracic aorta through the
bifurcation. Aorta is is more dilated than it was on the prior CT as
detailed above. There is also an aneurysm of the left common iliac
artery, increased in size from prior CT. No evidence of aneurysm
rupture. Status of the aortic aneurysm repair is not well evaluated
on this unenhanced exam.
3. Large ventral hernia containing small bowel and a portion of
transverse colon, increased in size prior CT. No evidence of
obstruction, incarceration or strangulation.
4. Marked atrophy of the left kidney similar to the prior CT.
Presumed bilateral renal cysts have increased in size.

ADDENDUM:
The following is a correction to a typographical error within the
"Vascular/Lymphatic" portion of the "Findings" section of the above
report:

Left common iliac artery is dilated to 4.8 cm (not 24.8 cm),
previously, 2.7 cm.

Above brought to me attention by Dr. Simonian (Vascular Surgery) at
the time of this addendum.

*** End of Addendum ***
FINDINGS: Lower chest: No acute abnormality.

Hepatobiliary: No focal liver abnormality is seen. Status post
cholecystectomy. Mild chronic dilation of the common bile duct 12
mm.

Pancreas: Unremarkable. No pancreatic ductal dilatation or
surrounding inflammatory changes.

Spleen: Normal in size without focal abnormality.

Adrenals/Urinary Tract: No adrenal masses.

Marked atrophy of the left kidney. Bilateral homogeneous
low-attenuation renal masses, largest on the right, upper pole,
cm, enlarged on the left, 2.8 cm, posterolateral midpole, all
consistent with cysts, increased in size prior CT. No renal stones.
No hydronephrosis. Normal ureters. Normal bladder.

Stomach/Bowel: Stomach is unremarkable. Small bowel and portions of
the transverse colon enters a wide-based ventral hernia. No evidence
of obstruction, incarceration or strangulation. Small bowel and
colon normal caliber. No wall thickening. No adjacent inflammation.

Vascular/Lymphatic: Diffuse aortic ectasia. The descending thoracic
aorta measures 5.2 x 5.0 cm, 4.4 x 4.4 cm on the prior CT. Immediate
infrarenal abdominal aorta measures 3.9 cm, 3.3 cm previously. Mid
infrarenal abdominal aorta measures 3.7 cm, 2.8 cm previously. Left
common iliac artery is dilated 24.8 cm, previously 2.7 cm. No
evidence of aneurysm rupture.

No enlarged lymph nodes.

Reproductive: Uterus and bilateral adnexa are unremarkable.

Other: No ascites.

Musculoskeletal: No fracture or acute finding. No osteoblastic or
osteolytic lesions.
IMPRESSION: 1. No acute findings.
2. Diffuse aortic ectasia from the lower thoracic aorta through the
bifurcation. Aorta is is more dilated than it was on the prior CT as
detailed above. There is also an aneurysm of the left common iliac
artery, increased in size from prior CT. No evidence of aneurysm
rupture. Status of the aortic aneurysm repair is not well evaluated
on this unenhanced exam.
3. Large ventral hernia containing small bowel and a portion of
transverse colon, increased in size prior CT. No evidence of
obstruction, incarceration or strangulation.
4. Marked atrophy of the left kidney similar to the prior CT.
Presumed bilateral renal cysts have increased in size.

## 2021-11-19 ENCOUNTER — Telehealth: Payer: Self-pay | Admitting: Cardiovascular Disease

## 2021-11-19 NOTE — Telephone Encounter (Signed)
3 attempts to schedule fu appt from recall list.   Deleting recall.
